# Patient Record
Sex: Female | Born: 1940 | Race: White | Hispanic: No | Smoking: Former smoker
Health system: Southern US, Community
[De-identification: ages and names within clinical notes are randomized; demographics above are authoritative.]

## PROBLEM LIST (undated history)

## (undated) DIAGNOSIS — F32A Depression, unspecified: Secondary | ICD-10-CM

## (undated) DIAGNOSIS — E785 Hyperlipidemia, unspecified: Secondary | ICD-10-CM

## (undated) DIAGNOSIS — M810 Age-related osteoporosis without current pathological fracture: Secondary | ICD-10-CM

## (undated) DIAGNOSIS — F329 Major depressive disorder, single episode, unspecified: Secondary | ICD-10-CM

## (undated) HISTORY — PX: ROTATOR CUFF REPAIR: SHX139

## (undated) HISTORY — PX: KNEE SURGERY: SHX244

## (undated) HISTORY — PX: BREAST SURGERY: SHX581

## (undated) HISTORY — PX: ABDOMINAL HYSTERECTOMY: SHX81

---

## 2013-08-31 ENCOUNTER — Emergency Department (HOSPITAL_COMMUNITY): Payer: Medicare Other

## 2013-08-31 ENCOUNTER — Emergency Department (HOSPITAL_COMMUNITY)
Admission: EM | Admit: 2013-08-31 | Discharge: 2013-08-31 | Disposition: A | Discharge status: Home / Self Care | Payer: Medicare Other | Attending: Emergency Medicine | Admitting: Emergency Medicine

## 2013-08-31 ENCOUNTER — Encounter (HOSPITAL_COMMUNITY): Payer: Self-pay | Admitting: Emergency Medicine

## 2013-08-31 DIAGNOSIS — W1809XA Striking against other object with subsequent fall, initial encounter: Secondary | ICD-10-CM | POA: Insufficient documentation

## 2013-08-31 DIAGNOSIS — M810 Age-related osteoporosis without current pathological fracture: Secondary | ICD-10-CM | POA: Insufficient documentation

## 2013-08-31 DIAGNOSIS — F3289 Other specified depressive episodes: Secondary | ICD-10-CM | POA: Insufficient documentation

## 2013-08-31 DIAGNOSIS — S20219A Contusion of unspecified front wall of thorax, initial encounter: Secondary | ICD-10-CM | POA: Insufficient documentation

## 2013-08-31 DIAGNOSIS — E785 Hyperlipidemia, unspecified: Secondary | ICD-10-CM | POA: Insufficient documentation

## 2013-08-31 DIAGNOSIS — F329 Major depressive disorder, single episode, unspecified: Secondary | ICD-10-CM | POA: Insufficient documentation

## 2013-08-31 DIAGNOSIS — Y9389 Activity, other specified: Secondary | ICD-10-CM | POA: Insufficient documentation

## 2013-08-31 DIAGNOSIS — Y92009 Unspecified place in unspecified non-institutional (private) residence as the place of occurrence of the external cause: Secondary | ICD-10-CM | POA: Insufficient documentation

## 2013-08-31 DIAGNOSIS — Z87891 Personal history of nicotine dependence: Secondary | ICD-10-CM | POA: Insufficient documentation

## 2013-08-31 DIAGNOSIS — S20212A Contusion of left front wall of thorax, initial encounter: Secondary | ICD-10-CM

## 2013-08-31 DIAGNOSIS — W010XXA Fall on same level from slipping, tripping and stumbling without subsequent striking against object, initial encounter: Secondary | ICD-10-CM | POA: Insufficient documentation

## 2013-08-31 HISTORY — DX: Age-related osteoporosis without current pathological fracture: M81.0

## 2013-08-31 HISTORY — DX: Depression, unspecified: F32.A

## 2013-08-31 HISTORY — DX: Hyperlipidemia, unspecified: E78.5

## 2013-08-31 HISTORY — DX: Major depressive disorder, single episode, unspecified: F32.9

## 2013-08-31 MED ORDER — HYDROCODONE-ACETAMINOPHEN 5-325 MG PO TABS: 1.0000 | ORAL_TABLET | ORAL | Status: DC | PRN

## 2013-08-31 NOTE — ED Notes (Signed)
Pt from home c/o of side/rib pain on the left and shortness of breath after falling this past Friday.  Pt also has a bruise on the right upper arm.  When pt places pressure against the area pain in somewhat relieved.  Pt in NAD, A&O.

## 2013-08-31 NOTE — ED Provider Notes (Signed)
CSN: [id number]     Arrival date & time 08/31/13  4098 History   First MD Initiated Contact with Patient 08/31/13 0746     Chief Complaint  Patient presents with  . Flank Pain   (Consider location/radiation/quality/duration/timing/severity/associated sxs/prior Treatment) HPI Comments: Patient presents to the ER with complaints of left rib pain. Patient reports that she fell on her deck 2 days ago. Patient reports that she tripped over a clothes, no head injury or loss of consciousness. Patient says that she hit her left chest wall area on a flower pot. She has had sharp pain in the area ever since. She reports that it hurts worse when she breathes or coughs. Stabilizing the area with pressure with her hands sometimes helps the pain. No neck or back pain. She is not short of breath, although pain is limiting her breathing effort.  Patient is a 72 y.o. female presenting with flank pain.  Flank Pain    Past Medical History  Diagnosis Date  . Hyperlipemia   . Osteoporosis   . Depression    Past Surgical History  Procedure Laterality Date  . Abdominal hysterectomy    . Breast surgery     History reviewed. No pertinent family history. History  Substance Use Topics  . Smoking status: Former Smoker    Quit date: 02/28/2013  . Smokeless tobacco: Never Used  . Alcohol Use: No   OB History   Grav Para Term Preterm Abortions TAB SAB Ect Mult Living                 Review of Systems  Musculoskeletal:       Rib pain    Allergies  Review of patient's allergies indicates no known allergies.  Home Medications   Current Outpatient Rx  Name  Route  Sig  Dispense  Refill  . atorvastatin (LIPITOR) 40 MG tablet               . citalopram (CELEXA) 20 MG tablet                BP 168/79  Pulse 68  Temp(Src) 98 F (36.7 C) (Oral)  Resp 24  SpO2 100% Physical Exam  Constitutional: She is oriented to person, place, and time. She appears well-developed and well-nourished. No  distress.  HENT:  Head: Normocephalic and atraumatic.  Right Ear: Hearing normal.  Left Ear: Hearing normal.  Nose: Nose normal.  Mouth/Throat: Oropharynx is clear and moist and mucous membranes are normal.  Eyes: Conjunctivae and EOM are normal. Pupils are equal, round, and reactive to light.  Neck: Normal range of motion. Neck supple.  Cardiovascular: Regular rhythm, S1 normal and S2 normal.  Exam reveals no gallop and no friction rub.   No murmur heard. Pulmonary/Chest: Effort normal and breath sounds normal. No respiratory distress. She exhibits no tenderness.    Abdominal: Soft. Normal appearance and bowel sounds are normal. There is no hepatosplenomegaly. There is no tenderness. There is no rebound, no guarding, no tenderness at McBurney's point and negative Murphy's sign. No hernia.  Musculoskeletal: Normal range of motion.       Right shoulder: She exhibits normal range of motion, no swelling and no deformity.       Right hip: Normal.       Left hip: Normal.       Cervical back: Normal.       Thoracic back: Normal.       Lumbar back: Normal.  Arms: Neurological: She is alert and oriented to person, place, and time. She has normal strength. No cranial nerve deficit or sensory deficit. Coordination normal. GCS eye subscore is 4. GCS verbal subscore is 5. GCS motor subscore is 6.  Skin: Skin is warm, dry and intact. Bruising noted. No rash noted. No cyanosis.     Psychiatric: She has a normal mood and affect. Her speech is normal and behavior is normal. Thought content normal.    ED Course  Procedures (including critical care time) Labs Review Labs Reviewed - No data to display Imaging Review Dg Ribs Unilateral W/chest Left  08/31/2013   CLINICAL DATA:  Right rib injury.  , a worsening pain.  EXAM: LEFT RIBS AND CHEST - 3+ VIEW  COMPARISON:  None.  FINDINGS: No fracture or other bone lesions are seen involving the ribs. There is no evidence of pneumothorax or pleural  effusion. Both lungs are clear. Heart size and mediastinal contours are within normal limits.  IMPRESSION: No acute osseous injury of the left ribs.   Electronically Signed   By: Elige Ko   On: 08/31/2013 08:35    EKG Interpretation   None       MDM  Diagnosis: Chest wall contusion  Patient presents to the ER for evaluation of pain in the left ribs after a fall. Patient's lungs are clear, no concern for pneumonia or pneumothorax. X-ray of the ribs and chest likewise was unremarkable. Symptoms consistent with chest wall contusion. Patient given instructions and precautions per contusion, including return for fever, productive cough or shortness of breath. Patient prescribed Vicodin for pain control.    Gilda Crease, MD 08/31/13 [phone]

## 2014-04-04 ENCOUNTER — Emergency Department (HOSPITAL_BASED_OUTPATIENT_CLINIC_OR_DEPARTMENT_OTHER): Payer: Medicare Other

## 2014-04-04 ENCOUNTER — Encounter (HOSPITAL_BASED_OUTPATIENT_CLINIC_OR_DEPARTMENT_OTHER): Payer: Self-pay | Admitting: Emergency Medicine

## 2014-04-04 ENCOUNTER — Emergency Department (HOSPITAL_BASED_OUTPATIENT_CLINIC_OR_DEPARTMENT_OTHER)
Admission: EM | Admit: 2014-04-04 | Discharge: 2014-04-04 | Disposition: A | Discharge status: Home / Self Care | Payer: Medicare Other | Attending: Emergency Medicine | Admitting: Emergency Medicine

## 2014-04-04 DIAGNOSIS — F329 Major depressive disorder, single episode, unspecified: Secondary | ICD-10-CM | POA: Insufficient documentation

## 2014-04-04 DIAGNOSIS — Z79899 Other long term (current) drug therapy: Secondary | ICD-10-CM | POA: Insufficient documentation

## 2014-04-04 DIAGNOSIS — M810 Age-related osteoporosis without current pathological fracture: Secondary | ICD-10-CM | POA: Insufficient documentation

## 2014-04-04 DIAGNOSIS — E785 Hyperlipidemia, unspecified: Secondary | ICD-10-CM | POA: Insufficient documentation

## 2014-04-04 DIAGNOSIS — F3289 Other specified depressive episodes: Secondary | ICD-10-CM | POA: Insufficient documentation

## 2014-04-04 DIAGNOSIS — Z87891 Personal history of nicotine dependence: Secondary | ICD-10-CM | POA: Insufficient documentation

## 2014-04-04 DIAGNOSIS — R42 Dizziness and giddiness: Secondary | ICD-10-CM

## 2014-04-04 LAB — CBC
HCT: 33.8 % — ABNORMAL LOW (ref 36.0–46.0)
Hemoglobin: 11.6 g/dL — ABNORMAL LOW (ref 12.0–15.0)
MCH: 29.3 pg (ref 26.0–34.0)
MCHC: 34.3 g/dL (ref 30.0–36.0)
MCV: 85.4 fL (ref 78.0–100.0)
Platelets: 288 K/uL (ref 150–400)
RBC: 3.96 MIL/uL (ref 3.87–5.11)
RDW: 13.1 % (ref 11.5–15.5)
WBC: 7.2 K/uL (ref 4.0–10.5)

## 2014-04-04 LAB — BASIC METABOLIC PANEL WITH GFR
BUN: 19 mg/dL (ref 6–23)
CO2: 25 meq/L (ref 19–32)
Calcium: 9.8 mg/dL (ref 8.4–10.5)
Chloride: 98 meq/L (ref 96–112)
Creatinine, Ser: 1 mg/dL (ref 0.50–1.10)
GFR calc Af Amer: 64 mL/min — ABNORMAL LOW
GFR calc non Af Amer: 55 mL/min — ABNORMAL LOW
Glucose, Bld: 126 mg/dL — ABNORMAL HIGH (ref 70–99)
Potassium: 4.4 meq/L (ref 3.7–5.3)
Sodium: 135 meq/L — ABNORMAL LOW (ref 137–147)

## 2014-04-04 MED ORDER — DIAZEPAM 5 MG PO TABS
5.0000 mg | ORAL_TABLET | Freq: Once | ORAL | Status: AC
  Administered 2014-04-04: 5 mg via ORAL
  Filled 2014-04-04: qty 1

## 2014-04-04 MED ORDER — DIAZEPAM 5 MG PO TABS: 5.0000 mg | ORAL_TABLET | Freq: Four times a day (QID) | ORAL | Status: DC | PRN

## 2014-04-04 MED ORDER — MECLIZINE HCL 25 MG PO TABS: 25.0000 mg | ORAL_TABLET | Freq: Three times a day (TID) | ORAL | Status: AC | PRN

## 2014-04-04 MED ORDER — MECLIZINE HCL 25 MG PO TABS
25.0000 mg | ORAL_TABLET | Freq: Once | ORAL | Status: AC
  Administered 2014-04-04: 25 mg via ORAL
  Filled 2014-04-04: qty 1

## 2014-04-04 NOTE — ED Notes (Signed)
D/c home with family- rx x 2 given for meclizine and valium

## 2014-04-04 NOTE — ED Notes (Addendum)
Patient was treated first of month for sinusitis and for the past 4 days has had increasing dizziness with nausea and room spinning. Has had vertigo in past and started meclizine with minimal relief. This am became weak, nausea, diaphoretic and extreme dizziness. Alert and oriented on arrival. Has been taking phentermine for 2 weeks due to weight gain from quitting smoking

## 2014-04-04 NOTE — Discharge Instructions (Signed)
Meclizine as needed for dizziness.  If the meclizine is not working or is ineffective, fill the prescription for Valium and try this instead.  Return to the emergency department if your symptoms substantially worsen or change.   Benign Positional Vertigo Vertigo means you feel like you or your surroundings are moving when they are not. Benign positional vertigo is the most common form of vertigo. Benign means that the cause of your condition is not serious. Benign positional vertigo is more common in older adults. CAUSES  Benign positional vertigo is the result of an upset in the labyrinth system. This is an area in the middle ear that helps control your balance. This may be caused by a viral infection, head injury, or repetitive motion. However, often no specific cause is found. SYMPTOMS  Symptoms of benign positional vertigo occur when you move your head or eyes in different directions. Some of the symptoms may include:  Loss of balance and falls.  Vomiting.  Blurred vision.  Dizziness.  Nausea.  Involuntary eye movements (nystagmus). DIAGNOSIS  Benign positional vertigo is usually diagnosed by physical exam. If the specific cause of your benign positional vertigo is unknown, your caregiver may perform imaging tests, such as magnetic resonance imaging (MRI) or computed tomography (CT). TREATMENT  Your caregiver may recommend movements or procedures to correct the benign positional vertigo. Medicines such as meclizine, benzodiazepines, and medicines for nausea may be used to treat your symptoms. In rare cases, if your symptoms are caused by certain conditions that affect the inner ear, you may need surgery. HOME CARE INSTRUCTIONS   Follow your caregiver's instructions.  Move slowly. Do not make sudden body or head movements.  Avoid driving.  Avoid operating heavy machinery.  Avoid performing any tasks that would be dangerous to you or others during a vertigo episode.  Drink  enough fluids to keep your urine clear or pale yellow. SEEK IMMEDIATE MEDICAL CARE IF:   You develop problems with walking, weakness, numbness, or using your arms, hands, or legs.  You have difficulty speaking.  You develop severe headaches.  Your nausea or vomiting continues or gets worse.  You develop visual changes.  Your family or friends notice any behavioral changes.  Your condition gets worse.  You have a fever.  You develop a stiff neck or sensitivity to light. MAKE SURE YOU:   Understand these instructions.  Will watch your condition.  Will get help right away if you are not doing well or get worse. Document Released: 07/17/2006 Document Revised: 01/01/2012 Document Reviewed: 06/29/2011 [facility] Patient Information 2014 [employer], [place].

## 2014-04-04 NOTE — ED Provider Notes (Addendum)
CSN: [id number]     Arrival date & time 04/04/14  1222 History   First MD Initiated Contact with Patient 04/04/14 1224     Chief Complaint  Patient presents with  . Dizziness      HPI Patient's been having dizziness over the past 24-48 hours.  This morning her dizziness described as the room spinning seemed to get worse.  She developed nausea and she developed one episode of vomiting.  No fevers or chills.  No weakness of her arms or legs.  No prior history of stroke.  She does have a history of hyperlipidemia.  She's used to smoke cigarettes but no longer does.  Her symptoms are mild to moderate in severity.   Past Medical History  Diagnosis Date  . Hyperlipemia   . Osteoporosis   . Depression    Past Surgical History  Procedure Laterality Date  . Abdominal hysterectomy    . Breast surgery     No family history on file. History  Substance Use Topics  . Smoking status: Former Smoker    Quit date: 02/28/2013  . Smokeless tobacco: Never Used  . Alcohol Use: No   OB History   Grav Para Term Preterm Abortions TAB SAB Ect Mult Living                 Review of Systems  All other systems reviewed and are negative.     Allergies  Review of patient's allergies indicates no known allergies.  Home Medications   Prior to Admission medications   Medication Sig Start Date End Date Taking? Authorizing Provider  phentermine 37.5 MG capsule Take 37.5 mg by mouth every morning.   Yes Historical Provider, MD  atorvastatin (LIPITOR) 40 MG tablet Take 40 mg by mouth every evening.  08/06/13   Historical Provider, MD  calcium citrate-vitamin D (CITRACAL+D) 315-200 MG-UNIT per tablet Take 3 tablets by mouth daily.    Historical Provider, MD  citalopram (CELEXA) 20 MG tablet Take 30 mg by mouth daily.  08/06/13   Historical Provider, MD  fish oil-omega-3 fatty acids 1000 MG capsule Take 2 g by mouth daily.    Historical Provider, MD  ibuprofen (ADVIL,MOTRIN) 200 MG tablet Take 200 mg  by mouth every 6 (six) hours as needed for mild pain or moderate pain.    Historical Provider, MD  Multiple Vitamin (MULTIVITAMIN WITH MINERALS) TABS tablet Take 1 tablet by mouth daily.    Historical Provider, MD   BP 109/55  Pulse 73  Temp(Src) 97.9 F (36.6 C)  Resp 18  Wt 150 lb (68.04 kg)  SpO2 100% Physical Exam  Nursing note and vitals reviewed. Constitutional: She is oriented to person, place, and time. She appears well-developed and well-nourished. No distress.  HENT:  Head: Normocephalic and atraumatic.  Eyes: EOM are normal.  Neck: Normal range of motion.  Cardiovascular: Normal rate, regular rhythm and normal heart sounds.   Pulmonary/Chest: Effort normal and breath sounds normal.  Abdominal: Soft. She exhibits no distension. There is no tenderness.  Musculoskeletal: Normal range of motion.  Neurological: She is alert and oriented to person, place, and time.  5 out of 5 strength in bilateral upper lower extremity major muscle groups.  Ataxic gait  Skin: Skin is warm and dry.  Psychiatric: She has a normal mood and affect. Judgment normal.    ED Course  Procedures (including critical care time) Labs Review Labs Reviewed  CBC  BASIC METABOLIC PANEL    Imaging Review  No results found.  ECG interpretation   Date: 04/04/2014  Rate: 64  Rhythm: normal sinus rhythm  QRS Axis: normal  Intervals: normal  ST/T Wave abnormalities: normal  Conduction Disutrbances: none  Narrative Interpretation:   Old EKG Reviewed: No significant changes noted     MDM   Final diagnoses:  None    Patient is feeling slightly better after Valium however when I went to stand the patient and she attempted to wash in significant ataxia.  Patient will undergo MR brain to evaluate for posterior stroke.  Labs ordered.  EKG will be ordered.  2:57 PM Care to oncoming physician  Lyanne CoKevin M Ximenna Fonseca, MD 04/04/14 1457  Lyanne CoKevin M Adalai Perl, MD 04/04/14 [phone]

## 2014-04-04 NOTE — ED Notes (Signed)
Patient transported to MRI 

## 2014-04-04 NOTE — ED Provider Notes (Signed)
Patient is a 73 year old female who presents with vertigo. Care signed out to me at shift change from Dr. Patria Maneampos awaiting results of blood work and an MRI. Laboratory studies are unremarkable and MRI reveals no sign of acute CVA. She is feeling better with medications given in the ER. She had a similar episode last year which the meclizine helped. She had some left over from this visit which did not seem to help today. I will give her a fresh prescription for meclizine and will also prescribe a few Valium as this seemed to work here today. She is to followup if not improving and return if her symptoms worsen or change.  Geoffery Lyonsouglas Deicy Rusk, MD 04/04/14 [phone]

## 2014-04-04 NOTE — ED Notes (Signed)
Patient evaluated for stroke swallow screen at arrival, prior to drinking fluids or taking medication. Patient able to drink without and with straw and eat crackers without change in lung sounds @ 12:35pm

## 2014-06-08 DIAGNOSIS — M1711 Unilateral primary osteoarthritis, right knee: Secondary | ICD-10-CM | POA: Insufficient documentation

## 2014-08-04 DIAGNOSIS — J42 Unspecified chronic bronchitis: Secondary | ICD-10-CM

## 2014-08-04 DIAGNOSIS — E782 Mixed hyperlipidemia: Secondary | ICD-10-CM | POA: Insufficient documentation

## 2014-08-04 DIAGNOSIS — M810 Age-related osteoporosis without current pathological fracture: Secondary | ICD-10-CM | POA: Insufficient documentation

## 2014-08-04 DIAGNOSIS — E8940 Asymptomatic postprocedural ovarian failure: Secondary | ICD-10-CM

## 2014-08-04 DIAGNOSIS — R920 Mammographic microcalcification found on diagnostic imaging of breast: Secondary | ICD-10-CM | POA: Insufficient documentation

## 2014-08-04 DIAGNOSIS — E785 Hyperlipidemia, unspecified: Secondary | ICD-10-CM | POA: Insufficient documentation

## 2014-08-04 DIAGNOSIS — H9110 Presbycusis, unspecified ear: Secondary | ICD-10-CM | POA: Insufficient documentation

## 2014-08-04 DIAGNOSIS — E7800 Pure hypercholesterolemia, unspecified: Secondary | ICD-10-CM | POA: Insufficient documentation

## 2014-08-04 DIAGNOSIS — I350 Nonrheumatic aortic (valve) stenosis: Secondary | ICD-10-CM | POA: Insufficient documentation

## 2014-08-04 HISTORY — DX: Asymptomatic postprocedural ovarian failure: E89.40

## 2014-08-04 HISTORY — DX: Unspecified chronic bronchitis: J42

## 2014-08-04 HISTORY — DX: Mammographic microcalcification found on diagnostic imaging of breast: R92.0

## 2015-12-02 DIAGNOSIS — M19072 Primary osteoarthritis, left ankle and foot: Secondary | ICD-10-CM | POA: Insufficient documentation

## 2016-01-10 DIAGNOSIS — M19079 Primary osteoarthritis, unspecified ankle and foot: Secondary | ICD-10-CM

## 2016-01-10 HISTORY — DX: Primary osteoarthritis, unspecified ankle and foot: M19.079

## 2016-04-18 DIAGNOSIS — M76819 Anterior tibial syndrome, unspecified leg: Secondary | ICD-10-CM

## 2016-04-18 HISTORY — DX: Anterior tibial syndrome, unspecified leg: M76.819

## 2017-01-20 DIAGNOSIS — S42211A Unspecified displaced fracture of surgical neck of right humerus, initial encounter for closed fracture: Secondary | ICD-10-CM | POA: Insufficient documentation

## 2017-01-20 DIAGNOSIS — W19XXXA Unspecified fall, initial encounter: Secondary | ICD-10-CM | POA: Insufficient documentation

## 2017-01-20 HISTORY — DX: Unspecified fall, initial encounter: W19.XXXA

## 2017-01-20 HISTORY — DX: Unspecified displaced fracture of surgical neck of right humerus, initial encounter for closed fracture: S42.211A

## 2017-02-13 ENCOUNTER — Encounter: Payer: Self-pay | Admitting: Physical Therapy

## 2017-02-13 ENCOUNTER — Ambulatory Visit: Payer: Medicare Other | Attending: Sports Medicine | Admitting: Physical Therapy

## 2017-02-13 DIAGNOSIS — M25611 Stiffness of right shoulder, not elsewhere classified: Secondary | ICD-10-CM

## 2017-02-13 DIAGNOSIS — R252 Cramp and spasm: Secondary | ICD-10-CM | POA: Diagnosis present

## 2017-02-13 DIAGNOSIS — M25511 Pain in right shoulder: Secondary | ICD-10-CM

## 2017-02-13 NOTE — Therapy (Signed)
[facility] [place] [address] [place], [place], [address] Phone: [phone]   Fax:  [phone]  Physical Therapy Evaluation  Patient Details  Name: Kim Grant MRN: [id number] Date of Birth: [birth year] Referring Provider: Leonel Ramsay  Encounter Date: 02/13/2017      PT End of Session - 02/13/17 1149    Visit Number 1   Date for PT Re-Evaluation 04/15/17   PT Start Time 1100   PT Stop Time 1155   PT Time Calculation (min) 55 min   Activity Tolerance Patient tolerated treatment well   Behavior During Therapy [facility] for tasks assessed/performed;Anxious      Past Medical History:  Diagnosis Date  . Depression   . Hyperlipemia   . Osteoporosis     Past Surgical History:  Procedure Laterality Date  . ABDOMINAL HYSTERECTOMY    . BREAST SURGERY      There were no vitals filed for this visit.       Subjective Assessment - 02/13/17 1100    Subjective Patient reports that she fell on 01/20/17.  She sustained a right proximal humerus fracture.  She reports tripping on a step chasing a dog.  She has been in a sling since that time, the MD reports  that there is good alignment but not much bone healing as of yet.   Limitations Lifting;House hold activities;Writing   Patient Stated Goals have better motions and less pain   Currently in Pain? Yes   Pain Score 3    Pain Location Shoulder   Pain Orientation Right   Pain Descriptors / Indicators Aching;Dull   Pain Type Acute pain   Pain Onset 1 to 4 weeks ago   Pain Frequency Constant   Aggravating Factors  movements, dependent position, trying to do hair and get dressed pain up to 6/10   Pain Relieving Factors sling, rest   Effect of Pain on Daily Activities difficulty with ADL's            [facility] PT Assessment - 02/13/17 0001      Assessment   Medical Diagnosis right proximal humerus fracture   Referring Provider Leonel Ramsay   Onset Date/Surgical Date 01/20/17   Hand  Dominance Right   Prior Therapy none     Precautions   Precautions None     Balance Screen   Has the patient fallen in the past 6 months Yes   How many times? 1   Has the patient had a decrease in activity level because of a fear of falling?  No   Is the patient reluctant to leave their home because of a fear of falling?  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, ED registration associate   Leisure no exercise     Posture/Postural Control   Posture Comments wearing sling, hold right arm up and into the body, some elevation of the shoulders     ROM / Strength   AROM / PROM / Strength AROM;PROM;Strength     PROM   Overall PROM Comments right elbow, wrist and forearm    PROM Assessment Site Shoulder;Elbow   Right/Left Shoulder Right   Right Shoulder Flexion 80 Degrees   Right Shoulder ABduction 80 Degrees  Right Shoulder Internal Rotation 45 Degrees   Right Shoulder External Rotation 20 Degrees   Right/Left Elbow Right   Right Elbow Flexion 140   Right Elbow Extension 50     Palpation   Palpation comment tight in the upper traps, rhomboids and neck area from gaurding, very tight and tender in the right biceps due to being in the sling the past 3 weeks                           PT Education - 02/13/17 1148    Education provided Yes   Education Details shoulder shrugs, scapular retraction, elbow extension, lap and table slides for flexion and ER   Person(s) Educated Patient   Methods Explanation;Demonstration;Verbal cues;Handout;Tactile cues   Comprehension Returned demonstration;Verbal cues required;Tactile cues required;Verbalized understanding          PT Short Term Goals - 02/13/17 1154      PT SHORT TERM GOAL #1   Title  independent with initial HEP   Time 2   Period Weeks   Status New           PT Long Term Goals - 02/13/17 1154      PT LONG TERM GOAL #1   Title increase AROM of right shoulder flexion to 130 degrees   Time 8   Period Weeks   Status New     PT LONG TERM GOAL #2   Title increase ER of the right shoulder to 70 degrees   Time 8   Period Weeks   Status New     PT LONG TERM GOAL #3   Title decrease pain 50%   Time 8   Period Weeks   Status New     PT LONG TERM GOAL #4   Title dress and do hair without difficulty   Time 8   Period Weeks   Status New     PT LONG TERM GOAL #5   Title lift 3# into head high cabinet   Time 8   Period Weeks   Status New               Plan - 02/13/17 1150    Clinical Impression Statement Patient had a fall on 01/20/17, she sustained a right proximal humerus fracture.  She has been in a sling since that time, the MD note reports that there is not good bone healing yet.  She is very stiff and gaurded.  HEr PROM was 80 degrees for flexion and abduction, 30 degrees IR and 20 degrees ER.  Her elbow is 50 degrees from full extension.  She would like to return to her part time job in the future.  She is right handed   Rehab Potential Good   PT Frequency 2x / week   PT Duration 8 weeks   PT Treatment/Interventions ADLs/Self Care Home Management;Electrical Stimulation;Cryotherapy;Moist Heat;Therapeutic activities;Therapeutic exercise;Patient/family education;Passive range of motion;Manual techniques   PT Next Visit Plan slowly add PROM/ AAROM, she may be seeing the MD next week   Consulted and Agree with Plan of Care Patient      Patient will benefit from skilled therapeutic intervention in order to improve the following deficits and impairments:  Decreased strength, Increased edema, Decreased range of motion, Increased muscle spasms, Impaired flexibility, Postural dysfunction, Pain  Visit Diagnosis: Acute pain of right shoulder - Plan: PT  plan of care cert/re-cert  Stiffness of right shoulder, not elsewhere classified - Plan:  PT plan of care cert/re-cert  Cramp and spasm - Plan: PT plan of care cert/re-cert      G-Codes - 2017-02-18 1158    Functional Assessment Tool Used (Outpatient Only) foto 80% limitation   Functional Limitation Carrying, moving and handling objects   Carrying, Moving and Handling Objects Current Status (Z6109) At least 80 percent but less than 100 percent impaired, limited or restricted   Carrying, Moving and Handling Objects Goal Status (U0454) At least 40 percent but less than 60 percent impaired, limited or restricted       Problem List There are no active problems to display for this patient.   Jearld Lesch., PT 02-18-2017, 12:00 PM  [facility] [place] [address] [place], [place], [address] Phone: [phone]   Fax:  [phone]  Name: Soliana Kitko MRN: [id number] Date of Birth: 1941-03-03

## 2017-02-21 ENCOUNTER — Ambulatory Visit: Payer: Medicare Other | Attending: Sports Medicine | Admitting: Physical Therapy

## 2017-02-21 ENCOUNTER — Encounter: Payer: Self-pay | Admitting: Physical Therapy

## 2017-02-21 DIAGNOSIS — R252 Cramp and spasm: Secondary | ICD-10-CM | POA: Insufficient documentation

## 2017-02-21 DIAGNOSIS — M25611 Stiffness of right shoulder, not elsewhere classified: Secondary | ICD-10-CM | POA: Diagnosis present

## 2017-02-21 DIAGNOSIS — M25511 Pain in right shoulder: Secondary | ICD-10-CM | POA: Diagnosis not present

## 2017-02-21 NOTE — Therapy (Signed)
[facility] [place] [address] [place], [place], [address] Phone: [phone]   Fax:  [phone]  Physical Therapy Treatment  Patient Details  Name: Sissy Goetzke MRN: [id number] Date of Birth: [birth year] Referring Provider: Leonel Ramsay  Encounter Date: 02/21/2017    Past Medical History:  Diagnosis Date  . Depression   . Hyperlipemia   . Osteoporosis     Past Surgical History:  Procedure Laterality Date  . ABDOMINAL HYSTERECTOMY    . BREAST SURGERY      There were no vitals filed for this visit.      Subjective Assessment - 02/21/17 1109    Subjective Pt reports that she does not sit around at home. PT reports that she has been doing her ADLs to the best of her abilities. Pt reports that she thinks she may have over done it recently.    Currently in Pain? Yes   Pain Score 4    Pain Location Shoulder   Pain Orientation Right   Pain Descriptors / Indicators Aching                         [facility] Adult PT Treatment/Exercise - 02/21/17 0001      Exercises   Exercises Shoulder     Shoulder Exercises: Supine   Flexion AROM;Both;10 reps  cane     Shoulder Exercises: Standing   Internal Rotation 15 reps;AAROM  cane, up back   Flexion AAROM;10 reps  cane   Extension 15 reps;AAROM  cane   Other Standing Exercises Ball on table flex, CW/CCW x10     Modalities   Modalities Electrical Stimulation;Moist Heat     Moist Heat Therapy   Number Minutes Moist Heat 15 Minutes   Moist Heat Location Shoulder     Electrical Stimulation   Electrical Stimulation Location R shoulder   Electrical Stimulation Action IFC   Electrical Stimulation Parameters sitting to pt tolerance   Electrical Stimulation Goals Pain     Manual Therapy   Manual Therapy Passive ROM                  PT Short Term Goals - 02/13/17 1154      PT SHORT TERM GOAL #1   Title independent with initial HEP   Time 2   Period Weeks   Status New           PT Long Term Goals - 02/13/17 1154      PT LONG TERM GOAL #1   Title increase AROM of right shoulder flexion to 130 degrees   Time 8   Period Weeks   Status New     PT LONG TERM GOAL #2   Title increase ER of the right shoulder to 70 degrees   Time 8   Period Weeks   Status New     PT LONG TERM GOAL #3   Title decrease pain 50%   Time 8   Period Weeks   Status New     PT LONG TERM GOAL #4   Title dress and do hair without difficulty   Time 8   Period Weeks   Status New     PT LONG TERM GOAL #5   Title lift 3# into head high cabinet   Time 8   Period Weeks   Status New               Plan - 02/21/17 1150  Clinical Impression Statement Pt progressed pt to some AAROM interventions with ball. Pt nervous and guarded initially but able to overcome quickly. does reports some pain at end range. Pt does gain better ROM with AAROM in supine. Pt does report some pain with MT at end range, all motions with soft end point. Pt could only tolerated 13 minutes of heat and e-Stim reports that it made her arm feel heave, but once it was removed she reported that it felt better.   Rehab Potential Good   PT Frequency 2x / week   PT Duration 8 weeks   PT Treatment/Interventions ADLs/Self Care Home Management;Electrical Stimulation;Cryotherapy;Moist Heat;Therapeutic activities;Therapeutic exercise;Patient/family education;Passive range of motion;Manual techniques   PT Next Visit Plan slowly add PROM/ AAROM,      Patient will benefit from skilled therapeutic intervention in order to improve the following deficits and impairments:  Decreased strength, Increased edema, Decreased range of motion, Increased muscle spasms, Impaired flexibility, Postural dysfunction, Pain  Visit Diagnosis: Acute pain of right shoulder  Stiffness of right shoulder, not elsewhere classified  Cramp and spasm     Problem List There are no active problems to  display for this patient.   Grayce Sessions, PTA 02/21/2017, 11:56 AM  [facility]- [address] [place] [address] [place], [place], [address] Phone: [phone]   Fax:  [phone]  Name: Khrista Braun MRN: [id number] Date of Birth: 04/29/1941

## 2017-02-28 ENCOUNTER — Ambulatory Visit: Payer: Medicare Other | Admitting: Physical Therapy

## 2017-02-28 ENCOUNTER — Encounter: Payer: Self-pay | Admitting: Physical Therapy

## 2017-02-28 DIAGNOSIS — M25511 Pain in right shoulder: Secondary | ICD-10-CM

## 2017-02-28 DIAGNOSIS — M25611 Stiffness of right shoulder, not elsewhere classified: Secondary | ICD-10-CM

## 2017-02-28 DIAGNOSIS — R252 Cramp and spasm: Secondary | ICD-10-CM

## 2017-02-28 NOTE — Therapy (Signed)
[facility] [address] [place], [place], [address] Phone: [phone]   Fax:  [phone]  Physical Therapy Treatment  Patient Details  Name: Kim Grant General MRN: [id number] Date of Birth: Sep 08, 1941 Referring Provider: Leonel RamsayFelder  Encounter Date: 02/28/2017      PT End of Session - 02/28/17 1431    Visit Number 2   Date for PT Re-Evaluation 04/15/17   PT Start Time 1345   PT Stop Time 1442   PT Time Calculation (min) 57 min   Activity Tolerance Patient tolerated treatment well   Behavior During Therapy [facility] for tasks assessed/performed;Anxious      Past Medical History:  Diagnosis Date  . Depression   . Hyperlipemia   . Osteoporosis     Past Surgical History:  Procedure Laterality Date  . ABDOMINAL HYSTERECTOMY    . BREAST SURGERY      There were no vitals filed for this visit.      Subjective Assessment - 02/28/17 1351    Subjective Pt reports that she has been ok   Currently in Pain? No/denies   Pain Score 0-No pain            [facility] PT Assessment - 02/28/17 0001      PROM   Overall PROM Comments she has a lot of crepitus Large pops when coming out of end range   Right Shoulder Flexion 120 Degrees   Right Shoulder ABduction 102 Degrees   Right Shoulder Internal Rotation 50 Degrees   Right Shoulder External Rotation 40 Degrees   Right Elbow Flexion 140   Right Elbow Extension 15                     [facility] Adult PT Treatment/Exercise - 02/28/17 0001      Exercises   Exercises Shoulder     Shoulder Exercises: Standing   Internal Rotation Both;10 reps;AAROM  x2 cane   Flexion AAROM;10 reps  x2, cane   Extension AAROM;10 reps  x2, cane   Other Standing Exercises Ball on table flex, CW/CCW x10     Shoulder Exercises: ROM/Strengthening   Rebounder L1 493frd/3rev     Modalities   Modalities Electrical Stimulation;Moist Heat     Moist Heat Therapy   Number Minutes Moist Heat 15  Minutes   Moist Heat Location Shoulder     Electrical Stimulation   Electrical Stimulation Location R shoulder   Electrical Stimulation Action IFC   Electrical Stimulation Parameters sitting to pt tolerance   Electrical Stimulation Goals Pain     Manual Therapy   Manual Therapy Passive ROM   Manual therapy comments some PROM taken to end range and held   Passive ROM R shoulder                  PT Short Term Goals - 02/13/17 1154      PT SHORT TERM GOAL #1   Title independent with initial HEP   Time 2   Period Weeks   Status New           PT Long Term Goals - 02/28/17 1434      PT LONG TERM GOAL #1   Title increase AROM of right shoulder flexion to 130 degrees   Status On-going     PT LONG TERM GOAL #2   Title increase ER of the right shoulder to 70 degrees   Status On-going     PT LONG  TERM GOAL #4   Title dress and do hair without difficulty   Status On-going     PT LONG TERM GOAL #5   Title lift 3# into head high cabinet   Status On-going               Plan - 02/28/17 1435    Clinical Impression Statement Pt given cues throughout treatment to not to be go guarded with her RUE. Pt reports a stretch with all of todays interventions. Good PROM noted with MT, but does have some pain at end range.   Rehab Potential Good   PT Frequency 2x / week   PT Duration 8 weeks   PT Treatment/Interventions ADLs/Self Care Home Management;Electrical Stimulation;Cryotherapy;Moist Heat;Therapeutic activities;Therapeutic exercise;Patient/family education;Passive range of motion;Manual techniques   PT Next Visit Plan slowly add PROM/ AAROM,      Patient will benefit from skilled therapeutic intervention in order to improve the following deficits and impairments:  Decreased strength, Increased edema, Decreased range of motion, Increased muscle spasms, Impaired flexibility, Postural dysfunction, Pain  Visit Diagnosis: Acute pain of right shoulder  Stiffness  of right shoulder, not elsewhere classified  Cramp and spasm     Problem List There are no active problems to display for this patient.   Jearld Lesch., PT 02/28/2017, 2:48 PM  [facility] [place] [address] [place], [place], [address] Phone: [phone]   Fax:  [phone]  Name: Kim Grant MRN: [id number] Date of Birth: 08/31/1941

## 2017-02-28 NOTE — Therapy (Deleted)
[facility] [address] [place], [place], [address] Phone: [phone]   Fax:  [phone]  Physical Therapy Treatment  Patient Details  Name: Kim Grant MRN: [id number] Date of Birth: 1941/07/30 Referring Provider: Leonel RamsayFelder  Encounter Date: 02/28/2017      PT End of Session - 02/28/17 1431    Visit Number 2   Date for PT Re-Evaluation 04/15/17   PT Start Time 1345   PT Stop Time 1442   PT Time Calculation (min) 57 min   Activity Tolerance Patient tolerated treatment well   Behavior During Therapy [facility] for tasks assessed/performed;Anxious      Past Medical History:  Diagnosis Date  . Depression   . Hyperlipemia   . Osteoporosis     Past Surgical History:  Procedure Laterality Date  . ABDOMINAL HYSTERECTOMY    . BREAST SURGERY      There were no vitals filed for this visit.      Subjective Assessment - 02/28/17 1351    Subjective Pt reports that she has been ok   Currently in Pain? No/denies   Pain Score 0-No pain                         [facility] Adult PT Treatment/Exercise - 02/28/17 0001      Exercises   Exercises Shoulder     Shoulder Exercises: Standing   Internal Rotation Both;10 reps;AAROM  x2 cane   Flexion AAROM;10 reps  x2, cane   Extension AAROM;10 reps  x2, cane   Other Standing Exercises Ball on table flex, CW/CCW x10     Shoulder Exercises: ROM/Strengthening   Rebounder L1 533frd/3rev     Modalities   Modalities Electrical Stimulation;Moist Heat     Moist Heat Therapy   Number Minutes Moist Heat 15 Minutes   Moist Heat Location Shoulder     Electrical Stimulation   Electrical Stimulation Location R shoulder   Electrical Stimulation Action IFC   Electrical Stimulation Parameters sitting to pt tolerance   Electrical Stimulation Goals Pain     Manual Therapy   Manual Therapy Passive ROM   Manual therapy comments some PROM taken to end range and held   Passive ROM R shoulder                  PT Short Term Goals - 02/13/17 1154      PT SHORT TERM GOAL #1   Title independent with initial HEP   Time 2   Period Weeks   Status New           PT Long Term Goals - 02/28/17 1434      PT LONG TERM GOAL #1   Title increase AROM of right shoulder flexion to 130 degrees   Status On-going     PT LONG TERM GOAL #2   Title increase ER of the right shoulder to 70 degrees   Status On-going     PT LONG TERM GOAL #4   Title dress and do hair without difficulty   Status On-going     PT LONG TERM GOAL #5   Title lift 3# into head high cabinet   Status On-going               Plan - 02/28/17 1435    Clinical Impression Statement Pt given cues throughout treatment to not to be go guarded with her RUE. Pt reports a stretch with  all of today's interventions. Good PROM noted with MT, but does have some pain at end range.   Rehab Potential Good   PT Frequency 2x / week   PT Duration 8 weeks   PT Treatment/Interventions ADLs/Self Care Home Management;Electrical Stimulation;Cryotherapy;Moist Heat;Therapeutic activities;Therapeutic exercise;Patient/family education;Passive range of motion;Manual techniques   PT Next Visit Plan slowly add PROM/ AAROM,      Patient will benefit from skilled therapeutic intervention in order to improve the following deficits and impairments:  Decreased strength, Increased edema, Decreased range of motion, Increased muscle spasms, Impaired flexibility, Postural dysfunction, Pain  Visit Diagnosis: Acute pain of right shoulder  Stiffness of right shoulder, not elsewhere classified  Cramp and spasm     Problem List There are no active problems to display for this patient.   Grayce Sessions, PTA 02/28/2017, 2:37 PM  [facility] [place] [facility] [address] [place], [place], [address] Phone: [phone]   Fax:  [phone]  Name: Kim Grant MRN: [id number] Date of Birth: 07/25/1941

## 2017-03-05 ENCOUNTER — Encounter: Payer: Self-pay | Admitting: Physical Therapy

## 2017-03-05 ENCOUNTER — Ambulatory Visit: Payer: Medicare Other | Admitting: Physical Therapy

## 2017-03-05 DIAGNOSIS — M25511 Pain in right shoulder: Secondary | ICD-10-CM

## 2017-03-05 DIAGNOSIS — M25611 Stiffness of right shoulder, not elsewhere classified: Secondary | ICD-10-CM

## 2017-03-05 DIAGNOSIS — R252 Cramp and spasm: Secondary | ICD-10-CM

## 2017-03-05 NOTE — Therapy (Signed)
[facility] [address] [place], [place], [address] Phone: [phone]   Fax:  [phone]  Physical Therapy Treatment  Patient Details  Name: Kim Grant MRN: [id number] Date of Birth: 1941-08-07 Referring Provider: Leonel RamsayFelder  Encounter Date: 03/05/2017      PT End of Session - 03/05/17 1514    Visit Number 3   Date for PT Re-Evaluation 04/15/17   PT Start Time 1433   PT Stop Time 1528   PT Time Calculation (min) 55 min   Activity Tolerance Patient tolerated treatment well   Behavior During Therapy [facility] for tasks assessed/performed;Anxious      Past Medical History:  Diagnosis Date  . Depression   . Hyperlipemia   . Osteoporosis     Past Surgical History:  Procedure Laterality Date  . ABDOMINAL HYSTERECTOMY    . BREAST SURGERY      There were no vitals filed for this visit.      Subjective Assessment - 03/05/17 1439    Subjective Pt reports going to MD and x-rays didn't show much healing. Pt reports that the MD said she know it is healing because her arm and shoulder are moving together   Currently in Pain? Yes   Pain Score 5    Pain Location Shoulder   Pain Orientation Right                         [facility] Adult PT Treatment/Exercise - 03/05/17 0001      Exercises   Exercises Shoulder     Shoulder Exercises: Standing   Internal Rotation Both;AAROM;15 reps  up back with cane   Flexion AAROM;15 reps   Theraband Level (Shoulder Flexion) --  x2 with cane    Extension AAROM;15 reps  cane   Other Standing Exercises Ball on table flex, CW/CCW x15     Shoulder Exercises: ROM/Strengthening   Rebounder L1 413frd/3rev     Modalities   Modalities Electrical Stimulation;Moist Heat     Moist Heat Therapy   Number Minutes Moist Heat 15 Minutes   Moist Heat Location Shoulder     Electrical Stimulation   Electrical Stimulation Location R shoulder   Electrical Stimulation Action IFC    Electrical Stimulation Parameters sitting to pt tolerance   Electrical Stimulation Goals Pain     Manual Therapy   Manual Therapy Passive ROM   Manual therapy comments some PROM taken to end range and held   Passive ROM R shoulder                  PT Short Term Goals - 02/13/17 1154      PT SHORT TERM GOAL #1   Title independent with initial HEP   Time 2   Period Weeks   Status New           PT Long Term Goals - 03/05/17 1515      PT LONG TERM GOAL #1   Title increase AROM of right shoulder flexion to 130 degrees   Status On-going     PT LONG TERM GOAL #2   Title increase ER of the right shoulder to 70 degrees   Status On-going     PT LONG TERM GOAL #3   Title decrease pain 50%   Status On-going     PT LONG TERM GOAL #4   Title dress and do hair without difficulty   Status On-going  able  to brush teeth               Plan - 03/05/17 1516    Clinical Impression Statement Pt~ 5 minutes late for today's treatment. Pt give good effort with all AAROM exercises. Pt able to reach full PROM with all R shoulder motions except ER. Does report pain at end range with MT.   Rehab Potential Good   PT Frequency 2x / week   PT Duration 8 weeks   PT Treatment/Interventions ADLs/Self Care Home Management;Electrical Stimulation;Cryotherapy;Moist Heat;Therapeutic activities;Therapeutic exercise;Patient/family education;Passive range of motion;Manual techniques   PT Next Visit Plan slowly add PROM/ AAROM,      Patient will benefit from skilled therapeutic intervention in order to improve the following deficits and impairments:  Decreased strength, Increased edema, Decreased range of motion, Increased muscle spasms, Impaired flexibility, Postural dysfunction, Pain  Visit Diagnosis: Acute pain of right shoulder  Stiffness of right shoulder, not elsewhere classified  Cramp and spasm     Problem List There are no active problems to display for this  patient.   Grayce Sessions, PTA 03/05/2017, 3:18 PM  [facility] [place] [facility] [address] [place], [place], [address] Phone: [phone]   Fax:  [phone]  Name: Kim Grant MRN: [id number] Date of Birth: [birth year]

## 2017-03-08 ENCOUNTER — Ambulatory Visit: Payer: Medicare Other | Admitting: Physical Therapy

## 2017-03-08 ENCOUNTER — Encounter: Payer: Self-pay | Admitting: Physical Therapy

## 2017-03-08 DIAGNOSIS — R252 Cramp and spasm: Secondary | ICD-10-CM

## 2017-03-08 DIAGNOSIS — M25511 Pain in right shoulder: Secondary | ICD-10-CM | POA: Diagnosis not present

## 2017-03-08 DIAGNOSIS — M25611 Stiffness of right shoulder, not elsewhere classified: Secondary | ICD-10-CM

## 2017-03-08 NOTE — Therapy (Signed)
[facility] [place] [facility] [address] [place], [place], [address] Phone: [phone]   Fax:  [phone]  Physical Therapy Treatment  Patient Details  Name: Kim Grant MRN: [id number] Date of Birth: 1940-11-08 Referring Provider: Leonel Ramsay  Encounter Date: 03/08/2017      PT End of Session - 03/08/17 1512    Visit Number 4   Date for PT Re-Evaluation 04/15/17   PT Start Time 1435   PT Stop Time 1523   PT Time Calculation (min) 48 min   Activity Tolerance Patient tolerated treatment well   Behavior During Therapy [facility] for tasks assessed/performed;Anxious      Past Medical History:  Diagnosis Date  . Depression   . Hyperlipemia   . Osteoporosis     Past Surgical History:  Procedure Laterality Date  . ABDOMINAL HYSTERECTOMY    . BREAST SURGERY      There were no vitals filed for this visit.      Subjective Assessment - 03/08/17 1443    Subjective Pateint reports frustration with her arm, has pain in the right upper arm and the right upper trap.  she is very tight in the upper trap and the rhomboid   Currently in Pain? Yes   Pain Score 5    Pain Location Shoulder   Pain Orientation Right            [facility] PT Assessment - 03/08/17 0001      Palpation   Palpation comment still significant spasms and knots in the right upper trap and the rhomoids                     [facility] Adult PT Treatment/Exercise - 03/08/17 0001      Shoulder Exercises: Standing   Other Standing Exercises Ball on table flex, CW/CCW x15     Shoulder Exercises: ROM/Strengthening   Rebounder L1 29frd/3rev     Moist Heat Therapy   Number Minutes Moist Heat 15 Minutes   Moist Heat Location Shoulder     Electrical Stimulation   Electrical Stimulation Location right upper trap and rhomboid area   Electrical Stimulation Action IFC   Electrical Stimulation Parameters sitting   Electrical Stimulation Goals Pain     Manual Therapy    Manual Therapy Passive ROM   Manual therapy comments some PROM taken to end range and held   Passive ROM R shoulder                  PT Short Term Goals - 02/13/17 1154      PT SHORT TERM GOAL #1   Title independent with initial HEP   Time 2   Period Weeks   Status New           PT Long Term Goals - 03/08/17 1515      PT LONG TERM GOAL #1   Title increase AROM of right shoulder flexion to 130 degrees   Status On-going     PT LONG TERM GOAL #2   Title increase ER of the right shoulder to 70 degrees   Status On-going     PT LONG TERM GOAL #3   Title decrease pain 50%   Status On-going     PT LONG TERM GOAL #4   Title dress and do hair without difficulty   Status On-going     PT LONG TERM GOAL #5   Title lift 3# into head high cabinet  Status On-going               Plan - 03/08/17 1513    Clinical Impression Statement I looked at the MD note and he feels that the recent x-rays show some callus, still not healed but is healing slowly.  She is having significant spasms in the right upper trap and the rhomboids.  She c/o significant pain with active motions and with some palpation if the knots and in the right upper arm   PT Next Visit Plan slowly add PROM/ AAROM,   Consulted and Agree with Plan of Care Patient      Patient will benefit from skilled therapeutic intervention in order to improve the following deficits and impairments:  Decreased strength, Increased edema, Decreased range of motion, Increased muscle spasms, Impaired flexibility, Postural dysfunction, Pain  Visit Diagnosis: Acute pain of right shoulder  Stiffness of right shoulder, not elsewhere classified  Cramp and spasm     Problem List There are no active problems to display for this patient.   Jearld LeschALBRIGHT,MICHAEL W., PT 03/08/2017, 3:15 PM  [facility] [address] [place], [place], [address] Phone:  [phone]   Fax:  [phone]  Name: Kim Grant MRN: [id number] Date of Birth: 1941-03-04

## 2017-03-12 ENCOUNTER — Ambulatory Visit: Payer: Medicare Other | Admitting: Physical Therapy

## 2017-03-12 ENCOUNTER — Encounter: Payer: Self-pay | Admitting: Physical Therapy

## 2017-03-12 DIAGNOSIS — M25511 Pain in right shoulder: Secondary | ICD-10-CM | POA: Diagnosis not present

## 2017-03-12 DIAGNOSIS — M25611 Stiffness of right shoulder, not elsewhere classified: Secondary | ICD-10-CM

## 2017-03-12 DIAGNOSIS — R252 Cramp and spasm: Secondary | ICD-10-CM

## 2017-03-12 NOTE — Therapy (Signed)
[facility] [address] [place], [place], [address] Phone: [phone]   Fax:  [phone]  Physical Therapy Treatment  Patient Details  Name: Rollene Fareancy Nam MRN: [id number] Date of Birth: 11/18/1940 Referring Provider: Leonel RamsayFelder  Encounter Date: 03/12/2017      PT End of Session - 03/12/17 1525    Visit Number 5   Date for PT Re-Evaluation 04/15/17   PT Start Time 1435   PT Stop Time 1535   PT Time Calculation (min) 60 min   Activity Tolerance Patient tolerated treatment well   Behavior During Therapy [facility] for tasks assessed/performed;Anxious      Past Medical History:  Diagnosis Date  . Depression   . Hyperlipemia   . Osteoporosis     Past Surgical History:  Procedure Laterality Date  . ABDOMINAL HYSTERECTOMY    . BREAST SURGERY      There were no vitals filed for this visit.      Subjective Assessment - 03/12/17 1442    Subjective Patient reports that she is still having pain in the right arm and shoulder, she reports that the heat and estim helped   Currently in Pain? Yes   Pain Score 4    Pain Location Shoulder   Pain Orientation Right   Aggravating Factors  any activity increases pain   Pain Relieving Factors rest                         [facility] Adult PT Treatment/Exercise - 03/12/17 0001      Shoulder Exercises: Supine   Protraction AAROM;Both;20 reps     Shoulder Exercises: Prone   Flexion AAROM;20 reps     Shoulder Exercises: Standing   Other Standing Exercises Ball on table flex, CW/CCW x15   Other Standing Exercises red tband scapulat stabilization 2 ways, wand exercises all directions with a little assist.  Wall slides and circles     Shoulder Exercises: ROM/Strengthening   Rebounder L1 393frd/3rev     Shoulder Exercises: Isometric Strengthening   Extension 5X10"   External Rotation 5X10"   Internal Rotation 5X10"   ADduction 5X10"     Moist Heat Therapy   Number  Minutes Moist Heat 15 Minutes   Moist Heat Location Shoulder     Electrical Stimulation   Electrical Stimulation Location right upper trap and rhomboid area   Electrical Stimulation Action IFC   Electrical Stimulation Parameters supine   Electrical Stimulation Goals Pain     Manual Therapy   Manual Therapy Passive ROM   Manual therapy comments some PROM taken to end range and held   Passive ROM R shoulder                  PT Short Term Goals - 03/12/17 1527      PT SHORT TERM GOAL #1   Title independent with initial HEP   Status Achieved           PT Long Term Goals - 03/08/17 1515      PT LONG TERM GOAL #1   Title increase AROM of right shoulder flexion to 130 degrees   Status On-going     PT LONG TERM GOAL #2   Title increase ER of the right shoulder to 70 degrees   Status On-going     PT LONG TERM GOAL #3   Title decrease pain 50%   Status On-going     PT  LONG TERM GOAL #4   Title dress and do hair without difficulty   Status On-going     PT LONG TERM GOAL #5   Title lift 3# into head high cabinet   Status On-going               Plan - 03/12/17 1525    Clinical Impression Statement Patient with great improvements in ROM over the weekend.  She allowed more ROM but had less pain.   PT Next Visit Plan slowly add PROM/ AAROM,   Consulted and Agree with Plan of Care Patient      Patient will benefit from skilled therapeutic intervention in order to improve the following deficits and impairments:  Decreased strength, Increased edema, Decreased range of motion, Increased muscle spasms, Impaired flexibility, Postural dysfunction, Pain  Visit Diagnosis: Acute pain of right shoulder  Stiffness of right shoulder, not elsewhere classified  Cramp and spasm     Problem List There are no active problems to display for this patient.   Jearld Lesch., PT 03/12/2017, 3:28 PM  [facility] [place]  [facility] [address] [place], [place], [address] Phone: [phone]   Fax:  [phone]  Name: Terrilyn Tyner MRN: [id number] Date of Birth: [birth year]

## 2017-03-13 ENCOUNTER — Ambulatory Visit: Payer: Medicare Other | Admitting: Physical Therapy

## 2017-03-15 ENCOUNTER — Ambulatory Visit: Payer: Medicare Other | Admitting: Physical Therapy

## 2017-03-15 ENCOUNTER — Encounter: Payer: Self-pay | Admitting: Physical Therapy

## 2017-03-15 DIAGNOSIS — M25511 Pain in right shoulder: Secondary | ICD-10-CM

## 2017-03-15 DIAGNOSIS — M25611 Stiffness of right shoulder, not elsewhere classified: Secondary | ICD-10-CM

## 2017-03-15 DIAGNOSIS — R252 Cramp and spasm: Secondary | ICD-10-CM

## 2017-03-15 NOTE — Therapy (Signed)
[facility] [place] [address] [place], [place], [address] Phone: [phone]   Fax:  [phone]  Physical Therapy Treatment  Patient Details  Name: Kim Grant MRN: [id number] Date of Birth: April 17, 1941 Referring Provider: Leonel Ramsay  Encounter Date: 03/15/2017      PT End of Session - 03/15/17 0923    Visit Number 6   Date for PT Re-Evaluation 04/15/17   PT Start Time 0840   PT Stop Time 0937   PT Time Calculation (min) 57 min   Activity Tolerance Patient tolerated treatment well   Behavior During Therapy [facility] for tasks assessed/performed;Anxious      Past Medical History:  Diagnosis Date  . Depression   . Hyperlipemia   . Osteoporosis     Past Surgical History:  Procedure Laterality Date  . ABDOMINAL HYSTERECTOMY    . BREAST SURGERY      There were no vitals filed for this visit.      Subjective Assessment - 03/15/17 0845    Subjective Patient reports that she has increased right lateral arm pain.  Reports that she is unsure if she over did it on Monday or the day after.   Currently in Pain? Yes   Pain Score 5    Pain Location Shoulder   Pain Orientation Right   Pain Descriptors / Indicators Aching;Sore;Tightness            [facility] PT Assessment - 03/15/17 0001      PROM   Right Shoulder Flexion 130 Degrees   Right Shoulder ABduction 110 Degrees   Right Shoulder External Rotation 50 Degrees   Right Elbow Extension 10                     [facility] Adult PT Treatment/Exercise - 03/15/17 0001      Shoulder Exercises: Supine   Protraction AAROM;Both;20 reps     Shoulder Exercises: Standing   Internal Rotation Both;AAROM;15 reps   Theraband Level (Shoulder Internal Rotation) Level 2 (Red)   Extension AAROM;15 reps   Theraband Level (Shoulder Extension) Level 2 (Red)   Row 20 reps;Right   Theraband Level (Shoulder Row) Level 2 (Red)   Other Standing Exercises Ball on table flex, CW/CCW  x15   Other Standing Exercises red tband scapulat stabilization 2 ways, wand exercises all directions with a little assist.  Wall slides and circles     Shoulder Exercises: ROM/Strengthening   Rebounder L2 10frd/3rev   Wall Wash 10 flexion, 10 CW/CCW     Shoulder Exercises: Isometric Strengthening   Extension 5X10"   External Rotation 5X10"   Internal Rotation 5X10"   ADduction 5X10"     Moist Heat Therapy   Number Minutes Moist Heat 15 Minutes   Moist Heat Location Shoulder     Electrical Stimulation   Electrical Stimulation Location right upper trap and rhomboid area   Electrical Stimulation Action IFC   Electrical Stimulation Parameters sitting   Electrical Stimulation Goals Pain     Manual Therapy   Manual therapy comments STM to the right upper trap. the teres and the rhomboid                  PT Short Term Goals - 03/12/17 1527      PT SHORT TERM GOAL #1   Title independent with initial HEP   Status Achieved           PT Long Term Goals - 03/08/17 1515  PT LONG TERM GOAL #1   Title increase AROM of right shoulder flexion to 130 degrees   Status On-going     PT LONG TERM GOAL #2   Title increase ER of the right shoulder to 70 degrees   Status On-going     PT LONG TERM GOAL #3   Title decrease pain 50%   Status On-going     PT LONG TERM GOAL #4   Title dress and do hair without difficulty   Status On-going     PT LONG TERM GOAL #5   Title lift 3# into head high cabinet   Status On-going               Plan - 03/15/17 0925    Clinical Impression Statement Patient with increased c/o soreness today, seems to be mostly mm, she does have a lot of crepitus with motions   PT Next Visit Plan Continue with the AAROM and the    Consulted and Agree with Plan of Care Patient      Patient will benefit from skilled therapeutic intervention in order to improve the following deficits and impairments:  Decreased strength, Increased edema,  Decreased range of motion, Increased muscle spasms, Impaired flexibility, Postural dysfunction, Pain  Visit Diagnosis: Acute pain of right shoulder  Stiffness of right shoulder, not elsewhere classified  Cramp and spasm     Problem List There are no active problems to display for this patient.   Jearld LeschALBRIGHT,Dashia Caldeira W., PT 03/15/2017, 9:26 AM  [facility]- [address] [address] [place], [place], [address] Phone: [phone]   Fax:  [phone]  Name: Kim Grant MRN: [id number] Date of Birth: 1941/07/15

## 2017-03-20 ENCOUNTER — Encounter: Payer: Self-pay | Admitting: Physical Therapy

## 2017-03-20 ENCOUNTER — Ambulatory Visit: Payer: Medicare Other | Admitting: Physical Therapy

## 2017-03-20 DIAGNOSIS — R252 Cramp and spasm: Secondary | ICD-10-CM

## 2017-03-20 DIAGNOSIS — M25611 Stiffness of right shoulder, not elsewhere classified: Secondary | ICD-10-CM

## 2017-03-20 DIAGNOSIS — M25511 Pain in right shoulder: Secondary | ICD-10-CM

## 2017-03-20 NOTE — Therapy (Signed)
[facility] [place] [address] [place], [place], [address] Phone: [phone]   Fax:  [phone]  Physical Therapy Treatment  Patient Details  Name: Kim Grant MRN: [id number] Date of Birth: Feb 10, 1941 Referring Provider: Leonel RamsayFelder  Encounter Date: 03/20/2017      PT End of Session - 03/20/17 1144    Visit Number 7   Date for PT Re-Evaluation 04/15/17   PT Start Time 1055   PT Stop Time 1154   PT Time Calculation (min) 59 min   Activity Tolerance Patient tolerated treatment well   Behavior During Therapy [facility] for tasks assessed/performed;Anxious      Past Medical History:  Diagnosis Date  . Depression   . Hyperlipemia   . Osteoporosis     Past Surgical History:  Procedure Laterality Date  . ABDOMINAL HYSTERECTOMY    . BREAST SURGERY      There were no vitals filed for this visit.      Subjective Assessment - 03/20/17 1058    Subjective Patient reports that she was very sore on Sunday after doing a lot of cooking.  Reports that she rested all day yesterday.   Currently in Pain? Yes   Pain Score 2    Pain Location Shoulder   Pain Orientation Right   Aggravating Factors  movements and activity            [facility] PT Assessment - 03/20/17 0001      ROM / Strength   AROM / PROM / Strength AROM     AROM   AROM Assessment Site Shoulder   Right/Left Shoulder Right   Right Shoulder Flexion 70 Degrees   Right Shoulder ABduction 60 Degrees   Right Shoulder Internal Rotation 35 Degrees   Right Shoulder External Rotation 40 Degrees                     [facility] Adult PT Treatment/Exercise - 03/20/17 0001      Shoulder Exercises: Prone   Flexion AAROM;20 reps     Shoulder Exercises: Sidelying   External Rotation Right;20 reps;Weights   External Rotation Weight (lbs) 1   ABduction AAROM;Right;20 reps     Shoulder Exercises: Standing   External Rotation Strengthening;Right;20 reps;Theraband   Theraband Level (Shoulder External Rotation) Level 1 (Yellow)   Internal Rotation Both;AAROM;15 reps     Shoulder Exercises: ROM/Strengthening   Rebounder L2 863frd/3rev   Wall Wash 10 flexion, 10 CW/CCW   Wall Pushups 20 reps   Other ROM/Strengthening Exercises 5# row, 10# lat pull, 3# biceps, 5# tricep all 2x10     Shoulder Exercises: Isometric Strengthening   Extension 5X10"   External Rotation 5X10"     Moist Heat Therapy   Number Minutes Moist Heat 15 Minutes   Moist Heat Location Shoulder     Electrical Stimulation   Electrical Stimulation Location right upper trap and rhomboid area   Electrical Stimulation Action IFC   Electrical Stimulation Parameters sitting   Electrical Stimulation Goals Pain                  PT Short Term Goals - 03/12/17 1527      PT SHORT TERM GOAL #1   Title independent with initial HEP   Status Achieved           PT Long Term Goals - 03/20/17 1146      PT LONG TERM GOAL #1   Title increase AROM of right  shoulder flexion to 130 degrees   Status On-going     PT LONG TERM GOAL #2   Title increase ER of the right shoulder to 70 degrees   Status On-going     PT LONG TERM GOAL #3   Title decrease pain 50%   Status On-going     PT LONG TERM GOAL #4   Title dress and do hair without difficulty   Status On-going               Plan - 03/20/17 1145    Clinical Impression Statement Patient was measured for AROM today, she is very limited in standing, but does well with supine and sidelying if elbow is bent at 90 degrees to decrease the leverarm.   Clinical Presentation Evolving   Clinical Decision Making Moderate   PT Next Visit Plan will slowly start AROM and PRE's   Consulted and Agree with Plan of Care Patient      Patient will benefit from skilled therapeutic intervention in order to improve the following deficits and impairments:  Decreased strength, Increased edema, Decreased range of motion, Increased muscle  spasms, Impaired flexibility, Postural dysfunction, Pain  Visit Diagnosis: Acute pain of right shoulder  Stiffness of right shoulder, not elsewhere classified  Cramp and spasm     Problem List There are no active problems to display for this patient.   Jearld Lesch., PT 03/20/2017, 11:47 AM  [facility]- [address] [place] [address] [place], [place], [address] Phone: [phone]   Fax:  [phone]  Name: Kim Grant MRN: [id number] Date of Birth: 06-22-1941

## 2017-03-22 ENCOUNTER — Encounter: Payer: Self-pay | Admitting: Physical Therapy

## 2017-03-22 ENCOUNTER — Ambulatory Visit: Payer: Medicare Other | Admitting: Physical Therapy

## 2017-03-22 DIAGNOSIS — M25511 Pain in right shoulder: Secondary | ICD-10-CM | POA: Diagnosis not present

## 2017-03-22 DIAGNOSIS — R252 Cramp and spasm: Secondary | ICD-10-CM

## 2017-03-22 DIAGNOSIS — M25611 Stiffness of right shoulder, not elsewhere classified: Secondary | ICD-10-CM

## 2017-03-22 NOTE — Therapy (Signed)
[facility] [place] [address] [place], [place], [address] Phone: [phone]   Fax:  [phone]  Physical Therapy Treatment  Patient Details  Name: Kim Grant MRN: [id number] Date of Birth: 13-Dec-1940 Referring Provider: Leonel RamsayFelder  Encounter Date: 03/22/2017      PT End of Session - 03/22/17 1354    Visit Number 8   Date for PT Re-Evaluation 04/15/17   PT Start Time 1314   PT Stop Time 1414   PT Time Calculation (min) 60 min   Activity Tolerance Patient tolerated treatment well   Behavior During Therapy [facility] for tasks assessed/performed;Anxious      Past Medical History:  Diagnosis Date  . Depression   . Hyperlipemia   . Osteoporosis     Past Surgical History:  Procedure Laterality Date  . ABDOMINAL HYSTERECTOMY    . BREAST SURGERY      There were no vitals filed for this visit.      Subjective Assessment - 03/22/17 1315    Subjective Patient reports that she did some scraping and sweeping of her driveway yesterday and reports last night really was sore in the right upper arm   Currently in Pain? Yes   Pain Score 3    Pain Location Shoulder   Pain Orientation Right;Upper   Pain Descriptors / Indicators Aching;Sore                         [facility] Adult PT Treatment/Exercise - 03/22/17 0001      Shoulder Exercises: Standing   External Rotation Strengthening;Right;20 reps;Theraband   Theraband Level (Shoulder External Rotation) Level 1 (Yellow)   Internal Rotation Right;20 reps;Theraband   Theraband Level (Shoulder Internal Rotation) Level 1 (Yellow)   Extension AAROM;20 reps   Theraband Level (Shoulder Extension) Level 2 (Red)   Row 20 reps;Right   Theraband Level (Shoulder Row) Level 2 (Red)     Shoulder Exercises: ROM/Strengthening   Rebounder L4 723frd/3rev   Wall Wash 10 flexion, 10 CW/CCW, AAROM/AROM   Wall Pushups 20 reps   Other ROM/Strengthening Exercises 5# row, 10# lat pull,  3# biceps, 5# tricep all 2x10   Other ROM/Strengthening Exercises wand exercises all motions, cabinet reaching, due to pain and weakness had to do some AAROM and eccentrics, 3# biceps     Shoulder Exercises: Isometric Strengthening   Extension 5X10"   External Rotation 5X10"   Internal Rotation 5X10"   ADduction 5X10"     Moist Heat Therapy   Number Minutes Moist Heat 15 Minutes   Moist Heat Location Shoulder     Electrical Stimulation   Electrical Stimulation Location right upper trap and rhomboid area   Electrical Stimulation Action IFC   Electrical Stimulation Parameters sitting   Electrical Stimulation Goals Pain                  PT Short Term Goals - 03/12/17 1527      PT SHORT TERM GOAL #1   Title independent with initial HEP   Status Achieved           PT Long Term Goals - 03/20/17 1146      PT LONG TERM GOAL #1   Title increase AROM of right shoulder flexion to 130 degrees   Status On-going     PT LONG TERM GOAL #2   Title increase ER of the right shoulder to 70 degrees   Status On-going  PT LONG TERM GOAL #3   Title decrease pain 50%   Status On-going     PT LONG TERM GOAL #4   Title dress and do hair without difficulty   Status On-going               Plan - 03/22/17 1354    Clinical Impression Statement She appears to be getting stronger, still very weak and a lot of popping if she does not retract the shoulder.   PT Next Visit Plan will slowly start AROM and PRE's   Consulted and Agree with Plan of Care Patient      Patient will benefit from skilled therapeutic intervention in order to improve the following deficits and impairments:  Decreased strength, Increased edema, Decreased range of motion, Increased muscle spasms, Impaired flexibility, Postural dysfunction, Pain  Visit Diagnosis: Acute pain of right shoulder  Stiffness of right shoulder, not elsewhere classified  Cramp and spasm     Problem List There are no  active problems to display for this patient.   Jearld Lesch., PT 03/22/2017, 1:55 PM  [facility] [place] [address] [place], [place], [address] Phone: [phone]   Fax:  [phone]  Name: Kim Grant MRN: [id number] Date of Birth: 08-Jun-1941

## 2017-03-26 ENCOUNTER — Encounter: Payer: Self-pay | Admitting: Physical Therapy

## 2017-03-26 ENCOUNTER — Ambulatory Visit: Payer: Medicare Other | Attending: Sports Medicine | Admitting: Physical Therapy

## 2017-03-26 DIAGNOSIS — M25611 Stiffness of right shoulder, not elsewhere classified: Secondary | ICD-10-CM | POA: Insufficient documentation

## 2017-03-26 DIAGNOSIS — R2231 Localized swelling, mass and lump, right upper limb: Secondary | ICD-10-CM | POA: Diagnosis present

## 2017-03-26 DIAGNOSIS — M25511 Pain in right shoulder: Secondary | ICD-10-CM | POA: Insufficient documentation

## 2017-03-26 DIAGNOSIS — R252 Cramp and spasm: Secondary | ICD-10-CM

## 2017-03-26 NOTE — Therapy (Signed)
[facility] [place] [address] [place], [place], [address] Phone: [phone]   Fax:  [phone]  Physical Therapy Treatment  Patient Details  Name: Reesha Debes MRN: [id number] Date of Birth: Oct 26, 1940 Referring Provider: Leonel Ramsay  Encounter Date: 03/26/2017      PT End of Session - 03/26/17 1146    Visit Number 9   Date for PT Re-Evaluation 04/15/17   PT Start Time 1019   PT Stop Time 1109   PT Time Calculation (min) 50 min   Activity Tolerance Patient tolerated treatment well   Behavior During Therapy [facility] for tasks assessed/performed;Anxious      Past Medical History:  Diagnosis Date  . Depression   . Hyperlipemia   . Osteoporosis     Past Surgical History:  Procedure Laterality Date  . ABDOMINAL HYSTERECTOMY    . BREAST SURGERY      There were no vitals filed for this visit.      Subjective Assessment - 03/26/17 1131    Subjective Patient reports increased pain and soreness in the right upper arm and the upper trap   Currently in Pain? Yes   Pain Score 6    Pain Location Shoulder   Pain Orientation Right   Aggravating Factors  doing a lot more at home                         [facility] Adult PT Treatment/Exercise - 03/26/17 0001      Shoulder Exercises: Standing   Other Standing Exercises red tband scapulat stabilization 2 ways, wand exercises all directions with a little assist.  Wall slides and circles     Shoulder Exercises: ROM/Strengthening   Rebounder L4 79frd/3rev   Wall Pushups 20 reps   Other ROM/Strengthening Exercises wand exercises all motions, cabinet reaching, due to pain and weakness had to do some AAROM and eccentrics, 3# biceps     Shoulder Exercises: Isometric Strengthening   Flexion 5X10"   Extension 5X10"   External Rotation 5X10"   Internal Rotation 5X10"   ABduction 5X10"   ADduction 5X10"     Moist Heat Therapy   Number Minutes Moist Heat 15 Minutes   Moist  Heat Location Shoulder     Electrical Stimulation   Electrical Stimulation Location right upper arm   Electrical Stimulation Action IFC   Electrical Stimulation Parameters sitting   Electrical Stimulation Goals Pain     Manual Therapy   Manual Therapy Passive ROM   Manual therapy comments STM to the right upper trap. the teres and the rhomboid and into the deltoid   Passive ROM R shoulder                  PT Short Term Goals - 03/12/17 1527      PT SHORT TERM GOAL #1   Title independent with initial HEP   Status Achieved           PT Long Term Goals - 03/20/17 1146      PT LONG TERM GOAL #1   Title increase AROM of right shoulder flexion to 130 degrees   Status On-going     PT LONG TERM GOAL #2   Title increase ER of the right shoulder to 70 degrees   Status On-going     PT LONG TERM GOAL #3   Title decrease pain 50%   Status On-going     PT LONG TERM GOAL #  4   Title dress and do hair without difficulty   Status On-going               Plan - 03/26/17 1146    Clinical Impression Statement Having some increased soreness after doing more stuff at home, she needs a lot of cues to relax as she tends to elevate the shoulder and bring it forward.   PT Next Visit Plan continue to assess and alter as we can, she returns to the MD in about 3 weeks   Consulted and Agree with Plan of Care Patient      Patient will benefit from skilled therapeutic intervention in order to improve the following deficits and impairments:  Decreased strength, Increased edema, Decreased range of motion, Increased muscle spasms, Impaired flexibility, Postural dysfunction, Pain  Visit Diagnosis: Acute pain of right shoulder  Stiffness of right shoulder, not elsewhere classified  Cramp and spasm     Problem List There are no active problems to display for this patient.   Jearld LeschALBRIGHT,Aviyana Sonntag W., PT 03/26/2017, 11:48 AM  [facility]-  [address] [address] [place], [place], [address] Phone: [phone]   Fax:  [phone]  Name: Rollene Fareancy Toppin MRN: [id number] Date of Birth: 01-27-1941

## 2017-03-27 ENCOUNTER — Ambulatory Visit: Payer: Medicare Other | Admitting: Physical Therapy

## 2017-03-30 ENCOUNTER — Encounter: Payer: Self-pay | Admitting: Physical Therapy

## 2017-03-30 ENCOUNTER — Ambulatory Visit: Payer: Medicare Other | Admitting: Physical Therapy

## 2017-03-30 DIAGNOSIS — M25511 Pain in right shoulder: Secondary | ICD-10-CM

## 2017-03-30 DIAGNOSIS — M25611 Stiffness of right shoulder, not elsewhere classified: Secondary | ICD-10-CM

## 2017-03-30 DIAGNOSIS — R2231 Localized swelling, mass and lump, right upper limb: Secondary | ICD-10-CM

## 2017-03-30 DIAGNOSIS — R252 Cramp and spasm: Secondary | ICD-10-CM

## 2017-03-30 NOTE — Therapy (Signed)
[place], [place], [address] Phone: [phone]   Fax:  [phone]  Physical Therapy Treatment  Patient Details  Name: Kim Grant MRN: [id number] Date of Birth: 1941/02/26 Referring Provider: Davina Poke  Encounter Date: 03/30/2017      PT End of Session - 03/30/17 1202    Visit Number 10   Date for PT Re-Evaluation 04/15/17   PT Start Time 1019   PT Stop Time 1120   PT Time Calculation (min) 61 min   Activity Tolerance Patient tolerated treatment well   Behavior During Therapy [facility] for tasks assessed/performed;Anxious      Past Medical History:  Diagnosis Date  . Depression   . Hyperlipemia   . Osteoporosis     Past Surgical History:  Procedure Laterality Date  . ABDOMINAL HYSTERECTOMY    . BREAST SURGERY      There were no vitals filed for this visit.      Subjective Assessment - 03/30/17 1018    Subjective Patient reports frustration as she feels that she cannot reach up and out on her own.  She can do it with assist from the other arm   Currently in Pain? Yes   Pain Score 4    Pain Location Shoulder   Pain Orientation Right                         [facility] Adult PT Treatment/Exercise - 03/30/17 0001      Shoulder Exercises: Standing   External Rotation Strengthening;Right;20 reps;Theraband   Theraband Level (Shoulder External Rotation) Level 1 (Yellow)   Internal Rotation Right;20 reps;Theraband   Theraband Level (Shoulder Internal Rotation) Level 1 (Yellow)   Extension AAROM;20 reps   Theraband Level (Shoulder Extension) Level 2 (Red)   Row 20 reps;Right   Theraband Level (Shoulder Row) Level 2 (Red)   Other Standing Exercises walking with 1# in the right hand cues to relax and have natural arm swing     Shoulder Exercises: ROM/Strengthening   Rebounder L4 16fd/3rev   Wall Wash 10 flexion, 10 CW/CCW, AAROM/AROM   Other ROM/Strengthening Exercises 5# row,  10# lat pull, 3# biceps, 5# tricep all 2x10   Other ROM/Strengthening Exercises modified table slides and circles to get some motions, standing shoulder motions with mirror to show her the compensation that she does.     Shoulder Exercises: Isometric Strengthening   Flexion 5X10"   Extension 5X10"   External Rotation 5X10"   Internal Rotation 5X10"   ABduction 5X10"   ADduction 5X10"     Modalities   Modalities Vasopneumatic     Electrical Stimulation   Electrical Stimulation Location right upper arm   Electrical Stimulation Action IFC   Electrical Stimulation Parameters sitting   Electrical Stimulation Goals Pain     Vasopneumatic   Number Minutes Vasopneumatic  15 minutes   Vasopnuematic Location  Shoulder   Vasopneumatic Pressure Low   Vasopneumatic Temperature  33                  PT Short Term Goals - 03/12/17 1527      PT SHORT TERM GOAL #1   Title independent with initial HEP   Status Achieved           PT Long Term Goals - 03/30/17 1204      PT LONG TERM GOAL #1   Title increase AROM of right shoulder flexion to  130 degrees   Status On-going     PT LONG TERM GOAL #2   Title increase ER of the right shoulder to 70 degrees   Status On-going     PT LONG TERM GOAL #3   Title decrease pain 50%   Status Partially Met     PT LONG TERM GOAL #4   Title dress and do hair without difficulty   Status Partially Met     PT LONG TERM GOAL #5   Title lift 3# into head high cabinet   Status On-going               Plan - 04-02-17 1203    Clinical Impression Statement Patient is frustrated with her inability to reach out, she continues to come in holding her arm in a sling position, needs cues to relax.  The first few times she tries to reach out she really cannot do it, with cues and some assist she can do it and then actually has good motion but the first time is very difficult and could be because she gaurds so much   PT Next Visit Plan  continue to assess and alter as we can, she returns to the MD in about 3 weeks   Consulted and Agree with Plan of Care Patient      Patient will benefit from skilled therapeutic intervention in order to improve the following deficits and impairments:     Visit Diagnosis: Acute pain of right shoulder  Stiffness of right shoulder, not elsewhere classified  Cramp and spasm  Localized swelling, mass and lump, right upper limb       G-Codes - 02-Apr-2017 1205    Functional Assessment Tool Used (Outpatient Only) foto 62% limitation   Functional Limitation Carrying, moving and handling objects   Carrying, Moving and Handling Objects Current Status (Z6109) At least 60 percent but less than 80 percent impaired, limited or restricted   Carrying, Moving and Handling Objects Goal Status (U0454) At least 40 percent but less than 60 percent impaired, limited or restricted      Problem List There are no active problems to display for this patient.   Sumner Boast., PT April 02, 2017, 12:06 PM  [place] [address] [place], [place], [address] Phone: [phone]   Fax:  [phone]  Name: Kim Grant MRN: [id number] Date of Birth: 1941/06/22

## 2017-04-03 ENCOUNTER — Encounter: Payer: Self-pay | Admitting: Physical Therapy

## 2017-04-03 ENCOUNTER — Ambulatory Visit: Payer: Medicare Other | Admitting: Physical Therapy

## 2017-04-03 DIAGNOSIS — M25511 Pain in right shoulder: Secondary | ICD-10-CM | POA: Diagnosis not present

## 2017-04-03 DIAGNOSIS — R252 Cramp and spasm: Secondary | ICD-10-CM

## 2017-04-03 DIAGNOSIS — M25611 Stiffness of right shoulder, not elsewhere classified: Secondary | ICD-10-CM

## 2017-04-03 NOTE — Therapy (Signed)
[place] [address] [place], [place], [address] Phone: [phone]   Fax:  [phone]  Physical Therapy Treatment  Patient Details  Name: Kim Grant MRN: [id number] Date of Birth: 07-Apr-1941 Referring Provider: Davina Poke  Encounter Date: 04/03/2017      PT End of Session - 04/03/17 1518    Visit Number 11   Date for PT Re-Evaluation 04/15/17   PT Start Time 1400   PT Stop Time 1450   PT Time Calculation (min) 50 min   Activity Tolerance Patient tolerated treatment well   Behavior During Therapy [facility] for tasks assessed/performed;Anxious      Past Medical History:  Diagnosis Date  . Depression   . Hyperlipemia   . Osteoporosis     Past Surgical History:  Procedure Laterality Date  . ABDOMINAL HYSTERECTOMY    . BREAST SURGERY      There were no vitals filed for this visit.      Subjective Assessment - 04/03/17 1404    Subjective Patient continues to have frustration with pain and stiffness.  She reports that she is doing her hair easier.  She has some increased soreness today and tightness in the rhomboids and upper traps   Currently in Pain? Yes   Pain Score 4    Pain Location Shoulder   Pain Orientation Right   Pain Descriptors / Indicators Sore;Spasm;Tightness;Aching   Aggravating Factors  activity                         [facility] Adult PT Treatment/Exercise - 04/03/17 0001      Shoulder Exercises: Standing   External Rotation Strengthening;Right;20 reps;Theraband   Theraband Level (Shoulder External Rotation) Level 1 (Yellow)   Internal Rotation Right;20 reps;Theraband   Theraband Level (Shoulder Internal Rotation) Level 1 (Yellow)   Extension AAROM;20 reps   Theraband Level (Shoulder Extension) Level 2 (Red)   Other Standing Exercises cabinet reaching, some eccentrics     Shoulder Exercises: ROM/Strengthening   Rebounder 4 minutes Level 4 all forward   Wall Wash 10 flexion,  10 CW/CCW, AAROM/AROM   Wall Pushups 20 reps   Other ROM/Strengthening Exercises modified table slides and circles to get some motions, standing shoulder motions with mirror to show her the compensation that she does.     Moist Heat Therapy   Number Minutes Moist Heat 15 Minutes   Moist Heat Location Shoulder     Electrical Stimulation   Electrical Stimulation Location right upper arm   Electrical Stimulation Action IFC   Electrical Stimulation Parameters sitting   Electrical Stimulation Goals Pain     Manual Therapy   Manual Therapy Passive ROM   Manual therapy comments STM to the right upper trap. the teres and the rhomboid and into the deltoid   Passive ROM R shoulder                  PT Short Term Goals - 03/12/17 1527      PT SHORT TERM GOAL #1   Title independent with initial HEP   Status Achieved           PT Long Term Goals - 03/30/17 1204      PT LONG TERM GOAL #1   Title increase AROM of right shoulder flexion to 130 degrees   Status On-going     PT LONG TERM GOAL #2   Title increase ER of the right shoulder to  70 degrees   Status On-going     PT LONG TERM GOAL #3   Title decrease pain 50%   Status Partially Met     PT LONG TERM GOAL #4   Title dress and do hair without difficulty   Status Partially Met     PT LONG TERM GOAL #5   Title lift 3# into head high cabinet   Status On-going               Plan - 04/03/17 1519    Clinical Impression Statement Patient is doing better with her ability to reach out but still very difficult the first few times.  She is very weak above 80 degrees flexion/abduction.  She has a lot of spasms, demonstrating some less gaurding with the "sling" position   PT Next Visit Plan she thinks she sees MD next week, we will work on [employer] and Agree with Plan of Care Patient      Patient will benefit from skilled therapeutic intervention in order to improve the following deficits and  impairments:  Decreased strength, Increased edema, Decreased range of motion, Increased muscle spasms, Impaired flexibility, Postural dysfunction, Pain  Visit Diagnosis: Acute pain of right shoulder  Stiffness of right shoulder, not elsewhere classified  Cramp and spasm     Problem List There are no active problems to display for this patient.   Sumner Boast., PT 04/03/2017, 3:21 PM  [place], [place], [address] Phone: [phone]   Fax:  [phone]  Name: Kim Grant MRN: [id number] Date of Birth: Sep 06, 1941

## 2017-04-05 ENCOUNTER — Ambulatory Visit: Payer: Medicare Other | Admitting: Physical Therapy

## 2017-04-10 ENCOUNTER — Encounter: Payer: Self-pay | Admitting: Physical Therapy

## 2017-04-10 ENCOUNTER — Ambulatory Visit: Payer: Medicare Other | Admitting: Physical Therapy

## 2017-04-10 DIAGNOSIS — M25511 Pain in right shoulder: Secondary | ICD-10-CM

## 2017-04-10 DIAGNOSIS — R252 Cramp and spasm: Secondary | ICD-10-CM

## 2017-04-10 DIAGNOSIS — M25611 Stiffness of right shoulder, not elsewhere classified: Secondary | ICD-10-CM

## 2017-04-10 DIAGNOSIS — R2231 Localized swelling, mass and lump, right upper limb: Secondary | ICD-10-CM

## 2017-04-10 NOTE — Therapy (Signed)
[place], [place], [address] Phone: [phone]   Fax:  [phone]  Physical Therapy Treatment  Patient Details  Name: Kim Grant MRN: [id number] Date of Birth: Oct 08, 1941 Referring Provider: Davina Poke  Encounter Date: 04/10/2017      PT End of Session - 04/10/17 1003    Visit Number 12   Date for PT Re-Evaluation 05/15/17   PT Start Time 0925   PT Stop Time 1025   PT Time Calculation (min) 60 min   Activity Tolerance Patient tolerated treatment well   Behavior During Therapy [facility] for tasks assessed/performed;Anxious      Past Medical History:  Diagnosis Date  . Depression   . Hyperlipemia   . Osteoporosis     Past Surgical History:  Procedure Laterality Date  . ABDOMINAL HYSTERECTOMY    . BREAST SURGERY      There were no vitals filed for this visit.      Subjective Assessment - 04/10/17 0929    Subjective Patient reports that over the weekend that she had much less pain and soreness, she has been frustrated witht he pain and the popping over the past few weeks.   Currently in Pain? Yes   Pain Score 2    Pain Location Shoulder   Pain Orientation Right;Anterior   Pain Descriptors / Indicators Sore   Aggravating Factors  activity   Pain Relieving Factors rest            [facility] PT Assessment - 04/10/17 0001      AROM   AROM Assessment Site Shoulder   Right/Left Shoulder Right   Right Shoulder Flexion 108 Degrees   Right Shoulder ABduction 92 Degrees   Right Shoulder Internal Rotation 50 Degrees   Right Shoulder External Rotation 60 Degrees     PROM   Right Shoulder Flexion 150 Degrees   Right Shoulder ABduction 120 Degrees   Right Shoulder Internal Rotation 60 Degrees   Right Shoulder External Rotation 80 Degrees   Right Elbow Extension 5                     [facility] Adult PT Treatment/Exercise - 04/10/17 0001      Shoulder Exercises: Standing   External Rotation Strengthening;Right;20 reps;Theraband   Theraband Level (Shoulder External Rotation) Level 1 (Yellow)   Internal Rotation Right;20 reps;Theraband   Theraband Level (Shoulder Internal Rotation) Level 1 (Yellow)   Extension Strengthening;20 reps   Theraband Level (Shoulder Extension) Level 2 (Red)   Row 20 reps;Right   Theraband Level (Shoulder Row) Level 2 (Red)   Other Standing Exercises walking with 1# in the right hand cues to relax and have natural arm swing   Other Standing Exercises cabinet reaching, some eccentrics     Shoulder Exercises: ROM/Strengthening   Rebounder 4 minutes Level 4 all forward   Wall Wash 10 flexion, 10 CW/CCW, AAROM/AROM   Wall Pushups 20 reps   Other ROM/Strengthening Exercises 5# row, 10# lat pull, 3# biceps, 5# tricep all 2x10   Other ROM/Strengthening Exercises modified table slides and circles to get some motions, standing shoulder motions with mirror to show her the compensation that she does.     Shoulder Exercises: Isometric Strengthening   Flexion 5X10"   Extension 5X10"   External Rotation 5X10"   Internal Rotation 5X10"   ABduction 5X10"   ADduction 5X10"     Moist Heat Therapy   Number Minutes Moist Heat  15 Minutes   Moist Heat Location Shoulder     Electrical Stimulation   Electrical Stimulation Location right upper arm   Electrical Stimulation Action IFC   Electrical Stimulation Parameters sitting   Electrical Stimulation Goals Pain                  PT Short Term Goals - 03/12/17 1527      PT SHORT TERM GOAL #1   Title independent with initial HEP   Status Achieved           PT Long Term Goals - 04/10/17 1005      PT LONG TERM GOAL #1   Title increase AROM of right shoulder flexion to 130 degrees   Status Partially Met     PT LONG TERM GOAL #2   Title increase ER of the right shoulder to 70 degrees   Status Partially Met     PT LONG TERM GOAL #3   Title decrease pain 50%   Status  Partially Met     PT LONG TERM GOAL #4   Title dress and do hair without difficulty   Status Partially Met     PT LONG TERM GOAL #5   Title lift 3# into head high cabinet   Status Partially Met               Plan - 04/10/17 1004    Clinical Impression Statement Patient has made great gains in ROM and function, she is still very limited in her functional strength in flexion and abduction, she had a lot of popping and pain over the past month but we have worked on stability and correct motions, today there was very minimal popping   PT Frequency 2x / week   PT Duration 4 weeks   PT Treatment/Interventions ADLs/Self Care Home Management;Electrical Stimulation;Cryotherapy;Moist Heat;Therapeutic activities;Therapeutic exercise;Patient/family education;Passive range of motion;Manual techniques   PT Next Visit Plan please advise if you want [place] to focus on anything different   Consulted and Agree with Plan of Care Patient      Patient will benefit from skilled therapeutic intervention in order to improve the following deficits and impairments:  Decreased strength, Increased edema, Decreased range of motion, Increased muscle spasms, Impaired flexibility, Postural dysfunction, Pain  Visit Diagnosis: Acute pain of right shoulder - Plan: PT plan of care cert/re-cert  Stiffness of right shoulder, not elsewhere classified - Plan: PT plan of care cert/re-cert  Cramp and spasm - Plan: PT plan of care cert/re-cert  Localized swelling, mass and lump, right upper limb - Plan: PT plan of care cert/re-cert     Problem List There are no active problems to display for this patient.   Sumner Boast., PT 04/10/2017, 10:07 AM  [place], [place], [address] Phone: [phone]   Fax:  [phone]  Name: Resa Rinks MRN: [id number] Date of Birth: Dec 02, 1940

## 2017-04-17 ENCOUNTER — Ambulatory Visit: Payer: Medicare Other | Admitting: Physical Therapy

## 2017-04-17 ENCOUNTER — Encounter: Payer: Self-pay | Admitting: Physical Therapy

## 2017-04-17 DIAGNOSIS — M25611 Stiffness of right shoulder, not elsewhere classified: Secondary | ICD-10-CM

## 2017-04-17 DIAGNOSIS — R252 Cramp and spasm: Secondary | ICD-10-CM

## 2017-04-17 DIAGNOSIS — M25511 Pain in right shoulder: Secondary | ICD-10-CM | POA: Diagnosis not present

## 2017-04-17 NOTE — Therapy (Signed)
[place], [place], [address] Phone: [phone]   Fax:  [phone]  Physical Therapy Treatment  Patient Details  Name: Kim Grant MRN: [id number] Date of Birth: 1940-11-04 Referring Provider: Davina Poke  Encounter Date: 04/17/2017      PT End of Session - 04/17/17 1154    Visit Number 13   Date for PT Re-Evaluation 05/15/17   PT Start Time 1057   PT Stop Time 1205   PT Time Calculation (min) 68 min   Activity Tolerance Patient tolerated treatment well   Behavior During Therapy [facility] for tasks assessed/performed      Past Medical History:  Diagnosis Date  . Depression   . Hyperlipemia   . Osteoporosis     Past Surgical History:  Procedure Laterality Date  . ABDOMINAL HYSTERECTOMY    . BREAST SURGERY      There were no vitals filed for this visit.      Subjective Assessment - 04/17/17 1058    Subjective Patient saw the MD, reports very pleased with her progress.  She is to try to return to work this weekend.  Reports that she washed her hair this AM and is having some pain in the deltoid and the underarm area.   Currently in Pain? Yes   Pain Score 2    Pain Location Shoulder   Pain Orientation Right   Aggravating Factors  washing hair                         [facility] Adult PT Treatment/Exercise - 04/17/17 0001      Shoulder Exercises: ROM/Strengthening   Rebounder 6 minutes Level 4 all forward   Wall Wash 10 flexion, 10 CW/CCW, AAROM/AROM, scaption caused some popping, overhead ab/adduction   Other ROM/Strengthening Exercises 20# triceps 5# biceps, 15# row   Other ROM/Strengthening Exercises wand exercises all motions, cabinet reaching with 1 # flexion and some abduction     Shoulder Exercises: Stretch   Corner Stretch 3 reps;10 seconds   Cross Chest Stretch 4 reps;10 seconds     Moist Heat Therapy   Number Minutes Moist Heat 15 Minutes   Moist Heat Location  Shoulder     Electrical Stimulation   Electrical Stimulation Location right upper arm   Electrical Stimulation Action IFC   Electrical Stimulation Parameters sitting   Electrical Stimulation Goals Pain     Manual Therapy   Manual Therapy Passive ROM   Passive ROM R shoulder all motions of the [place] joint to end range woith some modification of angle to strech the pecs, the elbow and wrist, also blocked the scapula                  PT Short Term Goals - 03/12/17 1527      PT SHORT TERM GOAL #1   Title independent with initial HEP   Status Achieved           PT Long Term Goals - 04/17/17 1158      PT LONG TERM GOAL #1   Title increase AROM of right shoulder flexion to 130 degrees   Status Partially Met     PT LONG TERM GOAL #2   Title increase ER of the right shoulder to 70 degrees   Status Partially Met               Plan - 04/17/17 1155    Clinical  Impression Statement Patient saw the MD, he was pleased with her function and the healing of the bone, she is going to return to work this week.  She does not see the MD for another 3 months.  She has tightness and some pain with ER, and horizontal adduction, pain is mostly over the biceps origin.   PT Next Visit Plan Will see patient 1x/week over the next week or two, see how she responds to returning to work   [employer] and Agree with Plan of Care Patient      Patient will benefit from skilled therapeutic intervention in order to improve the following deficits and impairments:  Decreased strength, Increased edema, Decreased range of motion, Increased muscle spasms, Impaired flexibility, Postural dysfunction, Pain  Visit Diagnosis: Acute pain of right shoulder  Stiffness of right shoulder, not elsewhere classified  Cramp and spasm     Problem List There are no active problems to display for this patient.   Sumner Boast., PT 04/17/2017, 11:59 AM  [place], [place], [address] Phone: [phone]   Fax:  [phone]  Name: Kim Grant MRN: [id number] Date of Birth: 22-Apr-1941

## 2017-04-19 ENCOUNTER — Ambulatory Visit: Payer: Medicare Other | Admitting: Physical Therapy

## 2017-04-26 ENCOUNTER — Ambulatory Visit: Payer: Medicare Other | Attending: Sports Medicine | Admitting: Physical Therapy

## 2017-04-26 ENCOUNTER — Encounter: Payer: Self-pay | Admitting: Physical Therapy

## 2017-04-26 DIAGNOSIS — M25611 Stiffness of right shoulder, not elsewhere classified: Secondary | ICD-10-CM | POA: Diagnosis present

## 2017-04-26 DIAGNOSIS — M25511 Pain in right shoulder: Secondary | ICD-10-CM | POA: Diagnosis not present

## 2017-04-26 DIAGNOSIS — R252 Cramp and spasm: Secondary | ICD-10-CM | POA: Diagnosis present

## 2017-04-26 NOTE — Therapy (Signed)
[place], [place], [address] Phone: [phone]   Fax:  [phone]  Physical Therapy Treatment  Patient Details  Name: Kim Grant MRN: [id number] Date of Birth: June 15, 1941 Referring Provider: Davina Poke  Encounter Date: 04/26/2017      PT End of Session - 04/26/17 1641    Visit Number 14   Date for PT Re-Evaluation 05/15/17   PT Start Time 1520   PT Stop Time 1615   PT Time Calculation (min) 55 min   Activity Tolerance Patient tolerated treatment well   Behavior During Therapy [facility] for tasks assessed/performed      Past Medical History:  Diagnosis Date  . Depression   . Hyperlipemia   . Osteoporosis     Past Surgical History:  Procedure Laterality Date  . ABDOMINAL HYSTERECTOMY    . BREAST SURGERY      There were no vitals filed for this visit.      Subjective Assessment - 04/26/17 1522    Subjective Patient returned to work last week, started out at 4 hours the first day and then the next two days she worked 8 hours a day.  She reports that she has been really sore and tired.  She reports right upper arm pain and sorenes is a 4/10.   Currently in Pain? Yes   Pain Score 4    Pain Location Shoulder   Pain Orientation Right   Pain Descriptors / Indicators Sore   Pain Type Acute pain   Aggravating Factors  I feel weak and sore with ADL's   Pain Relieving Factors rest                         [facility] Adult PT Treatment/Exercise - 04/26/17 0001      Shoulder Exercises: Standing   Other Standing Exercises cabinet reaching, some eccentrics     Shoulder Exercises: ROM/Strengthening   Rebounder 6 minutes Level 4 all forward   Wall Wash 10 flexion, 10 CW/CCW, AAROM/AROM, scaption caused some popping, overhead ab/adduction, then worked on Copywriter, advertising and coming off the wall   Wall Pushups 20 reps   Other ROM/Strengthening Exercises wand exercises all motions,  cabinet reaching with 1 # flexion and some abduction     Shoulder Exercises: IT sales professional 3 reps;10 seconds   Cross Chest Stretch 4 reps;10 seconds     Moist Heat Therapy   Number Minutes Moist Heat 15 Minutes   Moist Heat Location Shoulder     Electrical Stimulation   Electrical Stimulation Location right upper arm   Electrical Stimulation Action IFC   Electrical Stimulation Parameters sitting   Electrical Stimulation Goals Pain     Manual Therapy   Manual Therapy Passive ROM   Manual therapy comments STM to the right upper trap. the teres and the rhomboid and into the deltoid   Passive ROM R shoulder all motions of the [place] joint to end range woith some modification of angle to strech the pecs, the elbow and wrist, also blocked the scapula                PT Education - 04/26/17 1641    Education provided Yes   Education Details gave HEP to do posterior shoulder stretch, doorway stretch, star gazer stretch and eccentric flexion on the wall          PT Short Term Goals - 03/12/17 1527  PT SHORT TERM GOAL #1   Title independent with initial HEP   Status Achieved           PT Long Term Goals - 04/26/17 1648      PT LONG TERM GOAL #1   Title increase AROM of right shoulder flexion to 130 degrees   Status Partially Met     PT LONG TERM GOAL #3   Title decrease pain 50%   Status Partially Met     PT LONG TERM GOAL #4   Title dress and do hair without difficulty   Status Partially Met     PT LONG TERM GOAL #5   Title lift 3# into head high cabinet   Status Partially Met               Plan - 04/26/17 1643    Clinical Impression Statement Patient had some increased soreness in the right shoulder with her returning to work, she has tenderness and spasms in the upper trap and in the teres area.  She reports that she has not done her stretches.     PT Next Visit Plan She is going to try the new exercises, she does not return to work  until after next week   Consulted and Agree with Plan of Care Patient      Patient will benefit from skilled therapeutic intervention in order to improve the following deficits and impairments:  Decreased strength, Increased edema, Decreased range of motion, Increased muscle spasms, Impaired flexibility, Postural dysfunction, Pain  Visit Diagnosis: Acute pain of right shoulder  Stiffness of right shoulder, not elsewhere classified  Cramp and spasm     Problem List There are no active problems to display for this patient.   Sumner Boast., PT 04/26/2017, 4:49 PM  [place], [place], [address] Phone: [phone]   Fax:  [phone]  Name: Dimples Probus MRN: [id number] Date of Birth: June 11, 1941

## 2017-05-03 ENCOUNTER — Ambulatory Visit: Payer: Medicare Other | Admitting: Physical Therapy

## 2017-05-08 ENCOUNTER — Ambulatory Visit: Payer: Medicare Other | Admitting: Physical Therapy

## 2017-05-11 ENCOUNTER — Ambulatory Visit: Payer: Medicare Other | Admitting: Physical Therapy

## 2017-05-11 ENCOUNTER — Encounter: Payer: Self-pay | Admitting: Physical Therapy

## 2017-05-11 DIAGNOSIS — R252 Cramp and spasm: Secondary | ICD-10-CM

## 2017-05-11 DIAGNOSIS — M25511 Pain in right shoulder: Secondary | ICD-10-CM | POA: Diagnosis not present

## 2017-05-11 DIAGNOSIS — M25611 Stiffness of right shoulder, not elsewhere classified: Secondary | ICD-10-CM

## 2017-05-11 NOTE — Therapy (Signed)
[place], [place], [address] Phone: [phone]   Fax:  [phone]  Physical Therapy Treatment  Patient Details  Name: Kim Grant MRN: [id number] Date of Birth: [birth year] Referring Provider: Davina Poke  Encounter Date: 05/11/2017      PT End of Session - 05/11/17 0837    Visit Number 15   Date for PT Re-Evaluation 05/15/17   PT Start Time 0758   PT Stop Time 0847   PT Time Calculation (min) 49 min   Activity Tolerance Patient tolerated treatment well   Behavior During Therapy [facility] for tasks assessed/performed      Past Medical History:  Diagnosis Date  . Depression   . Hyperlipemia   . Osteoporosis     Past Surgical History:  Procedure Laterality Date  . ABDOMINAL HYSTERECTOMY    . BREAST SURGERY      There were no vitals filed for this visit.      Subjective Assessment - 05/11/17 0801    Subjective Reports that work is going better.  Reports some spasms in the right upper back.  Reports that she is not able to reach up as high as she would like   Currently in Pain? Yes   Pain Score 4    Pain Location Shoulder   Pain Orientation Right;Posterior   Pain Descriptors / Indicators Tightness;Sore            [facility] PT Assessment - 05/11/17 0001      AROM   Right Shoulder Flexion 108 Degrees   Right Shoulder ABduction 102 Degrees   Right Shoulder Internal Rotation 55 Degrees   Right Shoulder External Rotation 70 Degrees                     [facility] Adult PT Treatment/Exercise - 05/11/17 0001      Shoulder Exercises: Standing   Other Standing Exercises overhead 1# carry 100' x 2   Other Standing Exercises cabinet reaching, some eccentrics     Shoulder Exercises: ROM/Strengthening   Rebounder 6 minutes Level 5 all forward   Wall Wash 10 flexion, 10 CW/CCW, AAROM/AROM, scaption caused some popping, overhead ab/adduction, then worked on Copywriter, advertising and coming  off the wall     Shoulder Exercises: IT sales professional 3 reps;10 seconds   Cross Chest Stretch 4 reps;10 seconds     Moist Heat Therapy   Number Minutes Moist Heat 15 Minutes   Moist Heat Location Shoulder     Electrical Stimulation   Electrical Stimulation Location right upper arm   Electrical Stimulation Action IFC   Electrical Stimulation Parameters sitting   Electrical Stimulation Goals Pain     Manual Therapy   Manual Therapy Passive ROM   Manual therapy comments STM to the right upper trap. the teres and the rhomboid and into the deltoid   Passive ROM R shoulder all motions of the [place] joint to end range woith some modification of angle to strech the pecs, the elbow and wrist, also blocked the scapula                  PT Short Term Goals - 03/12/17 1527      PT SHORT TERM GOAL #1   Title independent with initial HEP   Status Achieved           PT Long Term Goals - 05/11/17 0839      PT LONG TERM GOAL #  1   Title increase AROM of right shoulder flexion to 130 degrees   Status Partially Met     PT LONG TERM GOAL #2   Title increase ER of the right shoulder to 70 degrees   Status Partially Met     PT LONG TERM GOAL #3   Title decrease pain 50%   Status Partially Met     PT LONG TERM GOAL #4   Title dress and do hair without difficulty   Status Achieved     PT LONG TERM GOAL #5   Title lift 3# into head high cabinet   Status Partially Met               Plan - 05/11/17 0837    Clinical Impression Statement Doing well over all, had increased AROM for all except flexion stayed about where it was.  She has spasms in the upper traps the rhomboids and the teres.  She overall is doing well with her function but still limited with flexion and functional strength out front and over sholder height   PT Next Visit Plan we will start to see 1x/week or everyother week as she is concerned about wanting to continue to improve   Consulted and Agree  with Plan of Care Patient      Patient will benefit from skilled therapeutic intervention in order to improve the following deficits and impairments:  Decreased strength, Increased edema, Decreased range of motion, Increased muscle spasms, Impaired flexibility, Postural dysfunction, Pain  Visit Diagnosis: Acute pain of right shoulder  Stiffness of right shoulder, not elsewhere classified  Cramp and spasm     Problem List There are no active problems to display for this patient.   Sumner Boast., PT 05/11/2017, 8:40 AM  [place], [place], [address] Phone: [phone]   Fax:  [phone]  Name: Kim Grant MRN: [id number] Date of Birth: [birth year]

## 2017-05-25 DIAGNOSIS — F339 Major depressive disorder, recurrent, unspecified: Secondary | ICD-10-CM | POA: Insufficient documentation

## 2017-05-29 ENCOUNTER — Ambulatory Visit: Payer: Medicare Other | Admitting: Physical Therapy

## 2017-05-30 ENCOUNTER — Encounter: Payer: Self-pay | Admitting: Physical Therapy

## 2017-05-30 ENCOUNTER — Ambulatory Visit: Payer: Medicare Other | Attending: Sports Medicine | Admitting: Physical Therapy

## 2017-05-30 DIAGNOSIS — R252 Cramp and spasm: Secondary | ICD-10-CM | POA: Insufficient documentation

## 2017-05-30 DIAGNOSIS — M25611 Stiffness of right shoulder, not elsewhere classified: Secondary | ICD-10-CM | POA: Insufficient documentation

## 2017-05-30 DIAGNOSIS — M25511 Pain in right shoulder: Secondary | ICD-10-CM

## 2017-05-30 NOTE — Therapy (Signed)
[place], [place], [address] Phone: [phone]   Fax:  [phone]  Physical Therapy Treatment  Patient Details  Name: Kim Grant MRN: [id number] Date of Birth: Sep 15, 1941 Referring Provider: Davina Poke  Encounter Date: 05/30/2017      PT End of Session - 05/30/17 1419    Visit Number 16   Date for PT Re-Evaluation 06/30/17   PT Start Time 1013   PT Stop Time 1102   PT Time Calculation (min) 49 min   Activity Tolerance Patient tolerated treatment well   Behavior During Therapy [facility] for tasks assessed/performed      Past Medical History:  Diagnosis Date  . Depression   . Hyperlipemia   . Osteoporosis     Past Surgical History:  Procedure Laterality Date  . ABDOMINAL HYSTERECTOMY    . BREAST SURGERY      There were no vitals filed for this visit.      Subjective Assessment - 05/30/17 1013    Subjective Patient reports a fall last Thursday, she landed on her left side and reports pain in the left ribs and underarm area, she reports that the right shoulder is doing well, just not as good a range of motions as she would like.  She reports the MD wanted her to work on strength more   Currently in Pain? Yes   Pain Score 1    Pain Location Shoulder   Pain Orientation Right;Posterior   Aggravating Factors  activity   Pain Relieving Factors rest            [facility] PT Assessment - 05/30/17 0001      AROM   Right Shoulder Flexion (P)  118 Degrees                     [facility] Adult PT Treatment/Exercise - 05/30/17 0001      Shoulder Exercises: ROM/Strengthening   Rebounder 6 minutes Level 5 all forward   Other ROM/Strengthening Exercises 20# triceps 5# biceps, 15# row, 20# lat pulls, 20# leg press, leg extension 10#, leg curls 25#   Other ROM/Strengthening Exercises Resisted gait, tandem walking                PT Education - 05/30/17 1416    Education provided  Yes   Education Details printed out a flow sheet and wrote down all of the exercises we did today and asked her to try to go to gym and fine the machines   Person(s) Educated Patient   Methods Explanation;Demonstration;Handout   Comprehension Verbalized understanding;Verbal cues required;Need further instruction          PT Short Term Goals - 03/12/17 1527      PT SHORT TERM GOAL #1   Title independent with initial HEP   Status Achieved           PT Long Term Goals - 05/30/17 1422      PT LONG TERM GOAL #1   Title increase AROM of right shoulder flexion to 130 degrees   Status Partially Met     PT LONG TERM GOAL #2   Title increase ER of the right shoulder to 70 degrees   Status Partially Met     PT LONG TERM GOAL #3   Title decrease pain 50%   Status Achieved     PT LONG TERM GOAL #4   Title dress and do hair without difficulty  Status Achieved               Plan - 05/30/17 1420    Clinical Impression Statement Patient reported a fall over the weekend and hit her left arm, she is concerned about her balance and general health, reporting about two weeks ago being unable to get up from sitting in a low chair.  We worked on an overall fitness routine for her and included some balance.  She did well but really needs further instruction and help, her shoulder ROM is improving and the MD reported we could become more agressive with our strengthening   PT Next Visit Plan she is to try the gym exercises, may hold 2-3 weeks   Consulted and Agree with Plan of Care Patient      Patient will benefit from skilled therapeutic intervention in order to improve the following deficits and impairments:  Decreased strength, Increased edema, Decreased range of motion, Increased muscle spasms, Impaired flexibility, Postural dysfunction, Pain  Visit Diagnosis: Acute pain of right shoulder - Plan: PT plan of care cert/re-cert  Stiffness of right shoulder, not elsewhere  classified - Plan: PT plan of care cert/re-cert     Problem List There are no active problems to display for this patient.   Sumner Boast., PT 05/30/2017, 2:27 PM  [place], [place], [address] Phone: [phone]   Fax:  [phone]  Name: Kim Grant MRN: [id number] Date of Birth: 17-Sep-1941

## 2017-06-18 ENCOUNTER — Ambulatory Visit: Payer: Medicare Other | Admitting: Physical Therapy

## 2017-06-21 ENCOUNTER — Ambulatory Visit: Payer: Medicare Other | Admitting: Physical Therapy

## 2017-06-21 ENCOUNTER — Encounter: Payer: Self-pay | Admitting: Physical Therapy

## 2017-06-21 DIAGNOSIS — M25611 Stiffness of right shoulder, not elsewhere classified: Secondary | ICD-10-CM

## 2017-06-21 DIAGNOSIS — R252 Cramp and spasm: Secondary | ICD-10-CM

## 2017-06-21 DIAGNOSIS — M25511 Pain in right shoulder: Secondary | ICD-10-CM | POA: Diagnosis not present

## 2017-06-21 NOTE — Therapy (Signed)
[place], [place], [address] Phone: [phone]   Fax:  [phone]  Physical Therapy Treatment  Patient Details  Name: Kim Grant MRN: [id number] Date of Birth: 06-06-1941 Referring Provider: Davina Poke  Encounter Date: 06/21/2017      PT End of Session - 06/21/17 0911    Visit Number 17   Date for PT Re-Evaluation 06/30/17   PT Start Time 0839   PT Stop Time 0940   PT Time Calculation (min) 61 min   Activity Tolerance Patient tolerated treatment well   Behavior During Therapy [facility] for tasks assessed/performed      Past Medical History:  Diagnosis Date  . Depression   . Hyperlipemia   . Osteoporosis     Past Surgical History:  Procedure Laterality Date  . ABDOMINAL HYSTERECTOMY    . BREAST SURGERY      There were no vitals filed for this visit.      Subjective Assessment - 06/21/17 0840    Subjective Patient reports that she is having some increased right shoulder with activity and reaching across, she appears to have some decreaesd ROM   Currently in Pain? Yes   Pain Score 5    Pain Location Shoulder   Pain Orientation Right   Pain Descriptors / Indicators Sore   Aggravating Factors  reaching   Pain Relieving Factors rest            [facility] PT Assessment - 06/21/17 0001      AROM   Right Shoulder Flexion 100 Degrees   Right Shoulder ABduction 81 Degrees   Right Shoulder Internal Rotation 45 Degrees   Right Shoulder External Rotation 55 Degrees                     [facility] Adult PT Treatment/Exercise - 06/21/17 0001      Modalities   Modalities Iontophoresis     Moist Heat Therapy   Number Minutes Moist Heat 15 Minutes   Moist Heat Location Shoulder     Electrical Stimulation   Electrical Stimulation Location right upper arm   Electrical Stimulation Action IFC   Electrical Stimulation Parameters sitting   Electrical Stimulation Goals Pain     Iontophoresis   Type of Iontophoresis Dexamethasone   Location right shoulder anterolateral   Dose 64m   Time 4hour patch     Manual Therapy   Manual Therapy Passive ROM   Manual therapy comments STM to the right upper trap. the teres and the rhomboid and into the deltoid   Passive ROM R shoulder all motions of the [place] to end range woith some modification of angle to strech the pecs, the elbow and wrist, also blocked the scapula                  PT Short Term Goals - 03/12/17 1527      PT SHORT TERM GOAL #1   Title independent with initial HEP   Status Achieved           PT Long Term Goals - 06/21/17 0913      PT LONG TERM GOAL #1   Title increase AROM of right shoulder flexion to 130 degrees   Status Partially Met     PT LONG TERM GOAL #2   Title increase ER of the right shoulder to 70 degrees   Status Partially Met  Plan - 06/21/17 0912    Clinical Impression Statement Patient has lost ROM over the past 3 weeks, she is stiff and and very sore, it seems like she has either irritated it or she has not been doing her stretches at home.  After STM and PROM her ROM was back to 120 degrees flexion actively   PT Next Visit Plan I really encouraged her to do her exercises and stretches as she has regressed without PT   Consulted and Agree with Plan of Care Patient      Patient will benefit from skilled therapeutic intervention in order to improve the following deficits and impairments:  Decreased strength, Increased edema, Decreased range of motion, Increased muscle spasms, Impaired flexibility, Postural dysfunction, Pain  Visit Diagnosis: Acute pain of right shoulder  Stiffness of right shoulder, not elsewhere classified  Cramp and spasm     Problem List There are no active problems to display for this patient.   Sumner Boast., PT 06/21/2017, 9:14 AM  [place], [place], [address] Phone: [phone]   Fax:  [phone]  Name: Kim Grant MRN: [id number] Date of Birth: [birth year]

## 2017-07-05 ENCOUNTER — Encounter: Payer: Self-pay | Admitting: Physical Therapy

## 2017-07-05 ENCOUNTER — Ambulatory Visit: Payer: Medicare Other | Attending: Sports Medicine | Admitting: Physical Therapy

## 2017-07-05 DIAGNOSIS — R252 Cramp and spasm: Secondary | ICD-10-CM | POA: Diagnosis present

## 2017-07-05 DIAGNOSIS — M25511 Pain in right shoulder: Secondary | ICD-10-CM

## 2017-07-05 DIAGNOSIS — M25611 Stiffness of right shoulder, not elsewhere classified: Secondary | ICD-10-CM | POA: Insufficient documentation

## 2017-07-05 NOTE — Therapy (Signed)
[place], [place], [address] Phone: [phone]   Fax:  [phone]  Physical Therapy Treatment  Patient Details  Name: Kim Grant MRN: [id number] Date of Birth: 12/23/1940 Referring Provider: Davina Poke  Encounter Date: 07/05/2017      PT End of Session - 07/05/17 1654    Visit Number 18   Date for PT Re-Evaluation 07/30/17   PT Start Time 1601   PT Stop Time 1648   PT Time Calculation (min) 47 min   Activity Tolerance Patient tolerated treatment well   Behavior During Therapy [facility] for tasks assessed/performed      Past Medical History:  Diagnosis Date  . Depression   . Hyperlipemia   . Osteoporosis     Past Surgical History:  Procedure Laterality Date  . ABDOMINAL HYSTERECTOMY    . BREAST SURGERY      There were no vitals filed for this visit.      Subjective Assessment - 07/05/17 1642    Subjective Patient reports that she has overdone it recently, "Pushed too hard on stretches", c/o increased right posterior shoulder pain   Currently in Pain? Yes   Pain Score 6    Pain Location Shoulder   Pain Orientation Right;Posterior   Pain Descriptors / Indicators Sore                         [facility] Adult PT Treatment/Exercise - 07/05/17 0001      Modalities   Modalities Ultrasound     Moist Heat Therapy   Number Minutes Moist Heat 15 Minutes   Moist Heat Location Shoulder     Electrical Stimulation   Electrical Stimulation Location right upper arm   Electrical Stimulation Action IFC   Electrical Stimulation Parameters sitting   Electrical Stimulation Goals Pain     Ultrasound   Ultrasound Location right posterior shoulder   Ultrasound Parameters 100% 1.5w/cm2   Ultrasound Goals Pain     Manual Therapy   Manual Therapy Soft tissue mobilization;Passive ROM   Manual therapy comments STM to the posterior shoulder complex and the deltoid and biceps   Passive  ROM R shoulder all motions of the [place] joint to end range woith some modification of angle to strech the pecs, the elbow and wrist, also blocked the scapula                  PT Short Term Goals - 03/12/17 1527      PT SHORT TERM GOAL #1   Title independent with initial HEP   Status Achieved           PT Long Term Goals - 07/05/17 1656      PT LONG TERM GOAL #1   Title increase AROM of right shoulder flexion to 130 degrees   Status Partially Met     PT LONG TERM GOAL #2   Title increase ER of the right shoulder to 70 degrees   Status Partially Met     PT LONG TERM GOAL #3   Title decrease pain 50%   Status Partially Met               Plan - 07/05/17 1654    Clinical Impression Statement Patient very tender in the posterior shoulder area, teres, infraspinatus, she was also tender in the deltoid and biceps area.  She feels like she has overdone it with stretches.   PT  Next Visit Plan asked her to not push the stretches but assure that she does not lose her ROM   Consulted and Agree with Plan of Care Patient      Patient will benefit from skilled therapeutic intervention in order to improve the following deficits and impairments:  Decreased strength, Increased edema, Decreased range of motion, Increased muscle spasms, Impaired flexibility, Postural dysfunction, Pain  Visit Diagnosis: Acute pain of right shoulder  Stiffness of right shoulder, not elsewhere classified  Cramp and spasm     Problem List There are no active problems to display for this patient.   Sumner Boast., PT 07/05/2017, 4:57 PM  [place], [place], [address] Phone: [phone]   Fax:  [phone]  Name: Kim Grant MRN: [id number] Date of Birth: 1941-07-13

## 2018-02-04 DIAGNOSIS — R5383 Other fatigue: Secondary | ICD-10-CM | POA: Insufficient documentation

## 2018-02-04 HISTORY — DX: Other fatigue: R53.83

## 2018-03-27 ENCOUNTER — Ambulatory Visit: Payer: Medicare Other | Admitting: Primary Care

## 2018-06-09 ENCOUNTER — Emergency Department (HOSPITAL_BASED_OUTPATIENT_CLINIC_OR_DEPARTMENT_OTHER)
Admission: EM | Admit: 2018-06-09 | Discharge: 2018-06-09 | Disposition: A | Discharge status: Home / Self Care | Payer: Medicare Other | Attending: Emergency Medicine | Admitting: Emergency Medicine

## 2018-06-09 ENCOUNTER — Other Ambulatory Visit: Payer: Self-pay

## 2018-06-09 ENCOUNTER — Encounter (HOSPITAL_BASED_OUTPATIENT_CLINIC_OR_DEPARTMENT_OTHER): Payer: Self-pay | Admitting: Emergency Medicine

## 2018-06-09 DIAGNOSIS — R0981 Nasal congestion: Secondary | ICD-10-CM | POA: Diagnosis not present

## 2018-06-09 DIAGNOSIS — R42 Dizziness and giddiness: Secondary | ICD-10-CM | POA: Diagnosis present

## 2018-06-09 DIAGNOSIS — Z79899 Other long term (current) drug therapy: Secondary | ICD-10-CM | POA: Diagnosis not present

## 2018-06-09 DIAGNOSIS — H81399 Other peripheral vertigo, unspecified ear: Secondary | ICD-10-CM | POA: Insufficient documentation

## 2018-06-09 DIAGNOSIS — Z87891 Personal history of nicotine dependence: Secondary | ICD-10-CM | POA: Diagnosis not present

## 2018-06-09 LAB — CBC WITH DIFFERENTIAL/PLATELET
BASOS PCT: 1 %
Basophils Absolute: 0 10*3/uL (ref 0.0–0.1)
EOS ABS: 0.3 10*3/uL (ref 0.0–0.7)
Eosinophils Relative: 5 %
HEMATOCRIT: 34.5 % — AB (ref 36.0–46.0)
HEMOGLOBIN: 11.5 g/dL — AB (ref 12.0–15.0)
Lymphocytes Relative: 16 %
Lymphs Abs: 1.1 10*3/uL (ref 0.7–4.0)
MCH: 28.6 pg (ref 26.0–34.0)
MCHC: 33.3 g/dL (ref 30.0–36.0)
MCV: 85.8 fL (ref 78.0–100.0)
MONOS PCT: 7 %
Monocytes Absolute: 0.5 10*3/uL (ref 0.1–1.0)
NEUTROS ABS: 4.6 10*3/uL (ref 1.7–7.7)
NEUTROS PCT: 71 %
Platelets: 291 10*3/uL (ref 150–400)
RBC: 4.02 MIL/uL (ref 3.87–5.11)
RDW: 13.9 % (ref 11.5–15.5)
WBC: 6.5 10*3/uL (ref 4.0–10.5)

## 2018-06-09 LAB — COMPREHENSIVE METABOLIC PANEL
ALBUMIN: 4.1 g/dL (ref 3.5–5.0)
ALT: 10 U/L (ref 0–44)
AST: 26 U/L (ref 15–41)
Anion gap: 8 (ref 5–15)
BUN: 29 mg/dL — AB (ref 8–23)
CALCIUM: 10.2 mg/dL (ref 8.9–10.3)
CO2: 28 mmol/L (ref 22–32)
CREATININE: 1.03 mg/dL — AB (ref 0.44–1.00)
Chloride: 100 mmol/L (ref 98–111)
GFR calc Af Amer: 60 mL/min — ABNORMAL LOW (ref >60)
GFR calc non Af Amer: 51 mL/min — ABNORMAL LOW (ref >60)
GLUCOSE: 87 mg/dL (ref 70–99)
Potassium: 4.1 mmol/L (ref 3.5–5.1)
SODIUM: 136 mmol/L (ref 135–145)
TOTAL PROTEIN: 7.1 g/dL (ref 6.5–8.1)

## 2018-06-09 LAB — URINALYSIS, ROUTINE W REFLEX MICROSCOPIC
BILIRUBIN URINE: NEGATIVE
Glucose, UA: NEGATIVE mg/dL
HGB URINE DIPSTICK: NEGATIVE
Ketones, ur: NEGATIVE mg/dL
Leukocytes, UA: NEGATIVE
NITRITE: NEGATIVE
Protein, ur: NEGATIVE mg/dL
Specific Gravity, Urine: <1.005 — ABNORMAL LOW (ref 1.005–1.030)
pH: 8 (ref 5.0–8.0)

## 2018-06-09 MED ORDER — DIAZEPAM 2 MG PO TABS: 2.0000 mg | ORAL_TABLET | Freq: Four times a day (QID) | ORAL | 0 refills | Status: DC | PRN

## 2018-06-09 MED ORDER — GI COCKTAIL ~~LOC~~
30.0000 mL | Freq: Once | ORAL | Status: AC
  Administered 2018-06-09: 30 mL via ORAL
  Filled 2018-06-09: qty 30

## 2018-06-09 MED ORDER — DIAZEPAM 2 MG PO TABS
2.0000 mg | ORAL_TABLET | Freq: Once | ORAL | Status: AC
  Administered 2018-06-09: 2 mg via ORAL
  Filled 2018-06-09: qty 1

## 2018-06-09 NOTE — ED Provider Notes (Signed)
[facility] EMERGENCY DEPARTMENT Provider Note   CSN: [id number] Arrival date & time: 06/09/18  1107     History   Chief Complaint Chief Complaint  Patient presents with  . Dizziness    HPI Kim Grant is a 77 y.o. female.  HPI Patient has history of recurrent vertigo.  Was seen in the emergency department several years ago for the same.  Had MRI at that time which was normal.  States that her dizziness started 2 days ago.  Describes it as feeling "pushed around" associated with nausea.  States she been taking meclizine at home with some improvement of her symptoms.  Has had congestion-like symptoms for the past couple weeks.  Has some intermittent "roaring" to the left ear.  Denies any fever or chills.  No visual changes.  No focal weakness or numbness. Past Medical History:  Diagnosis Date  . Depression   . Hyperlipemia   . Osteoporosis     There are no active problems to display for this patient.   Past Surgical History:  Procedure Laterality Date  . ABDOMINAL HYSTERECTOMY    . BREAST SURGERY       OB History   None      Home Medications    Prior to Admission medications   Medication Sig Start Date End Date Taking? Authorizing Provider  atorvastatin (LIPITOR) 40 MG tablet Take 40 mg by mouth every evening.  08/06/13   [provider]  calcium citrate-vitamin D (CITRACAL+D) 315-200 MG-UNIT per tablet Take 3 tablets by mouth daily.    [provider]  citalopram (CELEXA) 20 MG tablet Take 30 mg by mouth daily.  08/06/13   [provider]  diazepam (VALIUM) 2 MG tablet Take 1 tablet (2 mg total) by mouth every 6 (six) hours as needed for anxiety (dizziness). 06/09/18   Loren Racer, MD  fish oil-omega-3 fatty acids 1000 MG capsule Take 2 g by mouth daily.    [provider]  ibuprofen (ADVIL,MOTRIN) 200 MG tablet Take 200 mg by mouth every 6 (six) hours as needed for mild pain or moderate pain.    [provider]  meclizine (ANTIVERT) 25 MG tablet Take 1 tablet (25 mg total) by mouth 3 (three) times daily as needed for dizziness. 04/04/14   Geoffery Lyons, MD  Multiple Vitamin (MULTIVITAMIN WITH MINERALS) TABS tablet Take 1 tablet by mouth daily.    [provider]  phentermine 37.5 MG capsule Take 37.5 mg by mouth every morning.    [provider]    Family History No family history on file.  Social History Social History   Tobacco Use  . Smoking status: Former Smoker    Last attempt to quit: 02/28/2013    Years since quitting: 5.2  . Smokeless tobacco: Never Used  Substance Use Topics  . Alcohol use: No  . Drug use: No     Allergies   Patient has no known allergies.   Review of Systems Review of Systems  Constitutional: Negative for chills and fever.  HENT: Positive for congestion and tinnitus. Negative for ear pain, sinus pressure, sinus pain, sore throat and trouble swallowing.   Eyes: Negative for visual disturbance.  Respiratory: Negative for cough and shortness of breath.   Cardiovascular: Negative for chest pain.  Gastrointestinal: Positive for nausea. Negative for abdominal pain, constipation, diarrhea and vomiting.  Genitourinary: Negative for dysuria, flank pain and hematuria.  Musculoskeletal: Positive for gait problem. Negative for back pain, myalgias, neck  pain and neck stiffness.  Skin: Negative for rash and wound.  Neurological: Positive for dizziness. Negative for light-headedness, numbness and headaches.  All other systems reviewed and are negative.    Physical Exam Updated Vital Signs BP 138/83 (BP Location: Right Arm)   Pulse 75   Temp 98.2 F (36.8 C) (Oral)   Resp 16   Ht 5\' 5"  (1.651 m)   Wt 72.6 kg   SpO2 99%   BMI 26.63 kg/m   Physical Exam  Constitutional: She is oriented to person, place, and time. She appears well-developed and well-nourished. No distress.  HENT:  Head: Normocephalic and atraumatic.    Mouth/Throat: Oropharynx is clear and moist.  Fluid level behind the left TM.  Eyes: Pupils are equal, round, and reactive to light. EOM are normal.  Fatigable horizontal nystagmus.  Neck: Normal range of motion. Neck supple. No JVD present.  Cardiovascular: Normal rate and regular rhythm. Exam reveals no gallop and no friction rub.  Murmur heard. Pulmonary/Chest: Effort normal and breath sounds normal. No stridor. No respiratory distress. She has no wheezes. She has no rales. She exhibits no tenderness.  Abdominal: Soft. Bowel sounds are normal. There is no tenderness. There is no rebound and no guarding.  Musculoskeletal: Normal range of motion. She exhibits no edema or tenderness.  No lower extremity swelling, asymmetry or tenderness.  Distal pulses are 2+.  Lymphadenopathy:    She has no cervical adenopathy.  Neurological: She is alert and oriented to person, place, and time.  Patient is alert and oriented x3 with clear, goal oriented speech. Patient has 5/5 motor in all extremities. Sensation is intact to light touch. Bilateral finger-to-nose is normal with no signs of dysmetria.   Skin: Skin is warm and dry. No rash noted. She is not diaphoretic. No erythema.  Psychiatric: She has a normal mood and affect. Her behavior is normal.  Nursing note and vitals reviewed.    ED Treatments / Results  Labs (all labs ordered are listed, but only abnormal results are displayed) Labs Reviewed  URINALYSIS, ROUTINE W REFLEX MICROSCOPIC - Abnormal; Notable for the following components:      Result Value   Specific Gravity, Urine <1.005 (*)    All other components within normal limits  CBC WITH DIFFERENTIAL/PLATELET - Abnormal; Notable for the following components:   Hemoglobin 11.5 (*)    HCT 34.5 (*)    All other components within normal limits  COMPREHENSIVE METABOLIC PANEL - Abnormal; Notable for the following components:   BUN 29 (*)    Creatinine, Ser 1.03 (*)    Alkaline Phosphatase  <5 (*)    Total Bilirubin <0.1 (*)    GFR calc non Af Amer 51 (*)    GFR calc Af Amer 60 (*)    All other components within normal limits  CBC WITH DIFFERENTIAL/PLATELET    EKG EKG Interpretation  Date/Time:  Sunday June 09 2018 11:15:53 EDT Ventricular Rate:  70 PR Interval:    QRS Duration: 95 QT Interval:  396 QTC Calculation: 428 R Axis:   -28 Text Interpretation:  Sinus rhythm Borderline left axis deviation Confirmed by Loren RacerYelverton, Breezy Hertenstein ([id number]) on 06/09/2018 12:03:02 PM   Radiology No results found.  Procedures Procedures (including critical care time)  Medications Ordered in ED Medications  diazepam (VALIUM) tablet 2 mg (2 mg Oral Given 06/09/18 1210)  gi cocktail (Maalox,Lidocaine,Donnatal) (30 mLs Oral Given 06/09/18 1210)     Initial Impression / Assessment and Plan / ED Course  I have reviewed the triage vital signs and the nursing notes.  Pertinent labs & imaging results that were available during my care of the patient were reviewed by me and considered in my medical decision making (see chart for details).     She states she is feeling much better after medication.  Is able to ambulate.  Normal neurologic exam.  Has history of episodic vertigo.  Had MRI on her last emergency department visit.  No evidence of stroke.  Low suspicion for that here.  Do not believe that emergent imaging is necessary at this point.  Advised follow-up with ear, nose and throat specialist.  Return precautions given.  Final Clinical Impressions(s) / ED Diagnoses   Final diagnoses:  Peripheral vertigo, unspecified laterality    ED Discharge Orders         Ordered    diazepam (VALIUM) 2 MG tablet  Every 6 hours PRN     06/09/18 1355           Loren RacerYelverton, Kahli Mayon, MD 06/09/18 1357

## 2018-06-09 NOTE — ED Notes (Signed)
ED Provider at bedside. Dr. Yelverton 

## 2018-06-09 NOTE — ED Triage Notes (Signed)
Dizziness since Friday with nausea. Denies chest pain, SOB

## 2018-06-09 NOTE — ED Notes (Addendum)
Pt d/c home with son to drive. Rx x 1 given for valium

## 2018-07-26 DIAGNOSIS — M75102 Unspecified rotator cuff tear or rupture of left shoulder, not specified as traumatic: Secondary | ICD-10-CM | POA: Insufficient documentation

## 2018-07-26 HISTORY — DX: Unspecified rotator cuff tear or rupture of left shoulder, not specified as traumatic: M75.102

## 2018-08-19 ENCOUNTER — Encounter: Payer: Self-pay | Admitting: Physical Therapy

## 2018-08-19 ENCOUNTER — Ambulatory Visit: Payer: Medicare Other | Attending: Sports Medicine | Admitting: Physical Therapy

## 2018-08-19 DIAGNOSIS — M25612 Stiffness of left shoulder, not elsewhere classified: Secondary | ICD-10-CM | POA: Diagnosis present

## 2018-08-19 DIAGNOSIS — R252 Cramp and spasm: Secondary | ICD-10-CM | POA: Insufficient documentation

## 2018-08-19 DIAGNOSIS — M25512 Pain in left shoulder: Secondary | ICD-10-CM | POA: Diagnosis not present

## 2018-08-19 DIAGNOSIS — R600 Localized edema: Secondary | ICD-10-CM | POA: Diagnosis present

## 2018-08-19 NOTE — Therapy (Signed)
[facility] [place] [facility] [address] [place], [place], [address] Phone: [phone]   Fax:  [phone]  Physical Therapy Evaluation  Patient Details  Name: Kim Grant MRN: [id number] Date of Birth: 08-01-1941 Referring Provider (PT): Leonel Ramsay   Encounter Date: 08/19/2018  PT End of Session - 08/19/18 1451    Visit Number  1    Date for PT Re-Evaluation  10/19/18    PT Start Time  1315    PT Stop Time  1410    PT Time Calculation (min)  55 min    Activity Tolerance  No increased pain;Treatment limited secondary to medical complications (Comment)    Behavior During Therapy  [facility] for tasks assessed/performed       Past Medical History:  Diagnosis Date  . Depression   . Hyperlipemia   . Osteoporosis     Past Surgical History:  Procedure Laterality Date  . ABDOMINAL HYSTERECTOMY    . BREAST SURGERY      There were no vitals filed for this visit.   Subjective Assessment - 08/19/18 1323    Subjective  Patient underwent a left RC repair on 08/14/18.  She reports that she had 3 tendons were torn.  She has a protocol for [facility] repair as well as biceps protection, reports that she hurt the arm lying down about 4 months ago    Limitations  Lifting;House hold activities    Patient Stated Goals  have better motions with less pain, do ADL's    Currently in Pain?  Yes    Pain Score  2     Pain Location  Shoulder    Pain Orientation  Left    Pain Descriptors / Indicators  Aching;Sore    Pain Type  Acute pain;Surgical pain    Pain Onset  In the past 7 days    Pain Frequency  Constant    Aggravating Factors   any motions, when pain meds wear off, pain an 8/10    Pain Relieving Factors  rest, pain meds pain can be 2/10    Effect of Pain on Daily Activities  limits everything         [facility] PT Assessment - 08/19/18 0001      Assessment   Medical Diagnosis  s/p left RC repair and biceps repair    Referring Provider (PT)  Leonel Ramsay    Onset Date/Surgical Date  08/14/18    Hand Dominance  Right    Prior Therapy  for right shoulder fracture last year      Precautions   Precautions  Shoulder    Type of Shoulder Precautions  follow RC repair and biceps repair protocol      Balance Screen   Has the patient fallen in the past 6 months  No    Has the patient had a decrease in activity level because of a fear of falling?   No    Is the patient reluctant to leave their home because of a fear of falling?   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    Leisure  chair yoga      Posture/Postural Control   Posture Comments  very gaurded  ROM / Strength   AROM / PROM / Strength  PROM      PROM   PROM Assessment Site  Shoulder    Right/Left Shoulder  Left    Left Shoulder Flexion  55 Degrees    Left Shoulder ABduction  50 Degrees    Left Shoulder Internal Rotation  30 Degrees   arm at side   Left Shoulder External Rotation  0 Degrees   arm at side     Palpation   Palpation comment  a lot of ecchymosis left biceps, and the chest, she is very tender in the left rhomboid and the upper trap due to guarding                Objective measurements completed on examination: See above findings.      [facility] Adult PT Treatment/Exercise - 08/19/18 0001      Modalities   Modalities  Vasopneumatic      Vasopneumatic   Number Minutes Vasopneumatic   15 minutes    Vasopnuematic Location   Shoulder    Vasopneumatic Pressure  Medium    Vasopneumatic Temperature   36             PT Education - 08/19/18 1450    Education Details  went over at length with patient and the use of the sling the protocol and HEP, she had brought in many different types of slings and pieces that she had mixed up    Person(s) Educated   Patient    Methods  Explanation;Demonstration;Handout;Verbal cues;Tactile cues    Comprehension  Returned demonstration;Verbalized understanding;Verbal cues required;Tactile cues required       PT Short Term Goals - 08/19/18 1453      PT SHORT TERM GOAL #1   Title  independent with initial HEP    Time  2    Period  Weeks    Status  New        PT Long Term Goals - 08/19/18 1453      PT LONG TERM GOAL #1   Title  increase AROM of left shoulder flexion to 130 degrees    Time  12    Period  Weeks    Status  New      PT LONG TERM GOAL #2   Title  increase ER of the leftshoulder to 70 degrees    Time  12    Period  Weeks    Status  New      PT LONG TERM GOAL #3   Title  decrease pain 50%    Time  12    Period  Weeks    Status  New      PT LONG TERM GOAL #4   Title  dress and do hair without difficulty    Time  12    Period  Weeks    Status  New      PT LONG TERM GOAL #5   Title  lift 3# into head high cabinet    Time  16    Period  Weeks    Status  New             Plan - 08/19/18 1451    Clinical Impression Statement  Patient with a left RC repair with biceps repair on 08/14/18.  She has significant bruising, gaurding and pain, she needs a lot of help with the sling, she also needed a lot of instruction in what she can and cannot do.  Her ROM is very limited but she did allow PROM without a lot of resistance    Clinical Presentation  Evolving    Clinical Decision Making  Low    Rehab Potential  Good    PT Frequency  3x / week    PT Duration  8 weeks    PT Treatment/Interventions  ADLs/Self Care Home Management;Cryotherapy;Electrical Stimulation;Moist Heat;Therapeutic exercise;Therapeutic activities;Patient/family education;Manual techniques;Dry needling    PT Next Visit Plan  follow protocol, PROM only for the first 6 weeks    Consulted and Agree with Plan of Care  Patient       Patient will benefit from skilled therapeutic intervention in order to  improve the following deficits and impairments:  Decreased range of motion, Impaired UE functional use, Increased muscle spasms, Decreased activity tolerance, Pain, Improper body mechanics, Decreased strength, Increased edema, Postural dysfunction  Visit Diagnosis: Acute pain of left shoulder - Plan: PT plan of care cert/re-cert  Stiffness of left shoulder, not elsewhere classified - Plan: PT plan of care cert/re-cert  Cramp and spasm - Plan: PT plan of care cert/re-cert  Localized edema - Plan: PT plan of care cert/re-cert     Problem List There are no active problems to display for this patient.   Jearld Lesch., PT 08/19/2018, 2:55 PM  [facility] [place] [facility] [address] [place], [place], [address] Phone: [phone]   Fax:  [phone]  Name: Kim Grant MRN: [id number] Date of Birth: 08-17-1941

## 2018-09-04 ENCOUNTER — Ambulatory Visit: Payer: Medicare Other | Admitting: Physical Therapy

## 2018-09-09 ENCOUNTER — Encounter: Payer: Medicare Other | Admitting: Physical Therapy

## 2018-09-16 ENCOUNTER — Encounter: Payer: Medicare Other | Admitting: Physical Therapy

## 2018-10-01 ENCOUNTER — Encounter: Payer: Self-pay | Admitting: Physical Therapy

## 2018-10-01 ENCOUNTER — Ambulatory Visit: Payer: Medicare Other | Attending: Sports Medicine | Admitting: Physical Therapy

## 2018-10-01 DIAGNOSIS — R252 Cramp and spasm: Secondary | ICD-10-CM

## 2018-10-01 DIAGNOSIS — M25612 Stiffness of left shoulder, not elsewhere classified: Secondary | ICD-10-CM | POA: Diagnosis present

## 2018-10-01 DIAGNOSIS — R600 Localized edema: Secondary | ICD-10-CM

## 2018-10-01 DIAGNOSIS — M25512 Pain in left shoulder: Secondary | ICD-10-CM

## 2018-10-01 NOTE — Therapy (Signed)
[facility] [place] [facility] [address] [place], [place], [address] Phone: [phone]   Fax:  [phone]  Physical Therapy Treatment  Patient Details  Name: Serinity Ware MRN: [id number] Date of Birth: [birth year] Referring Provider (PT): Leonel Ramsay   Encounter Date: 10/01/2018  PT End of Session - 10/01/18 1147    Visit Number  2    Date for PT Re-Evaluation  10/19/18    PT Start Time  1040    PT Stop Time  1130    PT Time Calculation (min)  50 min    Activity Tolerance  No increased pain;Treatment limited secondary to medical complications (Comment)    Behavior During Therapy  Anxious       Past Medical History:  Diagnosis Date  . Depression   . Hyperlipemia   . Osteoporosis     Past Surgical History:  Procedure Laterality Date  . ABDOMINAL HYSTERECTOMY    . BREAST SURGERY      There were no vitals filed for this visit.  Subjective Assessment - 10/01/18 1054    Subjective  Patient underwent a left RC repair on 08/14/18.  She reports that she had 3 tendons were torn.  She has a protocol for [facility] repair as well as biceps protection, reports that she hurt the arm lying down about 4 months ago.  We saw her for the evaluation, the MD decided to have her not have PT for the first 6 weeks due to the amount of tendon repair that she had done.  She reports that she has been very busy cooking and cleaning.  She reports that she does have pain when she does "too much".    Currently in Pain?  Yes    Pain Score  1     Pain Location  Shoulder    Pain Orientation  Left    Pain Descriptors / Indicators  Sore    Aggravating Factors   reports a lot of difficulty dressing and doing hair    Pain Relieving Factors  rest and pain meds pain can be 0/10         [facility] PT Assessment - 10/01/18 0001      ROM / Strength   AROM / PROM / Strength  AROM;PROM      AROM   Overall AROM Comments  all motions are slow and painful    AROM Assessment  Site  Shoulder    Right/Left Shoulder  Left    Left Shoulder Flexion  50 Degrees    Left Shoulder ABduction  40 Degrees    Left Shoulder Internal Rotation  0 Degrees    Left Shoulder External Rotation  10 Degrees      PROM   Overall PROM Comments  all motions very painful    Left Shoulder Flexion  80 Degrees    Left Shoulder ABduction  65 Degrees    Left Shoulder Internal Rotation  0 Degrees    Left Shoulder External Rotation  20 Degrees                   [facility] Adult PT Treatment/Exercise - 10/01/18 0001      Exercises   Exercises  Shoulder      Shoulder Exercises: Supine   Flexion  AAROM;20 reps    Flexion Limitations  PT with cues to limit how far down she comes as this was causing some biceps pain      Shoulder Exercises: Seated  Other Seated Exercises  AAROM/PROM for flexion and ER      Shoulder Exercises: Standing   Other Standing Exercises  ball rolling      Shoulder Exercises: ROM/Strengthening   UBE (Upper Arm Bike)  level 1 x 4 minutes    Other ROM/Strengthening Exercises  shoulder shrugs, scapular retraction             PT Education - 10/01/18 1146    Education Details  AAROM with hand slides on table fot flexion and then her bending forward for ER of the shoulder    Person(s) Educated  Patient    Methods  Explanation;Demonstration;Handout    Comprehension  Verbalized understanding       PT Short Term Goals - 10/01/18 1149      PT SHORT TERM GOAL #1   Title  independent with initial HEP    Status  On-going        PT Long Term Goals - 08/19/18 1453      PT LONG TERM GOAL #1   Title  increase AROM of left shoulder flexion to 130 degrees    Time  12    Period  Weeks    Status  New      PT LONG TERM GOAL #2   Title  increase ER of the leftshoulder to 70 degrees    Time  12    Period  Weeks    Status  New      PT LONG TERM GOAL #3   Title  decrease pain 50%    Time  12    Period  Weeks    Status  New      PT LONG TERM  GOAL #4   Title  dress and do hair without difficulty    Time  12    Period  Weeks    Status  New      PT LONG TERM GOAL #5   Title  lift 3# into head high cabinet    Time  16    Period  Weeks    Status  New            Plan - 10/01/18 1148    Clinical Impression Statement  Patient had left RC repair 7 months ago, she stopped PT after the evaluation due to the MD really wanting to go slow with this shoulder as it was 3 tears.  She comes in very anxious and sore, and stiff.  Worried aobut movements and activity.  Needs a lot of reassurance that we are 7 weeks out and will go slow.  She had pain with the first motions but once she got moving she really did not c/o pain    PT Next Visit Plan  7 weeks in on protocol    Consulted and Agree with Plan of Care  Patient       Patient will benefit from skilled therapeutic intervention in order to improve the following deficits and impairments:  Decreased range of motion, Impaired UE functional use, Increased muscle spasms, Decreased activity tolerance, Pain, Improper body mechanics, Decreased strength, Increased edema, Postural dysfunction  Visit Diagnosis: Acute pain of left shoulder  Stiffness of left shoulder, not elsewhere classified  Cramp and spasm  Localized edema     Problem List There are no active problems to display for this patient.   Jearld LeschALBRIGHT,Ethelmae Ringel W., PT 10/01/2018, 11:50 AM  [facility] [place] [facility] [address] [place],  [place], [address] Phone: [phone]   Fax:  [phone]  Name: Jayah Balthazar MRN: [id number] Date of Birth: 05-Feb-1941

## 2018-10-04 ENCOUNTER — Ambulatory Visit: Payer: Medicare Other | Admitting: Physical Therapy

## 2018-10-04 ENCOUNTER — Encounter: Payer: Self-pay | Admitting: Physical Therapy

## 2018-10-04 DIAGNOSIS — R252 Cramp and spasm: Secondary | ICD-10-CM

## 2018-10-04 DIAGNOSIS — M25512 Pain in left shoulder: Secondary | ICD-10-CM | POA: Diagnosis not present

## 2018-10-04 DIAGNOSIS — M25612 Stiffness of left shoulder, not elsewhere classified: Secondary | ICD-10-CM

## 2018-10-04 DIAGNOSIS — R600 Localized edema: Secondary | ICD-10-CM

## 2018-10-04 NOTE — Therapy (Signed)
[facility] [place] [facility] [address] [place], [place], [address] Phone: [phone]   Fax:  [phone]  Physical Therapy Treatment  Patient Details  Name: Anora Schwenke MRN: [id number] Date of Birth: 07-14-1941 Referring Provider (PT): Leonel Ramsay   Encounter Date: 10/04/2018  PT End of Session - 10/04/18 1009    Visit Number  3    Date for PT Re-Evaluation  10/19/18    PT Start Time  0929    PT Stop Time  1026    PT Time Calculation (min)  57 min    Activity Tolerance  Patient tolerated treatment well    Behavior During Therapy  [facility] for tasks assessed/performed;Anxious       Past Medical History:  Diagnosis Date  . Depression   . Hyperlipemia   . Osteoporosis     Past Surgical History:  Procedure Laterality Date  . ABDOMINAL HYSTERECTOMY    . BREAST SURGERY      There were no vitals filed for this visit.  Subjective Assessment - 10/04/18 0926    Subjective  Patient reports that she hit her left shoulder on the point of the car door yesterday.  She reports that she has had some increased pain    Currently in Pain?  Yes    Pain Score  3     Pain Location  Shoulder    Pain Orientation  Left    Pain Descriptors / Indicators  Aching    Aggravating Factors   hitting arm on door         [facility] PT Assessment - 10/04/18 0001      PROM   Left Shoulder Flexion  110 Degrees                   [facility] Adult PT Treatment/Exercise - 10/04/18 0001      Shoulder Exercises: Seated   Other Seated Exercises  AAROM/PROM for flexion and ER    Other Seated Exercises  money exercises      Shoulder Exercises: Standing   Extension  20 reps;Theraband    Theraband Level (Shoulder Extension)  Level 1 (Yellow)    Retraction  20 reps;Theraband    Theraband Level (Shoulder Retraction)  Level 2 (Red)    Other Standing Exercises  ball rolling    Other Standing Exercises  wand exercises for extension and IR, then assist with  flexion      Shoulder Exercises: ROM/Strengthening   UBE (Upper Arm Bike)  level 1 x 4 minutes    Other ROM/Strengthening Exercises  shoulder shrugs, scapular retraction      Modalities   Modalities  Vasopneumatic      Vasopneumatic   Number Minutes Vasopneumatic   15 minutes    Vasopnuematic Location   Shoulder    Vasopneumatic Pressure  Medium    Vasopneumatic Temperature   34      Manual Therapy   Manual Therapy  Passive ROM    Passive ROM  PROM of the left shoulder all GH jpoint motions with end range               PT Short Term Goals - 10/01/18 1149      PT SHORT TERM GOAL #1   Title  independent with initial HEP    Status  On-going        PT Long Term Goals - 08/19/18 1453      PT LONG TERM GOAL #1   Title  increase AROM of left shoulder flexion to 130 degrees    Time  12    Period  Weeks    Status  New      PT LONG TERM GOAL #2   Title  increase ER of the leftshoulder to 70 degrees    Time  12    Period  Weeks    Status  New      PT LONG TERM GOAL #3   Title  decrease pain 50%    Time  12    Period  Weeks    Status  New      PT LONG TERM GOAL #4   Title  dress and do hair without difficulty    Time  12    Period  Weeks    Status  New      PT LONG TERM GOAL #5   Title  lift 3# into head high cabinet    Time  16    Period  Weeks    Status  New            Plan - 10/04/18 1010    Clinical Impression Statement  Patient made great gains in PROM over the past few days, I feel that she was just anxious and gaurded.  She did hit her shoulder on the car door and reports some increased shoulder pain and upper trap pain.    PT Next Visit Plan  7 weeks in on protocol    Consulted and Agree with Plan of Care  Patient       Patient will benefit from skilled therapeutic intervention in order to improve the following deficits and impairments:  Decreased range of motion, Impaired UE functional use, Increased muscle spasms, Decreased activity  tolerance, Pain, Improper body mechanics, Decreased strength, Increased edema, Postural dysfunction  Visit Diagnosis: Acute pain of left shoulder  Stiffness of left shoulder, not elsewhere classified  Cramp and spasm  Localized edema     Problem List There are no active problems to display for this patient.   Jearld LeschALBRIGHT,Josef Tourigny W., PT 10/04/2018, 10:11 AM  [facility] [place] [facility] [address] [place], [place], [address] Phone: [phone]   Fax:  [phone]  Name: Rollene Fareancy Ebeling MRN: [id number] Date of Birth: Aug 16, 1941

## 2018-10-07 ENCOUNTER — Encounter: Payer: Self-pay | Admitting: Physical Therapy

## 2018-10-07 ENCOUNTER — Ambulatory Visit: Payer: Medicare Other | Admitting: Physical Therapy

## 2018-10-07 DIAGNOSIS — M25512 Pain in left shoulder: Secondary | ICD-10-CM | POA: Diagnosis not present

## 2018-10-07 DIAGNOSIS — R252 Cramp and spasm: Secondary | ICD-10-CM

## 2018-10-07 DIAGNOSIS — R600 Localized edema: Secondary | ICD-10-CM

## 2018-10-07 DIAGNOSIS — M25612 Stiffness of left shoulder, not elsewhere classified: Secondary | ICD-10-CM

## 2018-10-07 NOTE — Therapy (Signed)
[facility] [place] [facility] [address] [place], [place], [address] Phone: [phone]   Fax:  [phone]  Physical Therapy Treatment  Patient Details  Name: Kim Grant MRN: [id number] Date of Birth: 1940/11/22 Referring Provider (PT): Leonel Ramsay   Encounter Date: 10/07/2018  PT End of Session - 10/07/18 1344    Visit Number  4    Date for PT Re-Evaluation  10/19/18    PT Start Time  1303    PT Stop Time  1352    PT Time Calculation (min)  49 min    Activity Tolerance  Patient tolerated treatment well    Behavior During Therapy  [facility] for tasks assessed/performed;Anxious       Past Medical History:  Diagnosis Date  . Depression   . Hyperlipemia   . Osteoporosis     Past Surgical History:  Procedure Laterality Date  . ABDOMINAL HYSTERECTOMY    . BREAST SURGERY      There were no vitals filed for this visit.  Subjective Assessment - 10/07/18 1308    Subjective  Patient reports that she is having left upper arm pain today, unsure why    Currently in Pain?  Yes    Pain Score  4     Pain Location  Shoulder    Pain Orientation  Left    Pain Descriptors / Indicators  Aching;Sore                       [facility] Adult PT Treatment/Exercise - 10/07/18 0001      Shoulder Exercises: Supine   External Rotation  AAROM;20 reps    Internal Rotation  AAROM;20 reps    Flexion  AAROM;20 reps    ABduction  AAROM;Left;20 reps    Other Supine Exercises  isometric circles      Shoulder Exercises: Seated   Other Seated Exercises  money exercises      Shoulder Exercises: Standing   Other Standing Exercises  ball rolling, yellow tband tricep extension    Other Standing Exercises  wand exercises for extension and IR, then assist with flexion      Shoulder Exercises: ROM/Strengthening   UBE (Upper Arm Bike)  level 1 x 4 minutes    Other ROM/Strengthening Exercises  shoulder shrugs, scapular retraction      Modalities    Modalities  Vasopneumatic      Vasopneumatic   Number Minutes Vasopneumatic   15 minutes    Vasopnuematic Location   Shoulder    Vasopneumatic Pressure  Medium    Vasopneumatic Temperature   34      Manual Therapy   Manual Therapy  Passive ROM    Passive ROM  PROM of the left shoulder all GH jpoint motions with end range               PT Short Term Goals - 10/01/18 1149      PT SHORT TERM GOAL #1   Title  independent with initial HEP    Status  On-going        PT Long Term Goals - 08/19/18 1453      PT LONG TERM GOAL #1   Title  increase AROM of left shoulder flexion to 130 degrees    Time  12    Period  Weeks    Status  New      PT LONG TERM GOAL #2   Title  increase ER of the  leftshoulder to 70 degrees    Time  12    Period  Weeks    Status  New      PT LONG TERM GOAL #3   Title  decrease pain 50%    Time  12    Period  Weeks    Status  New      PT LONG TERM GOAL #4   Title  dress and do hair without difficulty    Time  12    Period  Weeks    Status  New      PT LONG TERM GOAL #5   Title  lift 3# into head high cabinet    Time  16    Period  Weeks    Status  New            Plan - 10/07/18 1344    Clinical Impression Statement  Patient continues to be gaurded and cautious with motions and PROM.  She is improving with her ability to tolerate the motions but remains hesitant and guards.  She has some increaesd left upper arm pain today, she reports that she does not know why    PT Next Visit Plan  will start week 8 next visit    Consulted and Agree with Plan of Care  Patient       Patient will benefit from skilled therapeutic intervention in order to improve the following deficits and impairments:  Decreased range of motion, Impaired UE functional use, Increased muscle spasms, Decreased activity tolerance, Pain, Improper body mechanics, Decreased strength, Increased edema, Postural dysfunction  Visit Diagnosis: Acute pain of left  shoulder  Stiffness of left shoulder, not elsewhere classified  Cramp and spasm  Localized edema     Problem List There are no active problems to display for this patient.   Jearld LeschALBRIGHT,Jeryn Cerney W., PT 10/07/2018, 1:50 PM  [facility] [place] [facility] [address] [place], [place], [address] Phone: [phone]   Fax:  [phone]  Name: Kim Grant MRN: [id number] Date of Birth: July 30, 1941

## 2018-10-11 ENCOUNTER — Encounter: Payer: Self-pay | Admitting: Physical Therapy

## 2018-10-11 ENCOUNTER — Ambulatory Visit: Payer: Medicare Other | Admitting: Physical Therapy

## 2018-10-11 DIAGNOSIS — R600 Localized edema: Secondary | ICD-10-CM

## 2018-10-11 DIAGNOSIS — M25612 Stiffness of left shoulder, not elsewhere classified: Secondary | ICD-10-CM

## 2018-10-11 DIAGNOSIS — M25512 Pain in left shoulder: Secondary | ICD-10-CM | POA: Diagnosis not present

## 2018-10-11 DIAGNOSIS — R252 Cramp and spasm: Secondary | ICD-10-CM

## 2018-10-11 NOTE — Therapy (Signed)
[facility] [address] [place], [place], [address] Phone: [phone]   Fax:  [phone]  Physical Therapy Treatment  Patient Details  Name: Kim Grant MRN: [id number] Date of Birth: 16-Feb-1941 Referring Provider (PT): Leonel RamsayFelder   Encounter Date: 10/11/2018  PT End of Session - 10/11/18 1136    Visit Number  5    Date for PT Re-Evaluation  10/19/18    PT Start Time  1054    PT Stop Time  1144    PT Time Calculation (min)  50 min    Activity Tolerance  Patient tolerated treatment well    Behavior During Therapy  [facility] for tasks assessed/performed;Anxious       Past Medical History:  Diagnosis Date  . Depression   . Hyperlipemia   . Osteoporosis     Past Surgical History:  Procedure Laterality Date  . ABDOMINAL HYSTERECTOMY    . BREAST SURGERY      There were no vitals filed for this visit.  Subjective Assessment - 10/11/18 1055    Subjective  Patient reports continued left upper arm "ache" and "soreness".  She reports that she has been very active.    Currently in Pain?  Yes    Pain Score  4     Pain Location  Shoulder    Pain Orientation  Left;Lateral    Pain Descriptors / Indicators  Aching    Aggravating Factors   just being active         [facility] - 10/11/18 0001      PROM   Left Shoulder Flexion  118 Degrees    Left Shoulder ABduction  100 Degrees    Left Shoulder Internal Rotation  50 Degrees    Left Shoulder External Rotation  27 Degrees                   [facility] Adult PT Treatment/Exercise - 10/11/18 0001      Shoulder Exercises: Standing   External Rotation  Left;20 reps;Theraband    Theraband Level (Shoulder External Rotation)  Level 1 (Yellow)    Internal Rotation  Left;20 reps;Theraband    Theraband Level (Shoulder Internal Rotation)  Level 1 (Yellow)    Extension  20 reps;Theraband    Theraband Level (Shoulder Extension)  Level 1 (Yellow)    Row  Both;20  reps;Theraband    Theraband Level (Shoulder Row)  Level 1 (Yellow)    Other Standing Exercises  ball rolling, yellow tband tricep extension    Other Standing Exercises  wand for flexion, gentle extension      Shoulder Exercises: ROM/Strengthening   UBE (Upper Arm Bike)  level 1 x 5 minutes all forward due to pain going backwards    Other ROM/Strengthening Exercises  shoulder shrugs, scapular retraction      Modalities   Modalities  Electrical Stimulation;Vasopneumatic      Electrical Stimulation   Electrical Stimulation Location  left lateral shoulder    Electrical Stimulation Action  IFC    Electrical Stimulation Parameters  sitting    Electrical Stimulation Goals  Pain      Vasopneumatic   Number Minutes Vasopneumatic   15 minutes    Vasopnuematic Location   Shoulder    Vasopneumatic Pressure  Medium    Vasopneumatic Temperature   34      Manual Therapy   Manual Therapy  Passive ROM    Passive ROM  PROM of the left shoulder  all GH jpoint motions with end range               PT Short Term Goals - 10/11/18 1138      PT SHORT TERM GOAL #1   Title  independent with initial HEP    Status  Achieved        PT Long Term Goals - 08/19/18 1453      PT LONG TERM GOAL #1   Title  increase AROM of left shoulder flexion to 130 degrees    Time  12    Period  Weeks    Status  New      PT LONG TERM GOAL #2   Title  increase ER of the leftshoulder to 70 degrees    Time  12    Period  Weeks    Status  New      PT LONG TERM GOAL #3   Title  decrease pain 50%    Time  12    Period  Weeks    Status  New      PT LONG TERM GOAL #4   Title  dress and do hair without difficulty    Time  12    Period  Weeks    Status  New      PT LONG TERM GOAL #5   Title  lift 3# into head high cabinet    Time  16    Period  Weeks    Status  New            Plan - 10/11/18 1137    Clinical Impression Statement  Patient with improving ROM, she does c/o increased soreness  with the activity and the PROM, pain and tenderness are in the lateral shoulder deltoid and bicep.  Started estim today to see if we could decrease the pain and soreness.  She is starting to allow better PROM    PT Next Visit Plan  will start week 8 next visit    Consulted and Agree with Plan of Care  Patient       Patient will benefit from skilled therapeutic intervention in order to improve the following deficits and impairments:  Decreased range of motion, Impaired UE functional use, Increased muscle spasms, Decreased activity tolerance, Pain, Improper body mechanics, Decreased strength, Increased edema, Postural dysfunction  Visit Diagnosis: Acute pain of left shoulder  Stiffness of left shoulder, not elsewhere classified  Cramp and spasm  Localized edema     Problem List There are no active problems to display for this patient.   Jearld LeschALBRIGHT,Brevin Mcfadden W., PT 10/11/2018, 11:38 AM  [facility] [address] [place], [place], [address] Phone: [phone]   Fax:  [phone]  Name: Kim Grant MRN: [id number] Date of Birth: May 17, 1941

## 2018-10-14 ENCOUNTER — Ambulatory Visit: Payer: Medicare Other | Admitting: Physical Therapy

## 2018-10-14 ENCOUNTER — Encounter: Payer: Self-pay | Admitting: Physical Therapy

## 2018-10-14 DIAGNOSIS — M25512 Pain in left shoulder: Secondary | ICD-10-CM | POA: Diagnosis not present

## 2018-10-14 DIAGNOSIS — M25612 Stiffness of left shoulder, not elsewhere classified: Secondary | ICD-10-CM

## 2018-10-14 DIAGNOSIS — R600 Localized edema: Secondary | ICD-10-CM

## 2018-10-14 DIAGNOSIS — R252 Cramp and spasm: Secondary | ICD-10-CM

## 2018-10-14 NOTE — Therapy (Signed)
[facility] [address] [place], [place], [address] Phone: [phone]   Fax:  [phone]  Physical Therapy Treatment  Patient Details  Name: Kim Grant MRN: [id number] Date of Birth: 1941/04/16 Referring Provider (PT): Leonel RamsayFelder   Encounter Date: 10/14/2018  PT End of Session - 10/14/18 1133    Visit Number  6    Date for PT Re-Evaluation  10/19/18    PT Start Time  1058    PT Stop Time  1154    PT Time Calculation (min)  56 min    Activity Tolerance  Patient tolerated treatment well    Behavior During Therapy  [facility] for tasks assessed/performed;Anxious       Past Medical History:  Diagnosis Date  . Depression   . Hyperlipemia   . Osteoporosis     Past Surgical History:  Procedure Laterality Date  . ABDOMINAL HYSTERECTOMY    . BREAST SURGERY      There were no vitals filed for this visit.  Subjective Assessment - 10/14/18 1058    Subjective  Patient reports a "rough" weekend, reports tightness and tension in the upper traps and neck    Currently in Pain?  Yes    Pain Score  6     Pain Location  Neck    Pain Descriptors / Indicators  Aching;Sore;Tightness                       [facility] Adult PT Treatment/Exercise - 10/14/18 0001      Shoulder Exercises: Standing   External Rotation  Left;20 reps;Theraband    Theraband Level (Shoulder External Rotation)  Level 1 (Yellow)    Internal Rotation  Left;20 reps;Theraband    Theraband Level (Shoulder Internal Rotation)  Level 1 (Yellow)    Extension  20 reps;Theraband    Theraband Level (Shoulder Extension)  Level 1 (Yellow)    Row  Both;20 reps;Theraband    Theraband Level (Shoulder Row)  Level 1 (Yellow)    Other Standing Exercises  ball rolling, yellow tband tricep extension    Other Standing Exercises  wand for flexion, gentle extension      Shoulder Exercises: ROM/Strengthening   UBE (Upper Arm Bike)  Level 1 x 6 minuts Fwd and bkwd    Other ROM/Strengthening Exercises  shoulder shrugs, scapular retraction      Electrical Stimulation   Electrical Stimulation Location  biltateral cervical and upper trap    Electrical Stimulation Action  IFC    Electrical Stimulation Parameters  sitting    Electrical Stimulation Goals  Pain      Manual Therapy   Manual Therapy  Passive ROM;Soft tissue mobilization    Soft tissue mobilization  to the cervical spine and the upper trap to the rhomboids    Passive ROM  PROM of the left shoulder all GH jpoint motions with end range               PT Short Term Goals - 10/11/18 1138      PT SHORT TERM GOAL #1   Title  independent with initial HEP    Status  Achieved        PT Long Term Goals - 08/19/18 1453      PT LONG TERM GOAL #1   Title  increase AROM of left shoulder flexion to 130 degrees    Time  12    Period  Weeks    Status  New      PT LONG TERM GOAL #2   Title  increase ER of the leftshoulder to 70 degrees    Time  12    Period  Weeks    Status  New      PT LONG TERM GOAL #3   Title  decrease pain 50%    Time  12    Period  Weeks    Status  New      PT LONG TERM GOAL #4   Title  dress and do hair without difficulty    Time  12    Period  Weeks    Status  New      PT LONG TERM GOAL #5   Title  lift 3# into head high cabinet    Time  16    Period  Weeks    Status  New            Plan - 10/14/18 1134    Clinical Impression Statement  Patient reports that she has been doing a lot of cooking and cleaning, reports that she is very sore and tired.  She has increased palpable spasms and knots in the neck and the upper traps, she is tender here and into the rhomboids.  PROM was very tight at first but seemed to wase up and allow more ROM with less pain    PT Next Visit Plan  week 8    Consulted and Agree with Plan of Care  Patient       Patient will benefit from skilled therapeutic intervention in order to improve the following deficits and  impairments:  Decreased range of motion, Impaired UE functional use, Increased muscle spasms, Decreased activity tolerance, Pain, Improper body mechanics, Decreased strength, Increased edema, Postural dysfunction  Visit Diagnosis: Acute pain of left shoulder  Stiffness of left shoulder, not elsewhere classified  Cramp and spasm  Localized edema     Problem List There are no active problems to display for this patient.   Jearld LeschALBRIGHT,Oriyah Lamphear W., PT 10/14/2018, 11:38 AM  [facility] [address] [place], [place], [address] Phone: [phone]   Fax:  [phone]  Name: Kim Grant MRN: [id number] Date of Birth: [birth year]

## 2018-10-17 ENCOUNTER — Ambulatory Visit: Payer: Medicare Other | Admitting: Physical Therapy

## 2018-10-22 ENCOUNTER — Ambulatory Visit: Payer: Medicare Other | Admitting: Physical Therapy

## 2018-10-22 ENCOUNTER — Encounter: Payer: Self-pay | Admitting: Physical Therapy

## 2018-10-22 DIAGNOSIS — R252 Cramp and spasm: Secondary | ICD-10-CM

## 2018-10-22 DIAGNOSIS — R600 Localized edema: Secondary | ICD-10-CM

## 2018-10-22 DIAGNOSIS — M25612 Stiffness of left shoulder, not elsewhere classified: Secondary | ICD-10-CM

## 2018-10-22 DIAGNOSIS — M25512 Pain in left shoulder: Secondary | ICD-10-CM | POA: Diagnosis not present

## 2018-10-22 NOTE — Therapy (Signed)
[place] [address] [place], [place], [address] Phone: [phone]   Fax:  [phone]  Physical Therapy Treatment  Patient Details  Name: Kim Grant MRN: [id number] Date of Birth: 10-19-1941 Referring Provider (PT): Davina Poke   Encounter Date: 10/22/2018  PT End of Session - 10/22/18 1515    Visit Number  7    Date for PT Re-Evaluation  11/22/18    PT Start Time  0175    PT Stop Time  1530    PT Time Calculation (min)  57 min    Activity Tolerance  Patient tolerated treatment well    Behavior During Therapy  [facility] for tasks assessed/performed;Anxious       Past Medical History:  Diagnosis Date  . Depression   . Hyperlipemia   . Osteoporosis     Past Surgical History:  Procedure Laterality Date  . ABDOMINAL HYSTERECTOMY    . BREAST SURGERY      There were no vitals filed for this visit.  Subjective Assessment - 10/22/18 1443    Subjective  Patient reports that she was not able to come in last week and was very active, she reports increase pain in the left shoulder, she also feels that she may have slept on it wrong    Currently in Pain?  Yes    Pain Score  6     Pain Location  Shoulder    Pain Orientation  Left    Aggravating Factors   slept on it wrong    Pain Relieving Factors  the last treatment helped         [facility] PT Assessment - 10/22/18 0001      Assessment   Medical Diagnosis  s/p left RC repair and biceps repair    Referring Provider (PT)  Felder      PROM   Left Shoulder Flexion  120 Degrees    Left Shoulder ABduction  105 Degrees    Left Shoulder Internal Rotation  50 Degrees    Left Shoulder External Rotation  35 Degrees                   [facility] Adult PT Treatment/Exercise - 10/22/18 0001      Shoulder Exercises: Standing   External Rotation  Left;20 reps;Theraband    Theraband Level (Shoulder External Rotation)  Level 1 (Yellow)    Internal Rotation  Left;20  reps;Theraband    Theraband Level (Shoulder Internal Rotation)  Level 2 (Red)    Extension  20 reps;Theraband    Theraband Level (Shoulder Extension)  Level 2 (Red)    Row  Both;20 reps;Theraband    Theraband Level (Shoulder Row)  Level 2 (Red)    Other Standing Exercises  ball rolling, red tband tricep extension    Other Standing Exercises  wand for flexion, gentle extension      Shoulder Exercises: ROM/Strengthening   UBE (Upper Arm Bike)  Level 2 x 6 minuts Fwd and bkwd      Electrical Stimulation   Electrical Stimulation Location  left cervical to the left shoulder/upper arm    Electrical Stimulation Action  IFC    Electrical Stimulation Parameters  sitting pain    Electrical Stimulation Goals  Pain      Manual Therapy   Manual Therapy  Passive ROM;Soft tissue mobilization    Soft tissue mobilization  to the cervical spine and the upper trap to the rhomboids and into the left upper  arm    Passive ROM  PROM of the left shoulder all Foley jpoint motions with end range               PT Short Term Goals - 10/11/18 1138      PT SHORT TERM GOAL #1   Title  independent with initial HEP    Status  Achieved        PT Long Term Goals - 10/22/18 1517      PT LONG TERM GOAL #1   Title  increase AROM of left shoulder flexion to 130 degrees    Status  Partially Met      PT LONG TERM GOAL #2   Title  increase ER of the leftshoulder to 70 degrees    Status  Partially Met      PT LONG TERM GOAL #3   Title  decrease pain 50%    Status  Partially Met      PT LONG TERM GOAL #4   Title  dress and do hair without difficulty    Status  On-going      PT LONG TERM GOAL #5   Title  lift 3# into head high cabinet    Status  On-going            Plan - 10/22/18 1515    Clinical Impression Statement  Patient has continue wtih increased left shoulder and neck pain, she feels it is from her doing more work and sleeping wrong.  Her PROM is still very good, she is having some  c/o stiffness with the reaching activities    PT Next Visit Plan  wwek 10 of the protocol    Consulted and Agree with Plan of Care  Patient       Patient will benefit from skilled therapeutic intervention in order to improve the following deficits and impairments:  Decreased range of motion, Impaired UE functional use, Increased muscle spasms, Decreased activity tolerance, Pain, Improper body mechanics, Decreased strength, Increased edema, Postural dysfunction  Visit Diagnosis: Acute pain of left shoulder - Plan: PT plan of care cert/re-cert  Stiffness of left shoulder, not elsewhere classified - Plan: PT plan of care cert/re-cert  Cramp and spasm - Plan: PT plan of care cert/re-cert  Localized edema - Plan: PT plan of care cert/re-cert     Problem List There are no active problems to display for this patient.   Sumner Boast., PT 10/22/2018, 3:19 PM  [place], [place], [address] Phone: [phone]   Fax:  [phone]  Name: Kim Grant MRN: [id number] Date of Birth: 14-Nov-1940

## 2018-10-25 ENCOUNTER — Ambulatory Visit: Payer: Medicare Other | Attending: Sports Medicine | Admitting: Physical Therapy

## 2018-10-25 ENCOUNTER — Encounter: Payer: Self-pay | Admitting: Physical Therapy

## 2018-10-25 DIAGNOSIS — R600 Localized edema: Secondary | ICD-10-CM | POA: Diagnosis present

## 2018-10-25 DIAGNOSIS — M25612 Stiffness of left shoulder, not elsewhere classified: Secondary | ICD-10-CM

## 2018-10-25 DIAGNOSIS — R252 Cramp and spasm: Secondary | ICD-10-CM

## 2018-10-25 DIAGNOSIS — M25512 Pain in left shoulder: Secondary | ICD-10-CM | POA: Diagnosis not present

## 2018-10-25 NOTE — Therapy (Signed)
[place] [address] [place], [place], [address] Phone: [phone]   Fax:  [phone]  Physical Therapy Treatment  Patient Details  Name: Kim Grant MRN: [id number] Date of Birth: [birth year] Referring Provider (PT): Davina Poke   Encounter Date: 10/25/2018  PT End of Session - 10/25/18 1051    Visit Number  8    Date for PT Re-Evaluation  11/22/18    PT Start Time  1010    PT Stop Time  1105    PT Time Calculation (min)  55 min    Activity Tolerance  Patient tolerated treatment well    Behavior During Therapy  [facility] for tasks assessed/performed;Anxious       Past Medical History:  Diagnosis Date  . Depression   . Hyperlipemia   . Osteoporosis     Past Surgical History:  Procedure Laterality Date  . ABDOMINAL HYSTERECTOMY    . BREAST SURGERY      There were no vitals filed for this visit.  Subjective Assessment - 10/25/18 1018    Subjective  I really felt good after the last treatment    Currently in Pain?  Yes    Pain Score  4     Pain Location  Shoulder    Pain Orientation  Left                       [facility] Adult PT Treatment/Exercise - 10/25/18 0001      Shoulder Exercises: Standing   External Rotation  Left;20 reps;Theraband    Theraband Level (Shoulder External Rotation)  Level 1 (Yellow)    Internal Rotation  Left;20 reps;Theraband    Theraband Level (Shoulder Internal Rotation)  Level 2 (Red)    Extension  20 reps;Theraband    Theraband Level (Shoulder Extension)  Level 2 (Red)    Row  Both;20 reps;Theraband    Theraband Level (Shoulder Row)  Level 2 (Red)    Other Standing Exercises  ball rolling, red tband tricep extension      Shoulder Exercises: ROM/Strengthening   UBE (Upper Arm Bike)  Level 2 x 6 minuts Fwd and bkwd    Wall Wash  flexion, circles and scaption    "W" Arms  20      Shoulder Exercises: Stretch   Corner Stretch  3 reps;10 seconds    Star Gazer Stretch   2 reps;20 seconds      Acupuncturist Location  left lateral shoulder    Electrical Stimulation Action  IFX    Electrical Stimulation Parameters  sitting    Electrical Stimulation Goals  Pain      Manual Therapy   Manual Therapy  Passive ROM;Soft tissue mobilization    Soft tissue mobilization  to the cervical spine and the upper trap to the rhomboids and into the left upper arm    Passive ROM  PROM of the left shoulder all GH jpoint motions with end range               PT Short Term Goals - 10/11/18 1138      PT SHORT TERM GOAL #1   Title  independent with initial HEP    Status  Achieved        PT Long Term Goals - 10/22/18 1517      PT LONG TERM GOAL #1   Title  increase AROM of left shoulder flexion to 130 degrees  Status  Partially Met      PT LONG TERM GOAL #2   Title  increase ER of the leftshoulder to 70 degrees    Status  Partially Met      PT LONG TERM GOAL #3   Title  decrease pain 50%    Status  Partially Met      PT LONG TERM GOAL #4   Title  dress and do hair without difficulty    Status  On-going      PT LONG TERM GOAL #5   Title  lift 3# into head high cabinet    Status  On-going            Plan - 10/25/18 1051    Clinical Impression Statement  Patient reports feeling better from last time, reports that she thought it really helped.  Her ROM is getting better passively, actively she still has compensatory patterns.  Some knots and tenderness in the lateral shoulder    PT Next Visit Plan  next week will be week 11    Consulted and Agree with Plan of Care  Patient       Patient will benefit from skilled therapeutic intervention in order to improve the following deficits and impairments:  Decreased range of motion, Impaired UE functional use, Increased muscle spasms, Decreased activity tolerance, Pain, Improper body mechanics, Decreased strength, Increased edema, Postural dysfunction  Visit  Diagnosis: Acute pain of left shoulder  Stiffness of left shoulder, not elsewhere classified  Cramp and spasm  Localized edema     Problem List There are no active problems to display for this patient.   ALBRIGHT,MICHAEL W., PT 10/25/2018, 10:53 AM  [facility] [address] [place], [place], [address] Phone: [phone]   Fax:  [phone]  Name: Kim Grant MRN: [id number] Date of Birth: 10/23/1940   

## 2018-10-29 ENCOUNTER — Ambulatory Visit: Payer: Medicare Other | Admitting: Physical Therapy

## 2018-10-29 ENCOUNTER — Encounter: Payer: Self-pay | Admitting: Physical Therapy

## 2018-10-29 DIAGNOSIS — M25512 Pain in left shoulder: Secondary | ICD-10-CM

## 2018-10-29 DIAGNOSIS — M25612 Stiffness of left shoulder, not elsewhere classified: Secondary | ICD-10-CM

## 2018-10-29 DIAGNOSIS — R600 Localized edema: Secondary | ICD-10-CM

## 2018-10-29 DIAGNOSIS — R252 Cramp and spasm: Secondary | ICD-10-CM

## 2018-10-29 NOTE — Therapy (Signed)
[place] [address] [address] [place], [place], [address] Phone: [phone]   Fax:  [phone]  Physical Therapy Treatment  Patient Details  Name: Kim Grant MRN: [id number] Date of Birth: [birth year] Referring Provider (PT): Davina Poke   Encounter Date: 10/29/2018  PT End of Session - 10/29/18 1445    Visit Number  9    Date for PT Re-Evaluation  11/22/18    PT Start Time  3532    PT Stop Time  1553    PT Time Calculation (min)  120 min    Activity Tolerance  Patient tolerated treatment well    Behavior During Therapy  [facility] for tasks assessed/performed;Anxious       Past Medical History:  Diagnosis Date  . Depression   . Hyperlipemia   . Osteoporosis     Past Surgical History:  Procedure Laterality Date  . ABDOMINAL HYSTERECTOMY    . BREAST SURGERY      There were no vitals filed for this visit.  Subjective Assessment - 10/29/18 1406    Subjective  I was a little sore in the shoulders    Currently in Pain?  Yes    Pain Score  4     Pain Location  Shoulder    Pain Orientation  Left    Pain Relieving Factors  treatment helps                       [facility] Adult PT Treatment/Exercise - 10/29/18 0001      Shoulder Exercises: Supine   Other Supine Exercises  1# and 2# punches, then isometric circels      Shoulder Exercises: Standing   External Rotation  Left;20 reps;Theraband    Theraband Level (Shoulder External Rotation)  Level 1 (Yellow)    Internal Rotation  Left;20 reps;Theraband    Theraband Level (Shoulder Internal Rotation)  Level 2 (Red)    Extension  20 reps;Theraband    Theraband Level (Shoulder Extension)  Level 2 (Red)    Row  Both;20 reps;Theraband    Theraband Level (Shoulder Row)  Level 2 (Red)    Other Standing Exercises  walking holding arm over head, 1# cabinet reaching various levels    Other Standing Exercises  yellow tband biceps, red tband tricep extnesion      Shoulder  Exercises: ROM/Strengthening   UBE (Upper Arm Bike)  Level 4 x 6 minuts Fwd and bkwd    Wall Wash  flexion, circles and scaption    "W" Arms  20      Electrical Stimulation   Electrical Stimulation Location  left lateral shoulder    Electrical Stimulation Action  IFC    Electrical Stimulation Parameters  sitting    Electrical Stimulation Goals  Pain      Manual Therapy   Manual Therapy  Passive ROM;Soft tissue mobilization    Passive ROM  PROM all GH joint motions, gentle contract relax               PT Short Term Goals - 10/11/18 1138      PT SHORT TERM GOAL #1   Title  independent with initial HEP    Status  Achieved        PT Long Term Goals - 10/29/18 1448      PT LONG TERM GOAL #1   Title  increase AROM of left shoulder flexion to 130 degrees    Status  Partially Met  PT LONG TERM GOAL #2   Title  increase ER of the leftshoulder to 70 degrees    Status  Partially Met      PT LONG TERM GOAL #3   Title  decrease pain 50%    Status  Partially Met      PT LONG TERM GOAL #4   Title  dress and do hair without difficulty    Status  Partially Met            Plan - 10/29/18 1446    Clinical Impression Statement  Patient continues to have pain with reaching, it is usually the first few times and then it gets less pain and better quality motions.  She still is having some spasms and knots in the upper traps, she needs cues to do exercises correctly.  The protocol has been limiting but we are starting to add strength and function as tolerated    PT Next Visit Plan  week 11 and working slowly due to the MD wanting to start PT late due to the multi tendon tears    Consulted and Agree with Plan of Care  Patient       Patient will benefit from skilled therapeutic intervention in order to improve the following deficits and impairments:  Decreased range of motion, Impaired UE functional use, Increased muscle spasms, Decreased activity tolerance, Pain, Improper  body mechanics, Decreased strength, Increased edema, Postural dysfunction  Visit Diagnosis: Acute pain of left shoulder  Stiffness of left shoulder, not elsewhere classified  Cramp and spasm  Localized edema     Problem List There are no active problems to display for this patient.   Sumner Boast., PT 10/29/2018, 2:49 PM  [place], [place], [address] Phone: [phone]   Fax:  [phone]  Name: Freddy Spadafora MRN: [id number] Date of Birth: April 25, 1941

## 2018-11-01 ENCOUNTER — Encounter: Payer: Self-pay | Admitting: Physical Therapy

## 2018-11-01 ENCOUNTER — Ambulatory Visit: Payer: Medicare Other | Admitting: Physical Therapy

## 2018-11-01 DIAGNOSIS — R252 Cramp and spasm: Secondary | ICD-10-CM

## 2018-11-01 DIAGNOSIS — R600 Localized edema: Secondary | ICD-10-CM

## 2018-11-01 DIAGNOSIS — M25512 Pain in left shoulder: Secondary | ICD-10-CM | POA: Diagnosis not present

## 2018-11-01 DIAGNOSIS — M25612 Stiffness of left shoulder, not elsewhere classified: Secondary | ICD-10-CM

## 2018-11-01 NOTE — Therapy (Signed)
[place], [place], [address] Phone: [phone]   Fax:  [phone]  Physical Therapy Treatment  Patient Details  Name: Kim Grant MRN: [id number] Date of Birth: [birth year] Referring Provider (PT): Davina Poke   Encounter Date: 11/01/2018  PT End of Session - 11/01/18 1140    Visit Number  10    Date for PT Re-Evaluation  11/22/18    PT Start Time  6962    PT Stop Time  1155    PT Time Calculation (min)  64 min    Activity Tolerance  Patient tolerated treatment well    Behavior During Therapy  [facility] for tasks assessed/performed;Anxious       Past Medical History:  Diagnosis Date  . Depression   . Hyperlipemia   . Osteoporosis     Past Surgical History:  Procedure Laterality Date  . ABDOMINAL HYSTERECTOMY    . BREAST SURGERY      There were no vitals filed for this visit.  Subjective Assessment - 11/01/18 1101    Subjective  Patient reports that she washed her hair by herself yesterday for the first time and is sore    Currently in Pain?  Yes    Pain Score  5     Pain Location  Shoulder    Pain Orientation  Left         [facility] PT Assessment - 11/01/18 0001      AROM   Left Shoulder Flexion  120 Degrees    Left Shoulder ABduction  70 Degrees    Left Shoulder Internal Rotation  25 Degrees    Left Shoulder External Rotation  50 Degrees                   [facility] Adult PT Treatment/Exercise - 11/01/18 0001      Shoulder Exercises: Supine   Other Supine Exercises  1# and 2# punches, then isometric circels, added ER/IR today      Shoulder Exercises: Standing   External Rotation  Left;20 reps;Theraband    Theraband Level (Shoulder External Rotation)  Level 1 (Yellow)    Internal Rotation  Left;20 reps;Theraband    Theraband Level (Shoulder Internal Rotation)  Level 2 (Red)    Extension  20 reps;Theraband    Theraband Level (Shoulder Extension)  Level 2 (Red)       Shoulder Exercises: ROM/Strengthening   UBE (Upper Arm Bike)  Level 4 x 6 minuts Fwd and bkwd    Lat Pull  1.5 plate;20 reps    Cybex Row  1 plate;20 reps    Wall Wash  flexion, circles and scaption    "W" Arms  20      Electrical Stimulation   Electrical Stimulation Location  left lateral shoulder    Electrical Stimulation Action  IFC    Electrical Stimulation Parameters  sitting    Electrical Stimulation Goals  Pain      Manual Therapy   Manual Therapy  Passive ROM;Soft tissue mobilization    Passive ROM  PROM all GH joint motions, gentle contract relax               PT Short Term Goals - 10/11/18 1138      PT SHORT TERM GOAL #1   Title  independent with initial HEP    Status  Achieved        PT Long Term Goals - 11/01/18 1144  PT LONG TERM GOAL #1   Title  increase AROM of left shoulder flexion to 130 degrees    Status  Partially Met      PT LONG TERM GOAL #2   Title  increase ER of the leftshoulder to 70 degrees    Status  Partially Met      PT LONG TERM GOAL #4   Title  dress and do hair without difficulty    Status  Partially Met            Plan - 11/01/18 1142    Clinical Impression Statement  Patient has weakness in the abduction and scaption plane, she has gained a lot of AROM over the past month.  We are starting to work on strength    PT Next Visit Plan  week 12 and working slowly due to the MD wanting to start PT late due to the multi tendon tears    Consulted and Agree with Plan of Care  Patient       Patient will benefit from skilled therapeutic intervention in order to improve the following deficits and impairments:  Decreased range of motion, Impaired UE functional use, Increased muscle spasms, Decreased activity tolerance, Pain, Improper body mechanics, Decreased strength, Increased edema, Postural dysfunction  Visit Diagnosis: Acute pain of left shoulder  Stiffness of left shoulder, not elsewhere classified  Cramp and  spasm  Localized edema     Problem List There are no active problems to display for this patient.   Sumner Boast., PT 11/01/2018, 11:45 AM  [place], [place], [address] Phone: [phone]   Fax:  [phone]  Name: Kennya Schwenn MRN: [id number] Date of Birth: 1941/09/07

## 2018-11-05 ENCOUNTER — Encounter: Payer: Self-pay | Admitting: Physical Therapy

## 2018-11-05 ENCOUNTER — Ambulatory Visit: Payer: Medicare Other | Admitting: Physical Therapy

## 2018-11-05 DIAGNOSIS — M25512 Pain in left shoulder: Secondary | ICD-10-CM | POA: Diagnosis not present

## 2018-11-05 DIAGNOSIS — R252 Cramp and spasm: Secondary | ICD-10-CM

## 2018-11-05 DIAGNOSIS — M25612 Stiffness of left shoulder, not elsewhere classified: Secondary | ICD-10-CM

## 2018-11-05 NOTE — Therapy (Signed)
[place] [address] [place], [place], [address] Phone: [phone]   Fax:  [phone]  Physical Therapy Treatment  Patient Details  Name: Walda Hertzog MRN: [id number] Date of Birth: [birth year] Referring Provider (PT): Davina Poke   Encounter Date: 11/05/2018  PT End of Session - 11/05/18 1055    Visit Number  11    Date for PT Re-Evaluation  11/22/18    PT Start Time  1007    PT Stop Time  1105    PT Time Calculation (min)  58 min    Activity Tolerance  Patient tolerated treatment well;Patient limited by pain    Behavior During Therapy  [facility] for tasks assessed/performed;Anxious       Past Medical History:  Diagnosis Date  . Depression   . Hyperlipemia   . Osteoporosis     Past Surgical History:  Procedure Laterality Date  . ABDOMINAL HYSTERECTOMY    . BREAST SURGERY      There were no vitals filed for this visit.  Subjective Assessment - 11/05/18 1008    Subjective  Patient reports that she woke up Saturday very sore and reports that she has remained sore    Currently in Pain?  Yes    Pain Score  6     Pain Location  Shoulder    Pain Orientation  Left    Pain Descriptors / Indicators  Sore    Aggravating Factors   may be we overdid it on Friday                       [facility] Adult PT Treatment/Exercise - 11/05/18 0001      Shoulder Exercises: Supine   Other Supine Exercises  1# and 2# punches, then isometric circels, added ER/IR today      Shoulder Exercises: Standing   External Rotation  Left;20 reps;Theraband    Theraband Level (Shoulder External Rotation)  Level 1 (Yellow)    Internal Rotation  Left;20 reps;Theraband    Theraband Level (Shoulder Internal Rotation)  Level 2 (Red)    Extension  20 reps;Theraband    Theraband Level (Shoulder Extension)  Level 2 (Red)    Row  Both;20 reps;Theraband    Theraband Level (Shoulder Row)  Level 2 (Red)      Shoulder Exercises:  ROM/Strengthening   UBE (Upper Arm Bike)  Level 2 x 6 minuts Fwd and bkwd    Wall Wash  flexion, circles and scaption    "W" Arms  20 with PT overpressure      Electrical Stimulation   Electrical Stimulation Location  left lateral shoulder    Electrical Stimulation Action  IFC    Electrical Stimulation Parameters  sitting    Electrical Stimulation Goals  Pain      Manual Therapy   Manual Therapy  Passive ROM;Soft tissue mobilization    Soft tissue mobilization  to the left upper arm and into the shoulder    Passive ROM  PROM all GH joint motions               PT Short Term Goals - 10/11/18 1138      PT SHORT TERM GOAL #1   Title  independent with initial HEP    Status  Achieved        PT Long Term Goals - 11/01/18 1144      PT LONG TERM GOAL #1   Title  increase AROM of  left shoulder flexion to 130 degrees    Status  Partially Met      PT LONG TERM GOAL #2   Title  increase ER of the leftshoulder to 70 degrees    Status  Partially Met      PT LONG TERM GOAL #4   Title  dress and do hair without difficulty    Status  Partially Met            Plan - 11/05/18 1056    Clinical Impression Statement  Patient with increased c/o pain today in the left upper arm anterior and latera and the posterior shoulder, she thinks it is from [place] stretching last Friday.  She still had good ROM but it is stiff and the first few attempts are difficult.    PT Next Visit Plan  tried to see if we can get her pain down to go again at her functional reach and strength    Consulted and Agree with Plan of Care  Patient       Patient will benefit from skilled therapeutic intervention in order to improve the following deficits and impairments:  Decreased range of motion, Impaired UE functional use, Increased muscle spasms, Decreased activity tolerance, Pain, Improper body mechanics, Decreased strength, Increased edema, Postural dysfunction  Visit Diagnosis: Acute pain of left  shoulder  Stiffness of left shoulder, not elsewhere classified  Cramp and spasm     Problem List There are no active problems to display for this patient.   Sumner Boast., PT 11/05/2018, 10:58 AM  [place], [place], [address] Phone: [phone]   Fax:  [phone]  Name: Tinna Kolker MRN: [id number] Date of Birth: [birth year]

## 2018-11-08 ENCOUNTER — Encounter: Payer: Self-pay | Admitting: Physical Therapy

## 2018-11-08 ENCOUNTER — Ambulatory Visit: Payer: Medicare Other | Admitting: Physical Therapy

## 2018-11-08 DIAGNOSIS — R600 Localized edema: Secondary | ICD-10-CM

## 2018-11-08 DIAGNOSIS — M25512 Pain in left shoulder: Secondary | ICD-10-CM | POA: Diagnosis not present

## 2018-11-08 DIAGNOSIS — M25612 Stiffness of left shoulder, not elsewhere classified: Secondary | ICD-10-CM

## 2018-11-08 DIAGNOSIS — R252 Cramp and spasm: Secondary | ICD-10-CM

## 2018-11-08 NOTE — Therapy (Signed)
[place] [address] [place], [place], [address] Phone: [phone]   Fax:  [phone]  Physical Therapy Treatment  Patient Details  Name: Kim Grant MRN: [id number] Date of Birth: 1941/04/11 Referring Provider (PT): Davina Poke   Encounter Date: 11/08/2018  PT End of Session - 11/08/18 1138    Visit Number  12    Date for PT Re-Evaluation  11/22/18    PT Start Time  1048    PT Stop Time  1149    PT Time Calculation (min)  61 min    Activity Tolerance  Patient tolerated treatment well    Behavior During Therapy  [facility] for tasks assessed/performed;Anxious       Past Medical History:  Diagnosis Date  . Depression   . Hyperlipemia   . Osteoporosis     Past Surgical History:  Procedure Laterality Date  . ABDOMINAL HYSTERECTOMY    . BREAST SURGERY      There were no vitals filed for this visit.  Subjective Assessment - 11/08/18 1133    Subjective  Patient reports that she felt much better after the treatment.  She is still sore in the upper trap and in the rhomboid    Currently in Pain?  Yes    Pain Score  4     Pain Location  Shoulder    Pain Orientation  Left                       [facility] Adult PT Treatment/Exercise - 11/08/18 0001      Shoulder Exercises: Standing   External Rotation  Left;20 reps;Theraband    Theraband Level (Shoulder External Rotation)  Level 2 (Red)    Internal Rotation  Left;20 reps;Theraband    Theraband Level (Shoulder Internal Rotation)  Level 2 (Red)    Other Standing Exercises  worked on eccentrics  for flexion to help get her ROM  in the end range    Other Standing Exercises  1 and 2# cabinet reaching different shelves, needs cues and assist to keep elbow in and decrease compensation      Shoulder Exercises: ROM/Strengthening   UBE (Upper Arm Bike)  Level 4 x 6 minuts Fwd and bkwd    Lat Pull  1.5 plate;20 reps    Cybex Press  1 plate;20 reps    Cybex Press  Limitations  needing assist    Cybex Row  1.5 plate;20 reps    Cybex Row Limitations  cues to decrease shoulder elevation and get retraction    Wall Wash  flexion, circles and scaption and overhead side to side, used 1#    "W" Arms  20 with PT overpressure      Electrical Stimulation   Electrical Stimulation Location  left lateral shoulder    Electrical Stimulation Action  IFC    Electrical Stimulation Parameters  sitting    Electrical Stimulation Goals  Pain      Manual Therapy   Manual Therapy  Passive ROM;Soft tissue mobilization    Soft tissue mobilization  to the left upper arm and into the shoulder included upper trap and the rhomboid    Passive ROM  PROM all GH joint motions               PT Short Term Goals - 10/11/18 1138      PT SHORT TERM GOAL #1   Title  independent with initial HEP    Status  Achieved        PT Long Term Goals - 11/08/18 1140      PT LONG TERM GOAL #1   Title  increase AROM of left shoulder flexion to 130 degrees    Status  Partially Met      PT LONG TERM GOAL #2   Title  increase ER of the leftshoulder to 70 degrees    Status  Partially Met      PT LONG TERM GOAL #3   Title  decrease pain 50%    Status  Partially Met      PT LONG TERM GOAL #4   Title  dress and do hair without difficulty    Status  Partially Met      PT LONG TERM GOAL #5   Title  lift 3# into head high cabinet    Status  Partially Met            Plan - 11/08/18 1138    Clinical Impression Statement  Patient reports less pain today she has knots and tenderness in the left upper trap, the rhomboid and the upper posterior arm.  With cabinet reaching she really starts to compensate with the shoulder elevation and takes the arm out to the side, needing a lot of cues and some assist, started working on eccnetric in flexion to help with the functional reach    PT Next Visit Plan  work on [employer] and Agree with Plan of Care  Patient        Patient will benefit from skilled therapeutic intervention in order to improve the following deficits and impairments:  Decreased range of motion, Impaired UE functional use, Increased muscle spasms, Decreased activity tolerance, Pain, Improper body mechanics, Decreased strength, Increased edema, Postural dysfunction  Visit Diagnosis: Acute pain of left shoulder  Stiffness of left shoulder, not elsewhere classified  Cramp and spasm  Localized edema     Problem List There are no active problems to display for this patient.   Sumner Boast., PT 11/08/2018, 11:41 AM  [place], [place], [address] Phone: [phone]   Fax:  [phone]  Name: Kim Grant MRN: [id number] Date of Birth: [birth year]

## 2018-11-12 ENCOUNTER — Encounter: Payer: Self-pay | Admitting: Physical Therapy

## 2018-11-12 ENCOUNTER — Ambulatory Visit: Payer: Medicare Other | Admitting: Physical Therapy

## 2018-11-12 DIAGNOSIS — M25612 Stiffness of left shoulder, not elsewhere classified: Secondary | ICD-10-CM

## 2018-11-12 DIAGNOSIS — M25512 Pain in left shoulder: Secondary | ICD-10-CM | POA: Diagnosis not present

## 2018-11-12 DIAGNOSIS — R252 Cramp and spasm: Secondary | ICD-10-CM

## 2018-11-12 NOTE — Therapy (Signed)
[place], [place], [address] Phone: [phone]   Fax:  [phone]  Physical Therapy Treatment  Patient Details  Name: Kim Grant MRN: [id number] Date of Birth: [birth year] Referring Provider (PT): Davina Poke   Encounter Date: 11/12/2018  PT End of Session - 11/12/18 0927    Visit Number  13    Date for PT Re-Evaluation  11/22/18    PT Start Time  0842    PT Stop Time  0941    PT Time Calculation (min)  59 min    Activity Tolerance  Patient tolerated treatment well    Behavior During Therapy  [facility] for tasks assessed/performed;Anxious       Past Medical History:  Diagnosis Date  . Depression   . Hyperlipemia   . Osteoporosis     Past Surgical History:  Procedure Laterality Date  . ABDOMINAL HYSTERECTOMY    . BREAST SURGERY      There were no vitals filed for this visit.  Subjective Assessment - 11/12/18 0839    Subjective  Patient continues to have reports of weakness with functional reach.  She reports that she is doing more functional activities    Currently in Pain?  Yes    Pain Score  3     Pain Location  Shoulder    Pain Orientation  Left    Aggravating Factors   activity, reaching                       [facility] Adult PT Treatment/Exercise - 11/12/18 0001      Shoulder Exercises: Supine   Other Supine Exercises  1# and 2# punches, then isometric circels, ER/IR  and serratus punches      Shoulder Exercises: Sidelying   External Rotation  Left;20 reps;Weights    External Rotation Weight (lbs)  1#    External Rotation Limitations  patient needs cues to decrease the compensation    ABduction  20 reps;Left    ABduction Weight (lbs)  1#    ABduction Limitations  PT decreasing the ROM      Shoulder Exercises: Standing   External Rotation  Left;20 reps;Theraband    Theraband Level (Shoulder External Rotation)  Level 2 (Red)    External Rotation Limitations  some  cues for scapular retraction    Internal Rotation  Left;20 reps;Theraband    Theraband Level (Shoulder Internal Rotation)  Level 2 (Red)      Shoulder Exercises: ROM/Strengthening   UBE (Upper Arm Bike)  Level 4 x 6 minuts Fwd and bkwd    Lat Pull  1.5 plate;20 reps    Cybex Press  1 plate;20 reps    Cybex Press Limitations  needs assist to initiate    Cybex Row  1.5 plate;20 reps    Cybex Row Limitations  cues to decrease shoulder elevation and get retraction    Wall Wash  flexion, circles and scaption and overhead side to side, used 1#    Other ROM/Strengthening Exercises  working on eccentrics flexion      Acupuncturist Location  left lateral shoulder    Electrical Stimulation Action  IFC    Electrical Stimulation Parameters  sitting    Electrical Stimulation Goals  Pain      Manual Therapy   Manual Therapy  Passive ROM;Soft tissue mobilization    Passive ROM  all GH joint motions, to end range, some  gentle contract relax               PT Short Term Goals - 10/11/18 1138      PT SHORT TERM GOAL #1   Title  independent with initial HEP    Status  Achieved        PT Long Term Goals - 11/08/18 1140      PT LONG TERM GOAL #1   Title  increase AROM of left shoulder flexion to 130 degrees    Status  Partially Met      PT LONG TERM GOAL #2   Title  increase ER of the leftshoulder to 70 degrees    Status  Partially Met      PT LONG TERM GOAL #3   Title  decrease pain 50%    Status  Partially Met      PT LONG TERM GOAL #4   Title  dress and do hair without difficulty    Status  Partially Met      PT LONG TERM GOAL #5   Title  lift 3# into head high cabinet    Status  Partially Met            Plan - 11/12/18 0927    Clinical Impression Statement  Patient tends to make the motions much more difficult than they should be, she compensates with many mms and really causes the motions to be poor and difficult, she needs a  lot of cues for better motions, to set the scapula and relax as she tends to fight herself    PT Next Visit Plan  work on [employer] and Agree with Plan of Care  Patient       Patient will benefit from skilled therapeutic intervention in order to improve the following deficits and impairments:  Decreased range of motion, Impaired UE functional use, Increased muscle spasms, Decreased activity tolerance, Pain, Improper body mechanics, Decreased strength, Increased edema, Postural dysfunction  Visit Diagnosis: Acute pain of left shoulder  Stiffness of left shoulder, not elsewhere classified  Cramp and spasm     Problem List There are no active problems to display for this patient.   Sumner Boast., PT 11/12/2018, 9:29 AM  [place], [place], [address] Phone: [phone]   Fax:  [phone]  Name: Kim Grant MRN: [id number] Date of Birth: 1941-06-10

## 2018-11-14 ENCOUNTER — Ambulatory Visit: Payer: Medicare Other | Admitting: Physical Therapy

## 2018-11-14 ENCOUNTER — Encounter: Payer: Self-pay | Admitting: Physical Therapy

## 2018-11-14 DIAGNOSIS — M25512 Pain in left shoulder: Secondary | ICD-10-CM

## 2018-11-14 DIAGNOSIS — R252 Cramp and spasm: Secondary | ICD-10-CM

## 2018-11-14 DIAGNOSIS — M25612 Stiffness of left shoulder, not elsewhere classified: Secondary | ICD-10-CM

## 2018-11-14 NOTE — Therapy (Signed)
[place], [place], [address] Phone: [phone]   Fax:  [phone]  Physical Therapy Treatment  Patient Details  Name: Kim Grant MRN: [id number] Date of Birth: 1941-02-24 Referring Provider (PT): Davina Poke   Encounter Date: 11/14/2018  PT End of Session - 11/14/18 1529    Visit Number  14    Date for PT Re-Evaluation  11/22/18    PT Start Time  1430    PT Stop Time  1534    PT Time Calculation (min)  64 min    Activity Tolerance  Patient tolerated treatment well    Behavior During Therapy  [facility] for tasks assessed/performed;Anxious       Past Medical History:  Diagnosis Date  . Depression   . Hyperlipemia   . Osteoporosis     Past Surgical History:  Procedure Laterality Date  . ABDOMINAL HYSTERECTOMY    . BREAST SURGERY      There were no vitals filed for this visit.  Subjective Assessment - 11/14/18 1504    Subjective  I felt like I was moving better, I washed my hair to day without pain    Currently in Pain?  Yes    Pain Score  3     Pain Location  Shoulder    Pain Orientation  Left         [facility] PT Assessment - 11/14/18 0001      AROM   Left Shoulder Flexion  123 Degrees    Left Shoulder ABduction  80 Degrees    Left Shoulder Internal Rotation  40 Degrees    Left Shoulder External Rotation  65 Degrees                   [facility] Adult PT Treatment/Exercise - 11/14/18 0001      Shoulder Exercises: Standing   External Rotation  Left;20 reps;Theraband    Theraband Level (Shoulder External Rotation)  Level 2 (Red)    Internal Rotation  Left;20 reps;Theraband    Theraband Level (Shoulder Internal Rotation)  Level 2 (Red)    Other Standing Exercises  worked on eccentrics  for flexion to help get her ROM  in the end range, some rhythmic stabilizaiton with weighted ball facing wall and away from the ball    Other Standing Exercises  1 and 2# cabinet reaching  different shelves, needs cues and assist to keep elbow in and decrease compensation      Shoulder Exercises: ROM/Strengthening   UBE (Upper Arm Bike)  Level 4 x 6 minuts Fwd and bkwd    Lat Pull  2 plate;20 reps    Cybex Press  1 plate;20 reps    Cybex Row  2 plate;20 reps    Wall Wash  flexion, circles and scaption and overhead side to side, used 1#    "W" Arms  20 with PT overpressure      Electrical Stimulation   Electrical Stimulation Location  left lateral shoulder    Electrical Stimulation Action  IFC    Electrical Stimulation Parameters  sitting    Electrical Stimulation Goals  Pain      Manual Therapy   Manual Therapy  Passive ROM;Soft tissue mobilization    Passive ROM  all GH joint motions, to end range, some gentle contract relax, joint mobs all mobs of GH               PT Short Term Goals -  10/11/18 1138      PT SHORT TERM GOAL #1   Title  independent with initial HEP    Status  Achieved        PT Long Term Goals - 11/14/18 1531      PT LONG TERM GOAL #1   Title  increase AROM of left shoulder flexion to 130 degrees    Status  Partially Met      PT LONG TERM GOAL #2   Title  increase ER of the leftshoulder to 70 degrees    Status  Partially Met            Plan - 11/14/18 1529    Clinical Impression Statement  Patient continue sto resist some needing cues to relax and allow motions, she gained ROM on the ER/IR about 10 degrees each, mild increase in Flexion.  Cues are needed to remember HEP even with pictures and handout    PT Next Visit Plan  continue to work on function and the ROM    Consulted and Agree with Plan of Care  Patient       Patient will benefit from skilled therapeutic intervention in order to improve the following deficits and impairments:  Decreased range of motion, Impaired UE functional use, Increased muscle spasms, Decreased activity tolerance, Pain, Improper body mechanics, Decreased strength, Increased edema, Postural  dysfunction  Visit Diagnosis: Acute pain of left shoulder  Stiffness of left shoulder, not elsewhere classified  Cramp and spasm     Problem List There are no active problems to display for this patient.   Sumner Boast., PT 11/14/2018, 3:32 PM  [place], [place], [address] Phone: [phone]   Fax:  [phone]  Name: Kim Grant MRN: [id number] Date of Birth: 1941-09-09

## 2018-11-18 ENCOUNTER — Encounter: Payer: Medicare Other | Admitting: Physical Therapy

## 2018-11-22 ENCOUNTER — Ambulatory Visit: Payer: Medicare Other | Admitting: Physical Therapy

## 2018-11-22 ENCOUNTER — Encounter: Payer: Self-pay | Admitting: Physical Therapy

## 2018-11-22 DIAGNOSIS — M25612 Stiffness of left shoulder, not elsewhere classified: Secondary | ICD-10-CM

## 2018-11-22 DIAGNOSIS — R252 Cramp and spasm: Secondary | ICD-10-CM

## 2018-11-22 DIAGNOSIS — M25512 Pain in left shoulder: Secondary | ICD-10-CM | POA: Diagnosis not present

## 2018-11-22 NOTE — Therapy (Signed)
[place] [address] [place], [place], [address] Phone: [phone]   Fax:  [phone]  Physical Therapy Treatment  Patient Details  Name: Kim Grant MRN: [id number] Date of Birth: 1940-12-04 Referring Provider (PT): Davina Poke   Encounter Date: 11/22/2018  PT End of Session - 11/22/18 1152    Visit Number  15    Date for PT Re-Evaluation  12/22/18    PT Start Time  1054    PT Stop Time  1157    PT Time Calculation (min)  63 min    Activity Tolerance  Patient tolerated treatment well    Behavior During Therapy  [facility] for tasks assessed/performed;Anxious       Past Medical History:  Diagnosis Date  . Depression   . Hyperlipemia   . Osteoporosis     Past Surgical History:  Procedure Laterality Date  . ABDOMINAL HYSTERECTOMY    . BREAST SURGERY      There were no vitals filed for this visit.  Subjective Assessment - 11/22/18 1113    Subjective  Patient reports some increased pain, reports that she did not get to come in earlier this week due to a gas leak    Currently in Pain?  Yes    Pain Score  5     Pain Location  Shoulder    Pain Orientation  Left;Posterior    Pain Descriptors / Indicators  Dull;Aching    Aggravating Factors   unsure of why incresed pain today         [facility] PT Assessment - 11/22/18 0001      Assessment   Medical Diagnosis  s/p left RC repair and biceps repair    Referring Provider (PT)  Davina Poke    Onset Date/Surgical Date  08/14/18                   [facility] Adult PT Treatment/Exercise - 11/22/18 0001      Shoulder Exercises: Supine   Other Supine Exercises  2# and 3# punches with isometric circles      Shoulder Exercises: Seated   External Rotation  Left;20 reps;Weights    External Rotation Weight (lbs)  2-3#    External Rotation Limitations  with the elbow up on physioball      Shoulder Exercises: Standing   External Rotation  Left;20 reps;Theraband    Theraband Level (Shoulder External Rotation)  Level 1 (Yellow);Level 2 (Red)    Internal Rotation  Left;20 reps;Theraband    Theraband Level (Shoulder Internal Rotation)  Level 3 (Green)    Other Standing Exercises  worked on eccentrics  for flexion to help get her ROM  in the end range, some rhythmic stabilizaiton with weighted ball facing wall and away from the ball with 1# slide up wall and step away lowering arm with elbow in    Other Standing Exercises  1, 2 and 3# cabinet reaching different shelves, needs cues and assist to keep elbow in and decrease compensation      Shoulder Exercises: ROM/Strengthening   UBE (Upper Arm Bike)  Level 4 x 6 minuts Fwd and bkwd    Lat Pull  2 plate;20 reps    Cybex Row  2 plate;20 reps    "W" Arms  20 with PT overpressure      Electrical Stimulation   Electrical Stimulation Location  left lateral shoulder    Electrical Stimulation Action  IFC    Electrical Stimulation Parameters  sitting    Electrical Stimulation Goals  Pain      Manual Therapy   Manual Therapy  Passive ROM;Soft tissue mobilization    Passive ROM  all GH joint motions, to end range, some gentle contract relax, joint mobs all mobs of GH               PT Short Term Goals - 10/11/18 1138      PT SHORT TERM GOAL #1   Title  independent with initial HEP    Status  Achieved        PT Long Term Goals - 11/22/18 1157      PT LONG TERM GOAL #1   Title  increase AROM of left shoulder flexion to 130 degrees    Status  Partially Met      PT LONG TERM GOAL #2   Title  increase ER of the leftshoulder to 70 degrees    Status  Partially Met      PT LONG TERM GOAL #4   Title  dress and do hair without difficulty    Status  Partially Met            Plan - 11/22/18 1153    Clinical Impression Statement  Patient really continues to need cues to relax.  She teneds to really resist pain and once she starts gets fatigued she will also really have difficulty and start  compensting needing a lot of cues to decrease this.  Overall the ROM is improving but she does get tight and it is slow progress    PT Next Visit Plan  continue to work on function and the ROM    Consulted and Agree with Plan of Care  Patient       Patient will benefit from skilled therapeutic intervention in order to improve the following deficits and impairments:  Decreased range of motion, Impaired UE functional use, Increased muscle spasms, Decreased activity tolerance, Pain, Improper body mechanics, Decreased strength, Increased edema, Postural dysfunction  Visit Diagnosis: Acute pain of left shoulder - Plan: PT plan of care cert/re-cert  Stiffness of left shoulder, not elsewhere classified - Plan: PT plan of care cert/re-cert  Cramp and spasm - Plan: PT plan of care cert/re-cert     Problem List There are no active problems to display for this patient.   Sumner Boast., PT 11/22/2018, 11:59 AM  [place], [place], [address] Phone: [phone]   Fax:  [phone]  Name: Kim Grant MRN: [id number] Date of Birth: Mar 02, 1941

## 2018-11-25 DIAGNOSIS — K2270 Barrett's esophagus without dysplasia: Secondary | ICD-10-CM | POA: Insufficient documentation

## 2018-11-26 ENCOUNTER — Ambulatory Visit: Payer: Medicare Other | Attending: Sports Medicine | Admitting: Physical Therapy

## 2018-11-26 ENCOUNTER — Encounter: Payer: Self-pay | Admitting: Physical Therapy

## 2018-11-26 DIAGNOSIS — M25612 Stiffness of left shoulder, not elsewhere classified: Secondary | ICD-10-CM

## 2018-11-26 DIAGNOSIS — R252 Cramp and spasm: Secondary | ICD-10-CM | POA: Insufficient documentation

## 2018-11-26 DIAGNOSIS — M25512 Pain in left shoulder: Secondary | ICD-10-CM | POA: Insufficient documentation

## 2018-11-26 NOTE — Therapy (Signed)
[place] [address] [place], [place], [address] Phone: [phone]   Fax:  [phone]  Physical Therapy Treatment  Patient Details  Name: Kim Grant MRN: [id number] Date of Birth: 1941-09-04 Referring Provider (PT): Davina Poke   Encounter Date: 11/26/2018  PT End of Session - 11/26/18 1138    Visit Number  16    Date for PT Re-Evaluation  12/22/18    PT Start Time  1050    PT Stop Time  1148    PT Time Calculation (min)  58 min    Activity Tolerance  Patient tolerated treatment well    Behavior During Therapy  [facility] for tasks assessed/performed;Anxious       Past Medical History:  Diagnosis Date  . Depression   . Hyperlipemia   . Osteoporosis     Past Surgical History:  Procedure Laterality Date  . ABDOMINAL HYSTERECTOMY    . BREAST SURGERY      There were no vitals filed for this visit.  Subjective Assessment - 11/26/18 1058    Subjective  Patient reports easy to turn the faucet today, less pain but still weak and fatigues with ADL's and home chores    Currently in Pain?  Yes    Pain Score  3     Pain Location  Shoulder    Pain Orientation  Left    Aggravating Factors   REports overhead use really fatigues and causes pain                       [facility] Adult PT Treatment/Exercise - 11/26/18 0001      Shoulder Exercises: Sidelying   External Rotation  Left;20 reps;Weights    External Rotation Weight (lbs)  2#    External Rotation Limitations  patient needs cues to decrease the compensation    ABduction  20 reps;Left    ABduction Weight (lbs)  1#      Shoulder Exercises: Standing   Other Standing Exercises  ball throwing underhand, dart, overhead small weighted and no weight ball, wall circles and flexion with 2#, then worked on eccentrics    Other Standing Exercises  2# cabinet reaching to head high cabinet sets of 5 this decreased the fatigue, then overhead cabinet with no weight and  1# , did some eccentrics from the top shelf with 1#      Shoulder Exercises: ROM/Strengthening   UBE (Upper Arm Bike)  constant work 20 watts x 6 minutes, needed help to keep going and get back on top of the motion to keep the watts    Lat Pull  2 plate;20 reps    Cybex Row  2 plate;20 reps    Other ROM/Strengthening Exercises  5# straight arm pulls, left only 5# chest press, 1# reaching to belt line at mid back with some PT assist, ball vs wall single arm circles      Electrical Stimulation   Electrical Stimulation Location  left lateral shoulder    Electrical Stimulation Action  IFC    Electrical Stimulation Parameters  sitting    Electrical Stimulation Goals  Pain               PT Short Term Goals - 10/11/18 1138      PT SHORT TERM GOAL #1   Title  independent with initial HEP    Status  Achieved        PT Long Term Goals - 11/22/18  [place] #1   Title  increase AROM of left shoulder flexion to 130 degrees    Status  Partially Met      PT LONG TERM GOAL #2   Title  increase ER of the leftshoulder to 70 degrees    Status  Partially Met      PT LONG TERM GOAL #4   Title  dress and do hair without difficulty    Status  Partially Met            Plan - 11/26/18 1138    Clinical Impression Statement  Patient doing better, able to reach head high shelf with 2# sets of 5 before fatigue, then able to reach an overhead shelf with no weight and with 1#.  She did struggle with the first few reps of reaching almost as she was unsure of how to do, then once she figured it out she did well.    PT Next Visit Plan  work on IR andfunctional strength    Consulted and Agree with Plan of Care  Patient       Patient will benefit from skilled therapeutic intervention in order to improve the following deficits and impairments:  Decreased range of motion, Impaired UE functional use, Increased muscle spasms, Decreased activity tolerance, Pain, Improper body  mechanics, Decreased strength, Increased edema, Postural dysfunction  Visit Diagnosis: Acute pain of left shoulder  Stiffness of left shoulder, not elsewhere classified  Cramp and spasm     Problem List There are no active problems to display for this patient.   Kim Grant., PT 11/26/2018, 11:44 AM  [address] [place], [place], [address] Phone: [phone]   Fax:  [phone]  Name: Kim Grant MRN: [id number] Date of Birth: Sep 23, 1941

## 2018-11-29 ENCOUNTER — Ambulatory Visit: Payer: Medicare Other | Admitting: Physical Therapy

## 2018-11-29 ENCOUNTER — Encounter: Payer: Self-pay | Admitting: Physical Therapy

## 2018-11-29 DIAGNOSIS — M25512 Pain in left shoulder: Secondary | ICD-10-CM | POA: Diagnosis not present

## 2018-11-29 DIAGNOSIS — R252 Cramp and spasm: Secondary | ICD-10-CM

## 2018-11-29 DIAGNOSIS — M25612 Stiffness of left shoulder, not elsewhere classified: Secondary | ICD-10-CM

## 2018-11-29 NOTE — Therapy (Signed)
[place] [address] [place], [place], [address] Phone: [phone]   Fax:  [phone]  Physical Therapy Treatment  Patient Details  Name: Kim Grant MRN: [id number] Date of Birth: April 29, 1941 Referring Provider (PT): Davina Poke   Encounter Date: 11/29/2018  PT End of Session - 11/29/18 1142    Visit Number  17    Date for PT Re-Evaluation  12/22/18    PT Start Time  9163    PT Stop Time  1135    PT Time Calculation (min)  50 min    Activity Tolerance  Patient tolerated treatment well    Behavior During Therapy  [facility] for tasks assessed/performed;Anxious       Past Medical History:  Diagnosis Date  . Depression   . Hyperlipemia   . Osteoporosis     Past Surgical History:  Procedure Laterality Date  . ABDOMINAL HYSTERECTOMY    . BREAST SURGERY      There were no vitals filed for this visit.  Subjective Assessment - 11/29/18 1056    Subjective  Patient reports very tigred and sore, she reports that she got a new fridge yesterday and she had to take everything out of the old one store it and then put in the new one    Currently in Pain?  Yes    Pain Score  4     Pain Location  Shoulder    Pain Orientation  Left    Aggravating Factors   cleaning out fridge set me back some         [facility] PT Assessment - 11/29/18 0001      AROM   Left Shoulder Flexion  125 Degrees    Left Shoulder ABduction  100 Degrees    Left Shoulder Internal Rotation  45 Degrees    Left Shoulder External Rotation  66 Degrees                   [facility] Adult PT Treatment/Exercise - 11/29/18 0001      Shoulder Exercises: Supine   Other Supine Exercises  2# and 3# punches with isometric circles    Other Supine Exercises  2# ER/IR with arm at 90 degrees abduction      Shoulder Exercises: Sidelying   ABduction  20 reps;Left    ABduction Weight (lbs)  1#    ABduction Limitations  PT decreasing the ROM to decrease pain and  popping      Shoulder Exercises: Standing   Other Standing Exercises  ball throwing underhand, dart, overhead small weighted and no weight ball, wall circles and flexion with 2#, then worked on Nurse, mental health    Other Standing Exercises  2# cabinet reaching to head high cabinet sets of 5 this decreased the fatigue, then overhead cabinet with no weight and 1# , did some eccentrics from the top shelf with 1#      Shoulder Exercises: ROM/Strengthening   UBE (Upper Arm Bike)  constant work 20 watts x 6 minutes, needed help to keep going and get back on top of the motion to keep the watts    Lat Pull  2 plate;20 reps    Cybex Press Limitations  no weight 2 x 10    Cybex Row  2 plate;20 reps      Electrical Stimulation   Electrical Stimulation Location  left lateral shoulder    Electrical Stimulation Action  IFC    Electrical Stimulation Parameters  sitting  Electrical Stimulation Goals  Pain      Manual Therapy   Manual Therapy  Passive ROM;Soft tissue mobilization    Passive ROM  focus on ER/IR               PT Short Term Goals - 10/11/18 1138      PT SHORT TERM GOAL #1   Title  independent with initial HEP    Status  Achieved        PT Long Term Goals - 11/29/18 1147      PT LONG TERM GOAL #1   Title  increase AROM of left shoulder flexion to 130 degrees    Status  Partially Met      PT LONG TERM GOAL #2   Title  increase ER of the leftshoulder to 70 degrees    Status  Partially Met      PT LONG TERM GOAL #4   Title  dress and do hair without difficulty    Status  Partially Met            Plan - 11/29/18 1144    Clinical Impression Statement  Patient able to reach waistband at middle of back.  She did better with reaching the overhead cabinet shelf.  She is tight in ER/IR but is improving her biggest limitation in strength now is abduction but we will slowly go with this due to the surgical protocol    PT Next Visit Plan  work on IR andfunctional strength     Consulted and Agree with Plan of Care  Patient       Patient will benefit from skilled therapeutic intervention in order to improve the following deficits and impairments:  Decreased range of motion, Impaired UE functional use, Increased muscle spasms, Decreased activity tolerance, Pain, Improper body mechanics, Decreased strength, Increased edema, Postural dysfunction  Visit Diagnosis: Acute pain of left shoulder  Stiffness of left shoulder, not elsewhere classified  Cramp and spasm     Problem List There are no active problems to display for this patient.   Sumner Boast., PT 11/29/2018, 11:48 AM  [place], [place], [address] Phone: [phone]   Fax:  [phone]  Name: Kim Grant MRN: [id number] Date of Birth: July 03, 1941

## 2018-12-03 ENCOUNTER — Encounter: Payer: Self-pay | Admitting: Physical Therapy

## 2018-12-03 ENCOUNTER — Ambulatory Visit: Payer: Medicare Other | Admitting: Physical Therapy

## 2018-12-03 DIAGNOSIS — M25512 Pain in left shoulder: Secondary | ICD-10-CM | POA: Diagnosis not present

## 2018-12-03 DIAGNOSIS — R252 Cramp and spasm: Secondary | ICD-10-CM

## 2018-12-03 DIAGNOSIS — M25612 Stiffness of left shoulder, not elsewhere classified: Secondary | ICD-10-CM

## 2018-12-03 NOTE — Therapy (Signed)
[place] [address] [place], [place], [address] Phone: [phone]   Fax:  [phone]  Physical Therapy Treatment  Patient Details  Name: Kim Grant MRN: [id number] Date of Birth: [birth year] Referring Provider (PT): Davina Poke   Encounter Date: 12/03/2018  PT End of Session - 12/03/18 1144    Visit Number  18    Date for PT Re-Evaluation  12/22/18    PT Start Time  1058    PT Stop Time  1154    PT Time Calculation (min)  56 min    Activity Tolerance  Patient tolerated treatment well    Behavior During Therapy  [facility] for tasks assessed/performed;Anxious       Past Medical History:  Diagnosis Date  . Depression   . Hyperlipemia   . Osteoporosis     Past Surgical History:  Procedure Laterality Date  . ABDOMINAL HYSTERECTOMY    . BREAST SURGERY      There were no vitals filed for this visit.  Subjective Assessment - 12/03/18 1109    Subjective  Patient reports that she went back to work and was very fatigued, some soreness    Currently in Pain?  Yes    Pain Score  4     Pain Location  Shoulder    Pain Orientation  Left    Aggravating Factors   returning to work                       [facility] Adult PT Treatment/Exercise - 12/03/18 0001      Shoulder Exercises: Supine   Other Supine Exercises  2# ER/IR with arm at 90 degrees abduction      Shoulder Exercises: Standing   Horizontal ABduction  Both;20 reps;Theraband    Theraband Level (Shoulder Horizontal ABduction)  Level 2 (Red)    Horizontal ABduction Limitations  cues to keep elbows elevated    External Rotation  Left;20 reps;Theraband    Theraband Level (Shoulder External Rotation)  Level 2 (Red)    Internal Rotation  Left;20 reps;Theraband    Theraband Level (Shoulder Internal Rotation)  Level 3 (Green)    Diagonals  Left;20 reps;Theraband    Theraband Level (Shoulder Diagonals)  Level 1 (Yellow)    Other Standing Exercises  2#  cabinet reaching, 3# behind back reaching      Shoulder Exercises: Stretch   Other Shoulder Stretches  sleeper stretch      Electrical Stimulation   Electrical Stimulation Location  left lateral shoulder    Electrical Stimulation Action  IFC    Electrical Stimulation Parameters  sitting    Electrical Stimulation Goals  Pain      Manual Therapy   Manual Therapy  Passive ROM;Soft tissue mobilization    Soft tissue mobilization  left shoulder deltoid and RC tendon area    Passive ROM  focus on ER/IR               PT Short Term Goals - 10/11/18 1138      PT SHORT TERM GOAL #1   Title  independent with initial HEP    Status  Achieved        PT Long Term Goals - 11/29/18 1147      PT LONG TERM GOAL #1   Title  increase AROM of left shoulder flexion to 130 degrees    Status  Partially Met      PT LONG TERM GOAL #2  Title  increase ER of the leftshoulder to 70 degrees    Status  Partially Met      PT LONG TERM GOAL #4   Title  dress and do hair without difficulty    Status  Partially Met            Plan - 12/03/18 1144    Clinical Impression Statement  Patient when cued to relax she can reach above the waistband in the middle of her back, without the cues she really fights herself and tenses up and can barely get to the waistband, she allowed passive ER to end range today    PT Next Visit Plan  work on IR andfunctional strength    Consulted and Agree with Plan of Care  Patient       Patient will benefit from skilled therapeutic intervention in order to improve the following deficits and impairments:  Decreased range of motion, Impaired UE functional use, Increased muscle spasms, Decreased activity tolerance, Pain, Improper body mechanics, Decreased strength, Increased edema, Postural dysfunction  Visit Diagnosis: Acute pain of left shoulder  Stiffness of left shoulder, not elsewhere classified  Cramp and spasm     Problem List There are no active  problems to display for this patient.   Sumner Boast., PT 12/03/2018, 11:46 AM  [place], [place], [address] Phone: [phone]   Fax:  [phone]  Name: Kim Grant MRN: [id number] Date of Birth: [birth year]

## 2018-12-06 ENCOUNTER — Ambulatory Visit: Payer: Medicare Other | Admitting: Physical Therapy

## 2018-12-06 ENCOUNTER — Encounter: Payer: Self-pay | Admitting: Physical Therapy

## 2018-12-06 DIAGNOSIS — M25512 Pain in left shoulder: Secondary | ICD-10-CM | POA: Diagnosis not present

## 2018-12-06 DIAGNOSIS — R252 Cramp and spasm: Secondary | ICD-10-CM

## 2018-12-06 DIAGNOSIS — M25612 Stiffness of left shoulder, not elsewhere classified: Secondary | ICD-10-CM

## 2018-12-06 NOTE — Therapy (Signed)
[place] [address] [place], [place], [address] Phone: [phone]   Fax:  [phone]  Physical Therapy Treatment  Patient Details  Name: Kim Grant MRN: [id number] Date of Birth: July 13, 1941 Referring Provider (PT): Davina Poke   Encounter Date: 12/06/2018  PT End of Session - 12/06/18 1158    Visit Number  19    Date for PT Re-Evaluation  12/22/18    PT Start Time  1055    PT Stop Time  1152    PT Time Calculation (min)  57 min    Activity Tolerance  Patient tolerated treatment well    Behavior During Therapy  [facility] for tasks assessed/performed;Anxious       Past Medical History:  Diagnosis Date  . Depression   . Hyperlipemia   . Osteoporosis     Past Surgical History:  Procedure Laterality Date  . ABDOMINAL HYSTERECTOMY    . BREAST SURGERY      There were no vitals filed for this visit.  Subjective Assessment - 12/06/18 1102    Subjective  Patient reports feeling better this week, less pain, less difficulty with hair    Currently in Pain?  Yes    Pain Score  3     Pain Location  Shoulder    Pain Orientation  Left         [facility] PT Assessment - 12/06/18 0001      AROM   Left Shoulder Flexion  130 Degrees    Left Shoulder ABduction  95 Degrees    Left Shoulder Internal Rotation  50 Degrees    Left Shoulder External Rotation  75 Degrees                   [facility] Adult PT Treatment/Exercise - 12/06/18 0001      Shoulder Exercises: Seated   Other Seated Exercises  bent over row 5#, bent over extension 3#, 1# bent over reverese flies      Shoulder Exercises: Standing   Horizontal ABduction  Both;20 reps;Theraband    External Rotation  Left;20 reps;Theraband    Theraband Level (Shoulder External Rotation)  Level 2 (Red)    Internal Rotation  Left;20 reps;Theraband    Theraband Level (Shoulder Internal Rotation)  Level 3 (Green)    ABduction  20 reps;Left    ABduction Limitations  no  weight and then worked on 1# to 60 degrees without compensation used visual cues with mirror    Diagonals  Both    Diagonals Weight (lbs)  1#    Other Standing Exercises  ball throwing over the pull up bar    Other Standing Exercises  2# over head, 3# head high and 4# to shoulder height      Shoulder Exercises: ROM/Strengthening   UBE (Upper Arm Bike)  constant work 20 watts x 6 minutes, needed help to keep going and get back on top of the motion to keep the watts    Lat Pull  2.5 plate;20 reps    Cybex Row  2.5 plate;20 reps    "W" Arms  20 with PT overpressure    Other ROM/Strengthening Exercises  weighted ball pass around waist    Other ROM/Strengthening Exercises  25# triceps, 5# biceps      Shoulder Exercises: Stretch   Corner Stretch  3 reps;10 seconds    Star Gazer Stretch  2 reps;20 seconds    Other Shoulder Stretches  sleeper stretch  Manual Therapy   Manual Therapy  Passive ROM;Soft tissue mobilization    Passive ROM  all GH joint motions to end range, some contract relax               PT Short Term Goals - 10/11/18 1138      PT SHORT TERM GOAL #1   Title  independent with initial HEP    Status  Achieved        PT Long Term Goals - 12/06/18 1200      PT LONG TERM GOAL #1   Title  increase AROM of left shoulder flexion to 130 degrees    Status  Partially Met      PT LONG TERM GOAL #2   Title  increase ER of the leftshoulder to 70 degrees    Status  Partially Met      PT LONG TERM GOAL #3   Title  decrease pain 50%    Status  Partially Met      PT LONG TERM GOAL #4   Title  dress and do hair without difficulty    Status  Achieved            Plan - 12/06/18 1158    Clinical Impression Statement  Patient definitely needs cues verbal, tactile and visual to activate the correct mms and do the motions, otherwise she tends to fight herself.  Her ROM is slowly improving and the ability to reach into the cabinet is much better.    PT Next Visit  Plan  work on IR andfunctional strength, try to limit compensation    Consulted and Agree with Plan of Care  Patient       Patient will benefit from skilled therapeutic intervention in order to improve the following deficits and impairments:  Decreased range of motion, Impaired UE functional use, Increased muscle spasms, Decreased activity tolerance, Pain, Improper body mechanics, Decreased strength, Increased edema, Postural dysfunction  Visit Diagnosis: Acute pain of left shoulder  Stiffness of left shoulder, not elsewhere classified  Cramp and spasm     Problem List There are no active problems to display for this patient.   Sumner Boast., PT 12/06/2018, 12:02 PM  [address] [address] [place], [place], [address] Phone: [phone]   Fax:  [phone]  Name: Karan Inclan MRN: [id number] Date of Birth: 08-15-1941

## 2018-12-10 ENCOUNTER — Encounter: Payer: Self-pay | Admitting: Physical Therapy

## 2018-12-10 ENCOUNTER — Ambulatory Visit: Payer: Medicare Other | Admitting: Physical Therapy

## 2018-12-10 DIAGNOSIS — R252 Cramp and spasm: Secondary | ICD-10-CM

## 2018-12-10 DIAGNOSIS — M25512 Pain in left shoulder: Secondary | ICD-10-CM | POA: Diagnosis not present

## 2018-12-10 DIAGNOSIS — M25612 Stiffness of left shoulder, not elsewhere classified: Secondary | ICD-10-CM

## 2018-12-10 NOTE — Therapy (Signed)
[place], [place], [address] Phone: [phone]   Fax:  [phone] Progress Note Reporting Period 11/05/2018 to 12/10/2018 for visits 11-20  See note below for Objective Data and Assessment of Progress/Goals.      Physical Therapy Treatment  Patient Details  Name: Kim Grant MRN: [id number] Date of Birth: 04-13-1941 Referring Provider (PT): Davina Poke   Encounter Date: 12/10/2018  PT End of Session - 12/10/18 3419    Visit Number  20    Date for PT Re-Evaluation  12/22/18    PT Start Time  0926    PT Stop Time  1022    PT Time Calculation (min)  56 min    Activity Tolerance  Patient tolerated treatment well    Behavior During Therapy  [facility] for tasks assessed/performed;Anxious       Past Medical History:  Diagnosis Date  . Depression   . Hyperlipemia   . Osteoporosis     Past Surgical History:  Procedure Laterality Date  . ABDOMINAL HYSTERECTOMY    . BREAST SURGERY      There were no vitals filed for this visit.  Subjective Assessment - 12/10/18 0928    Subjective  REports that she had some pain int he bicep area this moring while cooking, "stirring and mixing"    Currently in Pain?  Yes    Pain Score  3     Pain Location  Shoulder    Pain Orientation  Left;Anterior    Aggravating Factors   stirring and mixing while cooking                       [facility] Adult PT Treatment/Exercise - 12/10/18 0001      Shoulder Exercises: Seated   Other Seated Exercises  bent over row 5#, bent over extension 3#, 1# bent over reverese flies      Shoulder Exercises: Standing   Horizontal ABduction  Both;20 reps;Theraband    Theraband Level (Shoulder Horizontal ABduction)  Level 2 (Red)    External Rotation  Left;20 reps;Theraband    Theraband Level (Shoulder External Rotation)  Level 2 (Red)    External Rotation Limitations  some cues for scapular retraction    Internal Rotation   Left;20 reps;Theraband    Theraband Level (Shoulder Internal Rotation)  Level 3 (Green)    Other Standing Exercises  ball throwing over the pull up bar    Other Standing Exercises  use of wand all AAROM some use of wall to get higher flexion, needs cues to extend elbow.  Tried some one arm with stick in doorway for flexion, she did well with this as long as she has the little bit of wall support      Shoulder Exercises: ROM/Strengthening   UBE (Upper Arm Bike)  constant work 25 watts x 6 minutes, needed help to keep going and get back on top of the motion to keep the watts    Lat Pull  2.5 plate;20 reps    Cybex Press  1 plate;20 reps    Cybex Press Limitations  had to go to no weight with a lot of cues to get elbow straight and go full ROM    Cybex Row  2.5 plate;20 reps    Other ROM/Strengthening Exercises  weighted ball rhythmic stabilization facing wall and facing away from the wall, around the waist pass    Other ROM/Strengthening Exercises  25# triceps, 5#  biceps      Shoulder Exercises: Stretch   Other Shoulder Stretches  sleeper stretch      Acupuncturist Location  left lateral shoulder    Electrical Stimulation Action  IFC    Electrical Stimulation Parameters  sitting    Electrical Stimulation Goals  Pain      Manual Therapy   Manual Therapy  Passive ROM    Passive ROM  I focus               PT Short Term Goals - 10/11/18 1138      PT SHORT TERM GOAL #1   Title  independent with initial HEP    Status  Achieved        PT Long Term Goals - 12/06/18 1200      PT LONG TERM GOAL #1   Title  increase AROM of left shoulder flexion to 130 degrees    Status  Partially Met      PT LONG TERM GOAL #2   Title  increase ER of the leftshoulder to 70 degrees    Status  Partially Met      PT LONG TERM GOAL #3   Title  decrease pain 50%    Status  Partially Met      PT LONG TERM GOAL #4   Title  dress and do hair without difficulty     Status  Achieved            Plan - 12/10/18 1131    Clinical Impression Statement  Patient is really struggling to get overhead motions, she does great with a little wall support or with cane, but without she really needs a lot of cues to get the correct mms and tends to fight herself.  The functional motions seem to help.    PT Next Visit Plan  work on IR andfunctional strength, try to limit compensation    Consulted and Agree with Plan of Care  Patient       Patient will benefit from skilled therapeutic intervention in order to improve the following deficits and impairments:  Decreased range of motion, Impaired UE functional use, Increased muscle spasms, Decreased activity tolerance, Pain, Improper body mechanics, Decreased strength, Increased edema, Postural dysfunction  Visit Diagnosis: Acute pain of left shoulder  Stiffness of left shoulder, not elsewhere classified  Cramp and spasm     Problem List There are no active problems to display for this patient.   Sumner Boast., PT 12/10/2018, 11:37 AM  [place] [address] [place], [place], [address] Phone: [phone]   Fax:  [phone]  Name: Kim Grant MRN: [id number] Date of Birth: April 09, 1941

## 2018-12-13 ENCOUNTER — Ambulatory Visit: Payer: Medicare Other | Admitting: Physical Therapy

## 2018-12-17 ENCOUNTER — Ambulatory Visit: Payer: Medicare Other | Admitting: Physical Therapy

## 2018-12-20 ENCOUNTER — Encounter: Payer: Self-pay | Admitting: Physical Therapy

## 2018-12-20 ENCOUNTER — Ambulatory Visit: Payer: Medicare Other | Admitting: Physical Therapy

## 2018-12-20 DIAGNOSIS — M25612 Stiffness of left shoulder, not elsewhere classified: Secondary | ICD-10-CM

## 2018-12-20 DIAGNOSIS — R252 Cramp and spasm: Secondary | ICD-10-CM

## 2018-12-20 DIAGNOSIS — M25512 Pain in left shoulder: Secondary | ICD-10-CM

## 2018-12-20 NOTE — Therapy (Signed)
[place] [address] [place], [place], [address] Phone: [phone]   Fax:  [phone]  Physical Therapy Treatment  Patient Details  Name: Kim Grant MRN: [id number] Date of Birth: [birth year] Referring Provider (PT): Davina Poke   Encounter Date: 12/20/2018  PT End of Session - 12/20/18 0958    Visit Number  21    Date for PT Re-Evaluation  12/22/18    PT Start Time  0926    PT Stop Time  1015    PT Time Calculation (min)  49 min    Activity Tolerance  Patient tolerated treatment well    Behavior During Therapy  [facility] for tasks assessed/performed;Anxious       Past Medical History:  Diagnosis Date  . Depression   . Hyperlipemia   . Osteoporosis     Past Surgical History:  Procedure Laterality Date  . ABDOMINAL HYSTERECTOMY    . BREAST SURGERY      There were no vitals filed for this visit.  Subjective Assessment - 12/20/18 0931    Subjective  Reports that she is feeling better, demonstrating good ROM, still difficulty with ER/IR    Currently in Pain?  Yes    Pain Score  2     Pain Location  Shoulder    Pain Orientation  Left;Anterior    Aggravating Factors   reaching behind back and up to do hair will increase the pain         [facility] PT Assessment - 12/20/18 0001      AROM   Left Shoulder Flexion  135 Degrees    Left Shoulder ABduction  106 Degrees    Left Shoulder Internal Rotation  56 Degrees    Left Shoulder External Rotation  79 Degrees                   [facility] Adult PT Treatment/Exercise - 12/20/18 0001      Shoulder Exercises: Standing   External Rotation  Left;20 reps;Theraband    Theraband Level (Shoulder External Rotation)  Level 2 (Red)    External Rotation Limitations  some cues for scapular retraction    Internal Rotation  Left;20 reps;Theraband    Theraband Level (Shoulder Internal Rotation)  Level 3 (Green)    Other Standing Exercises  3#, 2# and 1# cabinet reaching  to head high shelf      Shoulder Exercises: ROM/Strengthening   UBE (Upper Arm Bike)  constant work 25 watts x 6 minutes, needed help to keep going and get back on top of the motion to keep the watts    Lat Pull  2.5 plate;20 reps    Cybex Press  1 plate;20 reps    Cybex Row  2.5 plate;20 reps    "W" Arms  20 with PT overpressure    Other ROM/Strengthening Exercises  weighted ball rhythmic stabilization facing wall and facing away from the wall, around the waist pass      Shoulder Exercises: Stretch   Corner Stretch  3 reps;10 seconds    Star Gazer Stretch  3 reps;20 seconds    Other Shoulder Stretches  sleeper Mudlogger Location  left lateral shoulder    Electrical Stimulation Action  IFC    Electrical Stimulation Parameters  sitting    Electrical Stimulation Goals  Pain      Manual Therapy   Manual Therapy  Passive ROM  Passive ROM  GH shoulder motions with some contract relax to gain ROM               PT Short Term Goals - 10/11/18 1138      PT SHORT TERM GOAL #1   Title  independent with initial HEP    Status  Achieved        PT Long Term Goals - 12/20/18 1001      PT LONG TERM GOAL #1   Title  increase AROM of left shoulder flexion to 130 degrees    Status  Achieved      PT LONG TERM GOAL #2   Title  increase ER of the leftshoulder to 70 degrees    Status  Achieved      PT LONG TERM GOAL #3   Title  decrease pain 50%    Status  Partially Met      PT LONG TERM GOAL #4   Title  dress and do hair without difficulty      PT LONG TERM GOAL #5   Title  lift 3# into head high cabinet    Status  Partially Met            Plan - 12/20/18 0958    Clinical Impression Statement  Patient overall is doing well, gaining AROM slowly, she has struggled with carryover from visit to visit with her strength, she tends to compensate a lot and at times it seems that her mms forget how to work and tends to  compensate even more, with cues she can do all correctly, she does fatigue quickly but over the past week she seems to have a little less fatigue    PT Next Visit Plan  work on IR andfunctional strength, try to limit compensation    Consulted and Agree with Plan of Care  Patient       Patient will benefit from skilled therapeutic intervention in order to improve the following deficits and impairments:  Decreased range of motion, Impaired UE functional use, Increased muscle spasms, Decreased activity tolerance, Pain, Improper body mechanics, Decreased strength, Increased edema, Postural dysfunction  Visit Diagnosis: Acute pain of left shoulder  Stiffness of left shoulder, not elsewhere classified  Cramp and spasm     Problem List There are no active problems to display for this patient.   Sumner Boast., PT 12/20/2018, 10:02 AM  [place] [address] [place], [place], [address] Phone: [phone]   Fax:  [phone]  Name: Kim Grant MRN: [id number] Date of Birth: Mar 12, 1941

## 2018-12-24 ENCOUNTER — Encounter: Payer: Self-pay | Admitting: Physical Therapy

## 2018-12-24 ENCOUNTER — Ambulatory Visit: Payer: Medicare Other | Attending: Sports Medicine | Admitting: Physical Therapy

## 2018-12-24 DIAGNOSIS — R252 Cramp and spasm: Secondary | ICD-10-CM

## 2018-12-24 DIAGNOSIS — M25512 Pain in left shoulder: Secondary | ICD-10-CM

## 2018-12-24 DIAGNOSIS — M25612 Stiffness of left shoulder, not elsewhere classified: Secondary | ICD-10-CM

## 2018-12-24 NOTE — Therapy (Signed)
[place] [address] [place], [place], [address] Phone: [phone]   Fax:  [phone]  Physical Therapy Treatment  Patient Details  Name: Kim Grant MRN: [id number] Date of Birth: [birth year] Referring Provider (PT): Davina Poke   Encounter Date: 12/24/2018  PT End of Session - 12/24/18 1142    Visit Number  22    Date for PT Re-Evaluation  01/24/19    PT Start Time  1607    PT Stop Time  1153    PT Time Calculation (min)  61 min    Activity Tolerance  Patient tolerated treatment well    Behavior During Therapy  [facility] for tasks assessed/performed;Anxious       Past Medical History:  Diagnosis Date  . Depression   . Hyperlipemia   . Osteoporosis     Past Surgical History:  Procedure Laterality Date  . ABDOMINAL HYSTERECTOMY    . BREAST SURGERY      There were no vitals filed for this visit.  Subjective Assessment - 12/24/18 1101    Subjective  Reports that she is still having difficulty behind her back    Currently in Pain?  Yes    Pain Score  2     Pain Location  Shoulder    Pain Orientation  Left;Anterior    Aggravating Factors   reaching behind her back         [facility] PT Assessment - 12/24/18 0001      Assessment   Medical Diagnosis  s/p left RC repair and biceps repair    Referring Provider (PT)  M[facility] Adult PT Treatment/Exercise - 12/24/18 0001      Shoulder Exercises: Seated   External Rotation  Left;20 reps;Weights    External Rotation Weight (lbs)  2-3#    External Rotation Limitations  with the elbow up on physioball    Other Seated Exercises  bent over row 5#, bent over extension 3#, 1# bent over reverese flies      Shoulder Exercises: Standing   Horizontal ABduction  Both;20 reps;Theraband    Theraband Level (Shoulder Horizontal ABduction)  Level 2 (Red)    External Rotation  Left;20 reps;Theraband    Theraband Level (Shoulder External Rotation)   Level 2 (Red)    Other Standing Exercises  ball throwing over the pull up bar      Shoulder Exercises: ROM/Strengthening   UBE (Upper Arm Bike)  6 minutes constant work 25watts    Lat Pull  2.5 plate;20 reps    Cybex Press  1 plate;20 reps    Cybex Row  2.5 plate;20 reps    "W" Arms  20 with PT overpressure    Other ROM/Strengthening Exercises  weighted ball rhythmic stabilization facing wall and facing away from the wall, around the waist pass    Other ROM/Strengthening Exercises  25# triceps, 5# biceps, 2# overhead carry, 3# head high cabinet reaching.      Acupuncturist Location  left lateral shoulder    Electrical Stimulation Action  IFC    Electrical Stimulation Parameters  sitting    Electrical Stimulation Goals  Pain               PT Short Term Goals - 10/11/18 1138      PT SHORT TERM GOAL #1   Title  independent with  initial HEP    Status  Achieved        PT Long Term Goals - 12/24/18 1145      PT LONG TERM GOAL #1   Title  increase AROM of left shoulder flexion to 130 degrees    Status  Achieved      PT LONG TERM GOAL #2   Title  increase ER of the leftshoulder to 70 degrees    Status  Achieved      PT LONG TERM GOAL #3   Title  decrease pain 50%    Status  Partially Met      PT LONG TERM GOAL #4   Title  dress and do hair without difficulty    Status  Achieved      PT LONG TERM GOAL #5   Title  lift 5# into head high cabinet    Status  Partially Met            Plan - 12/24/18 1143    Clinical Impression Statement  Patient is improving greatly, she is now able to reach a head high cabinet with 3#, has been able to do this with 5# if close to the body, she does struggle at times when she is fatigued or at times when she comes in and it is her first attempt that day, almost like her mms forgot what weh are asking and has a lot of compensation.  She is reporting less overall pain and easier ADL's    PT Next  Visit Plan  will work on maximizing her function    Consulted and Agree with Plan of Care  Patient       Patient will benefit from skilled therapeutic intervention in order to improve the following deficits and impairments:  Decreased range of motion, Impaired UE functional use, Increased muscle spasms, Decreased activity tolerance, Pain, Improper body mechanics, Decreased strength, Increased edema, Postural dysfunction  Visit Diagnosis: Acute pain of left shoulder - Plan: PT plan of care cert/re-cert  Stiffness of left shoulder, not elsewhere classified - Plan: PT plan of care cert/re-cert  Cramp and spasm - Plan: PT plan of care cert/re-cert     Problem List There are no active problems to display for this patient.   Sumner Boast., PT 12/24/2018, 11:46 AM  [place], [place], [address] Phone: [phone]   Fax:  [phone]  Name: Kim Grant MRN: [id number] Date of Birth: [birth year]

## 2018-12-26 ENCOUNTER — Encounter: Payer: Medicare Other | Admitting: Physical Therapy

## 2018-12-31 ENCOUNTER — Encounter: Payer: Self-pay | Admitting: Physical Therapy

## 2018-12-31 ENCOUNTER — Ambulatory Visit: Payer: Medicare Other | Admitting: Physical Therapy

## 2018-12-31 DIAGNOSIS — M25612 Stiffness of left shoulder, not elsewhere classified: Secondary | ICD-10-CM

## 2018-12-31 DIAGNOSIS — M25512 Pain in left shoulder: Secondary | ICD-10-CM

## 2018-12-31 DIAGNOSIS — R252 Cramp and spasm: Secondary | ICD-10-CM

## 2018-12-31 NOTE — Therapy (Signed)
[facility] [place] [address] [place], [place], [address] Phone: [phone]   Fax:  [phone]  Physical Therapy Treatment  Patient Details  Name: Niambi Hyett MRN: [id number] Date of Birth: Nov 19, 1940 Referring Provider (PT): Leonel Ramsay   Encounter Date: 12/31/2018  PT End of Session - 12/31/18 1136    Visit Number  23    Date for PT Re-Evaluation  01/24/19    PT Start Time  1055    PT Stop Time  1140    PT Time Calculation (min)  45 min    Activity Tolerance  Patient tolerated treatment well    Behavior During Therapy  [facility] for tasks assessed/performed;Anxious       Past Medical History:  Diagnosis Date  . Depression   . Hyperlipemia   . Osteoporosis     Past Surgical History:  Procedure Laterality Date  . ABDOMINAL HYSTERECTOMY    . BREAST SURGERY      There were no vitals filed for this visit.  Subjective Assessment - 12/31/18 1100    Subjective  Patient reports that she worked over the weekend and did very well, less pain and less difficulty.  C/O tightness in the anterior lateral shoulder with reaching over shoulder height    Currently in Pain?  Yes    Pain Score  1     Pain Location  Shoulder    Pain Orientation  Left;Anterior    Pain Descriptors / Indicators  Tightness    Aggravating Factors   reaching                       [facility] Adult PT Treatment/Exercise - 12/31/18 0001      Shoulder Exercises: Seated   Other Seated Exercises  bent over row 5#, bent over extension 3#, 1# bent over reverese flies      Shoulder Exercises: Standing   Other Standing Exercises  4#, 3# and 2# cabinet reaching to shoulder height and to head high, reps of 5      Shoulder Exercises: ROM/Strengthening   UBE (Upper Arm Bike)  6 minutes constant work 25watts    Lat Pull  3 plate;20 reps    Cybex Row  2.5 plate;20 reps    Wall Wash  flexion, circles and scaption and overhead side to side, used 2#    Other  ROM/Strengthening Exercises  25# triceps, 10# biceps, 2# overhead carry      Manual Therapy   Manual Therapy  Passive ROM    Passive ROM  all GH joint motions taking to her end range and holding               PT Short Term Goals - 10/11/18 1138      PT SHORT TERM GOAL #1   Title  independent with initial HEP    Status  Achieved        PT Long Term Goals - 12/31/18 1140      Additional Long Term Goals   Additional Long Term Goals  Yes      PT LONG TERM GOAL #6   Title  independent with advanced HEP/gym    Time  4    Period  Weeks            Plan - 12/31/18 1138    Clinical Impression Statement  Started really educating patine on what to do when we discharge her, she has a lot of questions and  reports that she is unsure if she can do on her own, I reinforced that she is doing very well and that if she can do a few things she will continue to progress, her strength is the issue after 5 reps she really starts to fatigue.  I also have reservations about her ROM and doing on her own, but really feel that she can do it    PT Next Visit Plan  over the next 2-3 weeks really work on her independence iwth gyma nd HEP    Consulted and Agree with Plan of Care  Patient       Patient will benefit from skilled therapeutic intervention in order to improve the following deficits and impairments:  Decreased range of motion, Impaired UE functional use, Increased muscle spasms, Decreased activity tolerance, Pain, Improper body mechanics, Decreased strength, Increased edema, Postural dysfunction  Visit Diagnosis: Acute pain of left shoulder  Stiffness of left shoulder, not elsewhere classified  Cramp and spasm     Problem List There are no active problems to display for this patient.   Jearld Lesch., PT 12/31/2018, 11:41 AM  [facility] [address] [place], [place], [address] Phone: [phone]   Fax:   [phone]  Name: Terilynn Ruvalcaba MRN: [id number] Date of Birth: 1940/10/30

## 2019-01-03 ENCOUNTER — Ambulatory Visit: Payer: Medicare Other | Admitting: Physical Therapy

## 2019-01-07 ENCOUNTER — Ambulatory Visit: Payer: Medicare Other | Admitting: Physical Therapy

## 2019-01-07 ENCOUNTER — Other Ambulatory Visit: Payer: Self-pay

## 2019-01-07 ENCOUNTER — Encounter: Payer: Self-pay | Admitting: Physical Therapy

## 2019-01-07 DIAGNOSIS — M25612 Stiffness of left shoulder, not elsewhere classified: Secondary | ICD-10-CM

## 2019-01-07 DIAGNOSIS — M25512 Pain in left shoulder: Secondary | ICD-10-CM | POA: Diagnosis not present

## 2019-01-07 DIAGNOSIS — R252 Cramp and spasm: Secondary | ICD-10-CM

## 2019-01-07 NOTE — Therapy (Signed)
[place] [address] [place], [place], [address] Phone: [phone]   Fax:  [phone]  Physical Therapy Treatment  Patient Details  Name: Kim Grant MRN: [id number] Date of Birth: November 23, 1940 Referring Provider (PT): Davina Poke   Encounter Date: 01/07/2019  PT End of Session - 01/07/19 1151    Visit Number  24    Date for PT Re-Evaluation  01/24/19    PT Start Time  1050    PT Stop Time  1133    PT Time Calculation (min)  43 min    Activity Tolerance  Patient tolerated treatment well    Behavior During Therapy  [facility] for tasks assessed/performed;Anxious       Past Medical History:  Diagnosis Date  . Depression   . Hyperlipemia   . Osteoporosis     Past Surgical History:  Procedure Laterality Date  . ABDOMINAL HYSTERECTOMY    . BREAST SURGERY      There were no vitals filed for this visit.  Subjective Assessment - 01/07/19 1103    Subjective  Patient is doing well has reported easier to dress and do hair, she reports that she is sore    Currently in Pain?  Yes    Pain Score  2     Pain Location  Shoulder    Pain Orientation  Left;Anterior    Pain Descriptors / Indicators  Sore    Aggravating Factors   lifting         [facility] PT Assessment - 01/07/19 0001      AROM   Left Shoulder Flexion  141 Degrees    Left Shoulder ABduction  126 Degrees    Left Shoulder Internal Rotation  58 Degrees    Left Shoulder External Rotation  80 Degrees                   [facility] Adult PT Treatment/Exercise - 01/07/19 0001      Self-Care   Self-Care  Other Self-Care Comments    Other Self-Care Comments   really started working with patient on discharge and what to do at the gym, we went over the equipment, what is is called and the weights, she is new to any gym setting and really needs a lot of cues and practice.  She may need assistance to do this, but we are starting a flow sheet for her with the weight,  the name of the exercise and the reps, she has a lot of questions about this and needs guidance.      Shoulder Exercises: Standing   Other Standing Exercises  practiced getting up from floor as she reports that this is her goal               PT Short Term Goals - 10/11/18 1138      PT SHORT TERM GOAL #1   Title  independent with initial HEP    Status  Achieved        PT Long Term Goals - 01/07/19 1152      PT LONG TERM GOAL #3   Title  decrease pain 50%    Status  Partially Met      PT LONG TERM GOAL #6   Title  independent with advanced HEP/gym    Status  On-going            Plan - 01/07/19 1151    Clinical Impression Statement  Patient with very good gains  in ROM, we are starting the education about what she is going to do after PT D/C, she really needs a lot of cues as sto know the machines and what they are called and will definitely need help with setting up the machines    PT Next Visit Plan  look to hold or D/C if she can understand the machines and be safe    Consulted and Agree with Plan of Care  Patient       Patient will benefit from skilled therapeutic intervention in order to improve the following deficits and impairments:  Decreased range of motion, Impaired UE functional use, Increased muscle spasms, Decreased activity tolerance, Pain, Improper body mechanics, Decreased strength, Increased edema, Postural dysfunction  Visit Diagnosis: Acute pain of left shoulder  Stiffness of left shoulder, not elsewhere classified  Cramp and spasm     Problem List There are no active problems to display for this patient.   Sumner Boast., PT 01/07/2019, 11:53 AM  [place] [address] [place], [place], [address] Phone: [phone]   Fax:  [phone]  Name: Kim Grant MRN: [id number] Date of Birth: 1941-02-11

## 2019-01-10 ENCOUNTER — Ambulatory Visit: Payer: Medicare Other | Admitting: Physical Therapy

## 2019-01-14 ENCOUNTER — Ambulatory Visit: Payer: Medicare Other | Admitting: Physical Therapy

## 2019-01-17 ENCOUNTER — Ambulatory Visit: Payer: Medicare Other | Admitting: Physical Therapy

## 2019-01-21 ENCOUNTER — Encounter: Payer: Medicare Other | Admitting: Physical Therapy

## 2019-03-20 ENCOUNTER — Encounter: Payer: Self-pay | Admitting: Family Medicine

## 2019-03-20 ENCOUNTER — Ambulatory Visit (INDEPENDENT_AMBULATORY_CARE_PROVIDER_SITE_OTHER): Payer: Medicare Other | Admitting: Family Medicine

## 2019-03-20 VITALS — BP 126/78 | HR 65 | Temp 98.1°F | Ht 65.0 in | Wt 169.0 lb

## 2019-03-20 DIAGNOSIS — M810 Age-related osteoporosis without current pathological fracture: Secondary | ICD-10-CM

## 2019-03-20 DIAGNOSIS — Z7689 Persons encountering health services in other specified circumstances: Secondary | ICD-10-CM

## 2019-03-20 DIAGNOSIS — Z0000 Encounter for general adult medical examination without abnormal findings: Secondary | ICD-10-CM

## 2019-03-20 DIAGNOSIS — E7800 Pure hypercholesterolemia, unspecified: Secondary | ICD-10-CM

## 2019-03-20 DIAGNOSIS — Z23 Encounter for immunization: Secondary | ICD-10-CM | POA: Diagnosis not present

## 2019-03-20 DIAGNOSIS — F330 Major depressive disorder, recurrent, mild: Secondary | ICD-10-CM

## 2019-03-20 DIAGNOSIS — E782 Mixed hyperlipidemia: Secondary | ICD-10-CM

## 2019-03-20 DIAGNOSIS — K2270 Barrett's esophagus without dysplasia: Secondary | ICD-10-CM

## 2019-03-20 LAB — BASIC METABOLIC PANEL
BUN: 20 mg/dL (ref 6–23)
CO2: 28 mEq/L (ref 19–32)
Calcium: 10.2 mg/dL (ref 8.4–10.5)
Chloride: 102 mEq/L (ref 96–112)
Creatinine, Ser: 0.98 mg/dL (ref 0.40–1.20)
GFR: 55.02 mL/min — ABNORMAL LOW (ref >60.00)
Glucose, Bld: 99 mg/dL (ref 70–99)
Potassium: 5.3 mEq/L — ABNORMAL HIGH (ref 3.5–5.1)
Sodium: 139 mEq/L (ref 135–145)

## 2019-03-20 LAB — VITAMIN D 25 HYDROXY (VIT D DEFICIENCY, FRACTURES): VITD: 31.73 ng/mL (ref 30.00–100.00)

## 2019-03-20 LAB — ALT: ALT: 34 U/L (ref 0–35)

## 2019-03-20 LAB — LIPID PANEL
Cholesterol: 269 mg/dL — ABNORMAL HIGH (ref 0–200)
HDL: 72.5 mg/dL (ref >39.00)
LDL Cholesterol: 161 mg/dL — ABNORMAL HIGH (ref 0–99)
NonHDL: 196.97
Total CHOL/HDL Ratio: 4
Triglycerides: 181 mg/dL — ABNORMAL HIGH (ref 0.0–149.0)
VLDL: 36.2 mg/dL (ref 0.0–40.0)

## 2019-03-20 LAB — CBC
HCT: 34.7 % — ABNORMAL LOW (ref 36.0–46.0)
Hemoglobin: 11.8 g/dL — ABNORMAL LOW (ref 12.0–15.0)
MCHC: 34 g/dL (ref 30.0–36.0)
MCV: 83.1 fl (ref 78.0–100.0)
Platelets: 293 10*3/uL (ref 150.0–400.0)
RBC: 4.18 Mil/uL (ref 3.87–5.11)
RDW: 14 % (ref 11.5–15.5)
WBC: 5.9 10*3/uL (ref 4.0–10.5)

## 2019-03-20 LAB — AST: AST: 34 U/L (ref 0–37)

## 2019-03-20 MED ORDER — CITALOPRAM HYDROBROMIDE 40 MG PO TABS: 40.0000 mg | ORAL_TABLET | Freq: Every day | ORAL | 3 refills | Status: DC

## 2019-03-20 MED ORDER — PRAVASTATIN SODIUM 20 MG PO TABS: 20.0000 mg | ORAL_TABLET | Freq: Every day | ORAL | 3 refills | Status: DC

## 2019-03-20 NOTE — Progress Notes (Signed)
Kim Grant is a 78 y.o. female  Chief Complaint  Patient presents with  . Establish Care    Est care/ CPE/ fasting / not sure last DEXA takes prolia injections /    HPI: Kim Grant is a 78 y.o. female here to establish care with our office and for her annual CPE. She is fasting for labs. Her previous PCP was Dr. Tarri Fuller with [facility]. 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 She is widowed, has 2 grown sons. 2 grand daughters, 1 great granddaughter who is 1yo.  Pt has a PMH significant for osteoporosis for which she takes calcium and Vit D supplement and q74mo prolia injections (pt has appt 03/2019), hyperlidipemia for which she takes pravastatin 20mg  daily, and depression for which she takes celexa 20mg  daily. She was on 40mg  in the past and would like to go back to 40mg . She also has a h/o GERD and Barrett's esophagitis - last EGD 10/2017 and scheduled for repeat EGD tomorrow. She takes famotidine 20mg  BID. She states a few years ago she saw cardio Oceanographer in [place]) and had echo for murmur - she believes dx was aortic stenosis.   Last Dexa: 01/2018 - osteoporosis with T-score = -3.1 Last colonoscopy: 10/2017 - no further colonoscopy needed/recommended for cancer screening d/t age  Med refills needed today? Pravastatin, celexa  Specialists: GI (GERD, Barrett's), ortho (Lt rotator cuff tear - s/p surgical repair in 07/2018), pulmonary (chronic bronchitis) - PFTs in 05/2018 - normal spirometry with mild reduction in diffusing capacity   Past Medical History:  Diagnosis Date  . Depression   . Hyperlipemia   . Osteoporosis     Past Surgical History:  Procedure Laterality Date  . ABDOMINAL HYSTERECTOMY    . BREAST SURGERY    . ROTATOR CUFF REPAIR      Social History   Socioeconomic History  . Marital status: Widowed    Spouse name: Not on file  . Number of children: Not on file  . Years of education: Not on file  . Highest education  level: Not on file  Occupational History  . Not on file  Social Needs  . Financial resource strain: Not on file  . Food insecurity:    Worry: Not on file    Inability: Not on file  . Transportation needs:    Medical: Not on file    Non-medical: Not on file  Tobacco Use  . Smoking status: Former Smoker    Last attempt to quit: 02/28/2013    Years since quitting: 6.0  . Smokeless tobacco: Never Used  Substance and Sexual Activity  . Alcohol use: No  . Drug use: No  . Sexual activity: Never  Lifestyle  . Physical activity:    Days per week: Not on file    Minutes per session: Not on file  . Stress: Not on file  Relationships  . Social connections:    Talks on phone: Not on file    Gets together: Not on file    Attends religious service: Not on file    Active member of club or organization: Not on file    Attends meetings of clubs or organizations: Not on file    Relationship status: Not on file  . Intimate partner violence:    Fear of current or ex partner: Not on file    Emotionally abused: Not on file    Physically abused: Not on file  Forced sexual activity: Not on file  Other Topics Concern  . 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 She is widowed, has 2 grown sons. 2 grand daughters, 1 great granddaughter who is 1yo.     Family History  Problem Relation Age of Onset  . Cancer Mother        ovarian   . Heart disease Father      Immunization History  Administered Date(s) Administered  . Influenza, High Dose Seasonal PF 07/10/2016, 07/05/2018  . Influenza-Unspecified 08/27/2007, 09/09/2010, 07/23/2015, 07/10/2016  . Pneumococcal Conjugate-13 08/28/2017  . Pneumococcal Polysaccharide-23 09/19/2010  . Td 02/09/2007  . Tdap 03/20/2019    Outpatient Encounter Medications as of 03/20/2019  Medication Sig Note  . calcium citrate-vitamin D (CITRACAL+D) 315-200 MG-UNIT per tablet Take 3 tablets by mouth  daily.   . citalopram (CELEXA) 40 MG tablet Take 1 tablet (40 mg total) by mouth daily.   . famotidine (PEPCID) 20 MG tablet Take by mouth.   Marland Kitchen ibuprofen (ADVIL,MOTRIN) 200 MG tablet Take 200 mg by mouth every 6 (six) hours as needed for mild pain or moderate pain.   . meclizine (ANTIVERT) 25 MG tablet Take 1 tablet (25 mg total) by mouth 3 (three) times daily as needed for dizziness.   . Multiple Vitamin (MULTIVITAMIN WITH MINERALS) TABS tablet Take 1 tablet by mouth daily.   . pravastatin (PRAVACHOL) 20 MG tablet Take 1 tablet (20 mg total) by mouth daily.   Marland Kitchen triamcinolone cream (KENALOG) 0.1 % Apply topically.   . [DISCONTINUED] citalopram (CELEXA) 20 MG tablet Take 30 mg by mouth daily.  08/31/2013: Received from: External Pharmacy  . [DISCONTINUED] pravastatin (PRAVACHOL) 20 MG tablet    . [DISCONTINUED] atorvastatin (LIPITOR) 40 MG tablet Take 40 mg by mouth every evening.  08/31/2013: Received from: External Pharmacy  . [DISCONTINUED] diazepam (VALIUM) 2 MG tablet Take 1 tablet (2 mg total) by mouth every 6 (six) hours as needed for anxiety (dizziness).   . [DISCONTINUED] fish oil-omega-3 fatty acids 1000 MG capsule Take 2 g by mouth daily.   . [DISCONTINUED] phentermine 37.5 MG capsule Take 37.5 mg by mouth every morning.    No facility-administered encounter medications on file as of 03/20/2019.      ROS: Gen: no fever, chills  Skin: no rash, itching ENT: no ear pain, ear drainage, nasal congestion, rhinorrhea, sinus pressure, sore throat Eyes: no blurry vision, double vision Resp: no cough, wheeze,SOB Breast: no breast tenderness, no nipple discharge, no breast masses CV: no CP, palpitations, LE edema,  GI: + occasional heartburn, no n/v/d/c, abd pain GU: no dysuria, urgency, frequency, hematuria; no vaginal itching, odor, discharge MSK: no joint pain, myalgias, back pain Neuro: no dizziness, headache, weakness, vertigo Psych: + depression, some anxiety, no insomnia   No  Known Allergies  BP 126/78   Pulse 65   Temp 98.1 F (36.7 C) (Oral)   Ht  (1.651 m)   Wt 169 lb (76.7 kg)   SpO2 96%   BMI 28.12 kg/m   Physical Exam  Constitutional: She is oriented to person, place, and time. She appears well-developed and well-nourished.  HENT:  Head: Normocephalic and atraumatic.  Right Ear: Tympanic membrane and ear canal normal.  Left Ear: Tympanic membrane and ear canal normal.  Nose: Nose normal.  Mouth/Throat: Oropharynx is clear and moist and mucous membranes are normal.  Neck: Neck supple. No JVD present.  No thyromegaly present.  Cardiovascular: Normal rate, regular rhythm and intact distal pulses.  Murmur heard. Pulmonary/Chest: Effort normal and breath sounds normal. No respiratory distress.  Abdominal: Soft. She exhibits no distension and no mass. There is no abdominal tenderness.  Musculoskeletal: Normal range of motion.        General: No edema.  Lymphadenopathy:    She has no cervical adenopathy.  Neurological: She is alert and oriented to person, place, and time.  Skin: Skin is warm and dry.  Psychiatric: She has a normal mood and affect. Her behavior is normal.     A/P:  1. Encounter to establish care with new doctor  2. Annual physical exam - cont with overall healthy diet, exercise/activity as tolerated - UTD on dental and vision exams - immunizations UTD - ALT - AST - Basic metabolic panel - Lipid panel - VITAMIN D 25 Hydroxy (Vit-D Deficiency, Fractures) - CBC  3. Need for Tdap vaccination - Tdap vaccine greater than or equal to 7yo IM  4. Pure hypercholesterolemia - Lipid panel Refill: - pravastatin (PRAVACHOL) 20 MG tablet; Take 1 tablet (20 mg total) by mouth daily.  Dispense: 90 tablet; Refill: 3  5. Mild episode of recurrent major depressive disorder (HCC) Increase from 20mg : - citalopram (CELEXA) 40 MG tablet; Take 1 tablet (40 mg total) by mouth daily.  Dispense: 90 tablet; Refill: 3  7. Barrett's  esophagus without dysplasia - follows with GI, EGD scheduled for tomorrow - cont pepcid 20mg  BID  8. Age-related osteoporosis without current pathological fracture - last Dexa 01/2018 - cont prolia injections - next one in 03/2019

## 2019-03-21 ENCOUNTER — Other Ambulatory Visit: Payer: Self-pay | Admitting: Family Medicine

## 2019-03-21 DIAGNOSIS — E782 Mixed hyperlipidemia: Secondary | ICD-10-CM

## 2019-03-21 MED ORDER — PRAVASTATIN SODIUM 40 MG PO TABS: 40.0000 mg | ORAL_TABLET | Freq: Every day | ORAL | 3 refills | Status: DC

## 2019-03-24 ENCOUNTER — Telehealth: Payer: Self-pay

## 2019-03-24 NOTE — Telephone Encounter (Signed)
Pt requesting a call back when Dr Barron Alvine receives the results of her echocardiogram (07/2015)

## 2019-03-24 NOTE — Telephone Encounter (Signed)
Will call pt once these records are received.

## 2019-04-07 NOTE — Telephone Encounter (Signed)
Left VM for pt to return call, need to inform pt I have not received any echo results and was going to get more information so that I could request the results.

## 2019-04-09 ENCOUNTER — Encounter: Payer: Medicare Other | Admitting: Family Medicine

## 2019-04-09 NOTE — Telephone Encounter (Signed)
Spoke with pt regarding some insurance issues/ Given to tanya will return pt call once we get to bottom of.

## 2019-04-28 DIAGNOSIS — E7800 Pure hypercholesterolemia, unspecified: Secondary | ICD-10-CM | POA: Insufficient documentation

## 2019-11-29 ENCOUNTER — Ambulatory Visit: Payer: Medicare Other

## 2019-12-01 ENCOUNTER — Ambulatory Visit: Payer: Medicare Other | Attending: Internal Medicine

## 2019-12-01 DIAGNOSIS — Z23 Encounter for immunization: Secondary | ICD-10-CM | POA: Insufficient documentation

## 2019-12-01 NOTE — Progress Notes (Signed)
Patient came back 2 hrs post vaccine complaining of SOB. Assessed vitals: BP 129/71 P 65 O2 100% R 20. If symptoms continue, patient was informed to get in touch with PCP.

## 2019-12-01 NOTE — Progress Notes (Signed)
   Covid-19 Vaccination Clinic  Name:  Delani Kohli    MRN: [id number] DOB: 1941-05-23  12/01/2019  Ms. Wilmott was observed post Covid-19 immunization for 15 minutes without incidence. She was provided with Vaccine Information Sheet and instruction to access the V-Safe system.   Ms. Perdew was instructed to call 911 with any severe reactions post vaccine: Marland Kitchen Difficulty breathing  . Swelling of your face and throat  . A fast heartbeat  . A bad rash all over your body  . Dizziness and weakness    Immunizations Administered    Name Date Dose VIS Date Route   Pfizer COVID-19 Vaccine 12/01/2019  8:47 AM 0.3 mL 10/03/2019 Intramuscular   Manufacturer: [employer], [employer]   Lot: [id number]   NDC: [address]-3

## 2019-12-18 ENCOUNTER — Other Ambulatory Visit: Payer: Self-pay | Admitting: Family Medicine

## 2019-12-18 DIAGNOSIS — F330 Major depressive disorder, recurrent, mild: Secondary | ICD-10-CM

## 2019-12-18 DIAGNOSIS — E782 Mixed hyperlipidemia: Secondary | ICD-10-CM

## 2019-12-18 NOTE — Telephone Encounter (Signed)
Last OV 03/20/19 Last fill 03/20/19  #90/3 Pt need a follow up visit?

## 2019-12-18 NOTE — Telephone Encounter (Signed)
Ok thank you for the update. I have never had a patient with a similar issue, at least to my knowledge. DO and MD are both licensed physicians and equal in scope of practice. I have seen this with NP and Pas. Hope she can get it sorted out

## 2019-12-18 NOTE — Telephone Encounter (Signed)
I spoke with pt to schedule a follow up in June.  Pt informed me that her insurance will not pay for her visits due to Dr. Salena Saner title of DO.  Pt will look into way would that make a difference because she would like to continue care with Dr. Salena Saner.  Pt will call office back to let us know the outcome.   Rx sent in today, per Dr.C.

## 2019-12-18 NOTE — Telephone Encounter (Signed)
90 day refill is fine and then appt in 03/2020. Thank you

## 2019-12-23 ENCOUNTER — Ambulatory Visit: Payer: Medicare Other | Attending: Internal Medicine

## 2019-12-23 DIAGNOSIS — Z23 Encounter for immunization: Secondary | ICD-10-CM | POA: Insufficient documentation

## 2019-12-23 NOTE — Progress Notes (Signed)
   Covid-19 Vaccination Clinic  Name:  Kim Grant    MRN: [id number] DOB: 23-Apr-1941  12/23/2019  Kim Grant was observed post Covid-19 immunization for 15 minutes without incident. She was provided with Vaccine Information Sheet and instruction to access the V-Safe system.   Kim Grant was instructed to call 911 with any severe reactions post vaccine: Marland Kitchen Difficulty breathing  . Swelling of face and throat  . A fast heartbeat  . A bad rash all over body  . Dizziness and weakness   Immunizations Administered    Name Date Dose VIS Date Route   Pfizer COVID-19 Vaccine 12/23/2019  9:48 AM 0.3 mL 10/03/2019 Intramuscular   Manufacturer: [employer], [employer]   Lot: [id number]   NDC: [address]-9

## 2019-12-24 ENCOUNTER — Ambulatory Visit: Payer: Medicare Other

## 2020-01-05 ENCOUNTER — Telehealth: Payer: Self-pay | Admitting: Family Medicine

## 2020-01-05 NOTE — Telephone Encounter (Signed)
My name is Verlee Rossetti, a Care Guide at your office. I was in the process of scheduling Mrs. Kim Grant her Kim Grant with [facility], and she stated that her insurance [employer] Medicare see Dr Luana Shu as a specialist (DO) not family practice.  She stated she received a bill for this visit.  She would like to continue seeing Dr. Barron Alvine as her provider, but she do not know what to do.  She would like for Dr. Barron Alvine to call her [phone]), so this can be sorted out with her insurance and what she can do to keep you as her provider.   Just reaching out to you as patient asked me too.   Thanks,  Olegario Messier

## 2020-01-06 NOTE — Telephone Encounter (Signed)
Sent message to [place].

## 2020-01-27 NOTE — Telephone Encounter (Signed)
Pt called back and said she did speak with her insurance and say that they still have her listed as a specialist, She said she wants to speak to [facility] again, Please advise. [phone]

## 2020-01-28 NOTE — Telephone Encounter (Signed)
Returned call to pt and sent staff msg

## 2020-04-29 NOTE — Telephone Encounter (Signed)
error 

## 2020-05-11 ENCOUNTER — Telehealth: Payer: Self-pay | Admitting: Family Medicine

## 2020-05-11 NOTE — Telephone Encounter (Signed)
Patient is calling and wanted to see if she needed to schedule a Medicare Annual Wellness with the Health Coach or a Wellness check with Dr. Salena Saner. please advise. CB is [phone]

## 2020-05-12 NOTE — Telephone Encounter (Signed)
Left message for pt to call back  °

## 2020-05-13 DIAGNOSIS — H6123 Impacted cerumen, bilateral: Secondary | ICD-10-CM

## 2020-05-13 HISTORY — DX: Impacted cerumen, bilateral: H61.23

## 2020-05-20 NOTE — Telephone Encounter (Signed)
I called patient back about her medicare wellness exam & she states what she was wanting to know is can she switch to another doctor in the office to be her PCP because [employer] keeps telling her that Dr. 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  Patient can be reached at number in previous note.  MS LPN

## 2020-05-24 NOTE — Telephone Encounter (Addendum)
I have called pt to explain that insurance would not give me information last week. Pt provided me a different phone # [phone], Option 1 for Medicare, ask for Alexis. I called and spoke with Caryn Bee. He was able to message Jon Gills and she is going to call me back in regard to the request for information on Dr. Luana Shu.

## 2020-05-25 NOTE — Telephone Encounter (Signed)
Late entry from 8/2 4:00pm Alexxes from [facility] called back. She notes that Dr. Barron Alvine is in their system as a whole as being a specialist in [place]. She will all back tomorrow to obtain further information.

## 2020-05-25 NOTE — Telephone Encounter (Signed)
Alexxes with [facility] Medicare called and the phone # she was provided for [employer] Team is not working. She is trying to get updated info and will call back again.

## 2020-05-26 LAB — HM MAMMOGRAPHY

## 2020-05-27 NOTE — Telephone Encounter (Signed)
Called [facility] Network Mgmt Team at [phone] and waiting for call back to update info for Dr. Barron Alvine

## 2020-08-05 NOTE — Telephone Encounter (Signed)
Called [facility] Network Mgmt Team Phone # [phone] Spoke with Felipa Eth 9:29am 08/05/2020 Call Ref # [id number]  Felipa Eth was able to determine Dr. Luana Shu is listed as Family Medicine and PCP status however under the detailed information housed in their system she was setup as a specialist. Felipa Eth has sent an email to the Enrollment Team to have this corrected. Estimated time to correct is 30 days.  Call patient and left her a message to advise of above.

## 2020-09-03 NOTE — Telephone Encounter (Signed)
Called Kim Grant at [facility] Network Mgmt Team Ph # [phone]  Kim Grant noted that ticket was submitted 10/14 but not sent to Enrollment Team until 11/2. They request 30 days to complete the ticket. As of now the status is not corrected. Notified pt of this and advised I will check again around 09/23/20.

## 2020-09-10 ENCOUNTER — Other Ambulatory Visit: Payer: Medicare Other

## 2020-09-30 DIAGNOSIS — M1711 Unilateral primary osteoarthritis, right knee: Secondary | ICD-10-CM | POA: Diagnosis not present

## 2020-10-19 DIAGNOSIS — M1710 Unilateral primary osteoarthritis, unspecified knee: Secondary | ICD-10-CM

## 2020-10-19 HISTORY — DX: Unilateral primary osteoarthritis, unspecified knee: M17.10

## 2020-10-26 NOTE — Telephone Encounter (Signed)
I have notified pt that I received an email 10/25/2020 from [facility] Provider updates noting that their file has been updated and Dr. Luana Shu should now reflect as a PCP and no longer be tied to a specialty. Pt is scheduled for appt 11/18/20 for surgical consult/release. She will call [employer] to update PCP.

## 2020-10-28 ENCOUNTER — Emergency Department (HOSPITAL_BASED_OUTPATIENT_CLINIC_OR_DEPARTMENT_OTHER)
Admission: EM | Admit: 2020-10-28 | Discharge: 2020-10-28 | Disposition: A | Discharge status: Home / Self Care | Payer: Medicare Other | Attending: Emergency Medicine | Admitting: Emergency Medicine

## 2020-10-28 ENCOUNTER — Emergency Department (HOSPITAL_BASED_OUTPATIENT_CLINIC_OR_DEPARTMENT_OTHER): Payer: Medicare Other

## 2020-10-28 ENCOUNTER — Encounter (HOSPITAL_BASED_OUTPATIENT_CLINIC_OR_DEPARTMENT_OTHER): Payer: Self-pay | Admitting: *Deleted

## 2020-10-28 ENCOUNTER — Other Ambulatory Visit: Payer: Self-pay

## 2020-10-28 DIAGNOSIS — R059 Cough, unspecified: Secondary | ICD-10-CM | POA: Diagnosis not present

## 2020-10-28 DIAGNOSIS — R519 Headache, unspecified: Secondary | ICD-10-CM | POA: Diagnosis not present

## 2020-10-28 DIAGNOSIS — B349 Viral infection, unspecified: Secondary | ICD-10-CM

## 2020-10-28 DIAGNOSIS — Z20822 Contact with and (suspected) exposure to covid-19: Secondary | ICD-10-CM | POA: Diagnosis not present

## 2020-10-28 DIAGNOSIS — Z87891 Personal history of nicotine dependence: Secondary | ICD-10-CM | POA: Insufficient documentation

## 2020-10-28 LAB — RESP PANEL BY RT-PCR (RSV, FLU A&B, COVID)  RVPGX2
Influenza A by PCR: NEGATIVE
Influenza B by PCR: NEGATIVE
Resp Syncytial Virus by PCR: NEGATIVE
SARS Coronavirus 2 by RT PCR: NEGATIVE

## 2020-10-28 LAB — GROUP A STREP BY PCR: Group A Strep by PCR: NOT DETECTED

## 2020-10-28 MED ORDER — AZITHROMYCIN 250 MG PO TABS: 250.0000 mg | ORAL_TABLET | Freq: Every day | ORAL | 0 refills | Status: DC

## 2020-10-28 NOTE — ED Provider Notes (Signed)
[facility] EMERGENCY DEPARTMENT Provider Note   CSN: [id number] Arrival date & time: 10/28/20  1611     History Chief Complaint  Patient presents with  . URI    Kim Grant is a 80 y.o. female.  HPI   Patient with significant medical history of aortic stenosis presents to the emergency department with chief complaint of URI-like symptoms. Patient states symptoms started on Monday, endorses a sore throat that turned into laryngitis, a slight productive cough and felt slightly fatigued. Patient states her throat is starting to feel better but still continues to feel scratchy, she does endorse some nasal congestion and thinks she may has a postnasal drip. She denies fevers, chills, headaches, chest pain, shortness of breath, abdominal pain, nausea, vomiting, diarrhea, urinary symptom is tolerating p.o. with difficulty. Patient endorses that she has vaccine against Covid and influenza, denies recent sick contacts, is not immunocompromise. She denies any alleviating factors at this time.  Past Medical History:  Diagnosis Date  . Depression   . Hyperlipemia   . Osteoporosis     Patient Active Problem List   Diagnosis Date Noted  . Barrett's esophagus without dysplasia 11/25/2018  . 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HCC) 05/25/2017  . Age-related osteoporosis without current pathological fracture 08/04/2014  . Chronic bronchitis (HCC) 08/04/2014  . Pure hypercholesterolemia 08/04/2014  . Aortic stenosis 08/04/2014    Past Surgical History:  Procedure Laterality Date  . ABDOMINAL HYSTERECTOMY    . BREAST SURGERY    . ROTATOR CUFF REPAIR       OB History   No obstetric history on file.     Family History  Problem Relation Age of Onset  . Cancer Mother        ovarian   . Heart disease Father     Social History   Tobacco Use  . Smoking status: Former Smoker    Quit date: 02/28/2013    Years since quitting: 7.6  . Smokeless tobacco: Never Used   Substance Use Topics  . Alcohol use: No  . Drug use: No    Home Medications Prior to Admission medications   Medication Sig Start Date End Date Taking? Authorizing Provider  calcium citrate-vitamin D (CITRACAL+D) 315-200 MG-UNIT per tablet Take 3 tablets by mouth daily.   Yes [provider]  citalopram (CELEXA) 40 MG tablet TAKE 1 TABLET BY MOUTH EVERY DAY 12/18/19  Yes Cirigliano, Mary K, DO  famotidine (PEPCID) 20 MG tablet Take by mouth. 01/22/17 10/28/20 Yes [provider]  ibuprofen (ADVIL,MOTRIN) 200 MG tablet Take 200 mg by mouth every 6 (six) hours as needed for mild pain or moderate pain.   Yes [provider]  meclizine (ANTIVERT) 25 MG tablet Take 1 tablet (25 mg total) by mouth 3 (three) times daily as needed for dizziness. 04/04/14  Yes Delo, [name] Lam, MD  Multiple Vitamin (MULTIVITAMIN WITH MINERALS) TABS tablet Take 1 tablet by mouth daily.   Yes [provider]  pravastatin (PRAVACHOL) 40 MG tablet TAKE 1 TABLET BY MOUTH DAILY 12/18/19  Yes Cirigliano, Mary K, DO  azithromycin (ZITHROMAX) 250 MG tablet Take 1 tablet (250 mg total) by mouth daily. Take first 2 tablets together, then 1 every day until finished. 10/28/20   Carroll Sage, PA-C  triamcinolone cream (KENALOG) 0.1 % Apply topically. 12/28/16   [provider]    Allergies    Patient has no known allergies.  Review of Systems   Review of Systems  Constitutional:  Negative for chills and fever.  HENT: Positive for congestion, postnasal drip and sore throat.   Respiratory: Positive for cough. Negative for shortness of breath.   Cardiovascular: Negative for chest pain.  Gastrointestinal: Negative for abdominal pain, diarrhea, nausea and vomiting.  Genitourinary: Negative for enuresis.  Musculoskeletal: Negative for back pain.  Skin: Negative for rash.  Neurological: Negative for headaches.  Hematological: Does not bruise/bleed easily.    Physical Exam Updated Vital  Signs BP 138/69 (BP Location: Right Arm)   Pulse 71   Temp 98 F (36.7 C) (Oral)   Resp 20   Ht 5\' 5"  (1.651 m)   Wt 75.8 kg   SpO2 97%   BMI 27.79 kg/m   Physical Exam Vitals and nursing note reviewed.  Constitutional:      General: She is not in acute distress.    Appearance: She is not ill-appearing.  HENT:     Head: Normocephalic and atraumatic.     Right Ear: Tympanic membrane, ear canal and external ear normal.     Left Ear: Tympanic membrane, ear canal and external ear normal.     Nose: Congestion present.     Comments: Patient has nasal congestion present, erythematous turbinates bilaterally    Mouth/Throat:     Mouth: Mucous membranes are moist.     Pharynx: Oropharynx is clear. No oropharyngeal exudate or posterior oropharyngeal erythema.  Eyes:     Conjunctiva/sclera: Conjunctivae normal.  Cardiovascular:     Rate and Rhythm: Normal rate and regular rhythm.     Pulses: Normal pulses.     Heart sounds: Murmur heard.  No friction rub. No gallop.      Comments: Systolic murmur present Pulmonary:     Effort: No respiratory distress.     Breath sounds: No wheezing, rhonchi or rales.  Abdominal:     Palpations: Abdomen is soft.     Tenderness: There is no abdominal tenderness.  Musculoskeletal:     Right lower leg: No edema.     Left lower leg: No edema.  Skin:    General: Skin is warm and dry.  Neurological:     Mental Status: She is alert.  Psychiatric:        Mood and Affect: Mood normal.     ED Results / Procedures / Treatments   Labs (all labs ordered are listed, but only abnormal results are displayed) Labs Reviewed  GROUP A STREP BY PCR  RESP PANEL BY RT-PCR (RSV, FLU A&B, COVID)  [id number]    EKG None  Radiology DG Chest Port 1 View  Result Date: 10/28/2020 CLINICAL DATA:  Nasal congestion, cough, headache for 3 days EXAM: PORTABLE CHEST 1 VIEW COMPARISON:  01/03/2020 FINDINGS: Single frontal view of the chest demonstrates an unremarkable  cardiac silhouette. No airspace disease, effusion, or pneumothorax. No acute bony abnormalities. Chronic posttraumatic changes of the right shoulder are stable. IMPRESSION: 1. No acute intrathoracic process. Electronically Signed   By: 01/05/2020 M.D.   On: 10/28/2020 19:06    Procedures Procedures (including critical care time)  Medications Ordered in ED Medications - No data to display  ED Course  I have reviewed the triage vital signs and the nursing notes.  Pertinent labs & imaging results that were available during my care of the patient were reviewed by me and considered in my medical decision making (see chart for details).    MDM Rules/Calculators/A&P  Patient presents with URI-like symptoms. She is alert, does not appear acute distress, vital signs reassuring. Will obtain chest x-ray, respiratory panel, strep test.  Chest x-ray does not reveal any acute findings.  Respiratory panel pending at this time.  Strep test is negative  Low suspicion for systemic infection as patient is nontoxic-appearing, vital signs reassuring, no obvious source infection noted on exam.  Low suspicion for pneumonia as lung sounds are clear bilaterally, x-ray did not reveal any acute findings.  I have low suspicion for PE as patient denies pleuritic chest pain, shortness of breath, vital signs are reassuring.  low suspicion for strep throat as oropharynx was visualized, no erythema or exudates noted, strep test is negative.  Low suspicion patient would need  hospitalized due to viral infection or Covid as vital signs reassuring, patient is not in respiratory distress.  I suspect patient is having from a viral URI will recommend over-the-counter pain medications and follow-up with PCP in 1 week's time if Covid negative and symptoms persist.  If Covid positive will recommend she follows up with "post Covid care" for further evaluation.  Vital signs have remained stable, no  indication for hospital admission.  Patient discussed with attending and they agreed with assessment and plan.  Patient given at home care as well strict return precautions.  Patient verbalized that they understood agreed to said plan.      Final Clinical Impression(s) / ED Diagnoses Final diagnoses:  Viral syndrome    Rx / DC Orders ED Discharge Orders         Ordered    azithromycin (ZITHROMAX) 250 MG tablet  Daily,   Status:  Discontinued        10/28/20 2002    azithromycin (ZITHROMAX) 250 MG tablet  Daily        10/28/20 2007           Aron Baba 10/28/20 2010    Charlesetta Shanks, MD 10/29/20 2158

## 2020-10-28 NOTE — Discharge Instructions (Addendum)
You have been seen here for URI like symptoms.  I recommend taking Tylenol for fever control and ibuprofen for pain control please follow dosing on the back of bottle.  I recommend staying hydrated and if you do not an appetite, I recommend soups as this will provide you with fluids and calories.  Your Covid test is pending I recommend self quarantine until you get your results back on MyChart.    If you are Covid positive you must self quarantine for 5 days starting on symptom onset, if at the end of those 5 days you are feeling better you may return back to school/work, if you continue to have symptoms you must self quarantine for additional 5 days.  I would like you to contact "post Covid care" as they will provide you with information how to manage your Covid symptoms if you are Covid positive.     if you are your flu and covid are negative I prescribed you a Z-Pak please take as prescribed and follow-up with your primary care provider  Come back to the emergency department if you develop chest pain, shortness of breath, severe abdominal pain, uncontrolled nausea, vomiting, diarrhea.

## 2020-10-28 NOTE — ED Provider Notes (Signed)
Medical screening examination/treatment/procedure(s) were conducted as a shared visit with non-physician practitioner(s) and myself.  I personally evaluated the patient during the encounter.       Arby Barrette, MD 10/28/20 2001

## 2020-10-28 NOTE — ED Triage Notes (Signed)
Nasal congestion and headache x 3 days. No Covid exposure.

## 2020-11-18 ENCOUNTER — Ambulatory Visit (INDEPENDENT_AMBULATORY_CARE_PROVIDER_SITE_OTHER): Payer: Medicare Other | Admitting: Family Medicine

## 2020-11-18 ENCOUNTER — Other Ambulatory Visit: Payer: Self-pay

## 2020-11-18 ENCOUNTER — Encounter: Payer: Self-pay | Admitting: Family Medicine

## 2020-11-18 VITALS — BP 124/76 | HR 83 | Temp 97.2°F | Ht 65.0 in | Wt 166.8 lb

## 2020-11-18 DIAGNOSIS — E782 Mixed hyperlipidemia: Secondary | ICD-10-CM | POA: Diagnosis not present

## 2020-11-18 DIAGNOSIS — M1711 Unilateral primary osteoarthritis, right knee: Secondary | ICD-10-CM

## 2020-11-18 DIAGNOSIS — I350 Nonrheumatic aortic (valve) stenosis: Secondary | ICD-10-CM

## 2020-11-18 DIAGNOSIS — Z01818 Encounter for other preprocedural examination: Secondary | ICD-10-CM

## 2020-11-18 DIAGNOSIS — F330 Major depressive disorder, recurrent, mild: Secondary | ICD-10-CM

## 2020-11-18 MED ORDER — PRAVASTATIN SODIUM 40 MG PO TABS: 40.0000 mg | ORAL_TABLET | Freq: Every day | ORAL | 3 refills | Status: DC

## 2020-11-18 MED ORDER — CITALOPRAM HYDROBROMIDE 40 MG PO TABS: 40.0000 mg | ORAL_TABLET | Freq: Every day | ORAL | 3 refills | Status: DC

## 2020-11-18 NOTE — Addendum Note (Signed)
Addended by: Varney Biles on: 11/18/2020 03:26 PM   Modules accepted: Orders

## 2020-11-18 NOTE — Progress Notes (Signed)
Kim Grant is a 80 y.o. female  Chief Complaint  Patient presents with  . Pre-op Exam    Surgery clearance for RT knee on 12/13/20.    HPI: Kim Grant is a 80 y.o. female seen today for preoperative evaluation and examination.  Planned Procedure: Rt TKA  Surgeon: Dr. Delena Serve   Date of Surgery: 12/13/2020  Recent illness/hospitalization: viral bronchitis dx on 10/29/19. She feels much better and symptoms resolved.  Previous issue with anesthesia: no  Personal/Family history of clotting disorder: no   Last Dexa: 04/2020  Last echo: 06/2020 - pt follows with cardio at [facility] - last seen by Dorris Carnes, NP in 05/2020 SUMMARY The left ventricular size is normal. Mild left ventricular hypertrophy Left ventricular systolic function is normal. LV ejection fraction = 55-60%. Left ventricular filling pattern is prolonged relaxation. The left ventricular wall motion is normal. The right ventricle is normal size. The right ventricular systolic function is normal. The left atrium is mildly dilated. There is mild aortic stenosis. There is no aortic regurgitation. There is mild mitral annular calcification. No pulmonary hypertension. The IVC is normal in size with an inspiratory collapse of greater then 50%, suggesting normal right atrial pressure. There is no pericardial effusion. There is no comparison study available.   Past Medical History:  Diagnosis Date  . Depression   . Hyperlipemia   . Osteoporosis     Past Surgical History:  Procedure Laterality Date  . ABDOMINAL HYSTERECTOMY    . BREAST SURGERY    . ROTATOR CUFF REPAIR      Social History   Socioeconomic History  . Marital status: Widowed    Spouse name: Not on file  . Number of children: Not on file  . Years of education: Not on file  . Highest education level: Not on file  Occupational History  . Not on file  Tobacco Use  . Smoking status: Former Smoker    Quit date: 02/28/2013    Years since  quitting: 7.7  . Smokeless tobacco: Never Used  Substance and Sexual Activity  . Alcohol use: No  . Drug use: No  . Sexual activity: Never  Other Topics Concern  . 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 She is widowed, has 2 grown sons. 2 grand daughters, 1 great granddaughter who is 1yo.    Social Determinants of Health   Financial Resource Strain: Not on file  Food Insecurity: Not on file  Transportation Needs: Not on file  Physical Activity: Not on file  Stress: Not on file  Social Connections: Not on file  Intimate Partner Violence: Not on file    Family History  Problem Relation Age of Onset  . Cancer Mother        ovarian   . Heart disease Father      Immunization History  Administered Date(s) Administered  . Influenza, High Dose Seasonal PF 07/10/2016, 07/05/2018  . Influenza-Unspecified 08/27/2007, 09/09/2010, 07/23/2015, 07/10/2016  . PFIZER(Purple Top)SARS-COV-2 Vaccination 12/01/2019, 12/23/2019  . Pneumococcal Conjugate-13 08/28/2017  . Pneumococcal Polysaccharide-23 09/19/2010  . Td 02/09/2007  . Tdap 03/20/2019    Outpatient Encounter Medications as of 11/18/2020  Medication Sig  . calcium citrate-vitamin D (CITRACAL+D) 315-200 MG-UNIT per tablet Take 3 tablets by mouth daily.  . citalopram (CELEXA) 40 MG tablet TAKE 1 TABLET BY MOUTH EVERY DAY  . famotidine (PEPCID) 20 MG tablet Take by mouth.  Marland Kitchen  ibuprofen (ADVIL,MOTRIN) 200 MG tablet Take 200 mg by mouth every 6 (six) hours as needed for mild pain or moderate pain.  . meclizine (ANTIVERT) 25 MG tablet Take 1 tablet (25 mg total) by mouth 3 (three) times daily as needed for dizziness.  . Multiple Vitamin (MULTIVITAMIN WITH MINERALS) TABS tablet Take 1 tablet by mouth daily.  . pravastatin (PRAVACHOL) 40 MG tablet TAKE 1 TABLET BY MOUTH DAILY  . azithromycin (ZITHROMAX) 250 MG tablet Take 1 tablet (250 mg total) by mouth daily. Take first 2  tablets together, then 1 every day until finished. (Patient not taking: Reported on 11/18/2020)  . triamcinolone cream (KENALOG) 0.1 % Apply topically. (Patient not taking: Reported on 11/18/2020)   No facility-administered encounter medications on file as of 11/18/2020.     ROS:  Gen: no fever, chills ENT: no ear pain, ear drainage, nasal congestion, rhinorrhea, sinus pressure, sore throat Eyes: no blurry vision, double vision Resp: no cough, wheeze,SOB CV: no CP, palpitations, LE edema, PND GI: no heartburn, n/v/d/c, abd pain GU: no dysuria, urgency, frequency, hematuria  MSK: as per HPI - Rt knee pain Neuro: no dizziness, headache, weakness Psych: no depression, anxiety, insomnia   No Known Allergies  BP 124/76   Pulse 83   Temp (!) 97.2 F (36.2 C) (Temporal)   Ht 5\' 5"  (1.651 m)   Wt 166 lb 12.8 oz (75.7 kg)   SpO2 96%   BMI 27.76 kg/m    Wt Readings from Last 3 Encounters:  11/18/20 166 lb 12.8 oz (75.7 kg)  10/28/20 167 lb (75.8 kg)  03/20/19 169 lb (76.7 kg)   Temp Readings from Last 3 Encounters:  11/18/20 (!) 97.2 F (36.2 C) (Temporal)  10/28/20 98 F (36.7 C)  03/20/19 98.1 F (36.7 C) (Oral)   BP Readings from Last 3 Encounters:  11/18/20 124/76  10/28/20 138/69  03/20/19 126/78   Pulse Readings from Last 3 Encounters:  11/18/20 83  10/28/20 70  03/20/19 65    Physical Exam HENT:     Mouth/Throat:     Mouth: Oropharynx is clear and moist. Mucous membranes are moist.     Pharynx: Oropharynx is clear. No oropharyngeal exudate.  Eyes:     General:        Right eye: No discharge.        Left eye: No discharge.     Conjunctiva/sclera: Conjunctivae normal.  Neck:     Thyroid: No thyromegaly.     Vascular: No carotid bruit or JVD.  Cardiovascular:     Rate and Rhythm: Normal rate and regular rhythm.     Pulses: Normal pulses.  Pulmonary:     Effort: Pulmonary effort is normal.     Breath sounds: Normal breath sounds. No wheezing or  rhonchi.  Abdominal:     General: Bowel sounds are normal. There is no distension.     Palpations: Abdomen is soft.     Tenderness: There is no abdominal tenderness.  Musculoskeletal:        General: No edema.     Cervical back: Neck supple.     Right lower leg: No edema.     Left lower leg: No edema.  Lymphadenopathy:     Cervical: No cervical adenopathy.  Skin:    General: Skin is warm and dry.     Findings: No erythema or rash.  Neurological:     Mental Status: She is alert and oriented to person, place, and time.  Gait: Gait is intact.     Deep Tendon Reflexes: Reflexes normal.  Psychiatric:        Mood and Affect: Mood and affect normal.        Behavior: Behavior normal.        Cognition and Memory: Memory normal.      Lab Results  Component Value Date   CHOL 269 (H) 03/20/2019   HDL 72.50 03/20/2019   LDLCALC 161 (H) 03/20/2019   TRIG 181.0 (H) 03/20/2019   CHOLHDL 4 03/20/2019   Lab Results  Component Value Date   WBC 5.9 03/20/2019   HGB 11.8 (L) 03/20/2019   HCT 34.7 (L) 03/20/2019   MCV 83.1 03/20/2019   PLT 293.0 03/20/2019    Basic metabolic panel Specimen:  Blood  Ref Range & Units 6 mo ago Comments  Sodium 135 - 146 MMOL/L 136    Potassium 3.5 - 5.3 MMOL/L 5.0    Chloride 98 - 110 MMOL/L 101    CO2 23 - 30 MMOL/L 27    BUN 8 - 24 MG/DL 24    Glucose 70 - 99 MG/DL 79  Patients taking eltrombopag at doses >/= 100 mg daily may show falsely elevated values of 10% or greater.  Creatinine 0.50 - 1.50 MG/DL 4.09    Calcium 8.5 - 81.1 MG/DL 9.0    Anion Gap 4 - 14 MMOL/L 7    Est. GFR Non-African American >=60 ML/MIN/1.73 M*2  51Low  GFR estimated by CKD-EPI equations, reportable up to 90 ML/MIN/1.73 M*2  Resulting Agency  [facility]   Specimen Collected: 04/27/20 2:47 PM Last Resulted: 04/27/20 4:27 PM  Received From: [facility]  Result Received: 08/05/20 9:19 AM  View Encounter    25(OH) Vitamin D Total Specimen:  Blood  Ref Range & Units 5 mo ago Comments  Total Vitamin D 25-Hydroxy >=30 ng/mL 39  Total Vitamin D includes D2 and D3. D3 can originate from both endogenous and supplemental sources, whereas D2 is not endogenously produced.  Therapy is based on Total 25(OH) Vitamin D with concentrations <20 ng/mL being indicative of Vitamin D deficiency. Optimal concentrations should be >30 ng/mL.  Resulting Agency  [facility]   Specimen Collected: 05/26/20 2:04 PM Last Resulted: 05/26/20 7:40 PM  Received From: [facility]  Result Received: 08/05/20 9:19 AM  View Encounter     A/P:  1. Nonrheumatic aortic valve stenosis - last saw cardio in 05/2020 - echo in 06/2020 - mild AS, normal EF  2. Mixed hyperlipidemia  - on pravachol 40mg  daily, continue this - Lipid panel  3. Preop examination 4. Primary osteoarthritis of right knee - pt is acceptable risk for planned surgical procedure - form completed and will fax to [phone] Dr. [phone] office - CBC - BMP done 42mo ago and WNL  5. Mild episode of recurrent major depressive disorder (HCC) - stable, well-controlled Refill: - citalopram (CELEXA) 40 MG tablet; Take 1 tablet (40 mg total) by mouth daily.  Dispense: 90 tablet; Refill: 3    This visit occurred during the SARS-CoV-2 public health emergency.  Safety protocols were in place, including screening questions prior to the visit, additional usage of staff PPE, and extensive cleaning of exam room while observing appropriate contact time as indicated for disinfecting solutions.

## 2020-11-29 DIAGNOSIS — M1711 Unilateral primary osteoarthritis, right knee: Secondary | ICD-10-CM | POA: Diagnosis not present

## 2020-11-29 DIAGNOSIS — M1710 Unilateral primary osteoarthritis, unspecified knee: Secondary | ICD-10-CM | POA: Diagnosis not present

## 2020-11-29 DIAGNOSIS — Z01818 Encounter for other preprocedural examination: Secondary | ICD-10-CM | POA: Diagnosis not present

## 2020-12-01 ENCOUNTER — Telehealth: Payer: Self-pay | Admitting: Family Medicine

## 2020-12-01 DIAGNOSIS — D649 Anemia, unspecified: Secondary | ICD-10-CM

## 2020-12-01 NOTE — Telephone Encounter (Signed)
Caller Name: Kim Grant Call back phone #: [phone]  Reason for Call: Pt called stating Dr Leonel Ramsay, surgeon, advised that her hemoglobin is off. Labs were done 2/7 at [facility] and in care everywhere. Pt said she was advised to call PCP and ask for advice.

## 2020-12-01 NOTE — Telephone Encounter (Signed)
Labs from 11/29/20 reviewed. Pts Hgb = 10.6 and Hct 32.0, MCV 82.7. Pts baseline Hgb 11.5-11.8 (most recently 11.8 in 02/2019) and Hct 33.8-34.7. Pt should have iron panel done and she can come here for lab appt for that. Does not need to be fasting. Is she having any rectal bleeding, blood w/ BM or very dark, tarry stools?

## 2020-12-01 NOTE — Telephone Encounter (Signed)
Please advise on message below. Thanks. Dm/cma  c

## 2020-12-02 ENCOUNTER — Other Ambulatory Visit: Payer: Self-pay

## 2020-12-02 ENCOUNTER — Other Ambulatory Visit: Payer: Medicare Other

## 2020-12-02 DIAGNOSIS — D649 Anemia, unspecified: Secondary | ICD-10-CM | POA: Diagnosis not present

## 2020-12-02 NOTE — Telephone Encounter (Signed)
Patient notified VIA phone and scheduled for lab today. Dm/cma

## 2020-12-02 NOTE — Telephone Encounter (Signed)
No rectal bleeding or any issues.  Dm/cma

## 2020-12-03 ENCOUNTER — Telehealth: Payer: Self-pay | Admitting: *Deleted

## 2020-12-03 ENCOUNTER — Other Ambulatory Visit: Payer: Self-pay | Admitting: Family Medicine

## 2020-12-03 ENCOUNTER — Other Ambulatory Visit: Payer: Self-pay | Admitting: Family

## 2020-12-03 DIAGNOSIS — D649 Anemia, unspecified: Secondary | ICD-10-CM

## 2020-12-03 LAB — IRON,TIBC AND FERRITIN PANEL
%SAT: 21 % (calc) (ref 16–45)
Ferritin: 43 ng/mL (ref 16–288)
Iron: 75 ug/dL (ref 45–160)
TIBC: 353 mcg/dL (calc) (ref 250–450)

## 2020-12-03 NOTE — Telephone Encounter (Signed)
Called patient - per referra l2/11/22  - gave her upcoming appts.

## 2020-12-06 ENCOUNTER — Other Ambulatory Visit: Payer: Self-pay

## 2020-12-06 ENCOUNTER — Inpatient Hospital Stay: Payer: Medicare Other | Admitting: Family

## 2020-12-06 ENCOUNTER — Inpatient Hospital Stay: Payer: Medicare Other | Attending: Hematology & Oncology

## 2020-12-06 ENCOUNTER — Encounter: Payer: Self-pay | Admitting: Family

## 2020-12-06 VITALS — BP 153/78 | HR 69 | Temp 98.0°F | Resp 17 | Ht 65.0 in | Wt 168.0 lb

## 2020-12-06 DIAGNOSIS — Z8041 Family history of malignant neoplasm of ovary: Secondary | ICD-10-CM | POA: Diagnosis not present

## 2020-12-06 DIAGNOSIS — F32A Depression, unspecified: Secondary | ICD-10-CM | POA: Diagnosis not present

## 2020-12-06 DIAGNOSIS — E785 Hyperlipidemia, unspecified: Secondary | ICD-10-CM | POA: Diagnosis not present

## 2020-12-06 DIAGNOSIS — Z8249 Family history of ischemic heart disease and other diseases of the circulatory system: Secondary | ICD-10-CM | POA: Insufficient documentation

## 2020-12-06 DIAGNOSIS — Z79899 Other long term (current) drug therapy: Secondary | ICD-10-CM | POA: Diagnosis not present

## 2020-12-06 DIAGNOSIS — D631 Anemia in chronic kidney disease: Secondary | ICD-10-CM

## 2020-12-06 DIAGNOSIS — M810 Age-related osteoporosis without current pathological fracture: Secondary | ICD-10-CM | POA: Diagnosis not present

## 2020-12-06 DIAGNOSIS — D509 Iron deficiency anemia, unspecified: Secondary | ICD-10-CM | POA: Diagnosis not present

## 2020-12-06 DIAGNOSIS — D649 Anemia, unspecified: Secondary | ICD-10-CM | POA: Diagnosis not present

## 2020-12-06 DIAGNOSIS — Z90710 Acquired absence of both cervix and uterus: Secondary | ICD-10-CM | POA: Diagnosis not present

## 2020-12-06 DIAGNOSIS — Z87891 Personal history of nicotine dependence: Secondary | ICD-10-CM | POA: Insufficient documentation

## 2020-12-06 LAB — CMP (CANCER CENTER ONLY)
ALT: 17 U/L (ref 0–44)
AST: 23 U/L (ref 15–41)
Albumin: 4.4 g/dL (ref 3.5–5.0)
Alkaline Phosphatase: 84 U/L (ref 38–126)
Anion gap: 8 (ref 5–15)
BUN: 26 mg/dL — ABNORMAL HIGH (ref 8–23)
CO2: 29 mmol/L (ref 22–32)
Calcium: 10.4 mg/dL — ABNORMAL HIGH (ref 8.9–10.3)
Chloride: 99 mmol/L (ref 98–111)
Creatinine: 1.09 mg/dL — ABNORMAL HIGH (ref 0.44–1.00)
GFR, Estimated: 52 mL/min — ABNORMAL LOW (ref >60)
Glucose, Bld: 91 mg/dL (ref 70–99)
Potassium: 4.4 mmol/L (ref 3.5–5.1)
Sodium: 136 mmol/L (ref 135–145)
Total Bilirubin: 0.3 mg/dL (ref 0.3–1.2)
Total Protein: 6.7 g/dL (ref 6.5–8.1)

## 2020-12-06 LAB — CBC WITH DIFFERENTIAL (CANCER CENTER ONLY)
Abs Immature Granulocytes: 0.06 10*3/uL (ref 0.00–0.07)
Basophils Absolute: 0.1 10*3/uL (ref 0.0–0.1)
Basophils Relative: 1 %
Eosinophils Absolute: 0.3 10*3/uL (ref 0.0–0.5)
Eosinophils Relative: 5 %
HCT: 32.4 % — ABNORMAL LOW (ref 36.0–46.0)
Hemoglobin: 10.3 g/dL — ABNORMAL LOW (ref 12.0–15.0)
Immature Granulocytes: 1 %
Lymphocytes Relative: 25 %
Lymphs Abs: 1.5 10*3/uL (ref 0.7–4.0)
MCH: 27.5 pg (ref 26.0–34.0)
MCHC: 31.8 g/dL (ref 30.0–36.0)
MCV: 86.4 fL (ref 80.0–100.0)
Monocytes Absolute: 0.5 10*3/uL (ref 0.1–1.0)
Monocytes Relative: 8 %
Neutro Abs: 3.7 10*3/uL (ref 1.7–7.7)
Neutrophils Relative %: 60 %
Platelet Count: 283 10*3/uL (ref 150–400)
RBC: 3.75 MIL/uL — ABNORMAL LOW (ref 3.87–5.11)
RDW: 13.5 % (ref 11.5–15.5)
WBC Count: 6.1 10*3/uL (ref 4.0–10.5)
nRBC: 0 % (ref 0.0–0.2)

## 2020-12-06 LAB — VITAMIN B12: Vitamin B-12: 751 pg/mL (ref 180–914)

## 2020-12-06 LAB — RETICULOCYTES
Immature Retic Fract: 3 % (ref 2.3–15.9)
RBC.: 3.69 MIL/uL — ABNORMAL LOW (ref 3.87–5.11)
Retic Count, Absolute: 33.9 10*3/uL (ref 19.0–186.0)
Retic Ct Pct: 0.9 % (ref 0.4–3.1)

## 2020-12-06 LAB — FOLATE: Folate: 40.3 ng/mL (ref >5.9)

## 2020-12-06 LAB — SAVE SMEAR(SSMR), FOR PROVIDER SLIDE REVIEW

## 2020-12-06 LAB — LACTATE DEHYDROGENASE: LDH: 163 U/L (ref 98–192)

## 2020-12-06 NOTE — Progress Notes (Signed)
Hematology/Oncology Consultation   Name: Kim Grant      MRN: [id number]    Location: Room/bed info not found  Date: 12/06/2020 Time:2:40 PM   REFERRING PHYSICIAN: Luana Shu, DO  REASON FOR CONSULT: Anemia unspecified   DIAGNOSIS: Anemia unspecified, lab work up pending   HISTORY OF PRESENT ILLNESS: Kim Grant is a very pleasant 80 yo caucasian female with mild anemia. Looking back at lab work over this last 6 years or so she has had mild anemia (11.5-11.8). Her most recent lab work revealed Hgb of 10.4. She was scheduled to have a  right knee replacement this month but her surgeon has put this on hold due to her anemia.  Iron studies last week showed a saturation of 21% and ferritin 43. Hgb today is 10.3, MCV 86. She has not noted any blood loss. No petechiae.  She notes that she bruises easily.  She has GERD with history of Barrett's esophagus and takes Pepcid daily.  She states that her most recent EGD and colonoscopy were 2-3 years ago. She states that is when she was diagnosed with GERD/Barrett's esophagus and also had some benign polyps removed.  She states that she had a total hysterectomy due to her mother being diagnosed with ovarian cancer at 79 yo.   No personal history of cancer.  She has 2 sons and no history of miscarriage.  She fell walking her sisters dog in 2019 and had to have rotator cuff repair surgery.  She states that she tripped [place] the gas pump 3 weeks ago but thankfully was not injured.  No syncope. No fever, chills, n/v, cough, rash, SOB, chest pain, palpitations, abdominal pain or changes in bowel or bladder habits.  She has occasional episodes of dizziness secondary to vertigo and takes Meclizine when needed.  She has occasional puffiness in the left ankle.  No numbness or tingling in her extremities.  No history of diabetes or thyroid disease.  She has maintained a good appetite and is staying well hydrated. Her weight is stable at 168 lbs.  She quit smoking  5 years ago. She had been smoking < 1 ppd at that time.  No recreational drug use. She has a rare alcoholic beverage socially.  She has maintained a good appetite and is staying well hydrated.  She is quite active and enjoys chair yoga, aerobics and memory/balance classes art the [employer].   ROS: All other 10 point review of systems is negative.   PAST MEDICAL HISTORY:   Past Medical History:  Diagnosis Date  . Depression   . Hyperlipemia   . Osteoporosis     ALLERGIES: No Known Allergies    MEDICATIONS:  Current Outpatient Medications on File Prior to Visit  Medication Sig Dispense Refill  . calcium citrate-vitamin D (CITRACAL+D) 315-200 MG-UNIT per tablet Take 3 tablets by mouth daily.    . citalopram (CELEXA) 40 MG tablet Take 1 tablet (40 mg total) by mouth daily. 90 tablet 3  . ibuprofen (ADVIL,MOTRIN) 200 MG tablet Take 200 mg by mouth every 6 (six) hours as needed for mild pain or moderate pain.    . meclizine (ANTIVERT) 25 MG tablet Take 1 tablet (25 mg total) by mouth 3 (three) times daily as needed for dizziness. 15 tablet 0  . Multiple Vitamin (MULTIVITAMIN WITH MINERALS) TABS tablet Take 1 tablet by mouth daily.    . pravastatin (PRAVACHOL) 40 MG tablet Take 1 tablet (40 mg total) by mouth daily. 90 tablet 3  .  famotidine (PEPCID) 20 MG tablet Take by mouth.     No current facility-administered medications on file prior to visit.     PAST SURGICAL HISTORY Past Surgical History:  Procedure Laterality Date  . ABDOMINAL HYSTERECTOMY    . BREAST SURGERY    . ROTATOR CUFF REPAIR      FAMILY HISTORY: Family History  Problem Relation Age of Onset  . Cancer Mother        ovarian   . Heart disease Father     SOCIAL HISTORY:  reports that she quit smoking about 7 years ago. She has never used smokeless tobacco. She reports that she does not drink alcohol and does not use drugs.  PERFORMANCE STATUS: The patient's performance status is 1 - Symptomatic but completely  ambulatory  PHYSICAL EXAM: Most Recent Vital Signs: Blood pressure (!) 153/78, pulse 69, temperature 98 F (36.7 C), temperature source Oral, resp. rate 17, height 5\' 5"  (1.651 m), weight 168 lb (76.2 kg), SpO2 100 %. BP (!) 153/78 (BP Location: Left Arm, Patient Position: Sitting)   Pulse 69   Temp 98 F (36.7 C) (Oral)   Resp 17   Ht 5\' 5"  (1.651 m)   Wt 168 lb (76.2 kg)   SpO2 100%   BMI 27.96 kg/m   General Appearance:    Alert, cooperative, no distress, appears stated age  Head:    Normocephalic, without obvious abnormality, atraumatic  Eyes:    PERRL, conjunctiva/corneas clear, EOM's intact, fundi    benign, both eyes        Throat:   Lips, mucosa, and tongue normal; teeth and gums normal  Neck:   Supple, symmetrical, trachea midline, no adenopathy;    thyroid:  no enlargement/tenderness/nodules; no carotid   bruit or JVD  Back:     Symmetric, no curvature, ROM normal, no CVA tenderness  Lungs:     Clear to auscultation bilaterally, respirations unlabored  Chest Wall:    No tenderness or deformity   Heart:    Regular rate and rhythm, S1 and S2 normal, no murmur, rub   or gallop     Abdomen:     Soft, non-tender, bowel sounds active all four quadrants,    no masses, no organomegaly        Extremities:   Extremities normal, atraumatic, no cyanosis or edema  Pulses:   2+ and symmetric all extremities  Skin:   Skin color, texture, turgor normal, no rashes or lesions  Lymph nodes:   Cervical, supraclavicular, and axillary nodes normal  Neurologic:   CNII-XII intact, normal strength, sensation and reflexes    throughout    LABORATORY DATA:  Results for orders placed or performed in visit on 12/06/20 (from the past 48 hour(s))  CBC with Differential (Cancer Center Only)     Status: Abnormal   Collection Time: 12/06/20  2:06 PM  Result Value Ref Range   WBC Count 6.1 4.0 - 10.5 K/uL   RBC 3.75 (L) 3.87 - 5.11 MIL/uL   Hemoglobin 10.3 (L) 12.0 - 15.0 g/dL   HCT 12/08/20  (L) 12/08/20 - 46.0 %   MCV 86.4 80.0 - 100.0 fL   MCH 27.5 26.0 - 34.0 pg   MCHC 31.8 30.0 - 36.0 g/dL   RDW 63.8 17.7 - 11.6 %   Platelet Count 283 150 - 400 K/uL   nRBC 0.0 0.0 - 0.2 %   Neutrophils Relative % 60 %   Neutro Abs 3.7 1.7 - 7.7 K/uL  Lymphocytes Relative 25 %   Lymphs Abs 1.5 0.7 - 4.0 K/uL   Monocytes Relative 8 %   Monocytes Absolute 0.5 0.1 - 1.0 K/uL   Eosinophils Relative 5 %   Eosinophils Absolute 0.3 0.0 - 0.5 K/uL   Basophils Relative 1 %   Basophils Absolute 0.1 0.0 - 0.1 K/uL   Immature Granulocytes 1 %   Abs Immature Granulocytes 0.06 0.00 - 0.07 K/uL    Comment: Performed at [facility] at [facility], [address], [place], [place] [address]  Save Smear [facility])     Status: None   Collection Time: 12/06/20  2:06 PM  Result Value Ref Range   Smear Review SMEAR STAINED AND AVAILABLE FOR REVIEW     Comment: Performed at [facility] Lab at [facility], [address] [place], [place] [address]  Reticulocytes     Status: Abnormal   Collection Time: 12/06/20  2:07 PM  Result Value Ref Range   Retic Ct Pct 0.9 0.4 - 3.1 %   RBC. 3.69 (L) 3.87 - 5.11 MIL/uL   Retic Count, Absolute 33.9 19.0 - 186.0 K/uL   Immature Retic Fract 3.0 2.3 - 15.9 %    Comment: Performed at [facility] at [facility], [address], [place], [place] [address]      RADIOGRAPHY: No results found.     PATHOLOGY: None  ASSESSMENT/PLAN: Ms. Lyter is a very pleasant 80 yo caucasian female with anemia. Iron studies and B12/folate drawn today are stable.  Dr. Myna Hidalgo was able to review her blood smear. No abnormality or evidence of malignancy noted.  Erythropoietin level is pending. She may benefit from an ESA.  We will plan to see her again in 1 month.   All questions were answered and she is in agreement with the plan. She can contact our office with any questions or concerns. We can certainly see her  sooner if needed.   She was discussed with Dr. Myna Hidalgo and he is in agreement with the aforementioned.   [name], NP       Addendum: Erythropoietin level is low. We will proceed with ESA, Retacrit per her insurance.

## 2020-12-07 ENCOUNTER — Telehealth: Payer: Self-pay | Admitting: *Deleted

## 2020-12-07 LAB — ERYTHROPOIETIN: Erythropoietin: 8.2 m[IU]/mL (ref 2.6–18.5)

## 2020-12-07 LAB — IRON AND TIBC
Iron: 79 ug/dL (ref 41–142)
Saturation Ratios: 21 % (ref 21–57)
TIBC: 370 ug/dL (ref 236–444)
UIBC: 291 ug/dL (ref 120–384)

## 2020-12-07 LAB — FERRITIN: Ferritin: 46 ng/mL (ref 11–307)

## 2020-12-07 NOTE — Telephone Encounter (Signed)
No 12/06/20 los 

## 2020-12-08 DIAGNOSIS — Z20822 Contact with and (suspected) exposure to covid-19: Secondary | ICD-10-CM | POA: Diagnosis not present

## 2020-12-08 DIAGNOSIS — D631 Anemia in chronic kidney disease: Secondary | ICD-10-CM | POA: Insufficient documentation

## 2020-12-10 ENCOUNTER — Other Ambulatory Visit: Payer: Self-pay

## 2020-12-10 ENCOUNTER — Inpatient Hospital Stay: Payer: Medicare Other

## 2020-12-10 VITALS — BP 127/92 | HR 70 | Resp 20

## 2020-12-10 DIAGNOSIS — Z8041 Family history of malignant neoplasm of ovary: Secondary | ICD-10-CM | POA: Diagnosis not present

## 2020-12-10 DIAGNOSIS — M810 Age-related osteoporosis without current pathological fracture: Secondary | ICD-10-CM | POA: Diagnosis not present

## 2020-12-10 DIAGNOSIS — Z8249 Family history of ischemic heart disease and other diseases of the circulatory system: Secondary | ICD-10-CM | POA: Diagnosis not present

## 2020-12-10 DIAGNOSIS — D631 Anemia in chronic kidney disease: Secondary | ICD-10-CM

## 2020-12-10 DIAGNOSIS — F32A Depression, unspecified: Secondary | ICD-10-CM | POA: Diagnosis not present

## 2020-12-10 DIAGNOSIS — D649 Anemia, unspecified: Secondary | ICD-10-CM | POA: Diagnosis not present

## 2020-12-10 DIAGNOSIS — Z87891 Personal history of nicotine dependence: Secondary | ICD-10-CM | POA: Diagnosis not present

## 2020-12-10 DIAGNOSIS — Z90710 Acquired absence of both cervix and uterus: Secondary | ICD-10-CM | POA: Diagnosis not present

## 2020-12-10 DIAGNOSIS — E785 Hyperlipidemia, unspecified: Secondary | ICD-10-CM | POA: Diagnosis not present

## 2020-12-10 DIAGNOSIS — Z79899 Other long term (current) drug therapy: Secondary | ICD-10-CM | POA: Diagnosis not present

## 2020-12-10 MED ORDER — EPOETIN ALFA-EPBX 40000 UNIT/ML IJ SOLN
40000.0000 [IU] | Freq: Once | INTRAMUSCULAR | Status: AC
  Administered 2020-12-10: 40000 [IU] via SUBCUTANEOUS

## 2020-12-10 MED ORDER — EPOETIN ALFA-EPBX 40000 UNIT/ML IJ SOLN
INTRAMUSCULAR | Status: AC
  Filled 2020-12-10: qty 1

## 2020-12-31 ENCOUNTER — Ambulatory Visit: Payer: Medicare Other | Admitting: Hematology & Oncology

## 2020-12-31 ENCOUNTER — Other Ambulatory Visit: Payer: Medicare Other

## 2021-01-04 ENCOUNTER — Ambulatory Visit: Payer: Medicare Other

## 2021-01-04 ENCOUNTER — Other Ambulatory Visit: Payer: Medicare Other

## 2021-01-04 ENCOUNTER — Ambulatory Visit: Payer: Medicare Other | Admitting: Family

## 2021-01-05 ENCOUNTER — Other Ambulatory Visit: Payer: Self-pay

## 2021-01-05 ENCOUNTER — Inpatient Hospital Stay: Payer: Medicare Other | Attending: Hematology & Oncology

## 2021-01-05 ENCOUNTER — Inpatient Hospital Stay: Payer: Medicare Other | Admitting: Family

## 2021-01-05 ENCOUNTER — Encounter: Payer: Self-pay | Admitting: Family

## 2021-01-05 ENCOUNTER — Inpatient Hospital Stay: Payer: Medicare Other

## 2021-01-05 VITALS — BP 154/59 | HR 62 | Temp 98.6°F | Resp 18 | Ht 65.0 in | Wt 172.0 lb

## 2021-01-05 DIAGNOSIS — D649 Anemia, unspecified: Secondary | ICD-10-CM

## 2021-01-05 DIAGNOSIS — D631 Anemia in chronic kidney disease: Secondary | ICD-10-CM

## 2021-01-05 DIAGNOSIS — N189 Chronic kidney disease, unspecified: Secondary | ICD-10-CM | POA: Insufficient documentation

## 2021-01-05 DIAGNOSIS — Z79899 Other long term (current) drug therapy: Secondary | ICD-10-CM | POA: Insufficient documentation

## 2021-01-05 LAB — CBC WITH DIFFERENTIAL (CANCER CENTER ONLY)
Abs Immature Granulocytes: 0.01 10*3/uL (ref 0.00–0.07)
Basophils Absolute: 0.1 10*3/uL (ref 0.0–0.1)
Basophils Relative: 1 %
Eosinophils Absolute: 0.3 10*3/uL (ref 0.0–0.5)
Eosinophils Relative: 5 %
HCT: 33.2 % — ABNORMAL LOW (ref 36.0–46.0)
Hemoglobin: 10.6 g/dL — ABNORMAL LOW (ref 12.0–15.0)
Immature Granulocytes: 0 %
Lymphocytes Relative: 31 %
Lymphs Abs: 1.7 10*3/uL (ref 0.7–4.0)
MCH: 27 pg (ref 26.0–34.0)
MCHC: 31.9 g/dL (ref 30.0–36.0)
MCV: 84.7 fL (ref 80.0–100.0)
Monocytes Absolute: 0.5 10*3/uL (ref 0.1–1.0)
Monocytes Relative: 8 %
Neutro Abs: 3.1 10*3/uL (ref 1.7–7.7)
Neutrophils Relative %: 55 %
Platelet Count: 256 10*3/uL (ref 150–400)
RBC: 3.92 MIL/uL (ref 3.87–5.11)
RDW: 13.4 % (ref 11.5–15.5)
WBC Count: 5.6 10*3/uL (ref 4.0–10.5)
nRBC: 0 % (ref 0.0–0.2)

## 2021-01-05 LAB — CMP (CANCER CENTER ONLY)
ALT: 21 U/L (ref 0–44)
AST: 27 U/L (ref 15–41)
Albumin: 4.4 g/dL (ref 3.5–5.0)
Alkaline Phosphatase: 58 U/L (ref 38–126)
Anion gap: 4 — ABNORMAL LOW (ref 5–15)
BUN: 22 mg/dL (ref 8–23)
CO2: 32 mmol/L (ref 22–32)
Calcium: 10.4 mg/dL — ABNORMAL HIGH (ref 8.9–10.3)
Chloride: 101 mmol/L (ref 98–111)
Creatinine: 1.07 mg/dL — ABNORMAL HIGH (ref 0.44–1.00)
GFR, Estimated: 53 mL/min — ABNORMAL LOW (ref >60)
Glucose, Bld: 82 mg/dL (ref 70–99)
Potassium: 4.1 mmol/L (ref 3.5–5.1)
Sodium: 137 mmol/L (ref 135–145)
Total Bilirubin: 0.3 mg/dL (ref 0.3–1.2)
Total Protein: 6.4 g/dL — ABNORMAL LOW (ref 6.5–8.1)

## 2021-01-05 MED ORDER — EPOETIN ALFA-EPBX 40000 UNIT/ML IJ SOLN
INTRAMUSCULAR | Status: AC
  Filled 2021-01-05: qty 1

## 2021-01-05 MED ORDER — EPOETIN ALFA-EPBX 40000 UNIT/ML IJ SOLN
40000.0000 [IU] | Freq: Once | INTRAMUSCULAR | Status: AC
  Administered 2021-01-05: 40000 [IU] via SUBCUTANEOUS

## 2021-01-05 NOTE — Progress Notes (Signed)
Hematology and Oncology Follow Up Visit  Kim Grant [id number] [birth year] 80 y.o. 01/05/2021   Principle Diagnosis:  Erythropoietin deficiency anemia   Current Therapy:   Retacrit 40,000 units SQ for Hgb < 11   Interim History:  Kim Grant is here today for follow-up. She is feeling much better and has no complaints at this time.  She is hoping to be able to schedule her right knee replacement soon. She has been taking Mobic which helps with her pains.  Hgb today is 10.6, MCV 84. Iron studies are pending.  No blood loss noted. No bruising or petechiae.  No fever, chills, n/v, cough, rash, dizziness, SOB, chest pain, palpitations, abdominal pain or changes in bowel or bladder habits.  No numbness or tingling in her extremities.  No falls or syncope to report.  She has maintained a good appetite and is staying well hydrated. Her weight is stable at 172 lbs.   ECOG Performance Status: 1 - Symptomatic but completely ambulatory  Medications:  Allergies as of 01/05/2021   No Known Allergies     Medication List       Accurate as of January 05, 2021  3:10 PM. If you have any questions, ask your nurse or doctor.        calcium citrate-vitamin D 315-200 MG-UNIT tablet Commonly known as: CITRACAL+D Take 3 tablets by mouth daily.   citalopram 40 MG tablet Commonly known as: CELEXA Take 1 tablet (40 mg total) by mouth daily.   famotidine 20 MG tablet Commonly known as: PEPCID Take by mouth.   ibuprofen 200 MG tablet Commonly known as: ADVIL Take 200 mg by mouth every 6 (six) hours as needed for mild pain or moderate pain.   meclizine 25 MG tablet Commonly known as: ANTIVERT Take 1 tablet (25 mg total) by mouth 3 (three) times daily as needed for dizziness.   meloxicam 15 MG tablet Commonly known as: MOBIC Take by mouth.   multivitamin with minerals Tabs tablet Take 1 tablet by mouth daily.   pravastatin 40 MG tablet Commonly known as: PRAVACHOL Take 1 tablet (40 mg  total) by mouth daily.       Allergies: No Known Allergies  Past Medical History, Surgical history, Social history, and Family History were reviewed and updated.  Review of Systems: All other 10 point review of systems is negative.   Physical Exam:  height is 5\' 5"  (1.651 m) and weight is 172 lb (78 kg). Her oral temperature is 98.6 F (37 C). Her blood pressure is 154/59 (abnormal) and her pulse is 62. Her respiration is 18 and oxygen saturation is 98%.   Wt Readings from Last 3 Encounters:  01/05/21 172 lb (78 kg)  12/06/20 168 lb (76.2 kg)  11/18/20 166 lb 12.8 oz (75.7 kg)    Ocular: Sclerae unicteric, pupils equal, round and reactive to light Ear-nose-throat: Oropharynx clear, dentition fair Lymphatic: No cervical or supraclavicular adenopathy Lungs no rales or rhonchi, good excursion bilaterally Heart regular rate and rhythm, no murmur appreciated Abd soft, nontender, positive bowel sounds MSK no focal spinal tenderness, no joint edema Neuro: non-focal, well-oriented, appropriate affect Breasts: Deferred   Lab Results  Component Value Date   WBC 5.6 01/05/2021   HGB 10.6 (L) 01/05/2021   HCT 33.2 (L) 01/05/2021   MCV 84.7 01/05/2021   PLT 256 01/05/2021   Lab Results  Component Value Date   FERRITIN 46 12/06/2020   IRON 79 12/06/2020   TIBC 370 12/06/2020  UIBC 291 12/06/2020   IRONPCTSAT 21 12/06/2020   Lab Results  Component Value Date   RETICCTPCT 0.9 12/06/2020   RBC 3.92 01/05/2021   No results found for: KPAFRELGTCHN, LAMBDASER, KAPLAMBRATIO No results found for: IGGSERUM, IGA, IGMSERUM No results found for: Marda Stalker, SPEI   Chemistry      Component Value Date/Time   NA 137 01/05/2021 1419   K 4.1 01/05/2021 1419   CL 101 01/05/2021 1419   CO2 32 01/05/2021 1419   BUN 22 01/05/2021 1419   CREATININE 1.07 (H) 01/05/2021 1419      Component Value Date/Time   CALCIUM 10.4 (H)  01/05/2021 1419   ALKPHOS 58 01/05/2021 1419   AST 27 01/05/2021 1419   ALT 21 01/05/2021 1419   BILITOT 0.3 01/05/2021 1419       Impression and Plan: Kim Grant is a very pleasant 80 yo caucasian female with erythropoietin deficiency anemia.  ESA given for Hgb 10.6.  Lab and injection monthly with follow-up in 2 months.  She can contact our office with any questions or concerns.   [name], NP 3/16/20223:10 PM

## 2021-02-04 ENCOUNTER — Inpatient Hospital Stay: Payer: Medicare Other | Attending: Hematology & Oncology

## 2021-02-04 ENCOUNTER — Other Ambulatory Visit: Payer: Self-pay

## 2021-02-04 ENCOUNTER — Inpatient Hospital Stay: Payer: Medicare Other

## 2021-02-04 DIAGNOSIS — N189 Chronic kidney disease, unspecified: Secondary | ICD-10-CM | POA: Insufficient documentation

## 2021-02-04 DIAGNOSIS — D631 Anemia in chronic kidney disease: Secondary | ICD-10-CM | POA: Diagnosis not present

## 2021-02-04 LAB — CBC WITH DIFFERENTIAL (CANCER CENTER ONLY)
Abs Immature Granulocytes: 0.01 10*3/uL (ref 0.00–0.07)
Basophils Absolute: 0.1 10*3/uL (ref 0.0–0.1)
Basophils Relative: 1 %
Eosinophils Absolute: 0.3 10*3/uL (ref 0.0–0.5)
Eosinophils Relative: 4 %
HCT: 35.2 % — ABNORMAL LOW (ref 36.0–46.0)
Hemoglobin: 11.3 g/dL — ABNORMAL LOW (ref 12.0–15.0)
Immature Granulocytes: 0 %
Lymphocytes Relative: 29 %
Lymphs Abs: 2.2 10*3/uL (ref 0.7–4.0)
MCH: 26.1 pg (ref 26.0–34.0)
MCHC: 32.1 g/dL (ref 30.0–36.0)
MCV: 81.3 fL (ref 80.0–100.0)
Monocytes Absolute: 0.6 10*3/uL (ref 0.1–1.0)
Monocytes Relative: 8 %
Neutro Abs: 4.4 10*3/uL (ref 1.7–7.7)
Neutrophils Relative %: 58 %
Platelet Count: 304 10*3/uL (ref 150–400)
RBC: 4.33 MIL/uL (ref 3.87–5.11)
RDW: 13.9 % (ref 11.5–15.5)
WBC Count: 7.5 10*3/uL (ref 4.0–10.5)
nRBC: 0 % (ref 0.0–0.2)

## 2021-02-04 LAB — CMP (CANCER CENTER ONLY)
ALT: 16 U/L (ref 0–44)
AST: 22 U/L (ref 15–41)
Albumin: 4.3 g/dL (ref 3.5–5.0)
Alkaline Phosphatase: 49 U/L (ref 38–126)
Anion gap: 6 (ref 5–15)
BUN: 24 mg/dL — ABNORMAL HIGH (ref 8–23)
CO2: 30 mmol/L (ref 22–32)
Calcium: 10.4 mg/dL — ABNORMAL HIGH (ref 8.9–10.3)
Chloride: 98 mmol/L (ref 98–111)
Creatinine: 1.01 mg/dL — ABNORMAL HIGH (ref 0.44–1.00)
GFR, Estimated: 57 mL/min — ABNORMAL LOW (ref >60)
Glucose, Bld: 89 mg/dL (ref 70–99)
Potassium: 4.3 mmol/L (ref 3.5–5.1)
Sodium: 134 mmol/L — ABNORMAL LOW (ref 135–145)
Total Bilirubin: 0.3 mg/dL (ref 0.3–1.2)
Total Protein: 6.6 g/dL (ref 6.5–8.1)

## 2021-02-04 NOTE — Progress Notes (Signed)
Pt. Does not need Retacrit today per lab results.

## 2021-02-23 DIAGNOSIS — H6123 Impacted cerumen, bilateral: Secondary | ICD-10-CM | POA: Diagnosis not present

## 2021-03-07 ENCOUNTER — Inpatient Hospital Stay: Payer: Medicare Other

## 2021-03-07 ENCOUNTER — Inpatient Hospital Stay: Payer: Medicare Other | Attending: Hematology & Oncology

## 2021-03-07 ENCOUNTER — Inpatient Hospital Stay: Payer: Medicare Other | Admitting: Family

## 2021-03-22 ENCOUNTER — Telehealth: Payer: Self-pay | Admitting: *Deleted

## 2021-03-22 NOTE — Telephone Encounter (Signed)
Called patient to reschedule missed appointment - patient stated that she was told that she did not have to come back unless she had an issue with her hemoglobin.

## 2021-04-04 DIAGNOSIS — Z01818 Encounter for other preprocedural examination: Secondary | ICD-10-CM | POA: Diagnosis not present

## 2021-04-04 DIAGNOSIS — M1711 Unilateral primary osteoarthritis, right knee: Secondary | ICD-10-CM | POA: Diagnosis not present

## 2021-04-04 DIAGNOSIS — R9431 Abnormal electrocardiogram [ECG] [EKG]: Secondary | ICD-10-CM | POA: Diagnosis not present

## 2021-04-04 DIAGNOSIS — Z01810 Encounter for preprocedural cardiovascular examination: Secondary | ICD-10-CM | POA: Diagnosis not present

## 2021-04-04 DIAGNOSIS — Z01812 Encounter for preprocedural laboratory examination: Secondary | ICD-10-CM | POA: Diagnosis not present

## 2021-04-11 DIAGNOSIS — Z96651 Presence of right artificial knee joint: Secondary | ICD-10-CM

## 2021-04-11 DIAGNOSIS — M1711 Unilateral primary osteoarthritis, right knee: Secondary | ICD-10-CM | POA: Diagnosis not present

## 2021-04-11 DIAGNOSIS — Z20822 Contact with and (suspected) exposure to covid-19: Secondary | ICD-10-CM | POA: Diagnosis not present

## 2021-04-11 HISTORY — DX: Presence of right artificial knee joint: Z96.651

## 2021-04-12 DIAGNOSIS — M1711 Unilateral primary osteoarthritis, right knee: Secondary | ICD-10-CM | POA: Diagnosis not present

## 2021-04-12 DIAGNOSIS — Z20822 Contact with and (suspected) exposure to covid-19: Secondary | ICD-10-CM | POA: Diagnosis not present

## 2021-04-13 DIAGNOSIS — Z20822 Contact with and (suspected) exposure to covid-19: Secondary | ICD-10-CM | POA: Diagnosis not present

## 2021-04-13 DIAGNOSIS — M1711 Unilateral primary osteoarthritis, right knee: Secondary | ICD-10-CM | POA: Diagnosis not present

## 2021-04-13 DIAGNOSIS — Z96651 Presence of right artificial knee joint: Secondary | ICD-10-CM | POA: Diagnosis not present

## 2021-04-14 DIAGNOSIS — D649 Anemia, unspecified: Secondary | ICD-10-CM | POA: Diagnosis not present

## 2021-04-14 DIAGNOSIS — K219 Gastro-esophageal reflux disease without esophagitis: Secondary | ICD-10-CM | POA: Diagnosis not present

## 2021-04-14 DIAGNOSIS — Z471 Aftercare following joint replacement surgery: Secondary | ICD-10-CM | POA: Diagnosis not present

## 2021-04-14 DIAGNOSIS — K2270 Barrett's esophagus without dysplasia: Secondary | ICD-10-CM | POA: Diagnosis not present

## 2021-04-14 DIAGNOSIS — E7800 Pure hypercholesterolemia, unspecified: Secondary | ICD-10-CM | POA: Diagnosis not present

## 2021-04-14 DIAGNOSIS — F32A Depression, unspecified: Secondary | ICD-10-CM | POA: Diagnosis not present

## 2021-04-14 DIAGNOSIS — I350 Nonrheumatic aortic (valve) stenosis: Secondary | ICD-10-CM | POA: Diagnosis not present

## 2021-04-14 DIAGNOSIS — Z9181 History of falling: Secondary | ICD-10-CM | POA: Diagnosis not present

## 2021-04-14 DIAGNOSIS — Z79891 Long term (current) use of opiate analgesic: Secondary | ICD-10-CM | POA: Diagnosis not present

## 2021-04-14 DIAGNOSIS — M810 Age-related osteoporosis without current pathological fracture: Secondary | ICD-10-CM | POA: Diagnosis not present

## 2021-04-14 DIAGNOSIS — Z7982 Long term (current) use of aspirin: Secondary | ICD-10-CM | POA: Diagnosis not present

## 2021-04-14 DIAGNOSIS — Z96651 Presence of right artificial knee joint: Secondary | ICD-10-CM | POA: Diagnosis not present

## 2021-04-14 DIAGNOSIS — Z79899 Other long term (current) drug therapy: Secondary | ICD-10-CM | POA: Diagnosis not present

## 2021-04-14 DIAGNOSIS — Z90710 Acquired absence of both cervix and uterus: Secondary | ICD-10-CM | POA: Diagnosis not present

## 2021-04-28 ENCOUNTER — Other Ambulatory Visit: Payer: Self-pay

## 2021-04-28 ENCOUNTER — Encounter: Payer: Self-pay | Admitting: Physical Therapy

## 2021-04-28 ENCOUNTER — Ambulatory Visit: Payer: Medicare Other | Attending: Sports Medicine | Admitting: Physical Therapy

## 2021-04-28 DIAGNOSIS — R262 Difficulty in walking, not elsewhere classified: Secondary | ICD-10-CM | POA: Insufficient documentation

## 2021-04-28 DIAGNOSIS — M25661 Stiffness of right knee, not elsewhere classified: Secondary | ICD-10-CM | POA: Insufficient documentation

## 2021-04-28 DIAGNOSIS — R600 Localized edema: Secondary | ICD-10-CM

## 2021-04-28 DIAGNOSIS — M25561 Pain in right knee: Secondary | ICD-10-CM | POA: Diagnosis not present

## 2021-04-28 NOTE — Therapy (Signed)
[facility] [address] [place], [place], [address] Phone: [phone]   Fax:  [phone]  Physical Therapy Evaluation  Patient Details  Name: Kim Grant MRN: [id number] Date of Birth: 17-Oct-1941 Referring Provider (PT): Leonel Ramsay   Encounter Date: 04/28/2021   PT End of Session - 04/28/21 1603     Visit Number 1    Date for PT Re-Evaluation 06/29/21    PT Start Time 1522    PT Stop Time 1610    PT Time Calculation (min) 48 min    Activity Tolerance Patient tolerated treatment well    Behavior During Therapy [facility] for tasks assessed/performed;Anxious             Past Medical History:  Diagnosis Date   Depression    Hyperlipemia    Osteoporosis     Past Surgical History:  Procedure Laterality Date   ABDOMINAL HYSTERECTOMY     BREAST SURGERY     ROTATOR CUFF REPAIR      There were no vitals filed for this visit.    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  She had home PT and reports that she feels like she is doing well, but is frustrated, "I want to be better yesterday"    Limitations Lifting;House hold activities;Walking;Standing    Patient Stated Goals walk better, have less pain better motions    Currently in Pain? Yes    Pain Score 2     Pain Location Knee    Pain Orientation Right    Pain Descriptors / Indicators Sore    Pain Type Acute pain;Surgical pain    Pain Onset 1 to 4 weeks ago    Pain Frequency Constant    Aggravating Factors  sitting, bending and walking pain up to 7/10    Pain Relieving Factors ice, rest, pain can be a 2/10    Effect of Pain on Daily Activities limits everything                [facility] PT Assessment - 04/28/21 0001       Assessment   Medical Diagnosis s/p right TKA    Referring Provider (PT) Leonel Ramsay    Onset Date/Surgical Date 04/11/21      Precautions   Precautions None      Balance Screen   Has the  patient fallen in the past 6 months No    Has the patient had a decrease in activity level because of a fear of falling?  No    Is the patient reluctant to leave their home because of a fear of falling?  No      Home Environment   Additional Comments one step into the home, does housework, yardwork and gardening      Prior Function   Level of Independence Independent    Vocation Retired    Leisure gym classes      Observation/Other Assessments-Edema    Edema Circumferential      Circumferential Edema   Circumferential - Right 47 cm mid patella    Circumferential - Left  42 cm      AROM   AROM Assessment Site Knee    Right/Left Knee Right    Right Knee Extension 10    Right Knee Flexion 88      PROM   PROM Assessment Site Knee    Right/Left Shoulder Right    Right/Left Knee Right  Right Knee Extension 0    Right Knee Flexion 94      Palpation   Palpation comment tight distal scar, sore and tender here as well      Ambulation/Gait   Gait Comments with SPC, good stps, supervision      Standardized Balance Assessment   Standardized Balance Assessment Timed Up and Go Test      Timed Up and Go Test   Normal TUG (seconds) 17                        Objective measurements completed on examination: See above findings.       [facility] Adult PT Treatment/Exercise - 04/28/21 0001       Exercises   Exercises Knee/Hip      Knee/Hip Exercises: Aerobic   Nustep level 4 x 6 minutes                      PT Short Term Goals - 04/28/21 1612       PT SHORT TERM GOAL #1   Title independent with initial HEP    Time 2    Period Weeks    Status New               PT Long Term Goals - 04/28/21 1612       PT LONG TERM GOAL #1   Title increase AROM of right knee to 0-120 degrees flexion    Time 12    Period Weeks    Status New      PT LONG TERM GOAL #2   Title walk without device without deviation    Time 12    Period Weeks     Status New      PT LONG TERM GOAL #3   Title decrease pain 50%    Time 12    Period Weeks    Status New      PT LONG TERM GOAL #4   Title go up and down stairs step over step    Time 12    Period Weeks    Status New      PT LONG TERM GOAL #5   Title independent with RICE    Time 12    Period Weeks    Status New                    Plan - 04/28/21 1604     Clinical Impression Statement Patient had a right TKA on 04/11/21, she had home PT and is doing well.  She has steristrips in place, has tenderness and swelling, sore distally.  Walking with a SPC, some limitation in ROM she is wanting to be better yesterday    Stability/Clinical Decision Making Evolving/Moderate complexity    Clinical Decision Making Low    Rehab Potential Good    PT Frequency 2x / week    PT Duration 8 weeks    PT Treatment/Interventions ADLs/Self Care Home Management;Cryotherapy;Gait training;Balance training;Therapeutic exercise;Therapeutic activities;Functional mobility training;Stair training;Patient/family education;Manual techniques;Vasopneumatic Device    PT Next Visit Plan slowly start work on ROM, strength and function    Consulted and Agree with Plan of Care Patient             Patient will benefit from skilled therapeutic intervention in order to improve the following deficits and impairments:  Abnormal gait, Decreased range of motion, Difficulty walking, Decreased endurance, Pain, Impaired flexibility, Decreased scar  mobility, Decreased balance, Decreased mobility, Decreased strength, Increased edema  Visit Diagnosis: Acute pain of right knee - Plan: PT plan of care cert/re-cert  Stiffness of right knee, not elsewhere classified - Plan: PT plan of care cert/re-cert  Difficulty in walking, not elsewhere classified - Plan: PT plan of care cert/re-cert  Localized edema - Plan: PT plan of care cert/re-cert     Problem List Patient Active Problem List   Diagnosis Date Noted    Erythropoietin deficiency anemia 12/08/2020   Barrett's esophagus without dysplasia 11/25/2018   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HCC) 05/25/2017   Age-related osteoporosis without current pathological fracture 08/04/2014   Chronic bronchitis (HCC) 08/04/2014   Pure hypercholesterolemia 08/04/2014   Aortic stenosis 08/04/2014    Jearld Lesch., PT 04/28/2021, 4:19 PM  [facility] [address] [place], [place], [address] Phone: [phone]   Fax:  [phone]  Name: Kim Grant MRN: [id number] Date of Birth: 10-25-1940

## 2021-05-02 ENCOUNTER — Encounter: Payer: Self-pay | Admitting: Physical Therapy

## 2021-05-02 ENCOUNTER — Other Ambulatory Visit: Payer: Self-pay

## 2021-05-02 ENCOUNTER — Ambulatory Visit: Payer: Medicare Other | Admitting: Physical Therapy

## 2021-05-02 DIAGNOSIS — M25661 Stiffness of right knee, not elsewhere classified: Secondary | ICD-10-CM | POA: Diagnosis not present

## 2021-05-02 DIAGNOSIS — R262 Difficulty in walking, not elsewhere classified: Secondary | ICD-10-CM | POA: Diagnosis not present

## 2021-05-02 DIAGNOSIS — M25561 Pain in right knee: Secondary | ICD-10-CM | POA: Diagnosis not present

## 2021-05-02 DIAGNOSIS — R600 Localized edema: Secondary | ICD-10-CM

## 2021-05-02 NOTE — Therapy (Signed)
[facility] [address] [place], [place], [address] Phone: [phone]   Fax:  [phone]  Physical Therapy Treatment  Patient Details  Name: Kim Grant MRN: [id number] Date of Birth: 1941-08-09 Referring Provider (PT): Leonel Ramsay   Encounter Date: 05/02/2021   PT End of Session - 05/02/21 1443     Visit Number 2    Date for PT Re-Evaluation 06/29/21    PT Start Time 1355    PT Stop Time 1435    PT Time Calculation (min) 40 min    Activity Tolerance Patient tolerated treatment well    Behavior During Therapy [facility] for tasks assessed/performed;Anxious             Past Medical History:  Diagnosis Date   Depression    Hyperlipemia    Osteoporosis     Past Surgical History:  Procedure Laterality Date   ABDOMINAL HYSTERECTOMY     BREAST SURGERY     ROTATOR CUFF REPAIR      There were no vitals filed for this visit.   Subjective Assessment - 05/02/21 1411     Subjective Patient reports doing well, she saw the MD last week, very pleased.  She reports dizziness today    Currently in Pain? Yes    Pain Location Knee    Pain Orientation Right    Aggravating Factors  bending                               [facility] Adult PT Treatment/Exercise - 05/02/21 0001       Ambulation/Gait   Gait Comments no deivce, walking slow but overall very good, some light HHA at times, negotiated curbs and slopes      High Level Balance   High Level Balance Comments on airex marching and head turns      Knee/Hip Exercises: Aerobic   Recumbent Bike x 6 minutes full revs, very difficult at first    Nustep level 4 x 6 minutes      Knee/Hip Exercises: Supine   Short Arc Quad Sets Right;10 reps;3 sets    Short Arc Quad Sets Limitations 3#    Other Supine Knee/Hip Exercises feet on ball K2C, bridges      Manual Therapy   Manual Therapy Passive ROM    Passive ROM right knee                      PT  Short Term Goals - 04/28/21 1612       PT SHORT TERM GOAL #1   Title independent with initial HEP    Time 2    Period Weeks    Status New               PT Long Term Goals - 04/28/21 1612       PT LONG TERM GOAL #1   Title increase AROM of right knee to 0-120 degrees flexion    Time 12    Period Weeks    Status New      PT LONG TERM GOAL #2   Title walk without device without deviation    Time 12    Period Weeks    Status New      PT LONG TERM GOAL #3   Title decrease pain 50%    Time 12    Period Weeks    Status New  PT LONG TERM GOAL #4   Title go up and down stairs step over step    Time 12    Period Weeks    Status New      PT LONG TERM GOAL #5   Title independent with RICE    Time 12    Period Weeks    Status New                   Plan - 05/02/21 1444     Clinical Impression Statement Patient doing very well, reports a little dizzy today, I added bike, walking outside without device and started some quad activation and some proprioception    PT Next Visit Plan slowly start work on ROM, strength and function    Consulted and Agree with Plan of Care Patient             Patient will benefit from skilled therapeutic intervention in order to improve the following deficits and impairments:  Abnormal gait, Decreased range of motion, Difficulty walking, Decreased endurance, Pain, Impaired flexibility, Decreased scar mobility, Decreased balance, Decreased mobility, Decreased strength, Increased edema  Visit Diagnosis: Acute pain of right knee  Stiffness of right knee, not elsewhere classified  Difficulty in walking, not elsewhere classified  Localized edema     Problem List Patient Active Problem List   Diagnosis Date Noted   Erythropoietin deficiency anemia 12/08/2020   Barrett's esophagus without dysplasia 11/25/2018   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HCC) 05/25/2017   Age-related osteoporosis without current  pathological fracture 08/04/2014   Chronic bronchitis (HCC) 08/04/2014   Pure hypercholesterolemia 08/04/2014   Aortic stenosis 08/04/2014    Kim Lesch., PT 05/02/2021, 2:46 PM  [facility] [address] [place], [place], [address] Phone: [phone]   Fax:  [phone]  Name: Kim Grant MRN: [id number] Date of Birth: Jan 24, 1941

## 2021-05-04 ENCOUNTER — Other Ambulatory Visit: Payer: Self-pay

## 2021-05-04 ENCOUNTER — Encounter: Payer: Self-pay | Admitting: Physical Therapy

## 2021-05-04 ENCOUNTER — Ambulatory Visit: Payer: Medicare Other | Admitting: Physical Therapy

## 2021-05-04 DIAGNOSIS — R262 Difficulty in walking, not elsewhere classified: Secondary | ICD-10-CM

## 2021-05-04 DIAGNOSIS — M25661 Stiffness of right knee, not elsewhere classified: Secondary | ICD-10-CM

## 2021-05-04 DIAGNOSIS — R600 Localized edema: Secondary | ICD-10-CM | POA: Diagnosis not present

## 2021-05-04 DIAGNOSIS — M25561 Pain in right knee: Secondary | ICD-10-CM | POA: Diagnosis not present

## 2021-05-04 NOTE — Therapy (Signed)
[place] [place], [place], [address] Phone: [phone]   Fax:  [phone]  Physical Therapy Treatment  Patient Details  Name: Kim Grant MRN: [id number] Date of Birth: 1940-11-23 Referring Provider (PT): Davina Poke   Encounter Date: 05/04/2021   PT End of Session - 05/04/21 1410     Visit Number 3    Date for PT Re-Evaluation 06/29/21    PT Start Time 7106    PT Stop Time 1439    PT Time Calculation (min) 41 min    Activity Tolerance Patient tolerated treatment well    Behavior During Therapy [facility] for tasks assessed/performed             Past Medical History:  Diagnosis Date   Depression    Hyperlipemia    Osteoporosis     Past Surgical History:  Procedure Laterality Date   ABDOMINAL HYSTERECTOMY     BREAST SURGERY     ROTATOR CUFF REPAIR      There were no vitals filed for this visit.   Subjective Assessment - 05/04/21 1405     Subjective I went shopping yesterday for 2 hours, I was tired and was sore, but after sleeping felt good    Currently in Pain? No/denies                [facility] PT Assessment - 05/04/21 0001       AROM   Right Knee Flexion 108      PROM   Right Knee Flexion 114                           [facility] Adult PT Treatment/Exercise - 05/04/21 0001       Ambulation/Gait   Gait Comments no device stairs working step over step      Knee/Hip Exercises: Aerobic   Recumbent Bike x 6 minutes full revs, very difficult at first    Nustep level 5 x 6 minutes      Knee/Hip Exercises: Machines for Strengthening   Cybex Knee Flexion 20# 2x10    Cybex Leg Press 20# and no weight working on flexion      Knee/Hip Exercises: Supine   Other Supine Knee/Hip Exercises feet on ball K2C, bridges, isometric abs      Manual Therapy   Manual Therapy Passive ROM;Joint mobilization    Joint Mobilization gentle for pain and mobility    Soft tissue mobilization gentle  scar and tissue    Passive ROM right knee                      PT Short Term Goals - 05/04/21 1426       PT SHORT TERM GOAL #1   Title independent with initial HEP    Status Achieved               PT Long Term Goals - 05/04/21 1425       PT LONG TERM GOAL #1   Title increase AROM of right knee to 0-120 degrees flexion    Status Partially Met      PT LONG TERM GOAL #2   Title walk without device without deviation    Status Partially Met                   Plan - 05/04/21 1410     Clinical Impression Statement Patient continues to do very  well, walking without device, able to do stairs step over step, she had some difficulty going down 6" step step over step.  Full revolutions on the bike.  She told me that she practiceddriving last night in a safe location, 20 degree ROM improvement over the past week    PT Next Visit Plan continue to add to her function    Consulted and Agree with Plan of Care Patient             Patient will benefit from skilled therapeutic intervention in order to improve the following deficits and impairments:  Abnormal gait, Decreased range of motion, Difficulty walking, Decreased endurance, Pain, Impaired flexibility, Decreased scar mobility, Decreased balance, Decreased mobility, Decreased strength, Increased edema  Visit Diagnosis: Acute pain of right knee  Stiffness of right knee, not elsewhere classified  Difficulty in walking, not elsewhere classified  Localized edema     Problem List Patient Active Problem List   Diagnosis Date Noted   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 [place], [place], [address] Phone: [phone]   Fax:  [phone]  Name: Kim Grant MRN: [id number] Date of Birth: 12-24-1940

## 2021-05-06 ENCOUNTER — Telehealth: Payer: Self-pay | Admitting: Family Medicine

## 2021-05-06 NOTE — Telephone Encounter (Signed)
Pt called in complaining of being light headed. She just had a Total Knee Replacement on 04/11/21. She has been feeling light headed, off balance and something doesn't feel right. I transferred her over to Nurse Triage.

## 2021-05-06 NOTE — Telephone Encounter (Signed)
Pt was advised to give [place] a call back and she says she was told she needs to be seen by her primary within 24 hours. We let her know we had no available appointments anywhere. She was told to go to an urgent care and she understood.

## 2021-05-09 ENCOUNTER — Encounter: Payer: Self-pay | Admitting: Physical Therapy

## 2021-05-09 ENCOUNTER — Ambulatory Visit: Payer: Medicare Other | Admitting: Physical Therapy

## 2021-05-09 ENCOUNTER — Other Ambulatory Visit: Payer: Self-pay

## 2021-05-09 DIAGNOSIS — M25661 Stiffness of right knee, not elsewhere classified: Secondary | ICD-10-CM | POA: Diagnosis not present

## 2021-05-09 DIAGNOSIS — R600 Localized edema: Secondary | ICD-10-CM

## 2021-05-09 DIAGNOSIS — M25561 Pain in right knee: Secondary | ICD-10-CM | POA: Diagnosis not present

## 2021-05-09 DIAGNOSIS — R262 Difficulty in walking, not elsewhere classified: Secondary | ICD-10-CM

## 2021-05-09 NOTE — Therapy (Signed)
[place] [place], [place], [address] Phone: [phone]   Fax:  [phone]  Physical Therapy Treatment  Patient Details  Name: Kim Grant MRN: [id number] Date of Birth: 12/06/1940 Referring Provider (PT): Davina Poke   Encounter Date: 05/09/2021   PT End of Session - 05/09/21 1431     Visit Number 4    Date for PT Re-Evaluation 06/29/21    PT Start Time 1400    PT Stop Time 1450    PT Time Calculation (min) 50 min    Activity Tolerance Patient tolerated treatment well    Behavior During Therapy [facility] for tasks assessed/performed             Past Medical History:  Diagnosis Date   Depression    Hyperlipemia    Osteoporosis     Past Surgical History:  Procedure Laterality Date   ABDOMINAL HYSTERECTOMY     BREAST SURGERY     ROTATOR CUFF REPAIR      There were no vitals filed for this visit.   Subjective Assessment - 05/09/21 1405     Subjective Patient reports that she has had sciatics issues on the right in the past, and the past few days she has had more difficulty sleeping due to pain in the right buttock and right lateral thigh.    Currently in Pain? Yes    Pain Score 5     Pain Location Buttocks    Pain Orientation Right    Pain Descriptors / Indicators Aching    Pain Radiating Towards down the right lateral thigh    Aggravating Factors  worse at night, can't sleep pain up to 7/10                               [facility] Adult PT Treatment/Exercise - 05/09/21 0001       Ambulation/Gait   Gait Comments no device stairs working step over step      High Level Balance   High Level Balance Comments on airex marching and head turns      Knee/Hip Exercises: Stretches   Passive Hamstring Stretch Right;4 reps;20 seconds    Piriformis Stretch Right;4 reps;20 seconds      Knee/Hip Exercises: Aerobic   Recumbent Bike 6 minutes full revs    Nustep level 5 x 6 minutes       Knee/Hip Exercises: Machines for Strengthening   Cybex Knee Flexion 20# 2x10      Electrical Stimulation   Electrical Stimulation Location right buttock    Electrical Stimulation Action IFC    Electrical Stimulation Parameters supine    Electrical Stimulation Goals Pain      Manual Therapy   Manual Therapy Passive ROM;Joint mobilization    Soft tissue mobilization gentle scar and tissue    Passive ROM right knee                      PT Short Term Goals - 05/04/21 1426       PT SHORT TERM GOAL #1   Title independent with initial HEP    Status Achieved               PT Long Term Goals - 05/09/21 1434       PT LONG TERM GOAL #1   Title increase AROM of right knee to 0-120 degrees flexion    Status Partially  Met      PT LONG TERM GOAL #2   Title walk without device without deviation    Status Achieved                   Plan - 05/09/21 1432     Clinical Impression Statement Patient comes in with c/o right buttock and sciatic type pain, she reports that she has had before and had right after surgery.  She is tender in the right buttock, tight and painful piriformis stretch.  I did some specific stretches and added some IFC to help with the pain, as she is reporting difficulty sleeping due to the buttock pain.    PT Next Visit Plan see how the sciatica goes    Consulted and Agree with Plan of Care Patient             Patient will benefit from skilled therapeutic intervention in order to improve the following deficits and impairments:  Abnormal gait, Decreased range of motion, Difficulty walking, Decreased endurance, Pain, Impaired flexibility, Decreased scar mobility, Decreased balance, Decreased mobility, Decreased strength, Increased edema  Visit Diagnosis: Acute pain of right knee  Stiffness of right knee, not elsewhere classified  Difficulty in walking, not elsewhere classified  Localized edema     Problem List Patient Active  Problem List   Diagnosis Date Noted   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 [place], [place], [address] Phone: [phone]   Fax:  [phone]  Name: Kim Grant MRN: [id number] Date of Birth: August 21, 1941

## 2021-05-11 ENCOUNTER — Other Ambulatory Visit: Payer: Self-pay

## 2021-05-11 ENCOUNTER — Encounter: Payer: Self-pay | Admitting: Physical Therapy

## 2021-05-11 ENCOUNTER — Ambulatory Visit: Payer: Medicare Other | Admitting: Physical Therapy

## 2021-05-11 DIAGNOSIS — M25661 Stiffness of right knee, not elsewhere classified: Secondary | ICD-10-CM | POA: Diagnosis not present

## 2021-05-11 DIAGNOSIS — M25561 Pain in right knee: Secondary | ICD-10-CM

## 2021-05-11 DIAGNOSIS — R600 Localized edema: Secondary | ICD-10-CM

## 2021-05-11 DIAGNOSIS — R262 Difficulty in walking, not elsewhere classified: Secondary | ICD-10-CM

## 2021-05-11 NOTE — Therapy (Signed)
[place]. [place], [place], [address] Phone: [phone]   Fax:  [phone]  Physical Therapy Treatment  Patient Details  Name: Kim Grant MRN: [id number] Date of Birth: 1940-12-31 Referring Provider (PT): Davina Poke   Encounter Date: 05/11/2021   PT End of Session - 05/11/21 1416     Visit Number 5    Date for PT Re-Evaluation 06/29/21    PT Start Time 1400    PT Stop Time 1442    PT Time Calculation (min) 42 min    Activity Tolerance Patient tolerated treatment well    Behavior During Therapy [facility] for tasks assessed/performed             Past Medical History:  Diagnosis Date   Depression    Hyperlipemia    Osteoporosis     Past Surgical History:  Procedure Laterality Date   ABDOMINAL HYSTERECTOMY     BREAST SURGERY     ROTATOR CUFF REPAIR      There were no vitals filed for this visit.   Subjective Assessment - 05/11/21 1409     Subjective Patient reports that she has been cleaning and cooking, reports that she is very fatigued.    Currently in Pain? No/denies                [facility] PT Assessment - 05/11/21 0001       PROM   Right Knee Flexion 120                           [facility] Adult PT Treatment/Exercise - 05/11/21 0001       Knee/Hip Exercises: Aerobic   Recumbent Bike 6 minutes full revs    Nustep level 5 x 6 minutes      Knee/Hip Exercises: Machines for Strengthening   Cybex Knee Extension 5# 2x10    Cybex Knee Flexion 20# 2x10    Cybex Leg Press 20# and no weight working on flexion      Knee/Hip Exercises: Standing   Walking with Sports Cord 30# each direction, very difficult for her, needed Min A for LOB      Manual Therapy   Manual Therapy Passive ROM;Joint mobilization    Soft tissue mobilization gentle scar and tissue    Passive ROM right knee                      PT Short Term Goals - 05/04/21 1426       PT SHORT TERM GOAL #1    Title independent with initial HEP    Status Achieved               PT Long Term Goals - 05/11/21 1440       PT LONG TERM GOAL #1   Title increase AROM of right knee to 0-120 degrees flexion    Status Partially Met      PT LONG TERM GOAL #2   Title walk without device without deviation    Status Partially Met                   Plan - 05/11/21 1439     Clinical Impression Statement Patient has been very active and is doing well, reports fatigue and stiffness, but once she got on the bike she was doing well, she at times today had a little more shuffling and was using the equipment for  balance    PT Next Visit Plan continue to progress    Consulted and Agree with Plan of Care Patient             Patient will benefit from skilled therapeutic intervention in order to improve the following deficits and impairments:  Abnormal gait, Decreased range of motion, Difficulty walking, Decreased endurance, Pain, Impaired flexibility, Decreased scar mobility, Decreased balance, Decreased mobility, Decreased strength, Increased edema  Visit Diagnosis: Acute pain of right knee  Stiffness of right knee, not elsewhere classified  Difficulty in walking, not elsewhere classified  Localized edema     Problem List Patient Active Problem List   Diagnosis Date Noted   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 [place], [place], [address] Phone: [phone]   Fax:  [phone]  Name: Kim Grant MRN: [id number] Date of Birth: 1941/08/12

## 2021-05-12 DIAGNOSIS — J208 Acute bronchitis due to other specified organisms: Secondary | ICD-10-CM | POA: Diagnosis not present

## 2021-05-12 DIAGNOSIS — B9689 Other specified bacterial agents as the cause of diseases classified elsewhere: Secondary | ICD-10-CM | POA: Diagnosis not present

## 2021-05-13 ENCOUNTER — Ambulatory Visit: Payer: Medicare Other | Admitting: Physical Therapy

## 2021-05-16 ENCOUNTER — Other Ambulatory Visit: Payer: Self-pay

## 2021-05-16 ENCOUNTER — Ambulatory Visit: Payer: Medicare Other | Admitting: Physical Therapy

## 2021-05-16 ENCOUNTER — Encounter: Payer: Self-pay | Admitting: Physical Therapy

## 2021-05-16 DIAGNOSIS — M25661 Stiffness of right knee, not elsewhere classified: Secondary | ICD-10-CM

## 2021-05-16 DIAGNOSIS — R262 Difficulty in walking, not elsewhere classified: Secondary | ICD-10-CM | POA: Diagnosis not present

## 2021-05-16 DIAGNOSIS — M25561 Pain in right knee: Secondary | ICD-10-CM | POA: Diagnosis not present

## 2021-05-16 DIAGNOSIS — R600 Localized edema: Secondary | ICD-10-CM | POA: Diagnosis not present

## 2021-05-16 NOTE — Therapy (Signed)
[place]. [place], [place], [address] Phone: [phone]   Fax:  [phone]  Physical Therapy Treatment  Patient Details  Name: Kim Grant MRN: [id number] Date of Birth: 07-Nov-1940 Referring Provider (PT): Davina Poke   Encounter Date: 05/16/2021   PT End of Session - 05/16/21 1443     Visit Number 6    Date for PT Re-Evaluation 06/29/21    PT Start Time 1400    PT Stop Time 1443    PT Time Calculation (min) 43 min    Activity Tolerance Patient tolerated treatment well    Behavior During Therapy [facility] for tasks assessed/performed             Past Medical History:  Diagnosis Date   Depression    Hyperlipemia    Osteoporosis     Past Surgical History:  Procedure Laterality Date   ABDOMINAL HYSTERECTOMY     BREAST SURGERY     ROTATOR CUFF REPAIR      There were no vitals filed for this visit.   Subjective Assessment - 05/16/21 1405     Subjective I did not do much over the weekend.  Reports pain in the right posterior knee    Currently in Pain? Yes    Pain Score 1     Pain Location Knee    Pain Orientation Right;Posterior    Pain Descriptors / Indicators Aching;Sore                               [facility] Adult PT Treatment/Exercise - 05/16/21 0001       High Level Balance   High Level Balance Comments on airex head turns, eyes closed and ball toss, tried cone touches on airex, this was very diffiuclt      Knee/Hip Exercises: Stretches   Press photographer Both;3 reps;20 seconds      Knee/Hip Exercises: Aerobic   Recumbent Bike 6 minutes full revs    Nustep level 5 x 6 minutes      Knee/Hip Exercises: Machines for Strengthening   Cybex Knee Extension 5# 2x10    Cybex Knee Flexion 25# 2x10    Cybex Leg Press 20# and no weight working on flexion      Knee/Hip Exercises: Standing   Walking with Sports Cord 30# each direction, very difficult for her, needed Min A for LOB                       PT Short Term Goals - 05/04/21 1426       PT SHORT TERM GOAL #1   Title independent with initial HEP    Status Achieved               PT Long Term Goals - 05/16/21 1445       PT LONG TERM GOAL #1   Title increase AROM of right knee to 0-120 degrees flexion    Status Partially Met      PT LONG TERM GOAL #2   Title walk without device without deviation    Status Partially Met                   Plan - 05/16/21 1443     Clinical Impression Statement Patient had c/o of posterior knee pain, non tender, I had her try a gastroc stretch and this was very tight and seemed to reproduce  her pain.  Had her add to HEP.  High level balance stuff does bother her, she was better on the resisted gait but still needs a lot of close CGA, the cone touches on the airex were very difficult for her    PT Next Visit Plan continue to progress    Consulted and Agree with Plan of Care Patient             Patient will benefit from skilled therapeutic intervention in order to improve the following deficits and impairments:  Abnormal gait, Decreased range of motion, Difficulty walking, Decreased endurance, Pain, Impaired flexibility, Decreased scar mobility, Decreased balance, Decreased mobility, Decreased strength, Increased edema  Visit Diagnosis: Acute pain of right knee  Stiffness of right knee, not elsewhere classified  Difficulty in walking, not elsewhere classified  Localized edema     Problem List Patient Active Problem List   Diagnosis Date Noted   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 [place], [place], [address] Phone: [phone]   Fax:  [phone]  Name: Kim Grant MRN: [id number] Date of Birth: [birth year]

## 2021-05-18 ENCOUNTER — Encounter: Payer: Self-pay | Admitting: Physical Therapy

## 2021-05-18 ENCOUNTER — Other Ambulatory Visit: Payer: Self-pay

## 2021-05-18 ENCOUNTER — Ambulatory Visit: Payer: Medicare Other | Admitting: Physical Therapy

## 2021-05-18 DIAGNOSIS — M25661 Stiffness of right knee, not elsewhere classified: Secondary | ICD-10-CM | POA: Diagnosis not present

## 2021-05-18 DIAGNOSIS — R262 Difficulty in walking, not elsewhere classified: Secondary | ICD-10-CM

## 2021-05-18 DIAGNOSIS — M25561 Pain in right knee: Secondary | ICD-10-CM | POA: Diagnosis not present

## 2021-05-18 DIAGNOSIS — R600 Localized edema: Secondary | ICD-10-CM | POA: Diagnosis not present

## 2021-05-18 NOTE — Therapy (Signed)
[place]. [place], [place], [address] Phone: [phone]   Fax:  [phone]  Physical Therapy Treatment  Patient Details  Name: Kim Grant MRN: [id number] Date of Birth: 1941/06/23 Referring Provider (PT): Davina Poke   Encounter Date: 05/18/2021   PT End of Session - 05/18/21 1436     Visit Number 7    Date for PT Re-Evaluation 06/29/21    PT Start Time 1355    PT Stop Time 1173    PT Time Calculation (min) 42 min    Activity Tolerance Patient tolerated treatment well    Behavior During Therapy [facility] for tasks assessed/performed             Past Medical History:  Diagnosis Date   Depression    Hyperlipemia    Osteoporosis     Past Surgical History:  Procedure Laterality Date   ABDOMINAL HYSTERECTOMY     BREAST SURGERY     ROTATOR CUFF REPAIR      There were no vitals filed for this visit.   Subjective Assessment - 05/18/21 1403     Subjective I did not do the stretches, still some pain in the back of the knee, reports I just feel off    Currently in Pain? Yes    Pain Score 1     Pain Location Knee    Pain Orientation Right;Posterior    Pain Descriptors / Indicators Sore;Tightness    Aggravating Factors  walking                [facility] PT Assessment - 05/18/21 0001       AROM   Right Knee Extension 2    Right Knee Flexion 120                           [facility] Adult PT Treatment/Exercise - 05/18/21 0001       High Level Balance   High Level Balance Comments on airex head turns, eyes closed and ball toss, tried cone touches on airex, this was very diffiuclt      Knee/Hip Exercises: Stretches   Press photographer Both;3 reps;20 seconds      Knee/Hip Exercises: Aerobic   Recumbent Bike 6 minutes full revs    Nustep level 5 x 6 minutes      Knee/Hip Exercises: Machines for Strengthening   Cybex Knee Extension 5# 2x10    Cybex Knee Flexion 25# 2x10    Cybex Leg Press  20# and no weight working on flexion, 20# right                      PT Short Term Goals - 05/04/21 1426       PT SHORT TERM GOAL #1   Title independent with initial HEP    Status Achieved               PT Long Term Goals - 05/18/21 1438       PT LONG TERM GOAL #1   Title increase AROM of right knee to 0-120 degrees flexion    Status Partially Met      PT LONG TERM GOAL #2   Title walk without device without deviation    Status Partially Met      PT LONG TERM GOAL #3   Title decrease pain 50%    Status Achieved      PT LONG TERM GOAL #  4   Title go up and down stairs step over step    Status Partially Met      PT LONG TERM GOAL #5   Title independent with [place] - 05/18/21 1437     Clinical Impression Statement Patient overall is doing great, great ROM, walking without device, some small shuffling type steps especially with turns, she has difficulty with the dynamic standing balance    PT Next Visit Plan may write MD note next visit    Consulted and Agree with Plan of Care Patient             Patient will benefit from skilled therapeutic intervention in order to improve the following deficits and impairments:  Abnormal gait, Decreased range of motion, Difficulty walking, Decreased endurance, Pain, Impaired flexibility, Decreased scar mobility, Decreased balance, Decreased mobility, Decreased strength, Increased edema  Visit Diagnosis: Acute pain of right knee  Stiffness of right knee, not elsewhere classified  Difficulty in walking, not elsewhere classified     Problem List Patient Active Problem List   Diagnosis Date Noted   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 [place], [place], [address] Phone: [phone]   Fax:  [phone]  Name: Kim Grant MRN: [id number] Date of Birth: [birth year]

## 2021-05-19 ENCOUNTER — Other Ambulatory Visit: Payer: Self-pay | Admitting: *Deleted

## 2021-05-19 ENCOUNTER — Inpatient Hospital Stay: Payer: Medicare Other | Attending: Hematology & Oncology

## 2021-05-19 DIAGNOSIS — D631 Anemia in chronic kidney disease: Secondary | ICD-10-CM

## 2021-05-19 DIAGNOSIS — D509 Iron deficiency anemia, unspecified: Secondary | ICD-10-CM

## 2021-05-19 DIAGNOSIS — N189 Chronic kidney disease, unspecified: Secondary | ICD-10-CM | POA: Diagnosis not present

## 2021-05-19 DIAGNOSIS — D649 Anemia, unspecified: Secondary | ICD-10-CM

## 2021-05-19 LAB — CBC WITH DIFFERENTIAL (CANCER CENTER ONLY)
Abs Immature Granulocytes: 0.03 10*3/uL (ref 0.00–0.07)
Basophils Absolute: 0.1 10*3/uL (ref 0.0–0.1)
Basophils Relative: 1 %
Eosinophils Absolute: 0.2 10*3/uL (ref 0.0–0.5)
Eosinophils Relative: 3 %
HCT: 30.9 % — ABNORMAL LOW (ref 36.0–46.0)
Hemoglobin: 10.1 g/dL — ABNORMAL LOW (ref 12.0–15.0)
Immature Granulocytes: 0 %
Lymphocytes Relative: 26 %
Lymphs Abs: 1.8 10*3/uL (ref 0.7–4.0)
MCH: 27.7 pg (ref 26.0–34.0)
MCHC: 32.7 g/dL (ref 30.0–36.0)
MCV: 84.7 fL (ref 80.0–100.0)
Monocytes Absolute: 0.5 10*3/uL (ref 0.1–1.0)
Monocytes Relative: 7 %
Neutro Abs: 4.4 10*3/uL (ref 1.7–7.7)
Neutrophils Relative %: 63 %
Platelet Count: 302 10*3/uL (ref 150–400)
RBC: 3.65 MIL/uL — ABNORMAL LOW (ref 3.87–5.11)
RDW: 14.1 % (ref 11.5–15.5)
WBC Count: 7 10*3/uL (ref 4.0–10.5)
nRBC: 0 % (ref 0.0–0.2)

## 2021-05-19 LAB — CMP (CANCER CENTER ONLY)
ALT: 13 U/L (ref 0–44)
AST: 21 U/L (ref 15–41)
Albumin: 4.2 g/dL (ref 3.5–5.0)
Alkaline Phosphatase: 65 U/L (ref 38–126)
Anion gap: 8 (ref 5–15)
BUN: 27 mg/dL — ABNORMAL HIGH (ref 8–23)
CO2: 26 mmol/L (ref 22–32)
Calcium: 10.2 mg/dL (ref 8.9–10.3)
Chloride: 99 mmol/L (ref 98–111)
Creatinine: 1.21 mg/dL — ABNORMAL HIGH (ref 0.44–1.00)
GFR, Estimated: 46 mL/min — ABNORMAL LOW (ref >60)
Glucose, Bld: 83 mg/dL (ref 70–99)
Potassium: 4.7 mmol/L (ref 3.5–5.1)
Sodium: 133 mmol/L — ABNORMAL LOW (ref 135–145)
Total Bilirubin: 0.4 mg/dL (ref 0.3–1.2)
Total Protein: 7 g/dL (ref 6.5–8.1)

## 2021-05-19 LAB — RETICULOCYTES
Immature Retic Fract: 1.8 % — ABNORMAL LOW (ref 2.3–15.9)
RBC.: 3.64 MIL/uL — ABNORMAL LOW (ref 3.87–5.11)
Retic Count, Absolute: 32.8 10*3/uL (ref 19.0–186.0)
Retic Ct Pct: 0.9 % (ref 0.4–3.1)

## 2021-05-19 LAB — SAVE SMEAR(SSMR), FOR PROVIDER SLIDE REVIEW

## 2021-05-19 LAB — FOLATE: Folate: 55.4 ng/mL (ref >5.9)

## 2021-05-19 LAB — VITAMIN B12: Vitamin B-12: 613 pg/mL (ref 180–914)

## 2021-05-19 LAB — LACTATE DEHYDROGENASE: LDH: 170 U/L (ref 98–192)

## 2021-05-20 LAB — IRON AND TIBC
Iron: 81 ug/dL (ref 41–142)
Saturation Ratios: 23 % (ref 21–57)
TIBC: 351 ug/dL (ref 236–444)
UIBC: 270 ug/dL (ref 120–384)

## 2021-05-20 LAB — FERRITIN: Ferritin: 172 ng/mL (ref 11–307)

## 2021-05-20 LAB — ERYTHROPOIETIN: Erythropoietin: 6.7 m[IU]/mL (ref 2.6–18.5)

## 2021-05-23 ENCOUNTER — Telehealth: Payer: Self-pay | Admitting: *Deleted

## 2021-05-23 NOTE — Telephone Encounter (Signed)
Per staff message 05/23/21 called and gave upcoming appointment - confirmed

## 2021-05-24 ENCOUNTER — Encounter: Payer: Self-pay | Admitting: Family

## 2021-05-24 ENCOUNTER — Encounter: Payer: Self-pay | Admitting: Physical Therapy

## 2021-05-24 ENCOUNTER — Ambulatory Visit: Payer: Medicare Other | Attending: Sports Medicine | Admitting: Physical Therapy

## 2021-05-24 ENCOUNTER — Inpatient Hospital Stay: Payer: Medicare Other

## 2021-05-24 ENCOUNTER — Other Ambulatory Visit: Payer: Self-pay

## 2021-05-24 ENCOUNTER — Inpatient Hospital Stay: Payer: Medicare Other | Attending: Hematology & Oncology | Admitting: Family

## 2021-05-24 ENCOUNTER — Telehealth: Payer: Self-pay | Admitting: *Deleted

## 2021-05-24 VITALS — BP 116/61 | HR 69 | Temp 98.2°F | Resp 18 | Ht 65.0 in | Wt 175.0 lb

## 2021-05-24 VITALS — BP 116/61 | HR 69 | Temp 98.2°F | Resp 18

## 2021-05-24 DIAGNOSIS — D631 Anemia in chronic kidney disease: Secondary | ICD-10-CM

## 2021-05-24 DIAGNOSIS — M25661 Stiffness of right knee, not elsewhere classified: Secondary | ICD-10-CM

## 2021-05-24 DIAGNOSIS — R600 Localized edema: Secondary | ICD-10-CM

## 2021-05-24 DIAGNOSIS — D538 Other specified nutritional anemias: Secondary | ICD-10-CM | POA: Diagnosis not present

## 2021-05-24 DIAGNOSIS — M25561 Pain in right knee: Secondary | ICD-10-CM | POA: Insufficient documentation

## 2021-05-24 DIAGNOSIS — R262 Difficulty in walking, not elsewhere classified: Secondary | ICD-10-CM

## 2021-05-24 DIAGNOSIS — D509 Iron deficiency anemia, unspecified: Secondary | ICD-10-CM | POA: Diagnosis not present

## 2021-05-24 MED ORDER — EPOETIN ALFA-EPBX 40000 UNIT/ML IJ SOLN
INTRAMUSCULAR | Status: AC
  Filled 2021-05-24: qty 1

## 2021-05-24 MED ORDER — EPOETIN ALFA-EPBX 40000 UNIT/ML IJ SOLN
40000.0000 [IU] | Freq: Once | INTRAMUSCULAR | Status: AC
  Administered 2021-05-24: 40000 [IU] via SUBCUTANEOUS

## 2021-05-24 NOTE — Therapy (Signed)
[place]. [place], [place], [address] Phone: [phone]   Fax:  [phone]  Physical Therapy Treatment  Patient Details  Name: Kim Grant MRN: [id number] Date of Birth: August 20, 1941 Referring Provider (PT): Davina Poke   Encounter Date: 05/24/2021   PT End of Session - 05/24/21 1136     Visit Number 8    Date for PT Re-Evaluation 06/29/21    PT Start Time 1100    PT Stop Time 1145    PT Time Calculation (min) 45 min    Activity Tolerance Patient tolerated treatment well    Behavior During Therapy [facility] for tasks assessed/performed             Past Medical History:  Diagnosis Date   Depression    Hyperlipemia    Osteoporosis     Past Surgical History:  Procedure Laterality Date   ABDOMINAL HYSTERECTOMY     BREAST SURGERY     ROTATOR CUFF REPAIR      There were no vitals filed for this visit.   Subjective Assessment - 05/24/21 1111     Subjective When can I get on my knees?  I want to clean the cabinets    Currently in Pain? No/denies                [facility] PT Assessment - 05/24/21 0001       AROM   Right Knee Extension 2    Right Knee Flexion 120      PROM   Right Knee Extension 0    Right Knee Flexion 125                           [facility] Adult PT Treatment/Exercise - 05/24/21 0001       Ambulation/Gait   Gait Comments gait outside curbs, slopes, grass, very uneven terrain, she wanted to hold onto me a few times, otherwise supervision only      Knee/Hip Exercises: Machines for Strengthening   Cybex Knee Extension 5# 2x10    Cybex Knee Flexion 25# 2x10    Cybex Leg Press 20# and no weight working on flexion, 20# right                      PT Short Term Goals - 05/04/21 1426       PT SHORT TERM GOAL #1   Title independent with initial HEP    Status Achieved               PT Long Term Goals - 05/24/21 1138       PT LONG TERM GOAL #1    Title increase AROM of right knee to 0-120 degrees flexion    Status Partially Met      PT LONG TERM GOAL #2   Title walk without device without deviation    Status Achieved      PT LONG TERM GOAL #3   Title decrease pain 50%    Status Achieved      PT LONG TERM GOAL #4   Title go up and down stairs step over step    Status Achieved      PT LONG TERM GOAL #5   Title independent with [place] - 05/24/21 1137  Clinical Impression Statement Patient is doing great, ambulating without device, most surfaces she has no issues occasionally has some feeling of off balance when getting up at times.  AROM and PROM is great.  Some difficulty with proprioception on dynamic surfaces    PT Next Visit Plan Could continue to work some on proprioception    Consulted and Agree with Plan of Care Patient             Patient will benefit from skilled therapeutic intervention in order to improve the following deficits and impairments:  Abnormal gait, Decreased range of motion, Difficulty walking, Decreased endurance, Pain, Impaired flexibility, Decreased scar mobility, Decreased balance, Decreased mobility, Decreased strength, Increased edema  Visit Diagnosis: Acute pain of right knee  Stiffness of right knee, not elsewhere classified  Difficulty in walking, not elsewhere classified  Localized edema     Problem List Patient Active Problem List   Diagnosis Date Noted   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 [place], [place], [address] Phone: [phone]   Fax:  [phone]  Name: Kim Grant MRN: [id number] Date of Birth: 06/18/1941    

## 2021-05-24 NOTE — Progress Notes (Signed)
Hematology and Oncology Follow Up Visit  Kim Grant [id number] 1940/11/14 80 y.o. 05/24/2021   Principle Diagnosis:  Erythropoietin deficiency anemia    Current Therapy:        Retacrit 40,000 units SQ for Hgb < 11   Interim History:  Kim Grant is here today for follow-up and ESA injection. She had Kim Grant right total knee replacement surgery on 04/11/2021 and has recuperated nicely.  She has minimal swelling in the knee and is doing well in PT. She has been enjoying chair yoga and going to silver sneakers. She does note some fatigue with exertion and takes a break to rest when needed.  No tenderness, numbness or tingling in Kim Grant extremities.  Kim Grant right knee incision has healed nicely. Area is clean, dry and intact.  No bleeding, bruising or petechiae.  No falls or syncope to report.  No fever, chills, n/v, cough, rash, dizziness, SOB, chest pain, palpitations, abdominal pain or changes in bowel or bladder habits.  She has a good appetite and is staying well hydrated. Kim Grant weight is stable at 175 lbs.   ECOG Performance Status: 1 - Symptomatic but completely ambulatory  Medications:  Allergies as of 05/24/2021   No Known Allergies      Medication List        Accurate as of May 24, 2021  3:51 PM. If you have any questions, ask your nurse or doctor.          calcium citrate-vitamin D 315-200 MG-UNIT tablet Commonly known as: CITRACAL+D Take 3 tablets by mouth daily.   citalopram 40 MG tablet Commonly known as: CELEXA Take 1 tablet (40 mg total) by mouth daily.   famotidine 20 MG tablet Commonly known as: PEPCID Take by mouth.   ibuprofen 200 MG tablet Commonly known as: ADVIL Take 200 mg by mouth every 6 (six) hours as needed for mild pain or moderate pain.   meclizine 25 MG tablet Commonly known as: ANTIVERT Take 1 tablet (25 mg total) by mouth 3 (three) times daily as needed for dizziness.   multivitamin with minerals Tabs tablet Take 1 tablet by mouth daily.    pravastatin 40 MG tablet Commonly known as: PRAVACHOL Take 1 tablet (40 mg total) by mouth daily.        Allergies: No Known Allergies  Past Medical History, Surgical history, Social history, and Family History were reviewed and updated.  Review of Systems: All other 10 point review of systems is negative.   Physical Exam:  height is 5\' 5"  (1.651 m) and weight is 175 lb (79.4 kg). Kim Grant oral temperature is 98.2 F (36.8 C). Kim Grant blood pressure is 116/61 and Kim Grant pulse is 69. Kim Grant respiration is 18 and oxygen saturation is 98%.   Wt Readings from Last 3 Encounters:  05/24/21 175 lb (79.4 kg)  01/05/21 172 lb (78 kg)  12/06/20 168 lb (76.2 kg)    Ocular: Sclerae unicteric, pupils equal, round and reactive to light Ear-nose-throat: Oropharynx clear, dentition fair Lymphatic: No cervical or supraclavicular adenopathy Lungs no rales or rhonchi, good excursion bilaterally Heart regular rate and rhythm, no murmur appreciated Abd soft, nontender, positive bowel sounds MSK no focal spinal tenderness, no joint edema Neuro: non-focal, well-oriented, appropriate affect Breasts: Deferred   Lab Results  Component Value Date   WBC 7.0 05/19/2021   HGB 10.1 (L) 05/19/2021   HCT 30.9 (L) 05/19/2021   MCV 84.7 05/19/2021   PLT 302 05/19/2021   Lab Results  Component Value Date  FERRITIN 172 05/19/2021   IRON 81 05/19/2021   TIBC 351 05/19/2021   UIBC 270 05/19/2021   IRONPCTSAT 23 05/19/2021   Lab Results  Component Value Date   RETICCTPCT 0.9 05/19/2021   RBC 3.65 (L) 05/19/2021   RBC 3.64 (L) 05/19/2021   No results found for: KPAFRELGTCHN, LAMBDASER, KAPLAMBRATIO No results found for: IGGSERUM, IGA, IGMSERUM No results found for: Dorene Ar, A1GS, Sondra Barges, MSPIKE, SPEI   Chemistry      Component Value Date/Time   NA 133 (L) 05/19/2021 1502   K 4.7 05/19/2021 1502   CL 99 05/19/2021 1502   CO2 26 05/19/2021 1502   BUN 27 (H) 05/19/2021  1502   CREATININE 1.21 (H) 05/19/2021 1502      Component Value Date/Time   CALCIUM 10.2 05/19/2021 1502   ALKPHOS 65 05/19/2021 1502   AST 21 05/19/2021 1502   ALT 13 05/19/2021 1502   BILITOT 0.4 05/19/2021 1502       Impression and Plan: Kim Grant is a very pleasant 80 yo caucasian female with erythropoietin deficiency anemia.  ESA given, Hgb 10.1.  Lab and injection monthly with follow-up in 2 months.  She can contact our office with any questions or concerns.   [name], NP 8/2/20223:51 PM

## 2021-05-24 NOTE — Telephone Encounter (Signed)
Per 05/24/21 los - gave patient upcoming appointments - patient confirmed - print calendar

## 2021-05-24 NOTE — Patient Instructions (Signed)
Epoetin Alfa injection What is this medication? EPOETIN ALFA (e POE e tin AL fa) helps your body make more red blood cells. This medicine is used to treat anemia caused by chronic kidney disease, cancer chemotherapy, or HIV-therapy. It may also be used before surgery if you have anemia. This medicine may be used for other purposes; ask your health care provider or pharmacist if you have questions. COMMON BRAND NAME(S): Epogen, Procrit, Retacrit What should I tell my care team before I take this medication? They need to know if you have any of these conditions: cancer heart disease high blood pressure history of blood clots history of stroke low levels of folate, iron, or vitamin B12 in the blood seizures an unusual or allergic reaction to erythropoietin, albumin, benzyl alcohol, hamster proteins, other medicines, foods, dyes, or preservatives pregnant or trying to get pregnant breast-feeding How should I use this medication? This medicine is for injection into a vein or under the skin. It is usually given by a health care professional in a hospital or clinic setting. If you get this medicine at home, you will be taught how to prepare and give this medicine. Use exactly as directed. Take your medicine at regular intervals. Do not take your medicine more often than directed. It is important that you put your used needles and syringes in a special sharps container. Do not put them in a trash can. If you do not have a sharps container, call your pharmacist or healthcare provider to get one. A special MedGuide will be given to you by the pharmacist with each prescription and refill. Be sure to read this information carefully each time. Talk to your pediatrician regarding the use of this medicine in children. While this drug may be prescribed for selected conditions, precautions do apply. Overdosage: If you think you have taken too much of this medicine contact a poison control center or emergency  room at once. NOTE: This medicine is only for you. Do not share this medicine with others. What if I miss a dose? If you miss a dose, take it as soon as you can. If it is almost time for your next dose, take only that dose. Do not take double or extra doses. What may interact with this medication? Interactions have not been studied. This list may not describe all possible interactions. Give your health care provider a list of all the medicines, herbs, non-prescription drugs, or dietary supplements you use. Also tell them if you smoke, drink alcohol, or use illegal drugs. Some items may interact with your medicine. What should I watch for while using this medication? Your condition will be monitored carefully while you are receiving this medicine. You may need blood work done while you are taking this medicine. This medicine may cause a decrease in vitamin B6. You should make sure that you get enough vitamin B6 while you are taking this medicine. Discuss the foods you eat and the vitamins you take with your health care professional. What side effects may I notice from receiving this medication? Side effects that you should report to your doctor or health care professional as soon as possible: allergic reactions like skin rash, itching or hives, swelling of the face, lips, or tongue seizures signs and symptoms of a blood clot such as breathing problems; changes in vision; chest pain; severe, sudden headache; pain, swelling, warmth in the leg; trouble speaking; sudden numbness or weakness of the face, arm or leg signs and symptoms of a stroke like   changes in vision; confusion; trouble speaking or understanding; severe headaches; sudden numbness or weakness of the face, arm or leg; trouble walking; dizziness; loss of balance or coordination Side effects that usually do not require medical attention (report to your doctor or health care professional if they continue or are  bothersome): chills cough dizziness fever headaches joint pain muscle cramps muscle pain nausea, vomiting pain, redness, or irritation at site where injected This list may not describe all possible side effects. Call your doctor for medical advice about side effects. You may report side effects to FDA at 1-800-FDA-1088. Where should I keep my medication? Keep out of the reach of children. Store in a refrigerator between 2 and 8 degrees C (36 and 46 degrees F). Do not freeze or shake. Throw away any unused portion if using a single-dose vial. Multi-dose vials can be kept in the refrigerator for up to 21 days after the initial dose. Throw away unused medicine. NOTE: This sheet is a summary. It may not cover all possible information. If you have questions about this medicine, talk to your doctor, pharmacist, or health care provider.  2022 Elsevier/Gold Standard (2017-05-18 08:35:19)  

## 2021-05-26 DIAGNOSIS — M25561 Pain in right knee: Secondary | ICD-10-CM | POA: Diagnosis not present

## 2021-05-30 ENCOUNTER — Encounter: Payer: Self-pay | Admitting: Physical Therapy

## 2021-05-30 ENCOUNTER — Other Ambulatory Visit: Payer: Self-pay

## 2021-05-30 ENCOUNTER — Ambulatory Visit: Payer: Medicare Other | Admitting: Physical Therapy

## 2021-05-30 DIAGNOSIS — R600 Localized edema: Secondary | ICD-10-CM | POA: Diagnosis not present

## 2021-05-30 DIAGNOSIS — M25661 Stiffness of right knee, not elsewhere classified: Secondary | ICD-10-CM | POA: Diagnosis not present

## 2021-05-30 DIAGNOSIS — R262 Difficulty in walking, not elsewhere classified: Secondary | ICD-10-CM | POA: Diagnosis not present

## 2021-05-30 DIAGNOSIS — M25561 Pain in right knee: Secondary | ICD-10-CM

## 2021-05-30 NOTE — Therapy (Signed)
[place]. [place], [place], [address] Phone: [phone]   Fax:  [phone]  Physical Therapy Treatment  Patient Details  Name: Kim Grant MRN: [id number] Date of Birth: July 15, 1941 Referring Provider (PT): Davina Poke   Encounter Date: 05/30/2021   PT End of Session - 05/30/21 1515     Visit Number 9    Date for PT Re-Evaluation 06/29/21    PT Start Time 1440    PT Stop Time 1520    PT Time Calculation (min) 40 min    Activity Tolerance Patient tolerated treatment well    Behavior During Therapy [facility] for tasks assessed/performed             Past Medical History:  Diagnosis Date   Depression    Hyperlipemia    Osteoporosis     Past Surgical History:  Procedure Laterality Date   ABDOMINAL HYSTERECTOMY     BREAST SURGERY     ROTATOR CUFF REPAIR      There were no vitals filed for this visit.   Subjective Assessment - 05/30/21 1444     Subjective Patient reports very tired, reports a trip yesterday and being outside in the heat    Currently in Pain? Yes    Pain Score 2     Pain Location Knee    Pain Orientation Right    Pain Descriptors / Indicators Aching    Aggravating Factors  stadning, walking                               [facility] Adult PT Treatment/Exercise - 05/30/21 0001       Knee/Hip Exercises: Aerobic   Recumbent Bike 6 minutes full revs    Nustep level 5 x 6 minutes      Knee/Hip Exercises: Machines for Strengthening   Cybex Knee Extension 10# 2x10    Cybex Knee Flexion 25# 2x10    Cybex Leg Press 20# and no weight working on flexion, 20# right      Knee/Hip Exercises: Seated   Other Seated Knee/Hip Exercises did a lot of sit to stand without UE support from various heights 19", 17.5" and 16", really needs help with the 16".      Manual Therapy   Soft tissue mobilization scar and around the patella    Passive ROM right knee                       PT Short Term Goals - 05/04/21 1426       PT SHORT TERM GOAL #1   Title independent with initial HEP    Status Achieved               PT Long Term Goals - 05/24/21 1138       PT LONG TERM GOAL #1   Title increase AROM of right knee to 0-120 degrees flexion    Status Partially Met      PT LONG TERM GOAL #2   Title walk without device without deviation    Status Achieved      PT LONG TERM GOAL #3   Title decrease pain 50%    Status Achieved      PT LONG TERM GOAL #4   Title go up and down stairs step over step    Status Achieved      PT LONG TERM GOAL #5  Title independent with RICE    Status Achieved                   Plan - 05/30/21 1516     Clinical Impression Statement Patient reports fatigue and some ache, reports that she was out in the heat alot yesterday, she wants to try squatting, she showed me what she was doing at home and I told her know she had very poor form and her knees were past her toes, I changed this to using various heights and trying to sit back and then stand , she struggled with lower heights and had some difficulty when she became fatigued    PT Next Visit Plan Could continue to work some on proprioception    Consulted and Agree with Plan of Care Patient             Patient will benefit from skilled therapeutic intervention in order to improve the following deficits and impairments:  Abnormal gait, Decreased range of motion, Difficulty walking, Decreased endurance, Pain, Impaired flexibility, Decreased scar mobility, Decreased balance, Decreased mobility, Decreased strength, Increased edema  Visit Diagnosis: Acute pain of right knee  Stiffness of right knee, not elsewhere classified  Difficulty in walking, not elsewhere classified  Localized edema     Problem List Patient Active Problem List   Diagnosis Date Noted   Erythropoietin deficiency anemia 12/08/2020   Barrett's esophagus without  dysplasia 11/25/2018   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[place]) 05/25/2017   Age-related osteoporosis without current pathological fracture 08/04/2014   Chronic bronchitis ([place]) 08/04/2014   Pure hypercholesterolemia 08/04/2014   Aortic stenosis 08/04/2014    Sumner Boast., PT 05/30/2021, 3:18 PM  [place]. [place], [place], [address] Phone: [phone]   Fax:  [phone]  Name: Kim Grant MRN: [id number] Date of Birth: 1940-12-25

## 2021-06-01 ENCOUNTER — Ambulatory Visit: Payer: Medicare Other | Admitting: Physical Therapy

## 2021-06-01 ENCOUNTER — Encounter: Payer: Self-pay | Admitting: Physical Therapy

## 2021-06-01 ENCOUNTER — Other Ambulatory Visit: Payer: Self-pay

## 2021-06-01 DIAGNOSIS — R600 Localized edema: Secondary | ICD-10-CM | POA: Diagnosis not present

## 2021-06-01 DIAGNOSIS — R262 Difficulty in walking, not elsewhere classified: Secondary | ICD-10-CM

## 2021-06-01 DIAGNOSIS — M25661 Stiffness of right knee, not elsewhere classified: Secondary | ICD-10-CM

## 2021-06-01 DIAGNOSIS — M25561 Pain in right knee: Secondary | ICD-10-CM

## 2021-06-01 NOTE — Therapy (Signed)
[place]. [place], [place], [address] Phone: [phone]   Fax:  [phone] Progress Note Reporting Period 04/28/21 to 06/01/21  See note below for Objective Data and Assessment of Progress/Goals.     Physical Therapy Treatment  Patient Details  Name: Kim Grant MRN: [id number] Date of Birth: 1941-06-07 Referring Provider (PT): Davina Poke   Encounter Date: 06/01/2021   PT End of Session - 06/01/21 1519     Visit Number 10    Date for PT Re-Evaluation 06/29/21    PT Start Time 1435    PT Stop Time 1515    PT Time Calculation (min) 40 min    Activity Tolerance Patient tolerated treatment well    Behavior During Therapy [facility] for tasks assessed/performed             Past Medical History:  Diagnosis Date   Depression    Hyperlipemia    Osteoporosis     Past Surgical History:  Procedure Laterality Date   ABDOMINAL HYSTERECTOMY     BREAST SURGERY     ROTATOR CUFF REPAIR      There were no vitals filed for this visit.   Subjective Assessment - 06/01/21 1449     Subjective "My thighs are sore"  Reports squats hurt    Currently in Pain? Yes    Pain Score 4     Pain Location Leg    Pain Orientation Right;Left;Upper    Pain Descriptors / Indicators Sore    Aggravating Factors  going from sit to stand and then back up                [facility] PT Assessment - 06/01/21 0001       AROM   Right Knee Extension 2    Right Knee Flexion 122                           [facility] Adult PT Treatment/Exercise - 06/01/21 0001       Ambulation/Gait   Gait Comments gait outside curbs, slopes, grass, very uneven terrain, she wanted to hold onto me a few times, otherwise supervision only      Knee/Hip Exercises: Aerobic   Recumbent Bike 6 minutes full revs    Nustep level 5 x 6 minutes      Knee/Hip Exercises: Supine   Short Arc Quad Sets Right;10 reps;3 sets    Short Arc Quad Sets Limitations  5#    Bridges with [employer] 2 sets;10 reps    Bridges with Cl[employer] 2 sets;10 reps    Other Supine Knee/Hip Exercises feet on ball K2C, bridges, isometric abs                      PT Short Term Goals - 05/04/21 1426       PT SHORT TERM GOAL #1   Title independent with initial HEP    Status Achieved               PT Long Term Goals - 06/01/21 1522       PT LONG TERM GOAL #1   Title increase AROM of right knee to 0-120 degrees flexion    Status Partially Met      PT LONG TERM GOAL #2   Title walk without device without deviation    Status Achieved      PT LONG TERM GOAL #3  Title decrease pain 50%    Status Achieved      PT LONG TERM GOAL #4   Title go up and down stairs step over step    Status Achieved                   Plan - 06/01/21 1520     Clinical Impression Statement Patient has a lot of soreness in the thighs from the sit to stand activtiies that we did last time, She feels that she did some on her own as well and may have over done it.  She is walking iwthout device, and minimal to no deviation, the issue that she reports is off balande at times seen by her holding on to me during gait outside    PT Next Visit Plan Could continue to work some on proprioception    Consulted and Agree with Plan of Care Patient             Patient will benefit from skilled therapeutic intervention in order to improve the following deficits and impairments:  Abnormal gait, Decreased range of motion, Difficulty walking, Decreased endurance, Pain, Impaired flexibility, Decreased scar mobility, Decreased balance, Decreased mobility, Decreased strength, Increased edema  Visit Diagnosis: Acute pain of right knee  Stiffness of right knee, not elsewhere classified  Difficulty in walking, not elsewhere classified     Problem List Patient Active Problem List   Diagnosis Date Noted   Erythropoietin deficiency anemia 12/08/2020   Barrett's  esophagus without dysplasia 11/25/2018   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[place]) 05/25/2017   Age-related osteoporosis without current pathological fracture 08/04/2014   Chronic bronchitis ([place]) 08/04/2014   Pure hypercholesterolemia 08/04/2014   Aortic stenosis 08/04/2014    Sumner Boast., PT 06/01/2021, 3:23 PM  [place]. [place], [place], [address] Phone: [phone]   Fax:  [phone]  Name: Kim Grant MRN: [id number] Date of Birth: Mar 12, 1941

## 2021-06-06 ENCOUNTER — Other Ambulatory Visit: Payer: Self-pay

## 2021-06-06 ENCOUNTER — Encounter: Payer: Self-pay | Admitting: Physical Therapy

## 2021-06-06 ENCOUNTER — Ambulatory Visit: Payer: Medicare Other | Admitting: Physical Therapy

## 2021-06-06 DIAGNOSIS — R262 Difficulty in walking, not elsewhere classified: Secondary | ICD-10-CM | POA: Diagnosis not present

## 2021-06-06 DIAGNOSIS — M25561 Pain in right knee: Secondary | ICD-10-CM

## 2021-06-06 DIAGNOSIS — R600 Localized edema: Secondary | ICD-10-CM | POA: Diagnosis not present

## 2021-06-06 DIAGNOSIS — M25661 Stiffness of right knee, not elsewhere classified: Secondary | ICD-10-CM | POA: Diagnosis not present

## 2021-06-06 NOTE — Therapy (Signed)
[place]. [place], [place], [address] Phone: [phone]   Fax:  [phone]  Physical Therapy Treatment  Patient Details  Name: Kim Grant MRN: [id number] Date of Birth: [birth year] Referring Provider (PT): Davina Poke   Encounter Date: 06/06/2021   PT End of Session - 06/06/21 1444     Visit Number 11    Date for PT Re-Evaluation 06/29/21    PT Start Time 0370    PT Stop Time 1444    PT Time Calculation (min) 46 min    Activity Tolerance Patient tolerated treatment well    Behavior During Therapy [facility] for tasks assessed/performed             Past Medical History:  Diagnosis Date   Depression    Hyperlipemia    Osteoporosis     Past Surgical History:  Procedure Laterality Date   ABDOMINAL HYSTERECTOMY     BREAST SURGERY     ROTATOR CUFF REPAIR      There were no vitals filed for this visit.   Subjective Assessment - 06/06/21 1404     Subjective Patient reports that she went to her balance class on Friday, reports that she is very sore and frustrated, I told her that I think these are just mms that are not used to doing this.    Currently in Pain? Yes    Pain Score 5     Pain Location Leg    Pain Descriptors / Indicators Sore    Aggravating Factors  just really sore, thinks it was the balance class that she went to on Friday                               [facility] Adult PT Treatment/Exercise - 06/06/21 0001       Ambulation/Gait   Gait Comments outside around the building in the back, slopes, curbs and then walking in the grass      High Level Balance   High Level Balance Activities Backward walking;Side stepping;Direction changes;Negotitating around obstacles;Negotiating over obstacles    High Level Balance Comments on airex ball toss, eyes closed, airex balance beam tandem and side stepping, on airex cone toe touches      Knee/Hip Exercises: Aerobic   Recumbent Bike 6  minutes full revs    Nustep level 5 x 6 minutes      Knee/Hip Exercises: Machines for Strengthening   Cybex Knee Extension 10# 2x10                      PT Short Term Goals - 05/04/21 1426       PT SHORT TERM GOAL #1   Title independent with initial HEP    Status Achieved               PT Long Term Goals - 06/06/21 1446       PT LONG TERM GOAL #1   Title increase AROM of right knee to 0-120 degrees flexion    Status Partially Met      PT LONG TERM GOAL #2   Title walk without device without deviation    Status Achieved                   Plan - 06/06/21 1444     Clinical Impression Statement Patient c/o LE soreness, she reports that she tried a balance class on  Friday and there was a lot of standing, it seemed to work out as we did the aerobic portion of the treatment, I also added a lot of balance and she did well difficulty with tandem walks    PT Next Visit Plan work on the proprioception    Consulted and Agree with Plan of Care Patient             Patient will benefit from skilled therapeutic intervention in order to improve the following deficits and impairments:  Abnormal gait, Decreased range of motion, Difficulty walking, Decreased endurance, Pain, Impaired flexibility, Decreased scar mobility, Decreased balance, Decreased mobility, Decreased strength, Increased edema  Visit Diagnosis: Acute pain of right knee  Stiffness of right knee, not elsewhere classified  Difficulty in walking, not elsewhere classified     Problem List Patient Active Problem List   Diagnosis Date Noted   Erythropoietin deficiency anemia 12/08/2020   Barrett's esophagus without dysplasia 11/25/2018   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[place]) 05/25/2017   Age-related osteoporosis without current pathological fracture 08/04/2014   Chronic bronchitis ([place]) 08/04/2014   Pure hypercholesterolemia 08/04/2014   Aortic stenosis 08/04/2014     Sumner Boast., PT 06/06/2021, 2:46 PM  [place]. [place], [place], [address] Phone: [phone]   Fax:  [phone]  Name: Kim Grant MRN: [id number] Date of Birth: May 17, 1941

## 2021-06-08 ENCOUNTER — Encounter: Payer: Medicare Other | Admitting: Physical Therapy

## 2021-06-13 ENCOUNTER — Ambulatory Visit: Payer: Medicare Other | Admitting: Physical Therapy

## 2021-06-13 ENCOUNTER — Other Ambulatory Visit: Payer: Self-pay

## 2021-06-13 ENCOUNTER — Encounter: Payer: Self-pay | Admitting: Physical Therapy

## 2021-06-13 DIAGNOSIS — R600 Localized edema: Secondary | ICD-10-CM | POA: Diagnosis not present

## 2021-06-13 DIAGNOSIS — M25561 Pain in right knee: Secondary | ICD-10-CM

## 2021-06-13 DIAGNOSIS — M25661 Stiffness of right knee, not elsewhere classified: Secondary | ICD-10-CM | POA: Diagnosis not present

## 2021-06-13 DIAGNOSIS — R262 Difficulty in walking, not elsewhere classified: Secondary | ICD-10-CM

## 2021-06-13 NOTE — Therapy (Signed)
[place]. [place], [place], [address] Phone: [phone]   Fax:  [phone]  Physical Therapy Treatment  Patient Details  Name: Kim Grant MRN: [id number] Date of Birth: 31-Dec-1940 Referring Provider (PT): Davina Poke   Encounter Date: 06/13/2021   PT End of Session - 06/13/21 1522     Visit Number 12    Date for PT Re-Evaluation 06/29/21    PT Start Time 1444    PT Stop Time 1525    PT Time Calculation (min) 41 min    Activity Tolerance Patient tolerated treatment well    Behavior During Therapy [facility] for tasks assessed/performed             Past Medical History:  Diagnosis Date   Depression    Hyperlipemia    Osteoporosis     Past Surgical History:  Procedure Laterality Date   ABDOMINAL HYSTERECTOMY     BREAST SURGERY     ROTATOR CUFF REPAIR      There were no vitals filed for this visit.   Subjective Assessment - 06/13/21 1446     Subjective A little more right medial knee pain    Currently in Pain? Yes    Pain Score 4     Pain Location Knee    Pain Orientation Right;Medial    Pain Descriptors / Indicators Sore    Aggravating Factors  sore with the exercise class                [facility] PT Assessment - 06/13/21 0001       AROM   Right Knee Flexion 123      PROM   Right Knee Flexion 128                           [facility] Adult PT Treatment/Exercise - 06/13/21 0001       Ambulation/Gait   Gait Comments outside around the building in the back, slopes, curbs and then walking in the grass      Knee/Hip Exercises: Aerobic   Recumbent Bike 6 minutes full revs    Nustep level 5 x 6 minutes      Knee/Hip Exercises: Seated   Sit to Sand without UE support;20 reps      Manual Therapy   Soft tissue mobilization scar and around the patella    Passive ROM right knee prone and sitting, stretch of ITB and piriformis                      PT Short Term Goals  - 05/04/21 1426       PT SHORT TERM GOAL #1   Title independent with initial HEP    Status Achieved               PT Long Term Goals - 06/13/21 1524       PT LONG TERM GOAL #1   Title increase AROM of right knee to 0-120 degrees flexion    Status Partially Met      PT LONG TERM GOAL #2   Title walk without device without deviation    Status Achieved      PT LONG TERM GOAL #3   Title decrease pain 50%    Status Achieved                   Plan - 06/13/21 1523     Clinical  Impression Statement Patient reports that she is tight, reports that she gets frustrated and that she has difficulty with crossing the right leg over to put on shoes and socks, she is tight in the piriformis and the ITB  She was able to easily cross the leg over after PT session    PT Next Visit Plan work on the proprioception flexibility    Consulted and Agree with Plan of Care Patient             Patient will benefit from skilled therapeutic intervention in order to improve the following deficits and impairments:  Abnormal gait, Decreased range of motion, Difficulty walking, Decreased endurance, Pain, Impaired flexibility, Decreased scar mobility, Decreased balance, Decreased mobility, Decreased strength, Increased edema  Visit Diagnosis: Acute pain of right knee  Stiffness of right knee, not elsewhere classified  Difficulty in walking, not elsewhere classified     Problem List Patient Active Problem List   Diagnosis Date Noted   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 [place], [place], [address] Phone: [phone]   Fax:  [phone]  Name: Kim Grant MRN: [id number] Date of Birth: May 05, 1941

## 2021-06-15 ENCOUNTER — Ambulatory Visit: Payer: Medicare Other | Admitting: Physical Therapy

## 2021-06-15 ENCOUNTER — Other Ambulatory Visit: Payer: Self-pay

## 2021-06-15 ENCOUNTER — Encounter: Payer: Self-pay | Admitting: Physical Therapy

## 2021-06-15 DIAGNOSIS — M25661 Stiffness of right knee, not elsewhere classified: Secondary | ICD-10-CM

## 2021-06-15 DIAGNOSIS — R262 Difficulty in walking, not elsewhere classified: Secondary | ICD-10-CM | POA: Diagnosis not present

## 2021-06-15 DIAGNOSIS — R600 Localized edema: Secondary | ICD-10-CM | POA: Diagnosis not present

## 2021-06-15 DIAGNOSIS — M25561 Pain in right knee: Secondary | ICD-10-CM

## 2021-06-15 NOTE — Therapy (Signed)
[facility] [address] [place], [place], [address] Phone: [phone]   Fax:  [phone]  Physical Therapy Treatment  Patient Details  Name: Kim Grant MRN: [id number] Date of Birth: 03-Jul-1941 Referring Provider (PT): Leonel Ramsay   Encounter Date: 06/15/2021   PT End of Session - 06/15/21 1515     Visit Number 13    Date for PT Re-Evaluation 06/29/21    PT Start Time 1444    PT Stop Time 1528    PT Time Calculation (min) 44 min    Activity Tolerance Patient tolerated treatment well    Behavior During Therapy [facility] for tasks assessed/performed             Past Medical History:  Diagnosis Date   Depression    Hyperlipemia    Osteoporosis     Past Surgical History:  Procedure Laterality Date   ABDOMINAL HYSTERECTOMY     BREAST SURGERY     ROTATOR CUFF REPAIR      There were no vitals filed for this visit.   Subjective Assessment - 06/15/21 1448     Subjective I was really sore in the right leg, I think it was the stretching    Currently in Pain? Yes    Pain Score 6     Pain Location Knee    Pain Orientation Right    Pain Descriptors / Indicators Sore    Aggravating Factors  the stretches                               [facility] Adult PT Treatment/Exercise - 06/15/21 0001       Ambulation/Gait   Gait Comments outside around the building in the back, slopes, curbs and then walking in the grass      High Level Balance   High Level Balance Activities Backward walking;Side stepping;Direction changes;Negotitating around obstacles;Negotiating over obstacles      Knee/Hip Exercises: Stretches   Passive Hamstring Stretch Right;4 reps;20 seconds    Piriformis Stretch Right;4 reps;20 seconds      Knee/Hip Exercises: Aerobic   Recumbent Bike 6 minutes full revs    Nustep level 5 x 6 minutes      Knee/Hip Exercises: Machines for Strengthening   Cybex Knee Extension 10# 2x10    Cybex Knee  Flexion 25# 2x10      Knee/Hip Exercises: Supine   Bridges with Ball Squeeze 2 sets;10 reps    Bridges with Clamshell 2 sets;10 reps    Other Supine Knee/Hip Exercises feet on ball K2C, bridges, isometric abs                      PT Short Term Goals - 05/04/21 1426       PT SHORT TERM GOAL #1   Title independent with initial HEP    Status Achieved               PT Long Term Goals - 06/15/21 1530       PT LONG TERM GOAL #1   Title increase AROM of right knee to 0-120 degrees flexion    Status Achieved                   Plan - 06/15/21 1526     Clinical Impression Statement Patient was very sore in the leg mms after the last treatment, she did not reach for my hand  when stepping up and down curbs, she did very well in the grass, the higher level balance stuff gives her issues and she wants to do more balance activities    PT Next Visit Plan work on the proprioception flexibility    Consulted and Agree with Plan of Care Patient             Patient will benefit from skilled therapeutic intervention in order to improve the following deficits and impairments:  Abnormal gait, Decreased range of motion, Difficulty walking, Decreased endurance, Pain, Impaired flexibility, Decreased scar mobility, Decreased balance, Decreased mobility, Decreased strength, Increased edema  Visit Diagnosis: Acute pain of right knee  Stiffness of right knee, not elsewhere classified  Difficulty in walking, not elsewhere classified  Localized edema     Problem List Patient Active Problem List   Diagnosis Date Noted   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 [place], [place], [address] Phone: [phone]   Fax:  [phone]  Name: Kim Grant MRN: [id number] Date of Birth: 1940-12-16

## 2021-06-21 ENCOUNTER — Encounter: Payer: Self-pay | Admitting: Physical Therapy

## 2021-06-21 ENCOUNTER — Ambulatory Visit: Payer: Medicare Other | Admitting: Physical Therapy

## 2021-06-21 ENCOUNTER — Other Ambulatory Visit: Payer: Self-pay

## 2021-06-21 DIAGNOSIS — R262 Difficulty in walking, not elsewhere classified: Secondary | ICD-10-CM | POA: Diagnosis not present

## 2021-06-21 DIAGNOSIS — M25561 Pain in right knee: Secondary | ICD-10-CM

## 2021-06-21 DIAGNOSIS — R600 Localized edema: Secondary | ICD-10-CM | POA: Diagnosis not present

## 2021-06-21 DIAGNOSIS — M25661 Stiffness of right knee, not elsewhere classified: Secondary | ICD-10-CM | POA: Diagnosis not present

## 2021-06-21 NOTE — Therapy (Signed)
[place]. [place], [place], [address] Phone: [phone]   Fax:  [phone]  Physical Therapy Treatment  Patient Details  Name: Kim Grant MRN: [id number] Date of Birth: June 26, 1941 Referring Provider (PT): Davina Poke   Encounter Date: 06/21/2021   PT End of Session - 06/21/21 1436     Visit Number 14    Date for PT Re-Evaluation 06/29/21    PT Start Time 9833    PT Stop Time 1438    PT Time Calculation (min) 40 min    Activity Tolerance Patient tolerated treatment well    Behavior During Therapy [facility] for tasks assessed/performed             Past Medical History:  Diagnosis Date   Depression    Hyperlipemia    Osteoporosis     Past Surgical History:  Procedure Laterality Date   ABDOMINAL HYSTERECTOMY     BREAST SURGERY     ROTATOR CUFF REPAIR      There were no vitals filed for this visit.   Subjective Assessment - 06/21/21 1402     Subjective I felt better after the last time, I have better motions    Currently in Pain? Yes    Pain Score 3     Pain Location Knee    Pain Orientation Right    Pain Descriptors / Indicators Sore    Pain Relieving Factors I like what we did last time, that helped I still feel stiff                               [facility] Adult PT Treatment/Exercise - 06/21/21 0001       High Level Balance   High Level Balance Activities Backward walking;Side stepping;Direction changes;Negotitating around obstacles;Negotiating over obstacles    High Level Balance Comments on airex cone toe taps., ball toss, walking ball toss, on airex eyes closed      Knee/Hip Exercises: Aerobic   Recumbent Bike 6 minutes level 3    Nustep level 5 x 5 minutes      Knee/Hip Exercises: Machines for Strengthening   Cybex Knee Extension 10# 2x10    Cybex Knee Flexion 25# 2x10    Cybex Leg Press 20# and no weight working on flexion, 20# right      Manual Therapy   Manual  Therapy Passive ROM;Joint mobilization    Passive ROM right knee prone and sitting, stretch of ITB and piriformis                      PT Short Term Goals - 05/04/21 1426       PT SHORT TERM GOAL #1   Title independent with initial HEP    Status Achieved               PT Long Term Goals - 06/21/21 1438       PT LONG TERM GOAL #1   Title increase AROM of right knee to 0-120 degrees flexion    Status Achieved      PT LONG TERM GOAL #2   Title walk without device without deviation    Status Achieved      PT LONG TERM GOAL #3   Title decrease pain 50%    Status Achieved      PT LONG TERM GOAL #4   Title go up and down stairs step over  step    Status Achieved      PT LONG TERM GOAL #5   Title independent with RICE    Status Achieved      PT LONG TERM GOAL #6   Title independent with advanced HEP/gym    Status Partially Met                   Plan - 06/21/21 1436     Clinical Impression Statement Patinet reports that she is doing well, less pain and feels like she is moving better.  She had diffiuclty with the airex standing, tending to lose balance to the back, she was able to step to right the LOB.  Still tight and reports feeling better after the stretss    PT Next Visit Plan work on the proprioception flexibility    Consulted and Agree with Plan of Care Patient             Patient will benefit from skilled therapeutic intervention in order to improve the following deficits and impairments:  Abnormal gait, Decreased range of motion, Difficulty walking, Decreased endurance, Pain, Impaired flexibility, Decreased scar mobility, Decreased balance, Decreased mobility, Decreased strength, Increased edema  Visit Diagnosis: Acute pain of right knee  Stiffness of right knee, not elsewhere classified  Difficulty in walking, not elsewhere classified     Problem List Patient Active Problem List   Diagnosis Date Noted   Erythropoietin  deficiency anemia 12/08/2020   Barrett's esophagus without dysplasia 11/25/2018   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[place]) 05/25/2017   Age-related osteoporosis without current pathological fracture 08/04/2014   Chronic bronchitis ([place]) 08/04/2014   Pure hypercholesterolemia 08/04/2014   Aortic stenosis 08/04/2014    Sumner Boast., PT 06/21/2021, 2:40 PM  [place]. [place], [place], [address] Phone: [phone]   Fax:  [phone]  Name: Enya Bureau MRN: [id number] Date of Birth: 1941-09-13

## 2021-06-24 ENCOUNTER — Inpatient Hospital Stay: Payer: Medicare Other | Attending: Hematology & Oncology

## 2021-06-24 ENCOUNTER — Inpatient Hospital Stay: Payer: Medicare Other

## 2021-06-24 ENCOUNTER — Other Ambulatory Visit: Payer: Self-pay

## 2021-06-24 VITALS — BP 144/63 | HR 68 | Temp 98.0°F | Resp 16

## 2021-06-24 DIAGNOSIS — N189 Chronic kidney disease, unspecified: Secondary | ICD-10-CM | POA: Diagnosis not present

## 2021-06-24 DIAGNOSIS — D631 Anemia in chronic kidney disease: Secondary | ICD-10-CM

## 2021-06-24 DIAGNOSIS — D509 Iron deficiency anemia, unspecified: Secondary | ICD-10-CM

## 2021-06-24 LAB — CBC WITH DIFFERENTIAL (CANCER CENTER ONLY)
Abs Immature Granulocytes: 0.02 10*3/uL (ref 0.00–0.07)
Basophils Absolute: 0 10*3/uL (ref 0.0–0.1)
Basophils Relative: 1 %
Eosinophils Absolute: 0.2 10*3/uL (ref 0.0–0.5)
Eosinophils Relative: 3 %
HCT: 33.3 % — ABNORMAL LOW (ref 36.0–46.0)
Hemoglobin: 10.8 g/dL — ABNORMAL LOW (ref 12.0–15.0)
Immature Granulocytes: 0 %
Lymphocytes Relative: 26 %
Lymphs Abs: 1.7 10*3/uL (ref 0.7–4.0)
MCH: 27.3 pg (ref 26.0–34.0)
MCHC: 32.4 g/dL (ref 30.0–36.0)
MCV: 84.3 fL (ref 80.0–100.0)
Monocytes Absolute: 0.6 10*3/uL (ref 0.1–1.0)
Monocytes Relative: 9 %
Neutro Abs: 3.8 10*3/uL (ref 1.7–7.7)
Neutrophils Relative %: 61 %
Platelet Count: 317 10*3/uL (ref 150–400)
RBC: 3.95 MIL/uL (ref 3.87–5.11)
RDW: 13.1 % (ref 11.5–15.5)
WBC Count: 6.3 10*3/uL (ref 4.0–10.5)
nRBC: 0 % (ref 0.0–0.2)

## 2021-06-24 LAB — CMP (CANCER CENTER ONLY)
ALT: 13 U/L (ref 0–44)
AST: 21 U/L (ref 15–41)
Albumin: 4.4 g/dL (ref 3.5–5.0)
Alkaline Phosphatase: 67 U/L (ref 38–126)
Anion gap: 7 (ref 5–15)
BUN: 21 mg/dL (ref 8–23)
CO2: 30 mmol/L (ref 22–32)
Calcium: 10.6 mg/dL — ABNORMAL HIGH (ref 8.9–10.3)
Chloride: 100 mmol/L (ref 98–111)
Creatinine: 1.04 mg/dL — ABNORMAL HIGH (ref 0.44–1.00)
GFR, Estimated: 55 mL/min — ABNORMAL LOW (ref >60)
Glucose, Bld: 92 mg/dL (ref 70–99)
Potassium: 4.4 mmol/L (ref 3.5–5.1)
Sodium: 137 mmol/L (ref 135–145)
Total Bilirubin: 0.4 mg/dL (ref 0.3–1.2)
Total Protein: 6.9 g/dL (ref 6.5–8.1)

## 2021-06-24 LAB — RETICULOCYTES
Immature Retic Fract: 4.9 % (ref 2.3–15.9)
RBC.: 3.93 MIL/uL (ref 3.87–5.11)
Retic Count, Absolute: 23.6 10*3/uL (ref 19.0–186.0)
Retic Ct Pct: 0.6 % (ref 0.4–3.1)

## 2021-06-24 MED ORDER — EPOETIN ALFA-EPBX 40000 UNIT/ML IJ SOLN
40000.0000 [IU] | Freq: Once | INTRAMUSCULAR | Status: AC
  Administered 2021-06-24: 40000 [IU] via SUBCUTANEOUS
  Filled 2021-06-24: qty 1

## 2021-06-24 NOTE — Patient Instructions (Signed)
Epoetin Alfa injection What is this medication? EPOETIN ALFA (e POE e tin AL fa) helps your body make more red blood cells. This medicine is used to treat anemia caused by chronic kidney disease, cancer chemotherapy, or HIV-therapy. It may also be used before surgery if you have anemia. This medicine may be used for other purposes; ask your health care provider or pharmacist if you have questions. COMMON BRAND NAME(S): Epogen, Procrit, Retacrit What should I tell my care team before I take this medication? They need to know if you have any of these conditions: cancer heart disease high blood pressure history of blood clots history of stroke low levels of folate, iron, or vitamin B12 in the blood seizures an unusual or allergic reaction to erythropoietin, albumin, benzyl alcohol, hamster proteins, other medicines, foods, dyes, or preservatives pregnant or trying to get pregnant breast-feeding How should I use this medication? This medicine is for injection into a vein or under the skin. It is usually given by a health care professional in a hospital or clinic setting. If you get this medicine at home, you will be taught how to prepare and give this medicine. Use exactly as directed. Take your medicine at regular intervals. Do not take your medicine more often than directed. It is important that you put your used needles and syringes in a special sharps container. Do not put them in a trash can. If you do not have a sharps container, call your pharmacist or healthcare provider to get one. A special MedGuide will be given to you by the pharmacist with each prescription and refill. Be sure to read this information carefully each time. Talk to your pediatrician regarding the use of this medicine in children. While this drug may be prescribed for selected conditions, precautions do apply. Overdosage: If you think you have taken too much of this medicine contact a poison control center or emergency  room at once. NOTE: This medicine is only for you. Do not share this medicine with others. What if I miss a dose? If you miss a dose, take it as soon as you can. If it is almost time for your next dose, take only that dose. Do not take double or extra doses. What may interact with this medication? Interactions have not been studied. This list may not describe all possible interactions. Give your health care provider a list of all the medicines, herbs, non-prescription drugs, or dietary supplements you use. Also tell them if you smoke, drink alcohol, or use illegal drugs. Some items may interact with your medicine. What should I watch for while using this medication? Your condition will be monitored carefully while you are receiving this medicine. You may need blood work done while you are taking this medicine. This medicine may cause a decrease in vitamin B6. You should make sure that you get enough vitamin B6 while you are taking this medicine. Discuss the foods you eat and the vitamins you take with your health care professional. What side effects may I notice from receiving this medication? Side effects that you should report to your doctor or health care professional as soon as possible: allergic reactions like skin rash, itching or hives, swelling of the face, lips, or tongue seizures signs and symptoms of a blood clot such as breathing problems; changes in vision; chest pain; severe, sudden headache; pain, swelling, warmth in the leg; trouble speaking; sudden numbness or weakness of the face, arm or leg signs and symptoms of a stroke like   changes in vision; confusion; trouble speaking or understanding; severe headaches; sudden numbness or weakness of the face, arm or leg; trouble walking; dizziness; loss of balance or coordination Side effects that usually do not require medical attention (report to your doctor or health care professional if they continue or are  bothersome): chills cough dizziness fever headaches joint pain muscle cramps muscle pain nausea, vomiting pain, redness, or irritation at site where injected This list may not describe all possible side effects. Call your doctor for medical advice about side effects. You may report side effects to FDA at 1-800-FDA-1088. Where should I keep my medication? Keep out of the reach of children. Store in a refrigerator between 2 and 8 degrees C (36 and 46 degrees F). Do not freeze or shake. Throw away any unused portion if using a single-dose vial. Multi-dose vials can be kept in the refrigerator for up to 21 days after the initial dose. Throw away unused medicine. NOTE: This sheet is a summary. It may not cover all possible information. If you have questions about this medicine, talk to your doctor, pharmacist, or health care provider.  2022 Elsevier/Gold Standard (2017-05-18 08:35:19)  

## 2021-06-28 LAB — FERRITIN: Ferritin: 50 ng/mL (ref 11–307)

## 2021-06-28 LAB — IRON AND TIBC
Iron: 79 ug/dL (ref 41–142)
Saturation Ratios: 22 % (ref 21–57)
TIBC: 360 ug/dL (ref 236–444)
UIBC: 281 ug/dL (ref 120–384)

## 2021-06-29 ENCOUNTER — Encounter: Payer: Self-pay | Admitting: Physical Therapy

## 2021-06-29 ENCOUNTER — Other Ambulatory Visit: Payer: Self-pay

## 2021-06-29 ENCOUNTER — Ambulatory Visit: Payer: Medicare Other | Attending: Sports Medicine | Admitting: Physical Therapy

## 2021-06-29 ENCOUNTER — Ambulatory Visit: Payer: Medicare Other | Admitting: Physical Therapy

## 2021-06-29 DIAGNOSIS — R262 Difficulty in walking, not elsewhere classified: Secondary | ICD-10-CM

## 2021-06-29 DIAGNOSIS — M25661 Stiffness of right knee, not elsewhere classified: Secondary | ICD-10-CM

## 2021-06-29 DIAGNOSIS — M25561 Pain in right knee: Secondary | ICD-10-CM | POA: Diagnosis not present

## 2021-06-29 DIAGNOSIS — R600 Localized edema: Secondary | ICD-10-CM | POA: Diagnosis not present

## 2021-06-29 NOTE — Therapy (Signed)
[place]. [place], [place], [address] Phone: [phone]   Fax:  [phone]  Physical Therapy Treatment  Patient Details  Name: Kim Grant MRN: [id number] Date of Birth: [birth year] Referring Provider (PT): Davina Poke   Encounter Date: 06/29/2021   PT End of Session - 06/29/21 1512     Visit Number 15    Date for PT Re-Evaluation 07/30/21    PT Start Time 1430    PT Stop Time 1512    PT Time Calculation (min) 42 min    Activity Tolerance Patient tolerated treatment well    Behavior During Therapy [name] for tasks assessed/performed             Past Medical History:  Diagnosis Date   Depression    Hyperlipemia    Osteoporosis     Past Surgical History:  Procedure Laterality Date   ABDOMINAL HYSTERECTOMY     BREAST SURGERY     ROTATOR CUFF REPAIR      There were no vitals filed for this visit.   Subjective Assessment - 06/29/21 1439     Subjective I was at the beauty shop today and sitting in that chair my right medial knee started hurting, just an ahe    Currently in Pain? Yes    Pain Score 5     Pain Location Knee    Pain Orientation Right;Medial    Pain Descriptors / Indicators Aching    Aggravating Factors  sitting in the beauty chair                [facility] PT Assessment - 06/29/21 0001       Assessment   Medical Diagnosis s/p right TKA    Referring Provider (PT) Davina Poke      AROM   Right Knee Extension 2    Right Knee Flexion 124                           [facility] Adult PT Treatment/Exercise - 06/29/21 0001       High Level Balance   High Level Balance Activities Backward walking;Side stepping;Direction changes;Negotitating around obstacles;Negotiating over obstacles    High Level Balance Comments on airex cone toe taps., ball toss, walking ball toss, on airex eyes closed, airex balance beam tandem walk and side stepping      Knee/Hip Exercises: Aerobic    Recumbent Bike 6 minutes level 3.5    Nustep level 5 x 5 minutes      Knee/Hip Exercises: Machines for Strengthening   Cybex Knee Extension 10# 2x10    Cybex Knee Flexion 25# 2x10                  Upper Extremity Functional Index Score :   /80     PT Short Term Goals - 05/04/21 1426       PT SHORT TERM GOAL #1   Title independent with initial HEP    Status Achieved               PT Long Term Goals - 06/29/21 1515       PT LONG TERM GOAL #1   Title increase AROM of right knee to 0-120 degrees flexion    Status Achieved      PT LONG TERM GOAL #2   Title walk without device without deviation    Status Achieved      PT LONG TERM GOAL #  3   Title decrease pain 50%    Status Achieved      PT LONG TERM GOAL #4   Status Achieved      PT LONG TERM GOAL #5   Title independent with RICE    Status Achieved      PT LONG TERM GOAL #6   Title independent with advanced HEP/gym    Status Partially Met                   Plan - 06/29/21 1513     Clinical Impression Statement We did the exercises first today, she did have on differnt shoes but really struggled with the balance activities, needing CGA with most and required multiple times of Mod A due to LOB, she tried barefoot as well, She did report that she had some increased right medial knee pain while sitting in the beauty chair today.  ROM and strength and gait are doing well, biggest issue is endurance and balance    PT Frequency 2x / week    PT Duration 4 weeks    PT Treatment/Interventions ADLs/Self Care Home Management;Cryotherapy;Gait training;Balance training;Therapeutic exercise;Therapeutic activities;Functional mobility training;Stair training;Patient/family education;Manual techniques;Vasopneumatic Device    PT Next Visit Plan work on balance and functional activities    Consulted and Agree with Plan of Care Patient             Patient will benefit from skilled therapeutic intervention  in order to improve the following deficits and impairments:  Abnormal gait, Decreased range of motion, Difficulty walking, Decreased endurance, Pain, Impaired flexibility, Decreased scar mobility, Decreased balance, Decreased mobility, Decreased strength, Increased edema  Visit Diagnosis: Acute pain of right knee - Plan: PT plan of care cert/re-cert  Stiffness of right knee, not elsewhere classified - Plan: PT plan of care cert/re-cert  Difficulty in walking, not elsewhere classified - Plan: PT plan of care cert/re-cert     Problem List Patient Active Problem List   Diagnosis Date Noted   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 [place], [place], [address] Phone: [phone]   Fax:  [phone]  Name: Kim Grant MRN: [id number] Date of Birth: 26-May-1941

## 2021-07-06 ENCOUNTER — Other Ambulatory Visit: Payer: Self-pay

## 2021-07-06 ENCOUNTER — Ambulatory Visit: Payer: Medicare Other | Admitting: Physical Therapy

## 2021-07-06 ENCOUNTER — Encounter: Payer: Self-pay | Admitting: Physical Therapy

## 2021-07-06 DIAGNOSIS — M25561 Pain in right knee: Secondary | ICD-10-CM

## 2021-07-06 DIAGNOSIS — R600 Localized edema: Secondary | ICD-10-CM

## 2021-07-06 DIAGNOSIS — R262 Difficulty in walking, not elsewhere classified: Secondary | ICD-10-CM

## 2021-07-06 DIAGNOSIS — M25661 Stiffness of right knee, not elsewhere classified: Secondary | ICD-10-CM | POA: Diagnosis not present

## 2021-07-06 NOTE — Therapy (Signed)
[place]. [place], [place], [address] Phone: [phone]   Fax:  [phone]  Physical Therapy Treatment  Patient Details  Name: Kim Grant MRN: [id number] Date of Birth: 07/15/1941 Referring Provider (PT): Davina Poke   Encounter Date: 07/06/2021   PT End of Session - 07/06/21 1526     Visit Number 16    Date for PT Re-Evaluation 07/30/21    PT Start Time 1443    PT Stop Time 1527    PT Time Calculation (min) 44 min    Activity Tolerance Patient tolerated treatment well    Behavior During Therapy [facility] for tasks assessed/performed             Past Medical History:  Diagnosis Date   Depression    Hyperlipemia    Osteoporosis     Past Surgical History:  Procedure Laterality Date   ABDOMINAL HYSTERECTOMY     BREAST SURGERY     ROTATOR CUFF REPAIR      There were no vitals filed for this visit.   Subjective Assessment - 07/06/21 1448     Subjective Still a little medial nee pain, I am feeling a little better about the balance    Currently in Pain? Yes    Pain Score 3     Pain Location Knee    Pain Orientation Right;Medial    Aggravating Factors  walking                               [facility] Adult PT Treatment/Exercise - 07/06/21 0001       Ambulation/Gait   Gait Comments outside around the building in the back 2 laps, slopes, curbs and then walking in the grass      Knee/Hip Exercises: Aerobic   Recumbent Bike 6 minutes level 3.5    Nustep level 5 x 5 minutes      Knee/Hip Exercises: Machines for Strengthening   Cybex Knee Extension 10# 2x10    Cybex Knee Flexion 25# 2x10                     PT Education - 07/06/21 1525     Education Details Went [facility] [employer] [facility] , nustep and leg extension and leg curls, how to set up and how much weight and sets and reps, went over walking program and balance    Person(s) Educated Patient    Methods  Explanation;Demonstration    Comprehension Verbalized understanding              PT Short Term Goals - 05/04/21 1426       PT SHORT TERM GOAL #1   Title independent with initial HEP    Status Achieved               PT Long Term Goals - 07/06/21 1528       PT LONG TERM GOAL #1   Title increase AROM of right knee to 0-120 degrees flexion    Status Achieved      PT LONG TERM GOAL #2   Title walk without device without deviation    Status Achieved      PT LONG TERM GOAL #3   Title decrease pain 50%    Status Achieved      PT LONG TERM GOAL #4   Title go up and down stairs step over step  Status Achieved      PT LONG TERM GOAL #5   Title independent with RICE    Status Achieved      PT LONG TERM GOAL #6   Title independent with advanced HEP/gym    Status Achieved                   Plan - 07/06/21 1526     Clinical Impression Statement Patient is doing great, ROM 0-122, strength 4+/5, gait without device.  We went over her return to gym safely and a walking and balance program.  She verbalized understanding.  Gaols are met however she has some worries    PT Next Visit Plan hold x 4 weeks and D/C with goals met unless patient calls to schedule something as she has worries about doing on her own    Consulted and Agree with Plan of Care Patient             Patient will benefit from skilled therapeutic intervention in order to improve the following deficits and impairments:  Abnormal gait, Decreased range of motion, Difficulty walking, Decreased endurance, Pain, Impaired flexibility, Decreased scar mobility, Decreased balance, Decreased mobility, Decreased strength, Increased edema  Visit Diagnosis: Acute pain of right knee  Stiffness of right knee, not elsewhere classified  Difficulty in walking, not elsewhere classified  Localized edema     Problem List Patient Active Problem List   Diagnosis Date Noted   Erythropoietin deficiency  anemia 12/08/2020   Barrett's esophagus without dysplasia 11/25/2018   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[place]) 05/25/2017   Age-related osteoporosis without current pathological fracture 08/04/2014   Chronic bronchitis ([place]) 08/04/2014   Pure hypercholesterolemia 08/04/2014   Aortic stenosis 08/04/2014    Sumner Boast, PT 07/06/2021, 3:29 PM  [place]. [place], [place], [address] Phone: [phone]   Fax:  [phone]  Name: Meris Reede MRN: [id number] Date of Birth: [birth year]

## 2021-07-26 ENCOUNTER — Encounter: Payer: Self-pay | Admitting: Family

## 2021-07-26 ENCOUNTER — Inpatient Hospital Stay: Payer: Medicare Other | Attending: Hematology & Oncology

## 2021-07-26 ENCOUNTER — Other Ambulatory Visit: Payer: Self-pay

## 2021-07-26 ENCOUNTER — Inpatient Hospital Stay: Payer: Medicare Other

## 2021-07-26 ENCOUNTER — Telehealth: Payer: Self-pay | Admitting: Family

## 2021-07-26 ENCOUNTER — Inpatient Hospital Stay (HOSPITAL_BASED_OUTPATIENT_CLINIC_OR_DEPARTMENT_OTHER): Payer: Medicare Other | Admitting: Family

## 2021-07-26 VITALS — BP 127/81 | HR 85 | Resp 18 | Wt 167.8 lb

## 2021-07-26 DIAGNOSIS — M810 Age-related osteoporosis without current pathological fracture: Secondary | ICD-10-CM | POA: Diagnosis not present

## 2021-07-26 DIAGNOSIS — N189 Chronic kidney disease, unspecified: Secondary | ICD-10-CM | POA: Insufficient documentation

## 2021-07-26 DIAGNOSIS — Z87311 Personal history of (healed) other pathological fracture: Secondary | ICD-10-CM | POA: Diagnosis not present

## 2021-07-26 DIAGNOSIS — Z7189 Other specified counseling: Secondary | ICD-10-CM | POA: Diagnosis not present

## 2021-07-26 DIAGNOSIS — D631 Anemia in chronic kidney disease: Secondary | ICD-10-CM | POA: Insufficient documentation

## 2021-07-26 DIAGNOSIS — D509 Iron deficiency anemia, unspecified: Secondary | ICD-10-CM

## 2021-07-26 LAB — CMP (CANCER CENTER ONLY)
ALT: 15 U/L (ref 0–44)
AST: 23 U/L (ref 15–41)
Albumin: 4.3 g/dL (ref 3.5–5.0)
Alkaline Phosphatase: 63 U/L (ref 38–126)
Anion gap: 8 (ref 5–15)
BUN: 22 mg/dL (ref 8–23)
CO2: 28 mmol/L (ref 22–32)
Calcium: 10.3 mg/dL (ref 8.9–10.3)
Chloride: 100 mmol/L (ref 98–111)
Creatinine: 1.03 mg/dL — ABNORMAL HIGH (ref 0.44–1.00)
GFR, Estimated: 55 mL/min — ABNORMAL LOW (ref >60)
Glucose, Bld: 109 mg/dL — ABNORMAL HIGH (ref 70–99)
Potassium: 4.7 mmol/L (ref 3.5–5.1)
Sodium: 136 mmol/L (ref 135–145)
Total Bilirubin: 0.4 mg/dL (ref 0.3–1.2)
Total Protein: 6.9 g/dL (ref 6.5–8.1)

## 2021-07-26 LAB — CBC WITH DIFFERENTIAL (CANCER CENTER ONLY)
Abs Immature Granulocytes: 0.02 10*3/uL (ref 0.00–0.07)
Basophils Absolute: 0.1 10*3/uL (ref 0.0–0.1)
Basophils Relative: 1 %
Eosinophils Absolute: 0.2 10*3/uL (ref 0.0–0.5)
Eosinophils Relative: 3 %
HCT: 35.8 % — ABNORMAL LOW (ref 36.0–46.0)
Hemoglobin: 11.5 g/dL — ABNORMAL LOW (ref 12.0–15.0)
Immature Granulocytes: 0 %
Lymphocytes Relative: 24 %
Lymphs Abs: 1.7 10*3/uL (ref 0.7–4.0)
MCH: 26 pg (ref 26.0–34.0)
MCHC: 32.1 g/dL (ref 30.0–36.0)
MCV: 81 fL (ref 80.0–100.0)
Monocytes Absolute: 0.5 10*3/uL (ref 0.1–1.0)
Monocytes Relative: 8 %
Neutro Abs: 4.4 10*3/uL (ref 1.7–7.7)
Neutrophils Relative %: 64 %
Platelet Count: 325 10*3/uL (ref 150–400)
RBC: 4.42 MIL/uL (ref 3.87–5.11)
RDW: 13.7 % (ref 11.5–15.5)
WBC Count: 6.9 10*3/uL (ref 4.0–10.5)
nRBC: 0 % (ref 0.0–0.2)

## 2021-07-26 LAB — RETICULOCYTES
Immature Retic Fract: 2.7 % (ref 2.3–15.9)
RBC.: 4.4 MIL/uL (ref 3.87–5.11)
Retic Count, Absolute: 26.4 10*3/uL (ref 19.0–186.0)
Retic Ct Pct: 0.6 % (ref 0.4–3.1)

## 2021-07-26 NOTE — Telephone Encounter (Signed)
Scheduled appt per 10/4 los - gave patient AVS with updated appts.

## 2021-07-26 NOTE — Progress Notes (Signed)
Hematology and Oncology Follow Up Visit  Kim Grant [id number] 18-Apr-1941 80 y.o. 07/26/2021   Principle Diagnosis:  Erythropoietin deficiency anemia    Current Therapy:        Retacrit 40,000 units SQ for Hgb < 11   Interim History:  Kim Grant is here today for follow-up. She is doing well and has recuperated from her right knee replacement surgery nicely.  She has no swelling or tenderness but does note some numbness around the surgical site.  She is exercising regularly. She was recently out for a walk and stepped into a hole. She fell but thankfully was not seriously injured.  No syncope to report.  She has had no issues with blood loss. No bruising or petechiae.  No fever, chills, n/v, cough, rash, dizziness, SOB, chest pain, palpitations, abdominal pain or changes in bowel or bladder habits.  She has a good appetite and is staying well hydrated. Her weight is stable at 167 lbs.   ECOG Performance Status: 1 - Symptomatic but completely ambulatory  Medications:  Allergies as of 07/26/2021   No Known Allergies      Medication List        Accurate as of July 26, 2021  3:58 PM. If you have any questions, ask your nurse or doctor.          calcium citrate-vitamin D 315-200 MG-UNIT tablet Commonly known as: CITRACAL+D Take 3 tablets by mouth daily.   citalopram 40 MG tablet Commonly known as: CELEXA Take 1 tablet (40 mg total) by mouth daily.   denosumab 60 MG/ML Sosy injection Commonly known as: PROLIA Inject 60 mg into the skin every 6 (six) months.   famotidine 20 MG tablet Commonly known as: PEPCID Take by mouth.   ibuprofen 200 MG tablet Commonly known as: ADVIL Take 200 mg by mouth every 6 (six) hours as needed for mild pain or moderate pain.   meclizine 25 MG tablet Commonly known as: ANTIVERT Take 1 tablet (25 mg total) by mouth 3 (three) times daily as needed for dizziness.   melatonin 5 MG Tabs Take 5 mg by mouth at bedtime as needed.    multivitamin with minerals Tabs tablet Take 1 tablet by mouth daily.   pravastatin 40 MG tablet Commonly known as: PRAVACHOL Take 1 tablet (40 mg total) by mouth daily.        Allergies: No Known Allergies  Past Medical History, Surgical history, Social history, and Family History were reviewed and updated.  Review of Systems: All other 10 point review of systems is negative.   Physical Exam:  weight is 167 lb 12.8 oz (76.1 kg). Her blood pressure is 127/81 and her pulse is 85. Her respiration is 18 and oxygen saturation is 98%.   Wt Readings from Last 3 Encounters:  07/26/21 167 lb 12.8 oz (76.1 kg)  05/24/21 175 lb (79.4 kg)  01/05/21 172 lb (78 kg)    Ocular: Sclerae unicteric, pupils equal, round and reactive to light Ear-nose-throat: Oropharynx clear, dentition fair Lymphatic: No cervical or supraclavicular adenopathy Lungs no rales or rhonchi, good excursion bilaterally Heart regular rate and rhythm, no murmur appreciated Abd soft, nontender, positive bowel sounds MSK no focal spinal tenderness, no joint edema Neuro: non-focal, well-oriented, appropriate affect Breasts: Deferred   Lab Results  Component Value Date   WBC 6.9 07/26/2021   HGB 11.5 (L) 07/26/2021   HCT 35.8 (L) 07/26/2021   MCV 81.0 07/26/2021   PLT 325 07/26/2021   Lab Results  Component Value Date   FERRITIN 50 06/24/2021   IRON 79 06/24/2021   TIBC 360 06/24/2021   UIBC 281 06/24/2021   IRONPCTSAT 22 06/24/2021   Lab Results  Component Value Date   RETICCTPCT 0.6 07/26/2021   RBC 4.42 07/26/2021   RBC 4.40 07/26/2021   No results found for: KPAFRELGTCHN, LAMBDASER, KAPLAMBRATIO No results found for: IGGSERUM, IGA, IGMSERUM No results found for: Marda Stalker, SPEI   Chemistry      Component Value Date/Time   NA 136 07/26/2021 1411   K 4.7 07/26/2021 1411   CL 100 07/26/2021 1411   CO2 28 07/26/2021 1411   BUN 22  07/26/2021 1411   CREATININE 1.03 (H) 07/26/2021 1411      Component Value Date/Time   CALCIUM 10.3 07/26/2021 1411   ALKPHOS 63 07/26/2021 1411   AST 23 07/26/2021 1411   ALT 15 07/26/2021 1411   BILITOT 0.4 07/26/2021 1411       Impression and Plan: Kim Grant is a very pleasant 80 yo caucasian female with erythropoietin deficiency anemia.  No ESA needed, Hgb 11.5.  Iron studies are pending.  Lab check in 6 weeks with injection and follow-up in 12 weeks. She can contact our office with any questions or concerns.   Eileen Stanford, NP 10/4/20223:58 PM

## 2021-07-27 DIAGNOSIS — I359 Nonrheumatic aortic valve disorder, unspecified: Secondary | ICD-10-CM | POA: Diagnosis not present

## 2021-07-27 DIAGNOSIS — E7800 Pure hypercholesterolemia, unspecified: Secondary | ICD-10-CM | POA: Diagnosis not present

## 2021-07-27 DIAGNOSIS — I350 Nonrheumatic aortic (valve) stenosis: Secondary | ICD-10-CM | POA: Diagnosis not present

## 2021-07-27 LAB — IRON AND TIBC
Iron: 94 ug/dL (ref 41–142)
Saturation Ratios: 25 % (ref 21–57)
TIBC: 376 ug/dL (ref 236–444)
UIBC: 282 ug/dL (ref 120–384)

## 2021-07-27 LAB — FERRITIN: Ferritin: 39 ng/mL (ref 11–307)

## 2021-08-16 DIAGNOSIS — Z1231 Encounter for screening mammogram for malignant neoplasm of breast: Secondary | ICD-10-CM | POA: Diagnosis not present

## 2021-08-16 LAB — HM MAMMOGRAPHY

## 2021-09-06 ENCOUNTER — Other Ambulatory Visit: Payer: Self-pay

## 2021-09-06 ENCOUNTER — Inpatient Hospital Stay: Payer: Medicare Other | Attending: Hematology & Oncology

## 2021-09-06 ENCOUNTER — Inpatient Hospital Stay: Payer: Medicare Other

## 2021-09-06 VITALS — BP 128/70 | HR 80 | Temp 98.0°F | Resp 18

## 2021-09-06 DIAGNOSIS — N189 Chronic kidney disease, unspecified: Secondary | ICD-10-CM | POA: Diagnosis not present

## 2021-09-06 DIAGNOSIS — D631 Anemia in chronic kidney disease: Secondary | ICD-10-CM

## 2021-09-06 LAB — CMP (CANCER CENTER ONLY)
ALT: 17 U/L (ref 0–44)
AST: 23 U/L (ref 15–41)
Albumin: 4.2 g/dL (ref 3.5–5.0)
Alkaline Phosphatase: 58 U/L (ref 38–126)
Anion gap: 7 (ref 5–15)
BUN: 26 mg/dL — ABNORMAL HIGH (ref 8–23)
CO2: 28 mmol/L (ref 22–32)
Calcium: 10.5 mg/dL — ABNORMAL HIGH (ref 8.9–10.3)
Chloride: 100 mmol/L (ref 98–111)
Creatinine: 1.02 mg/dL — ABNORMAL HIGH (ref 0.44–1.00)
GFR, Estimated: 56 mL/min — ABNORMAL LOW (ref >60)
Glucose, Bld: 93 mg/dL (ref 70–99)
Potassium: 5 mmol/L (ref 3.5–5.1)
Sodium: 135 mmol/L (ref 135–145)
Total Bilirubin: 0.4 mg/dL (ref 0.3–1.2)
Total Protein: 6.9 g/dL (ref 6.5–8.1)

## 2021-09-06 LAB — CBC WITH DIFFERENTIAL (CANCER CENTER ONLY)
Abs Immature Granulocytes: 0.02 10*3/uL (ref 0.00–0.07)
Basophils Absolute: 0.1 10*3/uL (ref 0.0–0.1)
Basophils Relative: 1 %
Eosinophils Absolute: 0.6 10*3/uL — ABNORMAL HIGH (ref 0.0–0.5)
Eosinophils Relative: 9 %
HCT: 31.3 % — ABNORMAL LOW (ref 36.0–46.0)
Hemoglobin: 10.2 g/dL — ABNORMAL LOW (ref 12.0–15.0)
Immature Granulocytes: 0 %
Lymphocytes Relative: 24 %
Lymphs Abs: 1.5 10*3/uL (ref 0.7–4.0)
MCH: 26.3 pg (ref 26.0–34.0)
MCHC: 32.6 g/dL (ref 30.0–36.0)
MCV: 80.7 fL (ref 80.0–100.0)
Monocytes Absolute: 0.6 10*3/uL (ref 0.1–1.0)
Monocytes Relative: 9 %
Neutro Abs: 3.7 10*3/uL (ref 1.7–7.7)
Neutrophils Relative %: 57 %
Platelet Count: 295 10*3/uL (ref 150–400)
RBC: 3.88 MIL/uL (ref 3.87–5.11)
RDW: 15.1 % (ref 11.5–15.5)
WBC Count: 6.5 10*3/uL (ref 4.0–10.5)
nRBC: 0 % (ref 0.0–0.2)

## 2021-09-06 MED ORDER — EPOETIN ALFA-EPBX 40000 UNIT/ML IJ SOLN
40000.0000 [IU] | Freq: Once | INTRAMUSCULAR | Status: AC
  Administered 2021-09-06: 40000 [IU] via SUBCUTANEOUS
  Filled 2021-09-06: qty 1

## 2021-09-06 NOTE — Patient Instructions (Signed)
Epoetin Alfa injection °What is this medication? °EPOETIN ALFA (e POE e tin AL fa) helps your body make more red blood cells. This medicine is used to treat anemia caused by chronic kidney disease, cancer chemotherapy, or HIV-therapy. It may also be used before surgery if you have anemia. °This medicine may be used for other purposes; ask your health care provider or pharmacist if you have questions. °COMMON BRAND NAME(S): Epogen, Procrit, Retacrit °What should I tell my care team before I take this medication? °They need to know if you have any of these conditions: °cancer °heart disease °high blood pressure °history of blood clots °history of stroke °low levels of folate, iron, or vitamin B12 in the blood °seizures °an unusual or allergic reaction to erythropoietin, albumin, benzyl alcohol, hamster proteins, other medicines, foods, dyes, or preservatives °pregnant or trying to get pregnant °breast-feeding °How should I use this medication? °This medicine is for injection into a vein or under the skin. It is usually given by a health care professional in a hospital or clinic setting. °If you get this medicine at home, you will be taught how to prepare and give this medicine. Use exactly as directed. Take your medicine at regular intervals. Do not take your medicine more often than directed. °It is important that you put your used needles and syringes in a special sharps container. Do not put them in a trash can. If you do not have a sharps container, call your pharmacist or healthcare provider to get one. °A special MedGuide will be given to you by the pharmacist with each prescription and refill. Be sure to read this information carefully each time. °Talk to your pediatrician regarding the use of this medicine in children. While this drug may be prescribed for selected conditions, precautions do apply. °Overdosage: If you think you have taken too much of this medicine contact a poison control center or emergency  room at once. °NOTE: This medicine is only for you. Do not share this medicine with others. °What if I miss a dose? °If you miss a dose, take it as soon as you can. If it is almost time for your next dose, take only that dose. Do not take double or extra doses. °What may interact with this medication? °Interactions have not been studied. °This list may not describe all possible interactions. Give your health care provider a list of all the medicines, herbs, non-prescription drugs, or dietary supplements you use. Also tell them if you smoke, drink alcohol, or use illegal drugs. Some items may interact with your medicine. °What should I watch for while using this medication? °Your condition will be monitored carefully while you are receiving this medicine. °You may need blood work done while you are taking this medicine. °This medicine may cause a decrease in vitamin B6. You should make sure that you get enough vitamin B6 while you are taking this medicine. Discuss the foods you eat and the vitamins you take with your health care professional. °What side effects may I notice from receiving this medication? °Side effects that you should report to your doctor or health care professional as soon as possible: °allergic reactions like skin rash, itching or hives, swelling of the face, lips, or tongue °seizures °signs and symptoms of a blood clot such as breathing problems; changes in vision; chest pain; severe, sudden headache; pain, swelling, warmth in the leg; trouble speaking; sudden numbness or weakness of the face, arm or leg °signs and symptoms of a stroke like   changes in vision; confusion; trouble speaking or understanding; severe headaches; sudden numbness or weakness of the face, arm or leg; trouble walking; dizziness; loss of balance or coordination °Side effects that usually do not require medical attention (report to your doctor or health care professional if they continue or are  bothersome): °chills °cough °dizziness °fever °headaches °joint pain °muscle cramps °muscle pain °nausea, vomiting °pain, redness, or irritation at site where injected °This list may not describe all possible side effects. Call your doctor for medical advice about side effects. You may report side effects to FDA at 1-800-FDA-1088. °Where should I keep my medication? °Keep out of the reach of children. °Store in a refrigerator between 2 and 8 degrees C (36 and 46 degrees F). Do not freeze or shake. Throw away any unused portion if using a single-dose vial. Multi-dose vials can be kept in the refrigerator for up to 21 days after the initial dose. Throw away unused medicine. °NOTE: This sheet is a summary. It may not cover all possible information. If you have questions about this medicine, talk to your doctor, pharmacist, or health care provider. °© 2022 [employer]vier/Gold Standard (2017-06-12 00:00:00) ° °

## 2021-10-18 ENCOUNTER — Inpatient Hospital Stay: Payer: Medicare Other

## 2021-10-18 ENCOUNTER — Inpatient Hospital Stay: Payer: Medicare Other | Admitting: Family

## 2021-10-19 ENCOUNTER — Emergency Department (HOSPITAL_BASED_OUTPATIENT_CLINIC_OR_DEPARTMENT_OTHER): Payer: Medicare Other

## 2021-10-19 ENCOUNTER — Ambulatory Visit: Payer: Medicare Other

## 2021-10-19 ENCOUNTER — Encounter: Payer: Self-pay | Admitting: Family

## 2021-10-19 ENCOUNTER — Inpatient Hospital Stay: Payer: Medicare Other | Attending: Hematology & Oncology

## 2021-10-19 ENCOUNTER — Inpatient Hospital Stay: Payer: Medicare Other | Admitting: Family

## 2021-10-19 ENCOUNTER — Other Ambulatory Visit: Payer: Self-pay

## 2021-10-19 ENCOUNTER — Encounter (HOSPITAL_BASED_OUTPATIENT_CLINIC_OR_DEPARTMENT_OTHER): Payer: Self-pay | Admitting: *Deleted

## 2021-10-19 ENCOUNTER — Emergency Department (HOSPITAL_BASED_OUTPATIENT_CLINIC_OR_DEPARTMENT_OTHER)
Admission: EM | Admit: 2021-10-19 | Discharge: 2021-10-19 | Disposition: A | Discharge status: Home / Self Care | Payer: Medicare Other | Attending: Emergency Medicine | Admitting: Emergency Medicine

## 2021-10-19 VITALS — BP 138/72 | HR 63 | Temp 97.9°F | Resp 19 | Ht 65.0 in | Wt 162.8 lb

## 2021-10-19 DIAGNOSIS — D631 Anemia in chronic kidney disease: Secondary | ICD-10-CM | POA: Diagnosis not present

## 2021-10-19 DIAGNOSIS — W0110XA Fall on same level from slipping, tripping and stumbling with subsequent striking against unspecified object, initial encounter: Secondary | ICD-10-CM | POA: Diagnosis not present

## 2021-10-19 DIAGNOSIS — S0990XA Unspecified injury of head, initial encounter: Secondary | ICD-10-CM | POA: Diagnosis not present

## 2021-10-19 DIAGNOSIS — N189 Chronic kidney disease, unspecified: Secondary | ICD-10-CM

## 2021-10-19 DIAGNOSIS — S022XXA Fracture of nasal bones, initial encounter for closed fracture: Secondary | ICD-10-CM | POA: Insufficient documentation

## 2021-10-19 DIAGNOSIS — Z87891 Personal history of nicotine dependence: Secondary | ICD-10-CM | POA: Diagnosis not present

## 2021-10-19 DIAGNOSIS — D509 Iron deficiency anemia, unspecified: Secondary | ICD-10-CM | POA: Diagnosis not present

## 2021-10-19 LAB — CMP (CANCER CENTER ONLY)
ALT: 16 U/L (ref 0–44)
AST: 21 U/L (ref 15–41)
Albumin: 4 g/dL (ref 3.5–5.0)
Alkaline Phosphatase: 49 U/L (ref 38–126)
Anion gap: 8 (ref 5–15)
BUN: 22 mg/dL (ref 8–23)
CO2: 26 mmol/L (ref 22–32)
Calcium: 9.7 mg/dL (ref 8.9–10.3)
Chloride: 100 mmol/L (ref 98–111)
Creatinine: 1 mg/dL (ref 0.44–1.00)
GFR, Estimated: 57 mL/min — ABNORMAL LOW (ref >60)
Glucose, Bld: 112 mg/dL — ABNORMAL HIGH (ref 70–99)
Potassium: 4.5 mmol/L (ref 3.5–5.1)
Sodium: 134 mmol/L — ABNORMAL LOW (ref 135–145)
Total Bilirubin: 0.3 mg/dL (ref 0.3–1.2)
Total Protein: 6.5 g/dL (ref 6.5–8.1)

## 2021-10-19 LAB — CBC WITH DIFFERENTIAL (CANCER CENTER ONLY)
Abs Immature Granulocytes: 0.04 10*3/uL (ref 0.00–0.07)
Basophils Absolute: 0 10*3/uL (ref 0.0–0.1)
Basophils Relative: 1 %
Eosinophils Absolute: 0.3 10*3/uL (ref 0.0–0.5)
Eosinophils Relative: 4 %
HCT: 32.4 % — ABNORMAL LOW (ref 36.0–46.0)
Hemoglobin: 10.5 g/dL — ABNORMAL LOW (ref 12.0–15.0)
Immature Granulocytes: 1 %
Lymphocytes Relative: 23 %
Lymphs Abs: 1.6 10*3/uL (ref 0.7–4.0)
MCH: 26.9 pg (ref 26.0–34.0)
MCHC: 32.4 g/dL (ref 30.0–36.0)
MCV: 82.9 fL (ref 80.0–100.0)
Monocytes Absolute: 0.5 10*3/uL (ref 0.1–1.0)
Monocytes Relative: 7 %
Neutro Abs: 4.8 10*3/uL (ref 1.7–7.7)
Neutrophils Relative %: 64 %
Platelet Count: 267 10*3/uL (ref 150–400)
RBC: 3.91 MIL/uL (ref 3.87–5.11)
RDW: 15.4 % (ref 11.5–15.5)
WBC Count: 7.3 10*3/uL (ref 4.0–10.5)
nRBC: 0 % (ref 0.0–0.2)

## 2021-10-19 NOTE — ED Triage Notes (Signed)
C/o head injury and nose injury  x 1 week ago , cont with h/a and " foggy"  every day

## 2021-10-19 NOTE — Discharge Instructions (Addendum)
You have been seen and discharged from the emergency department.  CAT scan of your head was unremarkable.  CAT scan of your face shows depressed nasal fractures.  You need to follow-up with your ENT doctor for definitive treatment.  Continue to take Tylenol/ibuprofen as needed.  Refer to the information in this packet for concussion-like symptoms.  Rest and stay well-hydrated.  Follow-up with your primary provider for reevaluation and further care. Take home medications as prescribed. If you have any worsening symptoms or further concerns for your health please return to an emergency department for further evaluation.

## 2021-10-19 NOTE — Progress Notes (Signed)
Hematology and Oncology Follow Up Visit  Kim Grant [id number] 1941/05/21 80 y.o. 10/19/2021   Principle Diagnosis:  Erythropoietin deficiency anemia    Current Therapy:        Retacrit 40,000 units SQ for Hgb < 11   Interim History:  Kim Grant is here today for follow-up and injection. Unfortunately she tripped and fell face first and hit her head on her concrete walkway Monday.  She did not go to the ED after this happened. Her sons have checked in on her. She notes persistent headache, dizziness and increased fatigue/weakness and is questioning going to the ED for evaluation.   She has an abrasion above her right upper lip and bruising across the bridge of her nose.  She had no blood loss noted. No petechiae or abnormal bruising.  She denies loss of consciousness.  No fever, chills, n/v, cough, rash, SOB, chest pain, palpitations, abdominal pain or changes in bowel or bladder habits.  No swelling or tenderness in her extremities.  She is eating and doing her best to stay well hydrated. Her weight is stable at 162 lbs.   ECOG Performance Status: 2 - Symptomatic, <50% confined to bed  Medications:  Allergies as of 10/19/2021   No Known Allergies      Medication List        Accurate as of October 19, 2021  2:48 PM. If you have any questions, ask your nurse or doctor.          calcium citrate-vitamin D 315-200 MG-UNIT tablet Commonly known as: CITRACAL+D Take 3 tablets by mouth daily.   citalopram 40 MG tablet Commonly known as: CELEXA Take 1 tablet (40 mg total) by mouth daily.   denosumab 60 MG/ML Sosy injection Commonly known as: PROLIA Inject 60 mg into the skin every 6 (six) months.   famotidine 20 MG tablet Commonly known as: PEPCID Take by mouth.   ibuprofen 200 MG tablet Commonly known as: ADVIL Take 200 mg by mouth every 6 (six) hours as needed for mild pain or moderate pain.   meclizine 25 MG tablet Commonly known as: ANTIVERT Take 1 tablet (25  mg total) by mouth 3 (three) times daily as needed for dizziness.   melatonin 5 MG Tabs Take 5 mg by mouth at bedtime as needed.   multivitamin with minerals Tabs tablet Take 1 tablet by mouth daily.   pravastatin 40 MG tablet Commonly known as: PRAVACHOL Take 1 tablet (40 mg total) by mouth daily.        Allergies: No Known Allergies  Past Medical History, Surgical history, Social history, and Family History were reviewed and updated.  Review of Systems: All other 10 point review of systems is negative.   Physical Exam:  height is 5\' 5"  (1.651 m) and weight is 162 lb 12.8 oz (73.8 kg). Her oral temperature is 97.9 F (36.6 C). Her blood pressure is 122/108 (abnormal) and her pulse is 63. Her respiration is 19 and oxygen saturation is 100%.   Wt Readings from Last 3 Encounters:  10/19/21 162 lb 12.8 oz (73.8 kg)  07/26/21 167 lb 12.8 oz (76.1 kg)  05/24/21 175 lb (79.4 kg)    Ocular: Sclerae unicteric, pupils equal, round and reactive to light Ear-nose-throat: Oropharynx clear, dentition fair Lymphatic: No cervical or supraclavicular adenopathy Lungs no rales or rhonchi, good excursion bilaterally Heart regular rate and rhythm, no murmur appreciated Abd soft, nontender, positive bowel sounds MSK no focal spinal tenderness, no joint edema Neuro: non-focal,  well-oriented, appropriate affect Breasts: Deferred   Lab Results  Component Value Date   WBC 7.3 10/19/2021   HGB 10.5 (L) 10/19/2021   HCT 32.4 (L) 10/19/2021   MCV 82.9 10/19/2021   PLT 267 10/19/2021   Lab Results  Component Value Date   FERRITIN 39 07/26/2021   IRON 94 07/26/2021   TIBC 376 07/26/2021   UIBC 282 07/26/2021   IRONPCTSAT 25 07/26/2021   Lab Results  Component Value Date   RETICCTPCT 0.6 07/26/2021   RBC 3.91 10/19/2021   No results found for: KPAFRELGTCHN, LAMBDASER, KAPLAMBRATIO No results found for: IGGSERUM, IGA, IGMSERUM No results found for: Kathrynn Ducking, MSPIKE, SPEI   Chemistry      Component Value Date/Time   NA 134 (L) 10/19/2021 1410   K 4.5 10/19/2021 1410   CL 100 10/19/2021 1410   CO2 26 10/19/2021 1410   BUN 22 10/19/2021 1410   CREATININE 1.00 10/19/2021 1410      Component Value Date/Time   CALCIUM 9.7 10/19/2021 1410   ALKPHOS 49 10/19/2021 1410   AST 21 10/19/2021 1410   ALT 16 10/19/2021 1410   BILITOT 0.3 10/19/2021 1410       Impression and Plan: Kim Grant is a very pleasant 80 yo caucasian female with erythropoietin deficiency anemia.  No ESA given today.  Patient taken to the ED for further eval post fall.  Follow-up in 6 weeks.   Lottie Dawson, NP 12/28/20222:48 PM

## 2021-10-19 NOTE — ED Provider Notes (Signed)
[facility] EMERGENCY DEPARTMENT Provider Note   CSN: [id number] Arrival date & time: 10/19/21  1512     History Chief Complaint  Patient presents with   Head Injury    Kim Grant is a 80 y.o. female.  HPI  Is a 80 year old female with past medical history of HLD presents emergency department after mechanical fall.  This fall happened about 9 days ago.  Was mechanical, in her driveway.  She states that she tripped and fell forward and hit the front part of her face.  Did not catch herself with her upper extremities, denies any other extremity injury.  Denies any neck pain.  Over the past week she has been experiencing generalized headaches, mild nausea, difficulty with sleep and feeling like she is in a fog.  Denies any vision changes, epistaxis, bleeding from the ears.  She did have a bruised upper lip but denies any injury to her dentition, tongue.  She is otherwise been in her baseline health.  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     Family History  Problem Relation Age of Onset   Cancer Mother        ovarian    Heart disease Father     Social History   Tobacco Use   Smoking status: Former    Types: Cigarettes    Quit date: 02/28/2013    Years since quitting: 8.6   Smokeless tobacco: Never  Vaping Use   Vaping Use: Never used   Substance Use Topics   Alcohol use: No   Drug use: No    Home Medications Prior to Admission medications   Medication Sig Start Date End Date Taking? Authorizing Provider  calcium citrate-vitamin D (CITRACAL+D) 315-200 MG-UNIT per tablet Take 3 tablets by mouth daily.    [provider]  citalopram (CELEXA) 40 MG tablet Take 1 tablet (40 mg total) by mouth daily. 11/18/20   Cirigliano, Garvin Fila, DO  denosumab (PROLIA) 60 MG/ML SOSY injection Inject 60 mg into the skin every 6 (six) months.    [provider]  famotidine (PEPCID) 20 MG tablet Take by mouth. 01/22/17 11/18/20  [provider]  ibuprofen (ADVIL,MOTRIN) 200 MG tablet Take 200 mg by mouth every 6 (six) hours as needed for mild pain or moderate pain.    [provider]  meclizine (ANTIVERT) 25 MG tablet Take 1 tablet (25 mg total) by mouth 3 (three) times daily as needed for dizziness. 04/04/14   Veryl Speak, MD  melatonin 5 MG TABS Take 5 mg by mouth at bedtime as needed.    [provider]  Multiple Vitamin (MULTIVITAMIN WITH MINERALS) TABS tablet Take 1 tablet by mouth daily.    [provider]  pravastatin (PRAVACHOL) 40 MG tablet Take 1 tablet (40 mg total) by mouth daily. 11/18/20  Ronnald Nian, DO    Allergies    Patient has no known allergies.  Review of Systems   Review of Systems  Constitutional:  Positive for fatigue. Negative for chills and fever.  HENT:  Negative for congestion.   Eyes:  Negative for visual disturbance.  Respiratory:  Negative for shortness of breath.   Cardiovascular:  Negative for chest pain.  Gastrointestinal:  Negative for abdominal pain, diarrhea and vomiting.  Musculoskeletal:  Negative for back pain and neck pain.  Skin:  Negative for rash.  Neurological:  Positive for headaches.  Psychiatric/Behavioral:  Positive for confusion.    Physical Exam Updated Vital Signs BP (!) 161/73 (BP Location: Right Arm)    Pulse 61    Temp  97.8 F (36.6 C) (Oral)    Resp 16    Ht 5\' 5"  (1.651 m)    Wt 73.5 kg    SpO2 99%    BMI 26.96 kg/m   Physical Exam Vitals and nursing note reviewed.  Constitutional:      General: She is not in acute distress.    Appearance: Normal appearance.  HENT:     Head: Normocephalic.     Comments: Right frontal bruising    Right Ear: External ear normal.     Left Ear: External ear normal.     Nose:     Comments: Nasal bridge swelling and bruising    Mouth/Throat:     Mouth: Mucous membranes are moist.  Eyes:     Pupils: Pupils are equal, round, and reactive to light.  Cardiovascular:     Rate and Rhythm: Normal rate.  Pulmonary:     Effort: Pulmonary effort is normal. No respiratory distress.  Abdominal:     Palpations: Abdomen is soft.     Tenderness: There is no abdominal tenderness.  Musculoskeletal:        General: No swelling or deformity.     Cervical back: No rigidity or tenderness.  Skin:    General: Skin is warm.  Neurological:     Mental Status: She is alert and oriented to person, place, and time. Mental status is at baseline.  Psychiatric:        Mood and Affect: Mood normal.    ED Results / Procedures / Treatments   Labs (all labs ordered are listed, but only abnormal results are displayed) Labs Reviewed - No data to display  EKG None  Radiology CT Head Wo Contrast  Result Date: 10/19/2021 CLINICAL DATA:  Blunt facial trauma. Head injury in nose injury 1 week ago. Headache and foggy feeling. Head trauma. EXAM: CT HEAD WITHOUT CONTRAST CT MAXILLOFACIAL WITHOUT CONTRAST TECHNIQUE: Multidetector CT imaging of the head and maxillofacial structures were performed using the standard protocol without intravenous contrast. Multiplanar CT image reconstructions of the maxillofacial structures were also generated. COMPARISON:  MRI brain 04/04/2014 FINDINGS: CT HEAD FINDINGS Brain: Diffuse cerebral atrophy. Ventricular dilatation consistent with central atrophy.  Low-attenuation changes in the deep white matter consistent with small vessel ischemia. No abnormal extra-axial fluid collections. No mass effect or midline shift. Gray-white matter junctions are distinct. Basal cisterns are not effaced. No acute intracranial hemorrhage. Vascular: Mild intracranial arterial vascular calcifications. Skull: Calvarium appears intact. Other: Paranasal sinuses and mastoid air cells are clear. CT MAXILLOFACIAL FINDINGS Osseous: Mildly depressed nasal bone fractures anteriorly. No acute displaced orbital, facial, or mandibular fractures are demonstrated. Degenerative changes in the temporomandibular joints. Orbits: The globes and extraocular muscles appear intact and symmetrical. Sinuses: Paranasal  sinuses and mastoid air cells are clear. Soft tissues: No abnormal soft tissue swelling or hematoma. Other: Incidental note of anterior subluxation of C3 on C4 of about 4 mm. Degenerative changes are present in the cervical spine in this may be degenerative although in the setting of trauma, ligamentous injury or acute injury are not excluded. Correlate with physical examination. IMPRESSION: 1. No acute intracranial abnormalities. Chronic atrophy and small vessel ischemic changes. 2. Mildly depressed nasal bone fractures. Facial bones are otherwise intact. 3. Degenerative changes in the temporomandibular joints. 4. Mild anterior subluxation of C3 on C4 is nonspecific. This may be degenerative but acute injury or ligamentous changes not excluded in the setting trauma. Correlation with physical examination is recommended. Electronically Signed   By: Lucienne Capers M.D.   On: 10/19/2021 16:24   CT Maxillofacial Wo Contrast  Result Date: 10/19/2021 CLINICAL DATA:  Blunt facial trauma. Head injury in nose injury 1 week ago. Headache and foggy feeling. Head trauma. EXAM: CT HEAD WITHOUT CONTRAST CT MAXILLOFACIAL WITHOUT CONTRAST TECHNIQUE: Multidetector CT imaging of the head and maxillofacial  structures were performed using the standard protocol without intravenous contrast. Multiplanar CT image reconstructions of the maxillofacial structures were also generated. COMPARISON:  MRI brain 04/04/2014 FINDINGS: CT HEAD FINDINGS Brain: Diffuse cerebral atrophy. Ventricular dilatation consistent with central atrophy. Low-attenuation changes in the deep white matter consistent with small vessel ischemia. No abnormal extra-axial fluid collections. No mass effect or midline shift. Gray-white matter junctions are distinct. Basal cisterns are not effaced. No acute intracranial hemorrhage. Vascular: Mild intracranial arterial vascular calcifications. Skull: Calvarium appears intact. Other: Paranasal sinuses and mastoid air cells are clear. CT MAXILLOFACIAL FINDINGS Osseous: Mildly depressed nasal bone fractures anteriorly. No acute displaced orbital, facial, or mandibular fractures are demonstrated. Degenerative changes in the temporomandibular joints. Orbits: The globes and extraocular muscles appear intact and symmetrical. Sinuses: Paranasal sinuses and mastoid air cells are clear. Soft tissues: No abnormal soft tissue swelling or hematoma. Other: Incidental note of anterior subluxation of C3 on C4 of about 4 mm. Degenerative changes are present in the cervical spine in this may be degenerative although in the setting of trauma, ligamentous injury or acute injury are not excluded. Correlate with physical examination. IMPRESSION: 1. No acute intracranial abnormalities. Chronic atrophy and small vessel ischemic changes. 2. Mildly depressed nasal bone fractures. Facial bones are otherwise intact. 3. Degenerative changes in the temporomandibular joints. 4. Mild anterior subluxation of C3 on C4 is nonspecific. This may be degenerative but acute injury or ligamentous changes not excluded in the setting trauma. Correlation with physical examination is recommended. Electronically Signed   By: Lucienne Capers M.D.   On:  10/19/2021 16:24    Procedures Procedures   Medications Ordered in ED Medications - No data to display  ED Course  I have reviewed the triage vital signs and the nursing notes.  Pertinent labs & imaging results that were available during my care of the patient were reviewed by me and considered in my medical decision making (see chart for details).    MDM Rules/Calculators/A&P                          80 year old female presents emergency department status post mechanical fall and face injury.  Vitals are stable.  No neck pain or decreased range of motion.  She is not anticoagulated.  Denies any other traumatic injury besides right forehead and nose.  CT of the head is unremarkable,  CT of the face shows depressed nasal bone fractures.  There is question of mild anterior subluxation of C3 on C4 but in the absence of neck pain or findings on physical exam low suspicion for acute traumatic injury.  Plan for outpatient follow-up, she already has an established ENT doctor that she will call tomorrow.  She is ambulatory, steady.  Discussed concussion symptoms and precautions.  Patient at this time appears safe and stable for discharge and will be treated as an outpatient.  Discharge plan and strict return to ED precautions discussed, patient verbalizes understanding and agreement.     Final Clinical Impression(s) / ED Diagnoses Final diagnoses:  Injury of head, initial encounter  Closed fracture of nasal bone, initial encounter    Rx / DC Orders ED Discharge Orders     None        Rozelle Logan, DO 10/19/21 1719

## 2021-10-19 NOTE — ED Notes (Signed)
PA aware of pt and orders placed for ct face and head

## 2021-10-20 ENCOUNTER — Telehealth: Payer: Self-pay | Admitting: Family

## 2021-10-20 NOTE — Telephone Encounter (Signed)
Scheduled appt per 12/28 los- mailed letter with appt date and time . Called pt and left vmail.

## 2021-11-02 DIAGNOSIS — S022XXD Fracture of nasal bones, subsequent encounter for fracture with routine healing: Secondary | ICD-10-CM | POA: Diagnosis not present

## 2021-11-02 HISTORY — DX: Fracture of nasal bones, subsequent encounter for fracture with routine healing: S02.2XXD

## 2021-11-14 DIAGNOSIS — F331 Major depressive disorder, recurrent, moderate: Secondary | ICD-10-CM | POA: Diagnosis not present

## 2021-11-14 DIAGNOSIS — Z0000 Encounter for general adult medical examination without abnormal findings: Secondary | ICD-10-CM | POA: Diagnosis not present

## 2021-11-14 DIAGNOSIS — Z9181 History of falling: Secondary | ICD-10-CM | POA: Diagnosis not present

## 2021-11-14 DIAGNOSIS — Z87891 Personal history of nicotine dependence: Secondary | ICD-10-CM | POA: Diagnosis not present

## 2021-11-14 DIAGNOSIS — Z66 Do not resuscitate: Secondary | ICD-10-CM | POA: Diagnosis not present

## 2021-11-14 DIAGNOSIS — Z90710 Acquired absence of both cervix and uterus: Secondary | ICD-10-CM | POA: Diagnosis not present

## 2021-11-14 DIAGNOSIS — Z803 Family history of malignant neoplasm of breast: Secondary | ICD-10-CM | POA: Diagnosis not present

## 2021-11-14 DIAGNOSIS — Z79899 Other long term (current) drug therapy: Secondary | ICD-10-CM | POA: Diagnosis not present

## 2021-11-25 DIAGNOSIS — Z471 Aftercare following joint replacement surgery: Secondary | ICD-10-CM | POA: Diagnosis not present

## 2021-11-25 DIAGNOSIS — Z96651 Presence of right artificial knee joint: Secondary | ICD-10-CM | POA: Diagnosis not present

## 2021-11-30 ENCOUNTER — Inpatient Hospital Stay: Payer: Medicare Other

## 2021-11-30 ENCOUNTER — Inpatient Hospital Stay: Payer: Medicare Other | Admitting: Family

## 2021-11-30 ENCOUNTER — Inpatient Hospital Stay: Payer: Medicare Other | Attending: Hematology & Oncology

## 2021-11-30 ENCOUNTER — Encounter: Payer: Self-pay | Admitting: Family

## 2021-11-30 ENCOUNTER — Other Ambulatory Visit: Payer: Self-pay

## 2021-11-30 VITALS — BP 124/62 | HR 65 | Temp 98.1°F | Resp 17 | Wt 161.1 lb

## 2021-11-30 DIAGNOSIS — N189 Chronic kidney disease, unspecified: Secondary | ICD-10-CM

## 2021-11-30 DIAGNOSIS — D631 Anemia in chronic kidney disease: Secondary | ICD-10-CM

## 2021-11-30 DIAGNOSIS — D509 Iron deficiency anemia, unspecified: Secondary | ICD-10-CM

## 2021-11-30 LAB — CMP (CANCER CENTER ONLY)
ALT: 22 U/L (ref 0–44)
AST: 28 U/L (ref 15–41)
Albumin: 4.2 g/dL (ref 3.5–5.0)
Alkaline Phosphatase: 52 U/L (ref 38–126)
Anion gap: 6 (ref 5–15)
BUN: 21 mg/dL (ref 8–23)
CO2: 29 mmol/L (ref 22–32)
Calcium: 9.7 mg/dL (ref 8.9–10.3)
Chloride: 100 mmol/L (ref 98–111)
Creatinine: 1.03 mg/dL — ABNORMAL HIGH (ref 0.44–1.00)
GFR, Estimated: 55 mL/min — ABNORMAL LOW (ref >60)
Glucose, Bld: 91 mg/dL (ref 70–99)
Potassium: 4.6 mmol/L (ref 3.5–5.1)
Sodium: 135 mmol/L (ref 135–145)
Total Bilirubin: 0.3 mg/dL (ref 0.3–1.2)
Total Protein: 6.8 g/dL (ref 6.5–8.1)

## 2021-11-30 LAB — CBC WITH DIFFERENTIAL (CANCER CENTER ONLY)
Abs Immature Granulocytes: 0.01 10*3/uL (ref 0.00–0.07)
Basophils Absolute: 0 10*3/uL (ref 0.0–0.1)
Basophils Relative: 1 %
Eosinophils Absolute: 0.2 10*3/uL (ref 0.0–0.5)
Eosinophils Relative: 3 %
HCT: 32.6 % — ABNORMAL LOW (ref 36.0–46.0)
Hemoglobin: 10.7 g/dL — ABNORMAL LOW (ref 12.0–15.0)
Immature Granulocytes: 0 %
Lymphocytes Relative: 26 %
Lymphs Abs: 1.8 10*3/uL (ref 0.7–4.0)
MCH: 27.7 pg (ref 26.0–34.0)
MCHC: 32.8 g/dL (ref 30.0–36.0)
MCV: 84.5 fL (ref 80.0–100.0)
Monocytes Absolute: 0.6 10*3/uL (ref 0.1–1.0)
Monocytes Relative: 9 %
Neutro Abs: 4.2 10*3/uL (ref 1.7–7.7)
Neutrophils Relative %: 61 %
Platelet Count: 274 10*3/uL (ref 150–400)
RBC: 3.86 MIL/uL — ABNORMAL LOW (ref 3.87–5.11)
RDW: 14.1 % (ref 11.5–15.5)
WBC Count: 6.8 10*3/uL (ref 4.0–10.5)
nRBC: 0 % (ref 0.0–0.2)

## 2021-11-30 MED ORDER — EPOETIN ALFA-EPBX 40000 UNIT/ML IJ SOLN
40000.0000 [IU] | Freq: Once | INTRAMUSCULAR | Status: AC
  Administered 2021-11-30: 40000 [IU] via SUBCUTANEOUS
  Filled 2021-11-30: qty 1

## 2021-11-30 NOTE — Progress Notes (Signed)
Hematology and Oncology Follow Up Visit  Kim Grant [id number] 1941/03/19 81 y.o. 11/30/2021   Principle Diagnosis:  Erythropoietin deficiency anemia    Current Therapy:        Retacrit 40,000 units SQ for Hgb < 11   Interim History:  Kim Grant is here today for follow-up and injection. She is doing well and has healed nicely from her fall.  Hgb is stable at 10,7, MCV 84, platelets 274 and WBC count 6.8.  No blood loss noted. No bruising or petechiae.  No fever, chills, n/v, cough, rash, dizziness, SOB, chest pain, palpitations, abdominal pain or changes in bowel or bladder habits.  She still has some tenderness and minimal swelling in the right knee from previous fall.  No new falls. No syncope to report.  She has maintained a good appetite and is staying well hydrated. Her weight is stable ay 161 lbs.   ECOG Performance Status: 1 - Symptomatic but completely ambulatory  Medications:  Allergies as of 11/30/2021   No Known Allergies      Medication List        Accurate as of November 30, 2021  2:17 PM. If you have any questions, ask your nurse or doctor.          calcium citrate-vitamin D 315-200 MG-UNIT tablet Commonly known as: CITRACAL+D Take 3 tablets by mouth daily.   citalopram 40 MG tablet Commonly known as: CELEXA Take 1 tablet (40 mg total) by mouth daily.   denosumab 60 MG/ML Sosy injection Commonly known as: PROLIA Inject 60 mg into the skin every 6 (six) months.   famotidine 20 MG tablet Commonly known as: PEPCID Take by mouth.   ibuprofen 200 MG tablet Commonly known as: ADVIL Take 200 mg by mouth every 6 (six) hours as needed for mild pain or moderate pain.   meclizine 25 MG tablet Commonly known as: ANTIVERT Take 1 tablet (25 mg total) by mouth 3 (three) times daily as needed for dizziness.   melatonin 5 MG Tabs Take 5 mg by mouth at bedtime as needed.   multivitamin with minerals Tabs tablet Take 1 tablet by mouth daily.   pravastatin 40  MG tablet Commonly known as: PRAVACHOL Take 1 tablet (40 mg total) by mouth daily.        Allergies: No Known Allergies  Past Medical History, Surgical history, Social history, and Family History were reviewed and updated.  Review of Systems: All other 10 point review of systems is negative.   Physical Exam:  weight is 161 lb 1.3 oz (73.1 kg). Her oral temperature is 98.1 F (36.7 C). Her blood pressure is 124/62 and her pulse is 65. Her respiration is 17 and oxygen saturation is 99%.   Wt Readings from Last 3 Encounters:  11/30/21 161 lb 1.3 oz (73.1 kg)  10/19/21 162 lb (73.5 kg)  10/19/21 162 lb 12.8 oz (73.8 kg)    Ocular: Sclerae unicteric, pupils equal, round and reactive to light Ear-nose-throat: Oropharynx clear, dentition fair Lymphatic: No cervical or supraclavicular adenopathy Lungs no rales or rhonchi, good excursion bilaterally Heart regular rate and rhythm, no murmur appreciated Abd soft, nontender, positive bowel sounds MSK no focal spinal tenderness, no joint edema Neuro: non-focal, well-oriented, appropriate affect Breasts: Deferred   Lab Results  Component Value Date   WBC 6.8 11/30/2021   HGB 10.7 (L) 11/30/2021   HCT 32.6 (L) 11/30/2021   MCV 84.5 11/30/2021   PLT 274 11/30/2021   Lab Results  Component  Value Date   FERRITIN 39 07/26/2021   IRON 94 07/26/2021   TIBC 376 07/26/2021   UIBC 282 07/26/2021   IRONPCTSAT 25 07/26/2021   Lab Results  Component Value Date   RETICCTPCT 0.6 07/26/2021   RBC 3.86 (L) 11/30/2021   No results found for: KPAFRELGTCHN, LAMBDASER, KAPLAMBRATIO No results found for: IGGSERUM, IGA, IGMSERUM No results found for: Kathrynn Ducking, MSPIKE, SPEI   Chemistry      Component Value Date/Time   NA 134 (L) 10/19/2021 1410   K 4.5 10/19/2021 1410   CL 100 10/19/2021 1410   CO2 26 10/19/2021 1410   BUN 22 10/19/2021 1410   CREATININE 1.00 10/19/2021 1410       Component Value Date/Time   CALCIUM 9.7 10/19/2021 1410   ALKPHOS 49 10/19/2021 1410   AST 21 10/19/2021 1410   ALT 16 10/19/2021 1410   BILITOT 0.3 10/19/2021 1410       Impression and Plan: Kim Grant is a very pleasant 81 yo caucasian female with erythropoietin deficiency anemia.  ESA given, Hgb 10.7.  Iron studies are pending.  Follow-up in 6 weeks.   Lottie Dawson, NP 2/8/20232:17 PM

## 2021-11-30 NOTE — Patient Instructions (Signed)
Epoetin Alfa injection °What is this medication? °EPOETIN ALFA (e POE e tin AL fa) helps your body make more red blood cells. This medicine is used to treat anemia caused by chronic kidney disease, cancer chemotherapy, or HIV-therapy. It may also be used before surgery if you have anemia. °This medicine may be used for other purposes; ask your health care provider or pharmacist if you have questions. °COMMON BRAND NAME(S): Epogen, Procrit, Retacrit °What should I tell my care team before I take this medication? °They need to know if you have any of these conditions: °cancer °heart disease °high blood pressure °history of blood clots °history of stroke °low levels of folate, iron, or vitamin B12 in the blood °seizures °an unusual or allergic reaction to erythropoietin, albumin, benzyl alcohol, hamster proteins, other medicines, foods, dyes, or preservatives °pregnant or trying to get pregnant °breast-feeding °How should I use this medication? °This medicine is for injection into a vein or under the skin. It is usually given by a health care professional in a hospital or clinic setting. °If you get this medicine at home, you will be taught how to prepare and give this medicine. Use exactly as directed. Take your medicine at regular intervals. Do not take your medicine more often than directed. °It is important that you put your used needles and syringes in a special sharps container. Do not put them in a trash can. If you do not have a sharps container, call your pharmacist or healthcare provider to get one. °A special MedGuide will be given to you by the pharmacist with each prescription and refill. Be sure to read this information carefully each time. °Talk to your pediatrician regarding the use of this medicine in children. While this drug may be prescribed for selected conditions, precautions do apply. °Overdosage: If you think you have taken too much of this medicine contact a poison control center or emergency  room at once. °NOTE: This medicine is only for you. Do not share this medicine with others. °What if I miss a dose? °If you miss a dose, take it as soon as you can. If it is almost time for your next dose, take only that dose. Do not take double or extra doses. °What may interact with this medication? °Interactions have not been studied. °This list may not describe all possible interactions. Give your health care provider a list of all the medicines, herbs, non-prescription drugs, or dietary supplements you use. Also tell them if you smoke, drink alcohol, or use illegal drugs. Some items may interact with your medicine. °What should I watch for while using this medication? °Your condition will be monitored carefully while you are receiving this medicine. °You may need blood work done while you are taking this medicine. °This medicine may cause a decrease in vitamin B6. You should make sure that you get enough vitamin B6 while you are taking this medicine. Discuss the foods you eat and the vitamins you take with your health care professional. °What side effects may I notice from receiving this medication? °Side effects that you should report to your doctor or health care professional as soon as possible: °allergic reactions like skin rash, itching or hives, swelling of the face, lips, or tongue °seizures °signs and symptoms of a blood clot such as breathing problems; changes in vision; chest pain; severe, sudden headache; pain, swelling, warmth in the leg; trouble speaking; sudden numbness or weakness of the face, arm or leg °signs and symptoms of a stroke like   changes in vision; confusion; trouble speaking or understanding; severe headaches; sudden numbness or weakness of the face, arm or leg; trouble walking; dizziness; loss of balance or coordination °Side effects that usually do not require medical attention (report to your doctor or health care professional if they continue or are  bothersome): °chills °cough °dizziness °fever °headaches °joint pain °muscle cramps °muscle pain °nausea, vomiting °pain, redness, or irritation at site where injected °This list may not describe all possible side effects. Call your doctor for medical advice about side effects. You may report side effects to FDA at 1-800-FDA-1088. °Where should I keep my medication? °Keep out of the reach of children. °Store in a refrigerator between 2 and 8 degrees C (36 and 46 degrees F). Do not freeze or shake. Throw away any unused portion if using a single-dose vial. Multi-dose vials can be kept in the refrigerator for up to 21 days after the initial dose. Throw away unused medicine. °NOTE: This sheet is a summary. It may not cover all possible information. If you have questions about this medicine, talk to your doctor, pharmacist, or health care provider. °© 2022 [employer]vier/Gold Standard (2017-06-12 00:00:00) ° °

## 2021-12-01 LAB — IRON AND IRON BINDING CAPACITY (CC-WL,HP ONLY)
Iron: 62 ug/dL (ref 28–170)
Saturation Ratios: 16 % (ref 10.4–31.8)
TIBC: 385 ug/dL (ref 250–450)
UIBC: 323 ug/dL (ref 148–442)

## 2021-12-01 LAB — FERRITIN: Ferritin: 71 ng/mL (ref 11–307)

## 2022-01-31 DIAGNOSIS — M810 Age-related osteoporosis without current pathological fracture: Secondary | ICD-10-CM | POA: Diagnosis not present

## 2022-01-31 DIAGNOSIS — Z7189 Other specified counseling: Secondary | ICD-10-CM | POA: Diagnosis not present

## 2022-01-31 DIAGNOSIS — Z87311 Personal history of (healed) other pathological fracture: Secondary | ICD-10-CM | POA: Diagnosis not present

## 2022-02-02 ENCOUNTER — Ambulatory Visit: Payer: Medicare Other | Attending: Orthopedic Surgery | Admitting: Physical Therapy

## 2022-02-02 ENCOUNTER — Encounter: Payer: Self-pay | Admitting: Physical Therapy

## 2022-02-02 DIAGNOSIS — M6281 Muscle weakness (generalized): Secondary | ICD-10-CM | POA: Insufficient documentation

## 2022-02-02 DIAGNOSIS — R296 Repeated falls: Secondary | ICD-10-CM | POA: Diagnosis not present

## 2022-02-02 DIAGNOSIS — R262 Difficulty in walking, not elsewhere classified: Secondary | ICD-10-CM | POA: Insufficient documentation

## 2022-02-02 DIAGNOSIS — M25661 Stiffness of right knee, not elsewhere classified: Secondary | ICD-10-CM | POA: Diagnosis not present

## 2022-02-02 NOTE — Therapy (Signed)
[place] ?[facility] ?[address] ?[place], [place], [address] ?Phone: [phone]   Fax:  [phone] ? ?Physical Therapy Evaluation ? ?Patient Details  ?Name: Kim Grant ?MRN: [id number] ?Date of Birth: 20-Jan-1941 ?Referring Provider (PT): Yvonna Alanis ? ? ?Encounter Date: 02/02/2022 ? ? PT End of Session - 02/02/22 1714   ? ? Visit Number 1   ? Date for PT Re-Evaluation 05/04/22   ? Authorization Type BCBS MC   ? PT Start Time 1440   ? PT Stop Time 1530   ? PT Time Calculation (min) 50 min   ? Activity Tolerance Patient tolerated treatment well   ? Behavior During Therapy [facility] for tasks assessed/performed   ? ?  ?  ? ?  ? ? ?Past Medical History:  ?Diagnosis Date  ? Depression   ? Hyperlipemia   ? Osteoporosis   ? ? ?Past Surgical History:  ?Procedure Laterality Date  ? ABDOMINAL HYSTERECTOMY    ? BREAST SURGERY    ? ROTATOR CUFF REPAIR    ? ? ?There were no vitals filed for this visit. ? ? ? Subjective Assessment - 02/02/22 1446   ? ? Subjective Patient reports that over the past 6 months she has had at least two falls.  She reports that she really has been more and more afraid to walk, she reports both falls she could not get up and had to have someone come and help her.  She reports that she feels like she really did not react and try to catch herself   ? Limitations Lifting;Standing;Walking;House hold activities   ? Patient Stated Goals feel stronger, feel more steady   ? Currently in Pain? No/denies   ? Pain Score 0-No pain   ? Pain Location Knee   ? Pain Orientation Right   ? Pain Descriptors / Indicators Aching   ? Pain Type Chronic pain   ? Pain Onset More than a month ago   ? Pain Frequency Intermittent   ? 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 Pain Relieving Factors rest   ? Effect of Pain on Daily Activities sometimes diffiuclty with ADL's   ? ?  ?  ? ?   ? ? ? ? ? [facility] PT Assessment - 02/02/22 0001   ? ?  ? Assessment  ? Medical Diagnosis weakness falls   ? Referring Provider (PT) Yvonna Alanis   ? Onset Date/Surgical Date 01/02/22   ?  ? Precautions  ? Precautions None   ?  ? Balance Screen  ? Has the patient fallen in the past 6 months Yes   ? How many times? 2   ? Has the patient had a decrease in activity level because of a fear of falling?  Yes   ? Is the patient reluctant to leave their home because of a fear of falling?  No   ?  ? Home Environment  ? Additional Comments one step into the home, does housework, yardwork and gardening   ?  ? Prior Function  ? Level of Independence Independent   ? Vocation Retired   ? Leisure [facility] 2[facility] sometimes, she does a class   ?  ? ROM / Strength  ? AROM / PROM / Strength AROM;Strength   ?  ? AROM  ? Overall AROM Comments knee  AROM right 0-105 degrees   ?  ? Strength  ? Overall Strength Comments hips 4-/5, knees 4-/5   ?  ? Transfers  ? Comments worked on getting up from the floor, various ways and tried kneeling and up firts could do on 8" foam mat but could not do on 2" foam   ?  ? Standardized Balance Assessment  ? Standardized Balance Assessment Berg Balance Test   ?  ? Berg Balance Test  ? Sit to Stand Able to stand without using hands and stabilize independently   ? Standing Unsupported Able to stand safely 2 minutes   ? Sitting with Back Unsupported but Feet Supported on Floor or Stool Able to sit safely and securely 2 minutes   ? Stand to Sit Sits safely with minimal use of hands   ? Transfers Able to transfer safely, minor use of hands   ? Standing Unsupported with Eyes Closed Able to stand 10 seconds with supervision   ? Standing Unsupported with Feet Together Able to place feet together independently and stand 1 minute safely   ? From Standing, Reach Forward with Outstretched Arm Can reach forward >12 cm safely (5")   ? From Standing Position, Pick up Object from Floor Able to pick up shoe safely and  easily   ? From Standing Position, Turn to Look Behind Over each Shoulder Turn sideways only but maintains balance   ? Turn 360 Degrees Able to turn 360 degrees safely in 4 seconds or less   ? Standing Unsupported, Alternately Place Feet on Step/Stool Able to stand independently and complete 8 steps >20 seconds   ? Standing Unsupported, One Foot in Front Able to plae foot ahead of the other independently and hold 30 seconds   ? Standing on One Leg Able to lift leg independently and hold 5-10 seconds   ? Total Score 49   ? ?  ?  ? ?  ? ? ? ? ? ? ? ? ? ? ? ? ? ?Objective measurements completed on examination: See above findings.  ? ? ? ? ? ? ? ? ? ? ? ? ? ? ? ? PT Short Term Goals - 02/02/22 1718   ? ?  ? PT SHORT TERM GOAL #1  ? Title independent with initial HEP   ? Time 2   ? Period Weeks   ? Status New   ? ?  ?  ? ?  ? ? ? ? PT Long Term Goals - 02/02/22 1719   ? ?  ? PT LONG TERM GOAL #1  ? Title increase AROM of right knee to 0-120 degrees flexion   ? Time 12   ? Period Weeks   ? Status New   ?  ? PT LONG TERM GOAL #2  ? Title report being able to negotiate curbs out in the public without fear or hesitation   ? Time 12   ? Period Weeks   ? Status New   ?  ? PT LONG TERM GOAL #3  ? Title increase hip and knee strength to 4+/5   ? Time 12   ? Period Weeks   ? Status New   ?  ? PT LONG TERM GOAL #4  ? Title go up and down stairs step over step   ? Time 12   ? Period Weeks   ? Status New   ?  ? PT LONG TERM GOAL #5  ? Title resume a fitness/gym program   ?  Time 12   ? Period Weeks   ? Status New   ? ?  ?  ? ?  ? ? ? ? ? ? ? ? ? Plan - 02/02/22 1715   ? ? Clinical Impression Statement Patient reports 2 falls in the past 6 months.  She reports mostly tripping.  She reports that since that time she has had more fear of walking and some difficulty with this, she also reports that she had issues with not being able to get up and requiring help.  Her hips are very weak and her knees are weak.  Her Berg score was good but  with some issues, she wanted to try to get off the floor and she really struggled with this and needed help.  She was very fatigued and short of breath after the floor work   ? Stability/Clinical Decision Making Stable/Uncomplicated   ? Clinical Decision Making Low   ? Rehab Potential Good   ? PT Frequency 2x / week   ? PT Duration 12 weeks   ? PT Treatment/Interventions ADLs/Self Care Home Management;Gait training;Neuromuscular re-education;Balance training;Therapeutic exercise;Therapeutic activities;Functional mobility training;Stair training;Patient/family education;Manual techniques   ? PT Next Visit Plan work on strength, balance and function   ? Consulted and Agree with Plan of Care Patient   ? ?  ?  ? ?  ? ? ?Patient will benefit from skilled therapeutic intervention in order to improve the following deficits and impairments:  Abnormal gait, Decreased range of motion, Difficulty walking, Decreased endurance, Decreased activity tolerance, Decreased balance, Decreased strength, Improper body mechanics, Impaired flexibility, Pain, Increased muscle spasms, Dizziness ? ?Visit Diagnosis: ?Difficulty in walking, not elsewhere classified - Plan: PT plan of care cert/re-cert ? ?Muscle weakness (generalized) - Plan: PT plan of care cert/re-cert ? ?Repeated falls - Plan: PT plan of care cert/re-cert ? ? ? ? ?Problem List ?Patient Active Problem List  ? Diagnosis Date Noted  ? 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 Barrett's esophagus without dysplasia 11/25/2018  ? 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HCC) 05/25/2017  ? Age-related osteoporosis without current pathological fracture 08/04/2014  ? Chronic bronchitis (HCC) 08/04/2014  ? Pure hypercholesterolemia 08/04/2014  ? Aortic stenosis 08/04/2014  ? ? Jearld Lesch?Deidrick Rainey W, PT ?02/02/2022, 5:21 PM ? ?[place] ?[facility] ?[id number] ?[place], [place], [address] ?Phone: [phone]   Fax:   [phone] ? ?Name: Kim Grant ?MRN: [id number] ?Date of Birth: 06/29/1941 ? ? ?

## 2022-02-03 ENCOUNTER — Ambulatory Visit: Payer: Medicare Other | Admitting: Family Medicine

## 2022-02-06 ENCOUNTER — Encounter: Payer: Self-pay | Admitting: Physical Therapy

## 2022-02-06 ENCOUNTER — Ambulatory Visit: Payer: Medicare Other | Admitting: Physical Therapy

## 2022-02-06 DIAGNOSIS — M6281 Muscle weakness (generalized): Secondary | ICD-10-CM | POA: Diagnosis not present

## 2022-02-06 DIAGNOSIS — R262 Difficulty in walking, not elsewhere classified: Secondary | ICD-10-CM

## 2022-02-06 DIAGNOSIS — R296 Repeated falls: Secondary | ICD-10-CM | POA: Diagnosis not present

## 2022-02-06 DIAGNOSIS — M25661 Stiffness of right knee, not elsewhere classified: Secondary | ICD-10-CM | POA: Diagnosis not present

## 2022-02-06 NOTE — Therapy (Signed)
[place] ?[place] [address] ?[place], [place], [address] ?Phone: [phone]   Fax:  [phone] ? ?Physical Therapy Treatment ? ?Patient Details  ?Name: Kim Grant ?MRN: [id number] ?Date of Birth: [birth year] ?Referring Provider (PT): Daniel Nones ? ? ?Encounter Date: 02/06/2022 ? ? PT End of Session - 02/06/22 1447   ? ? Visit Number 2   ? Date for PT Re-Evaluation 05/04/22   ? Authorization Type BCBS MC   ? PT Start Time 1660   ? PT Stop Time 6004   ? PT Time Calculation (min) 47 min   ? Activity Tolerance Patient tolerated treatment well   ? Behavior During Therapy [facility] for tasks assessed/performed   ? ?  ?  ? ?  ? ? ?Past Medical History:  ?Diagnosis Date  ? Depression   ? Hyperlipemia   ? Osteoporosis   ? ? ?Past Surgical History:  ?Procedure Laterality Date  ? ABDOMINAL HYSTERECTOMY    ? BREAST SURGERY    ? ROTATOR CUFF REPAIR    ? ? ?There were no vitals filed for this visit. ? ? Subjective Assessment - 02/06/22 1358   ? ? Subjective Patient reports no issues   ? Currently in Pain? No/denies   ? ?  ?  ? ?  ? ? ? ? ? ? ? ? ? ? ? ? ? ? ? ? ? ? ? ? [place] Adult PT Treatment/Exercise - 02/06/22 0001   ? ?  ? Transfers  ? Comments again worked on getting up from floor, tried right knee, left knee and then a push up from the bed to both feet, she struggled with the right knee leading   ?  ? High Level Balance  ? High Level Balance Comments aireax balance beam tandem and side stepping   ?  ? Exercises  ? Exercises Knee/Hip   ?  ? Knee/Hip Exercises: Aerobic  ? Recumbent Bike level 2 x 5 minutes   ? Nustep level 5 x 5 minutes   ?  ? Knee/Hip Exercises: Machines for Strengthening  ? Cybex Knee Extension 5# 2x10   ? Cybex Knee Flexion 20# 2x10   ? Cybex Leg Press 20# 2x10   ? Hip Cybex 5# hip abduction and extension 10 x each   ? Other Machine 5# straight arm extension 5# and then 10# 10 each   ? ?  ?  ? ?  ? ? ? ? ? ? ? ? ? ? ? ? PT Short Term Goals - 02/06/22 1452   ? ?  ?  PT SHORT TERM GOAL #1  ? Title independent with initial HEP   ? Status Partially Met   ? ?  ?  ? ?  ? ? ? ? PT Long Term Goals - 02/02/22 1719   ? ?  ? PT LONG TERM GOAL #1  ? Title increase AROM of right knee to 0-120 degrees flexion   ? Time 12   ? Period Weeks   ? Status New   ?  ? PT LONG TERM GOAL #2  ? Title report being able to negotiate curbs out in the public without fear or hesitation   ? Time 12   ? Period Weeks   ? Status New   ?  ? PT LONG TERM GOAL #3  ? Title increase hip and knee strength to 4+/5   ? Time 12   ? Period Weeks   ? Status New   ?  ?  PT LONG TERM GOAL #4  ? Title go up and down stairs step over step   ? Time 12   ? Period Weeks   ? Status New   ?  ? PT LONG TERM GOAL #5  ? Title resume a fitness/gym program   ? Time 12   ? Period Weeks   ? Status New   ? ?  ?  ? ?  ? ? ? ? ? ? ? ? Plan - 02/06/22 1447   ? ? Clinical Impression Statement Patient was fatigued with the cardio, she struggled with the tandem walking on the airex.  we tried a few different ways to get off the floor, she had trouble going from sitting to quadraped, she had difficulty with using the right leg to get up.  We did this after the session to simulate her being tired   ? PT Next Visit Plan work on strength, balance and function   ? Consulted and Agree with Plan of Care Patient   ? ?  ?  ? ?  ? ? ?Patient will benefit from skilled therapeutic intervention in order to improve the following deficits and impairments:  Abnormal gait, Decreased range of motion, Difficulty walking, Decreased endurance, Decreased activity tolerance, Decreased balance, Decreased strength, Improper body mechanics, Impaired flexibility, Pain, Increased muscle spasms, Dizziness ? ?Visit Diagnosis: ?Difficulty in walking, not elsewhere classified ? ?Muscle weakness (generalized) ? ?Repeated falls ? ? ? ? ?Problem List ?Patient Active Problem List  ? Diagnosis Date Noted  ? 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 Barrett's esophagus without  dysplasia 11/25/2018  ? 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 Age-related osteoporosis without current pathological fracture 08/04/2014  ? Chronic bronchitis ([place]) 08/04/2014  ? Pure hypercholesterolemia 08/04/2014  ? Aortic stenosis 08/04/2014  ? ? Sumner Boast, PT ?02/06/2022, 2:52 PM ? ?[place] ?[address] ?[place], [place], [address] ?Phone: [phone]   Fax:  [phone] ? ?Name: Julius Boniface ?MRN: [id number] ?Date of Birth: 01/31/1941 ? ? ? ?

## 2022-02-09 ENCOUNTER — Ambulatory Visit: Payer: Medicare Other | Admitting: Physical Therapy

## 2022-02-09 ENCOUNTER — Encounter: Payer: Self-pay | Admitting: Physical Therapy

## 2022-02-09 DIAGNOSIS — M25661 Stiffness of right knee, not elsewhere classified: Secondary | ICD-10-CM | POA: Diagnosis not present

## 2022-02-09 DIAGNOSIS — R296 Repeated falls: Secondary | ICD-10-CM

## 2022-02-09 DIAGNOSIS — R262 Difficulty in walking, not elsewhere classified: Secondary | ICD-10-CM | POA: Diagnosis not present

## 2022-02-09 DIAGNOSIS — M6281 Muscle weakness (generalized): Secondary | ICD-10-CM | POA: Diagnosis not present

## 2022-02-09 NOTE — Therapy (Signed)
[place] ?[place] [address] ?[place], [place], [address] ?Phone: [phone]   Fax:  [phone] ? ?Physical Therapy Treatment ? ?Patient Details  ?Name: Kim Grant ?MRN: [id number] ?Date of Birth: 01/11/1941 ?Referring Provider (PT): Daniel Nones ? ? ?Encounter Date: 02/09/2022 ? ? PT End of Session - 02/09/22 1354   ? ? Visit Number 3   ? Date for PT Re-Evaluation 05/04/22   ? Authorization Type BCBS MC   ? PT Start Time 1310   ? PT Stop Time 8110   ? PT Time Calculation (min) 44 min   ? Activity Tolerance Patient tolerated treatment well   ? Behavior During Therapy [facility] for tasks assessed/performed   ? ?  ?  ? ?  ? ? ?Past Medical History:  ?Diagnosis Date  ? Depression   ? Hyperlipemia   ? Osteoporosis   ? ? ?Past Surgical History:  ?Procedure Laterality Date  ? ABDOMINAL HYSTERECTOMY    ? BREAST SURGERY    ? ROTATOR CUFF REPAIR    ? ? ?There were no vitals filed for this visit. ? ? Subjective Assessment - 02/09/22 1316   ? ? Subjective I was a little sore   ? Currently in Pain? No/denies   ? ?  ?  ? ?  ? ? ? ? ? ? ? ? ? ? ? ? ? ? ? ? ? ? ? ? [place] Adult PT Treatment/Exercise - 02/09/22 0001   ? ?  ? Transfers  ? Comments again worked on getting up from floor, tried right knee, left knee and then a push up from the bed to both feet, she struggled with the right knee leading   ?  ? High Level Balance  ? High Level Balance Comments airex cone toe touch, ball toss, head turns   ?  ? Knee/Hip Exercises: Aerobic  ? Recumbent Bike level 3 x 5 minutes   ? Nustep level 5 x 6 minutes   ?  ? Knee/Hip Exercises: Machines for Strengthening  ? Cybex Knee Extension 10# 2x10   ? Cybex Knee Flexion 20# 3x10   ? Cybex Leg Press 20# 2x10   ? Hip Cybex 5# hip abduction and extension 12 x each   ? Other Machine 5# straight arm extension 5# and then 10# 10 each   ? ?  ?  ? ?  ? ? ? ? ? ? ? ? ? ? ? ? PT Short Term Goals - 02/06/22 1452   ? ?  ? PT SHORT TERM GOAL #1  ? Title independent  with initial HEP   ? Status Partially Met   ? ?  ?  ? ?  ? ? ? ? PT Long Term Goals - 02/09/22 1356   ? ?  ? PT LONG TERM GOAL #1  ? Title increase AROM of right knee to 0-120 degrees flexion   ? Status On-going   ?  ? PT LONG TERM GOAL #2  ? Title report being able to negotiate curbs out in the public without fear or hesitation   ? Status On-going   ? ?  ?  ? ?  ? ? ? ? ? ? ? ? Plan - 02/09/22 1354   ? ? Clinical Impression Statement Patient doing well, fatigues with the cardio, she has some diffiuclty with balance, tends to not trust herself, the getting up from the floor with something to hold onto  she does pretty good,  without anything to hold onto she really struggles   ? PT Next Visit Plan work on strength, balance and function   ? Consulted and Agree with Plan of Care Patient   ? ?  ?  ? ?  ? ? ?Patient will benefit from skilled therapeutic intervention in order to improve the following deficits and impairments:  Abnormal gait, Decreased range of motion, Difficulty walking, Decreased endurance, Decreased activity tolerance, Decreased balance, Decreased strength, Improper body mechanics, Impaired flexibility, Pain, Increased muscle spasms, Dizziness ? ?Visit Diagnosis: ?Difficulty in walking, not elsewhere classified ? ?Muscle weakness (generalized) ? ?Repeated falls ? ?Stiffness of right knee, not elsewhere classified ? ? ? ? ?Problem List ?Patient Active Problem List  ? Diagnosis Date Noted  ? 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 Barrett's esophagus without dysplasia 11/25/2018  ? 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 Age-related osteoporosis without current pathological fracture 08/04/2014  ? Chronic bronchitis ([place]) 08/04/2014  ? Pure hypercholesterolemia 08/04/2014  ? Aortic stenosis 08/04/2014  ? ? Sumner Boast, PT ?02/09/2022, 1:57 PM ? ?[place] ?[address] ?[place], [place], [address] ?Phone:  [phone]   Fax:  [phone] ? ?Name: Dianara Smullen ?MRN: [id number] ?Date of Birth: 31-May-1941 ? ? ? ?

## 2022-02-10 ENCOUNTER — Encounter: Payer: Self-pay | Admitting: Family

## 2022-02-10 ENCOUNTER — Inpatient Hospital Stay: Payer: Medicare Other

## 2022-02-10 ENCOUNTER — Inpatient Hospital Stay (HOSPITAL_BASED_OUTPATIENT_CLINIC_OR_DEPARTMENT_OTHER): Payer: Medicare Other | Admitting: Family

## 2022-02-10 ENCOUNTER — Inpatient Hospital Stay: Payer: Medicare Other | Attending: Hematology & Oncology

## 2022-02-10 VITALS — BP 133/75 | HR 60 | Temp 98.2°F | Resp 18 | Wt 165.0 lb

## 2022-02-10 DIAGNOSIS — N189 Chronic kidney disease, unspecified: Secondary | ICD-10-CM | POA: Diagnosis not present

## 2022-02-10 DIAGNOSIS — D631 Anemia in chronic kidney disease: Secondary | ICD-10-CM | POA: Diagnosis not present

## 2022-02-10 DIAGNOSIS — D509 Iron deficiency anemia, unspecified: Secondary | ICD-10-CM

## 2022-02-10 LAB — CMP (CANCER CENTER ONLY)
ALT: 16 U/L (ref 0–44)
AST: 22 U/L (ref 15–41)
Albumin: 4.1 g/dL (ref 3.5–5.0)
Alkaline Phosphatase: 44 U/L (ref 38–126)
Anion gap: 4 — ABNORMAL LOW (ref 5–15)
BUN: 20 mg/dL (ref 8–23)
CO2: 29 mmol/L (ref 22–32)
Calcium: 9.4 mg/dL (ref 8.9–10.3)
Chloride: 99 mmol/L (ref 98–111)
Creatinine: 0.96 mg/dL (ref 0.44–1.00)
GFR, Estimated: 60 mL/min — ABNORMAL LOW (ref >60)
Glucose, Bld: 79 mg/dL (ref 70–99)
Potassium: 4.6 mmol/L (ref 3.5–5.1)
Sodium: 132 mmol/L — ABNORMAL LOW (ref 135–145)
Total Bilirubin: 0.3 mg/dL (ref 0.3–1.2)
Total Protein: 6.8 g/dL (ref 6.5–8.1)

## 2022-02-10 LAB — CBC WITH DIFFERENTIAL (CANCER CENTER ONLY)
Abs Immature Granulocytes: 0.02 10*3/uL (ref 0.00–0.07)
Basophils Absolute: 0 10*3/uL (ref 0.0–0.1)
Basophils Relative: 1 %
Eosinophils Absolute: 0.2 10*3/uL (ref 0.0–0.5)
Eosinophils Relative: 4 %
HCT: 30.9 % — ABNORMAL LOW (ref 36.0–46.0)
Hemoglobin: 10.2 g/dL — ABNORMAL LOW (ref 12.0–15.0)
Immature Granulocytes: 0 %
Lymphocytes Relative: 29 %
Lymphs Abs: 1.7 10*3/uL (ref 0.7–4.0)
MCH: 27.9 pg (ref 26.0–34.0)
MCHC: 33 g/dL (ref 30.0–36.0)
MCV: 84.7 fL (ref 80.0–100.0)
Monocytes Absolute: 0.5 10*3/uL (ref 0.1–1.0)
Monocytes Relative: 9 %
Neutro Abs: 3.5 10*3/uL (ref 1.7–7.7)
Neutrophils Relative %: 57 %
Platelet Count: 270 10*3/uL (ref 150–400)
RBC: 3.65 MIL/uL — ABNORMAL LOW (ref 3.87–5.11)
RDW: 13.2 % (ref 11.5–15.5)
WBC Count: 6.1 10*3/uL (ref 4.0–10.5)
nRBC: 0 % (ref 0.0–0.2)

## 2022-02-10 LAB — FERRITIN: Ferritin: 50 ng/mL (ref 11–307)

## 2022-02-10 MED ORDER — EPOETIN ALFA-EPBX 40000 UNIT/ML IJ SOLN
40000.0000 [IU] | Freq: Once | INTRAMUSCULAR | Status: AC
  Administered 2022-02-10: 40000 [IU] via SUBCUTANEOUS
  Filled 2022-02-10: qty 1

## 2022-02-10 NOTE — Progress Notes (Signed)
?Hematology and Oncology Follow Up Visit ? ?Kim Grant ?[id number] ?02-12-1941 81 y.o. ?02/10/2022 ? ? ?Principle Diagnosis:  ?Erythropoietin deficiency anemia  ?  ?Current Therapy:        ?Retacrit 40,000 units SQ for Hgb < 11 ?  ?Interim History:  Kim Grant is here today for follow-up. She is symptomatic with fatigue.  ?She states that she has started doing PT twice a week for strengthening.  ?No new falls, no syncope.  ?No fever, chills, n/v, cough, rash, dizziness, SOB, chest pain, palpitations, abdominal pain or changes in bowel or bladder habits.  ?No swelling, numbness or tingling in her extremities.  ?No blood loss noted. No abnormal bruising, no petechiae.  ?She states that her appetite is good and she is staying well hydrated. Her weight is stable at 165 lbs.  ? ?ECOG Performance Status: 1 - Symptomatic but completely ambulatory ? ?Medications:  ?Allergies as of 02/10/2022   ?No Known Allergies ?  ? ?  ?Medication List  ?  ? ?  ? Accurate as of February 10, 2022  1:43 PM. If you have any questions, ask your nurse or doctor.  ?  ?  ? ?  ? ?calcium citrate-vitamin D 315-200 MG-UNIT tablet ?Commonly known as: CITRACAL+D ?Take 3 tablets by mouth daily. ?  ?citalopram 40 MG tablet ?Commonly known as: CELEXA ?Take 1 tablet (40 mg total) by mouth daily. ?  ?denosumab 60 MG/ML Sosy injection ?Commonly known as: PROLIA ?Inject 60 mg into the skin every 6 (six) months. ?  ?famotidine 20 MG tablet ?Commonly known as: PEPCID ?Take by mouth. ?  ?ibuprofen 200 MG tablet ?Commonly known as: ADVIL ?Take 200 mg by mouth every 6 (six) hours as needed for mild pain or moderate pain. ?  ?meclizine 25 MG tablet ?Commonly known as: ANTIVERT ?Take 1 tablet (25 mg total) by mouth 3 (three) times daily as needed for dizziness. ?  ?melatonin 5 MG Tabs ?Take 5 mg by mouth at bedtime as needed. ?  ?multivitamin with minerals Tabs tablet ?Take 1 tablet by mouth daily. ?  ?pravastatin 40 MG tablet ?Commonly known as: PRAVACHOL ?Take 1 tablet  (40 mg total) by mouth daily. ?  ? ?  ? ? ?Allergies: No Known Allergies ? ?Past Medical History, Surgical history, Social history, and Family History were reviewed and updated. ? ?Review of Systems: ?All other 10 point review of systems is negative.  ? ?Physical Exam: ? vitals were not taken for this visit.  ? ?Wt Readings from Last 3 Encounters:  ?11/30/21 161 lb 1.3 oz (73.1 kg)  ?10/19/21 162 lb (73.5 kg)  ?10/19/21 162 lb 12.8 oz (73.8 kg)  ? ? ?Ocular: Sclerae unicteric, pupils equal, round and reactive to light ?Ear-nose-throat: Oropharynx clear, dentition fair ?Lymphatic: No cervical or supraclavicular adenopathy ?Lungs no rales or rhonchi, good excursion bilaterally ?Heart regular rate and rhythm, no murmur appreciated ?Abd soft, nontender, positive bowel sounds ?MSK no focal spinal tenderness, no joint edema ?Neuro: non-focal, well-oriented, appropriate affect ?Breasts: Deferred  ? ?Lab Results  ?Component Value Date  ? WBC 6.1 02/10/2022  ? HGB 10.2 (L) 02/10/2022  ? HCT 30.9 (L) 02/10/2022  ? MCV 84.7 02/10/2022  ? PLT 270 02/10/2022  ? ?Lab Results  ?Component Value Date  ? FERRITIN 71 11/30/2021  ? IRON 62 11/30/2021  ? TIBC 385 11/30/2021  ? UIBC 323 11/30/2021  ? IRONPCTSAT 16 11/30/2021  ? ?Lab Results  ?Component Value Date  ? RETICCTPCT 0.6 07/26/2021  ?  RBC 3.65 (L) 02/10/2022  ? ?No results found for: KPAFRELGTCHN, LAMBDASER, KAPLAMBRATIO ?No results found for: IGGSERUM, IGA, IGMSERUM ?No results found for: TOTALPROTELP, ALBUMINELP, A1GS, A2GS, BETS, BETA2SER, GAMS, MSPIKE, SPEI ?  Chemistry   ?   ?Component Value Date/Time  ? NA 135 11/30/2021 1357  ? K 4.6 11/30/2021 1357  ? CL 100 11/30/2021 1357  ? CO2 29 11/30/2021 1357  ? BUN 21 11/30/2021 1357  ? CREATININE 1.03 (H) 11/30/2021 1357  ?    ?Component Value Date/Time  ? CALCIUM 9.7 11/30/2021 1357  ? ALKPHOS 52 11/30/2021 1357  ? AST 28 11/30/2021 1357  ? ALT 22 11/30/2021 1357  ? BILITOT 0.3 11/30/2021 1357  ?  ? ? ? ?Impression and Plan:  Kim Grant is a very pleasant 81 yo caucasian female with erythropoietin deficiency anemia.  ?ESA given, Hgb 10.2.  ?Iron studies are pending.  ?Follow-up in 6 weeks.   ? ?Lottie Dawson, NP ?4/21/20231:43 PM ? ?

## 2022-02-10 NOTE — Patient Instructions (Signed)
Epoetin Alfa injection ?What is this medication? ?EPOETIN ALFA (e POE e tin AL fa) helps your body make more red blood cells. This medicine is used to treat anemia caused by chronic kidney disease, cancer chemotherapy, or HIV-therapy. It may also be used before surgery if you have anemia. ?This medicine may be used for other purposes; ask your health care provider or pharmacist if you have questions. ?COMMON BRAND NAME(S): Epogen, Procrit, Retacrit ?What should I tell my care team before I take this medication? ?They need to know if you have any of these conditions: ?cancer ?heart disease ?high blood pressure ?history of blood clots ?history of stroke ?low levels of folate, iron, or vitamin B12 in the blood ?seizures ?an unusual or allergic reaction to erythropoietin, albumin, benzyl alcohol, hamster proteins, other medicines, foods, dyes, or preservatives ?pregnant or trying to get pregnant ?breast-feeding ?How should I use this medication? ?This medicine is for injection into a vein or under the skin. It is usually given by a health care professional in a hospital or clinic setting. ?If you get this medicine at home, you will be taught how to prepare and give this medicine. Use exactly as directed. Take your medicine at regular intervals. Do not take your medicine more often than directed. ?It is important that you put your used needles and syringes in a special sharps container. Do not put them in a trash can. If you do not have a sharps container, call your pharmacist or healthcare provider to get one. ?A special MedGuide will be given to you by the pharmacist with each prescription and refill. Be sure to read this information carefully each time. ?Talk to your pediatrician regarding the use of this medicine in children. While this drug may be prescribed for selected conditions, precautions do apply. ?Overdosage: If you think you have taken too much of this medicine contact a poison control center or emergency  room at once. ?NOTE: This medicine is only for you. Do not share this medicine with others. ?What if I miss a dose? ?If you miss a dose, take it as soon as you can. If it is almost time for your next dose, take only that dose. Do not take double or extra doses. ?What may interact with this medication? ?Interactions have not been studied. ?This list may not describe all possible interactions. Give your health care provider a list of all the medicines, herbs, non-prescription drugs, or dietary supplements you use. Also tell them if you smoke, drink alcohol, or use illegal drugs. Some items may interact with your medicine. ?What should I watch for while using this medication? ?Your condition will be monitored carefully while you are receiving this medicine. ?You may need blood work done while you are taking this medicine. ?This medicine may cause a decrease in vitamin B6. You should make sure that you get enough vitamin B6 while you are taking this medicine. Discuss the foods you eat and the vitamins you take with your health care professional. ?What side effects may I notice from receiving this medication? ?Side effects that you should report to your doctor or health care professional as soon as possible: ?allergic reactions like skin rash, itching or hives, swelling of the face, lips, or tongue ?seizures ?signs and symptoms of a blood clot such as breathing problems; changes in vision; chest pain; severe, sudden headache; pain, swelling, warmth in the leg; trouble speaking; sudden numbness or weakness of the face, arm or leg ?signs and symptoms of a stroke like   changes in vision; confusion; trouble speaking or understanding; severe headaches; sudden numbness or weakness of the face, arm or leg; trouble walking; dizziness; loss of balance or coordination ?Side effects that usually do not require medical attention (report to your doctor or health care professional if they continue or are  bothersome): ?chills ?cough ?dizziness ?fever ?headaches ?joint pain ?muscle cramps ?muscle pain ?nausea, vomiting ?pain, redness, or irritation at site where injected ?This list may not describe all possible side effects. Call your doctor for medical advice about side effects. You may report side effects to FDA at 1-800-FDA-1088. ?Where should I keep my medication? ?Keep out of the reach of children. ?Store in a refrigerator between 2 and 8 degrees C (36 and 46 degrees F). Do not freeze or shake. Throw away any unused portion if using a single-dose vial. Multi-dose vials can be kept in the refrigerator for up to 21 days after the initial dose. Throw away unused medicine. ?NOTE: This sheet is a summary. It may not cover all possible information. If you have questions about this medicine, talk to your doctor, pharmacist, or health care provider. ?? 2023 Elsevier/Gold Standard (2017-06-12 00:00:00) ? ?

## 2022-02-13 ENCOUNTER — Ambulatory Visit: Payer: Medicare Other | Admitting: Physical Therapy

## 2022-02-13 ENCOUNTER — Encounter: Payer: Self-pay | Admitting: Physical Therapy

## 2022-02-13 DIAGNOSIS — M6281 Muscle weakness (generalized): Secondary | ICD-10-CM

## 2022-02-13 DIAGNOSIS — R296 Repeated falls: Secondary | ICD-10-CM

## 2022-02-13 DIAGNOSIS — M25661 Stiffness of right knee, not elsewhere classified: Secondary | ICD-10-CM | POA: Diagnosis not present

## 2022-02-13 DIAGNOSIS — R262 Difficulty in walking, not elsewhere classified: Secondary | ICD-10-CM | POA: Diagnosis not present

## 2022-02-13 LAB — IRON AND IRON BINDING CAPACITY (CC-WL,HP ONLY)
Iron: 78 ug/dL (ref 28–170)
Saturation Ratios: 22 % (ref 10.4–31.8)
TIBC: 358 ug/dL (ref 250–450)
UIBC: 280 ug/dL (ref 148–442)

## 2022-02-13 NOTE — Therapy (Signed)
[facility] ?[address] ?[place], [place], [address] ?Phone: [phone]   Fax:  [phone] ? ?Physical Therapy Treatment ? ?Patient Details  ?Name: Kim Grant ?MRN: [id number] ?Date of Birth: May 06, 1941 ?Referring Provider (PT): Yvonna Alanis ? ? ?Encounter Date: 02/13/2022 ? ? PT End of Session - 02/13/22 1445   ? ? Visit Number 4   ? Date for PT Re-Evaluation 05/04/22   ? Authorization Type BCBS MC   ? PT Start Time 1400   ? PT Stop Time 1445   ? PT Time Calculation (min) 45 min   ? Activity Tolerance Patient tolerated treatment well   ? Behavior During Therapy [facility] for tasks assessed/performed   ? ?  ?  ? ?  ? ? ?Past Medical History:  ?Diagnosis Date  ? Depression   ? Hyperlipemia   ? Osteoporosis   ? ? ?Past Surgical History:  ?Procedure Laterality Date  ? ABDOMINAL HYSTERECTOMY    ? BREAST SURGERY    ? ROTATOR CUFF REPAIR    ? ? ?There were no vitals filed for this visit. ? ? Subjective Assessment - 02/13/22 1403   ? ? Subjective I was really tired after the last session   ? Currently in Pain? No/denies   ? ?  ?  ? ?  ? ? ? ? ? ? ? ? ? ? ? ? ? ? ? ? ? ? ? ? [facility] Adult PT Treatment/Exercise - 02/13/22 0001   ? ?  ? Ambulation/Gait  ? Gait Comments gait outside around the back building working on speed, curbs and grass   ?  ? Knee/Hip Exercises: Aerobic  ? Nustep level 5 x 6 minutes   ?  ? Knee/Hip Exercises: Machines for Strengthening  ? Cybex Knee Extension 10# 2x10   ? Cybex Knee Flexion 20# 3x10   ? Hip Cybex 5# hip abduction and extension 2x10   ? Other Machine 5# straight arm extension 5# and then 10# 10 each   ?  ? Knee/Hip Exercises: Standing  ? Wall Squat Limitations sink squats   ?  ? Knee/Hip Exercises: Supine  ? Bridges with [employer] 20 reps   ? Bridges with Clamshell 20 reps   ? Other Supine Knee/Hip Exercises feet on ball K2C, bridges   ?  ? Knee/Hip Exercises: Sidelying  ? Clams 5# 2x10 each   ? ?  ?  ? ?  ? ? ? ? ? ? ? ? ? ? ? ? PT Short  Term Goals - 02/06/22 1452   ? ?  ? PT SHORT TERM GOAL #1  ? Title independent with initial HEP   ? Status Partially Met   ? ?  ?  ? ?  ? ? ? ? PT Long Term Goals - 02/09/22 1356   ? ?  ? PT LONG TERM GOAL #1  ? Title increase AROM of right knee to 0-120 degrees flexion   ? Status On-going   ?  ? PT LONG TERM GOAL #2  ? Title report being able to negotiate curbs out in the public without fear or hesitation   ? Status On-going   ? ?  ?  ? ?  ? ? ? ? ? ? ? ? Plan - 02/13/22 1445   ? ? Clinical Impression Statement with walking outside patient has a fear wants to hold onto me at first and then use my arm for curbs and in grass.  She can do without this but has a fear.  She gets very SOB and fatigues easily.  Fear is a big issue with the walking.,   ? PT Next Visit Plan work on strength, balance and function   ? Consulted and Agree with Plan of Care Patient   ? ?  ?  ? ?  ? ? ?Patient will benefit from skilled therapeutic intervention in order to improve the following deficits and impairments:  Abnormal gait, Decreased range of motion, Difficulty walking, Decreased endurance, Decreased activity tolerance, Decreased balance, Decreased strength, Improper body mechanics, Impaired flexibility, Pain, Increased muscle spasms, Dizziness ? ?Visit Diagnosis: ?Difficulty in walking, not elsewhere classified ? ?Muscle weakness (generalized) ? ?Repeated falls ? ?Stiffness of right knee, not elsewhere classified ? ? ? ? ?Problem List ?Patient Active Problem List  ? Diagnosis Date Noted  ? 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 Barrett's esophagus without dysplasia 11/25/2018  ? 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 Age-related osteoporosis without current pathological fracture 08/04/2014  ? Chronic bronchitis ([place]) 08/04/2014  ? Pure hypercholesterolemia 08/04/2014  ? Aortic stenosis 08/04/2014  ? ? Sumner Boast, PT ?02/13/2022, 3:19 PM ? ?[place] ?[address] ?[place], [place], [address] ?Phone: [phone]   Fax:  [phone] ? ?Name: Kim Grant ?MRN: [id number] ?Date of Birth: September 12, 1941 ? ? ? ?

## 2022-02-16 ENCOUNTER — Ambulatory Visit: Payer: Medicare Other | Admitting: Physical Therapy

## 2022-02-16 ENCOUNTER — Encounter: Payer: Self-pay | Admitting: Physical Therapy

## 2022-02-16 DIAGNOSIS — R296 Repeated falls: Secondary | ICD-10-CM

## 2022-02-16 DIAGNOSIS — M6281 Muscle weakness (generalized): Secondary | ICD-10-CM | POA: Diagnosis not present

## 2022-02-16 DIAGNOSIS — M25661 Stiffness of right knee, not elsewhere classified: Secondary | ICD-10-CM

## 2022-02-16 DIAGNOSIS — R262 Difficulty in walking, not elsewhere classified: Secondary | ICD-10-CM

## 2022-02-16 NOTE — Therapy (Signed)
[place] ?[place] [address] ?[place], [place], [address] ?Phone: [phone]   Fax:  [phone] ? ?Physical Therapy Treatment ? ?Patient Details  ?Name: Kim Grant ?MRN: [id number] ?Date of Birth: [birth year] ?Referring Provider (PT): Daniel Nones ? ? ?Encounter Date: 02/16/2022 ? ? PT End of Session - 02/16/22 1402   ? ? Visit Number 5   ? Date for PT Re-Evaluation 05/04/22   ? Authorization Type BCBS MC   ? PT Start Time 3007   ? PT Stop Time 1356   ? PT Time Calculation (min) 50 min   ? Activity Tolerance Patient tolerated treatment well   ? Behavior During Therapy [facility] for tasks assessed/performed   ? ?  ?  ? ?  ? ? ?Past Medical History:  ?Diagnosis Date  ? Depression   ? Hyperlipemia   ? Osteoporosis   ? ? ?Past Surgical History:  ?Procedure Laterality Date  ? ABDOMINAL HYSTERECTOMY    ? BREAST SURGERY    ? ROTATOR CUFF REPAIR    ? ? ?There were no vitals filed for this visit. ? ? Subjective Assessment - 02/16/22 1309   ? ? Subjective Tired, a little sore after the last time.   ? Currently in Pain? No/denies   ? ?  ?  ? ?  ? ? ? ? ? ? ? ? ? ? ? ? ? ? ? ? ? ? ? ? [facility] Adult PT Treatment/Exercise - 02/16/22 0001   ? ?  ? Transfers  ? Comments up and down from the floor a few times   ?  ? High Level Balance  ? High Level Balance Comments cone toe touches on and off airex, side step on and off airex   ?  ? Knee/Hip Exercises: Aerobic  ? Recumbent Bike level 3 x 5 minutes   ? Nustep level 5 x 6 minutes   ?  ? Knee/Hip Exercises: Machines for Strengthening  ? Cybex Knee Extension 10# 3x10   ? Cybex Knee Flexion 25# 3x10   ? Other Machine 10# straight arm extension 2x10 cues for posture and core actiation, 10# 2x10 rows, 20# lats 2x10   ?  ? Knee/Hip Exercises: Standing  ? Hip Abduction Right;Left;2 sets;10 reps   ? Abduction Limitations red tband   ? Hip Extension Both;2 sets;10 reps   ? Extension Limitations red tband   ?  ? Knee/Hip Exercises: Seated  ? Other Seated  Knee/Hip Exercises sit to stand 18" surface without hands, also doing a small pause in the middle and a glute squeeze at the top   ? ?  ?  ? ?  ? ? ? ? ? ? ? ? ? ? ? ? PT Short Term Goals - 02/06/22 1452   ? ?  ? PT SHORT TERM GOAL #1  ? Title independent with initial HEP   ? Status Partially Met   ? ?  ?  ? ?  ? ? ? ? PT Long Term Goals - 02/16/22 1420   ? ?  ? PT LONG TERM GOAL #1  ? Title increase AROM of right knee to 0-120 degrees flexion   ? Status Partially Met   ?  ? PT LONG TERM GOAL #2  ? Title report being able to negotiate curbs out in the public without fear or hesitation   ? Status On-going   ?  ? PT LONG TERM GOAL #3  ? Title increase hip and  knee strength to 4+/5   ? Status On-going   ?  ? PT LONG TERM GOAL #4  ? Title go up and down stairs step over step   ? Status On-going   ?  ? PT LONG TERM GOAL #5  ? Title resume a fitness/gym program   ? Status On-going   ? ?  ?  ? ?  ? ? ? ? ? ? ? ? Plan - 02/16/22 1403   ? ? Clinical Impression Statement Patient did very well with sit to stand without hand, even had her do a pause to work quads more.  We tried to do the red tband in the door to replicate the HEP with hip extension and abduction, she really struggled with this, she could not reach down and get her foot in the band, a little balance but mostly just could not get the leg up and in the loop.  She reports feeling stronger and better with her walking but still some balance issues at times   ? PT Next Visit Plan continue to push her abilities   ? Consulted and Agree with Plan of Care Patient   ? ?  ?  ? ?  ? ? ?Patient will benefit from skilled therapeutic intervention in order to improve the following deficits and impairments:  Abnormal gait, Decreased range of motion, Difficulty walking, Decreased endurance, Decreased activity tolerance, Decreased balance, Decreased strength, Improper body mechanics, Impaired flexibility, Pain, Increased muscle spasms, Dizziness ? ?Visit Diagnosis: ?Difficulty in  walking, not elsewhere classified ? ?Muscle weakness (generalized) ? ?Repeated falls ? ?Stiffness of right knee, not elsewhere classified ? ? ? ? ?Problem List ?Patient Active Problem List  ? Diagnosis Date Noted  ? 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 Barrett's esophagus without dysplasia 11/25/2018  ? 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[place]) 05/25/2017  ? Age-related osteoporosis without current pathological fracture 08/04/2014  ? Chronic bronchitis ([place]) 08/04/2014  ? Pure hypercholesterolemia 08/04/2014  ? Aortic stenosis 08/04/2014  ? ? Sumner Boast, PT ?02/16/2022, 2:20 PM ? ?[place] ?[address] ?[place], [place], [address] ?Phone: [phone]   Fax:  [phone] ? ?Name: Kim Grant ?MRN: [id number] ?Date of Birth: 1941-01-02 ? ? ? ?

## 2022-02-17 ENCOUNTER — Ambulatory Visit (INDEPENDENT_AMBULATORY_CARE_PROVIDER_SITE_OTHER): Payer: Medicare Other | Admitting: Family Medicine

## 2022-02-17 ENCOUNTER — Encounter: Payer: Self-pay | Admitting: Family Medicine

## 2022-02-17 VITALS — BP 120/62 | HR 66 | Temp 97.3°F | Ht 64.5 in | Wt 161.4 lb

## 2022-02-17 DIAGNOSIS — E782 Mixed hyperlipidemia: Secondary | ICD-10-CM | POA: Diagnosis not present

## 2022-02-17 DIAGNOSIS — M810 Age-related osteoporosis without current pathological fracture: Secondary | ICD-10-CM

## 2022-02-17 DIAGNOSIS — I350 Nonrheumatic aortic (valve) stenosis: Secondary | ICD-10-CM | POA: Diagnosis not present

## 2022-02-17 DIAGNOSIS — R42 Dizziness and giddiness: Secondary | ICD-10-CM | POA: Insufficient documentation

## 2022-02-17 DIAGNOSIS — F330 Major depressive disorder, recurrent, mild: Secondary | ICD-10-CM

## 2022-02-17 DIAGNOSIS — K2270 Barrett's esophagus without dysplasia: Secondary | ICD-10-CM | POA: Diagnosis not present

## 2022-02-17 DIAGNOSIS — D631 Anemia in chronic kidney disease: Secondary | ICD-10-CM

## 2022-02-17 MED ORDER — PRAVASTATIN SODIUM 40 MG PO TABS: 40.0000 mg | ORAL_TABLET | Freq: Every day | ORAL | 3 refills | Status: DC

## 2022-02-17 MED ORDER — CITALOPRAM HYDROBROMIDE 40 MG PO TABS: 40.0000 mg | ORAL_TABLET | Freq: Every day | ORAL | 3 refills | Status: DC

## 2022-02-17 NOTE — Progress Notes (Signed)
? ?  Kim Grant is a 81 y.o. female who presents today for an office visit. ? ?Assessment/Plan:  ?New/Acute Problems: ?None ? ?Chronic Problems Addressed Today: ?Aortic stenosis ? ?Echocardiogram in 2021 showed mild AS ?No complaints of chest pain or shortness of breath ? ?Barrett's esophagus without dysplasia ?Follows with GI ?Takes OTC Pepcid for control of GERD symptoms ?Not a good candidate for long-term PPI due to severe osteoporosis ? ?Age-related osteoporosis without current pathological fracture ?Follows with orthopedics ?Maintenance with [facility] ? ?Erythropoietin deficiency anemia ?Hemoglobin stable between 10 and 11 over the past 7 months ?Follows with hematology ?Gets EPO ? ?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HCC) ?PHQ-9 less than 9 ?Stable on citalopram ? ?Mixed hyperlipidemia ?Last lipid 10/2021 ?Although moderately elevated, given age and already on statin medication ?Would continue pravastatin 40 mg daily ?Check lipid panel in 1 year ? ?Vertigo ?Stable on OTC meclizine as needed ? ? ?  ?Subjective:  ?HPI: ?Here to establish care with new PCP. ?Patient presents to follow-up with refills on citalopram and pravastatin. ?Patient follows with orthopedics for multiple fractures and severe osteoporosis.  She is currently working with PT to work on balance and lower extremity strength training.  ?Patient works on diet including spinach and high-protein such as lean meats like chicken. ?Patient has no complaints of chest pain, shortness of breath. ? ? ?   ?  ?Objective:  ?Physical Exam: ?BP 120/62 (BP Location: Left Arm, Patient Position: Sitting, Cuff Size: Normal)   Pulse 66   Temp (!) 97.3 ?F (36.3 ?C) (Temporal)   Ht 5' 4.5" (1.638 m)   Wt 161 lb 6.4 oz (73.2 kg)   SpO2 97%   BMI 27.28 kg/m?   ?Gen: No acute distress, resting comfortably ?CV: Regular rate and rhythm with no murmurs appreciated ?Pulm: Normal work of breathing, clear to auscultation bilaterally with no crackles, wheezes, or  rhonchi ?Neuro: Grossly normal, moves all extremities ?Psych: Normal affect and thought content ? ?   ? ?Madelon Lips ?02/17/2022 4:07 PM  ?

## 2022-02-17 NOTE — Patient Instructions (Signed)
Is a pleasure to meet you today. ?We have refilled your escitalopram and pravastatin. ? ?

## 2022-02-17 NOTE — Assessment & Plan Note (Signed)
Follows with orthopedics ?Maintenance with [facility] ?

## 2022-02-17 NOTE — Assessment & Plan Note (Signed)
?  Echocardiogram in 2021 showed mild AS ?No complaints of chest pain or shortness of breath ?

## 2022-02-17 NOTE — Assessment & Plan Note (Signed)
Hemoglobin stable between 10 and 11 over the past 7 months ?Follows with hematology ?Gets EPO ?

## 2022-02-17 NOTE — Assessment & Plan Note (Signed)
Follows with GI ?Takes OTC Pepcid for control of GERD symptoms ?Not a good candidate for long-term PPI due to severe osteoporosis ?

## 2022-02-17 NOTE — Assessment & Plan Note (Signed)
PHQ-9 less than 9 ?Stable on citalopram ?

## 2022-02-17 NOTE — Assessment & Plan Note (Signed)
Stable on OTC meclizine as needed ?

## 2022-02-17 NOTE — Assessment & Plan Note (Signed)
Last lipid 10/2021 ?Although moderately elevated, given age and already on statin medication ?Would continue pravastatin 40 mg daily ?Check lipid panel in 1 year ?

## 2022-02-20 ENCOUNTER — Encounter: Payer: Self-pay | Admitting: Physical Therapy

## 2022-02-20 ENCOUNTER — Ambulatory Visit: Payer: Medicare Other | Attending: Orthopedic Surgery | Admitting: Physical Therapy

## 2022-02-20 DIAGNOSIS — R296 Repeated falls: Secondary | ICD-10-CM | POA: Insufficient documentation

## 2022-02-20 DIAGNOSIS — R262 Difficulty in walking, not elsewhere classified: Secondary | ICD-10-CM | POA: Diagnosis not present

## 2022-02-20 DIAGNOSIS — M6281 Muscle weakness (generalized): Secondary | ICD-10-CM | POA: Diagnosis not present

## 2022-02-20 DIAGNOSIS — M25661 Stiffness of right knee, not elsewhere classified: Secondary | ICD-10-CM | POA: Diagnosis not present

## 2022-02-20 NOTE — Therapy (Signed)
[facility] ?[place] ?[address] ?[place], [place], [address] ?Phone: [phone]   Fax:  [phone] ? ?Physical Therapy Treatment ? ?Patient Details  ?Name: Kim Grant ?MRN: [id number] ?Date of Birth: January 14, 1941 ?Referring Provider (PT): Daniel Nones ? ? ?Encounter Date: 02/20/2022 ? ? PT End of Session - 02/20/22 1429   ? ? Visit Number 6   ? Date for PT Re-Evaluation 05/04/22   ? Authorization Type BCBS MC   ? PT Start Time 0354   ? PT Stop Time 1440   ? PT Time Calculation (min) 45 min   ? Activity Tolerance Patient tolerated treatment well   ? Behavior During Therapy [facility] for tasks assessed/performed   ? ?  ?  ? ?  ? ? ?Past Medical History:  ?Diagnosis Date  ? Anterior tibialis tendinitis 04/18/2016  ? Arthritis of knee 10/19/2020  ? Formatting of this note might be different from the original. Added automatically from request for surgery [id number]  ? 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 Bilateral impacted cerumen 05/13/2020  ? Last Assessment & Plan:  Formatting of this note might be different from the original. HPI:  Complains of stopped up Bilateral ear(s). EXAM: shows cerumen impaction. PLAN: Cerumen removed with various instruments (curettes, suction) giving subjective relief.  External canals and tympanic membranes are otherwise normal.  Return as needed.  ? Chronic bronchitis ([place]) 08/04/2014  ? Closed fracture of nasal bone with routine healing 11/02/2021  ? Last Assessment & Plan:  Formatting of this note might be different from the original. Nasal fracture. Fell with facial trauma about 3 weeks ago.  CT of the face is reviewed and shows minimally displaced nasal fracture.  Denies any nasal obstruction. EXAM shows mild deviation of the nasal dorsum.  Subtle.  Intranasal exam is unremarkable. PLAN: Reassured I think everything is going to be okay with  ? Depression   ? DJD (degenerative joint disease), ankle and foot 01/10/2016  ? Fall 01/20/2017   ? Fatigue 02/04/2018  ? Fracture of surgical neck of right humerus 01/20/2017  ? Hyperlipemia   ? Mammographic microcalcification found on diagnostic imaging of breast 08/04/2014  ? Osteoporosis   ? Tear of left rotator cuff 07/26/2018  ? Formatting of this note might be different from the original. Added automatically from request for surgery [id number]  ? 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 ? ?Past Surgical History:  ?Procedure Laterality Date  ? ABDOMINAL HYSTERECTOMY    ? BREAST SURGERY    ? KNEE SURGERY Right   ? ROTATOR CUFF REPAIR    ? ? ?There were no vitals filed for this visit. ? ? Subjective Assessment - 02/20/22 1358   ? ? Subjective I was able to do the exercises, I may need a longer band to be safer.   ? Currently in Pain? No/denies   ? ?  ?  ? ?  ? ? ? ? ? ? ? ? ? ? ? ? ? ? ? ? ? ? ? ? [place] Adult PT Treatment/Exercise - 02/20/22 0001   ? ?  ? Transfers  ? Comments up and down from the floor a few times working on using bed and without anything   ?  ? Ambulation/Gait  ? Gait Comments over to the back building and did stairs full flights step over step   ?  ? Knee/Hip Exercises: Aerobic  ? Recumbent Bike level 4 x 5 minutes   ? Nustep  level 5 x 6 minutes   ?  ? Knee/Hip Exercises: Machines for Strengthening  ? Cybex Knee Extension 10# 3x10   ? Cybex Knee Flexion 25# 3x10   ? Hip Cybex 5# hip abduction and extension 2x10   ? Other Machine 10# straight arm extension 2x10 cues for posture and core actiation, 10# 2x10 rows, 20# lats 2x10   ? ?  ?  ? ?  ? ? ? ? ? ? ? ? ? ? ? ? PT Short Term Goals - 02/06/22 1452   ? ?  ? PT SHORT TERM GOAL #1  ? Title independent with initial HEP   ? Status Partially Met   ? ?  ?  ? ?  ? ? ? ? PT Long Term Goals - 02/20/22 1431   ? ?  ? PT LONG TERM GOAL #1  ? Title increase AROM of right knee to 0-120 degrees flexion   ? Status Partially Met   ?  ? PT LONG TERM GOAL #2  ? Title report being able to negotiate curbs out in the public without fear or hesitation   ?  Status Partially Met   ? ?  ?  ? ?  ? ? ? ? ? ? ? ? Plan - 02/20/22 1430   ? ? Clinical Impression Statement Patient did much better today with getting up from the floor and getting down, no real issues just a little difficult.  Able to do the stairs step over step with only a little pain and discomfort going down.   ? PT Next Visit Plan continue to push her abilities   ? Consulted and Agree with Plan of Care Patient   ? ?  ?  ? ?  ? ? ?Patient will benefit from skilled therapeutic intervention in order to improve the following deficits and impairments:  Abnormal gait, Decreased range of motion, Difficulty walking, Decreased endurance, Decreased activity tolerance, Decreased balance, Decreased strength, Improper body mechanics, Impaired flexibility, Pain, Increased muscle spasms, Dizziness ? ?Visit Diagnosis: ?Difficulty in walking, not elsewhere classified ? ?Muscle weakness (generalized) ? ?Repeated falls ? ?Stiffness of right knee, not elsewhere classified ? ? ? ? ?Problem List ?Patient Active Problem List  ? Diagnosis Date Noted  ? Vertigo 02/17/2022  ? 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 Barrett's esophagus without dysplasia 11/25/2018  ? 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[place]) 05/25/2017  ? Primary osteoarthritis of left foot 12/02/2015  ? Age-related osteoporosis without current pathological fracture 08/04/2014  ? Mixed hyperlipidemia 08/04/2014  ? Aortic stenosis 08/04/2014  ? 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/2014  ? Primary osteoarthritis of right knee 06/08/2014  ? ? Kim Grant, PT ?02/20/2022, 2:32 PM ? ?[facility] ?[place] ?[address] ?[place], [place], [address] ?Phone: [phone]   Fax:  [phone] ? ?Name: Kim Grant ?MRN: [id number] ?Date of Birth: 1941-10-16 ? ? ? ?

## 2022-02-23 ENCOUNTER — Encounter: Payer: Self-pay | Admitting: Physical Therapy

## 2022-02-23 ENCOUNTER — Ambulatory Visit: Payer: Medicare Other | Admitting: Physical Therapy

## 2022-02-23 DIAGNOSIS — M6281 Muscle weakness (generalized): Secondary | ICD-10-CM

## 2022-02-23 DIAGNOSIS — R296 Repeated falls: Secondary | ICD-10-CM | POA: Diagnosis not present

## 2022-02-23 DIAGNOSIS — R262 Difficulty in walking, not elsewhere classified: Secondary | ICD-10-CM

## 2022-02-23 DIAGNOSIS — M25661 Stiffness of right knee, not elsewhere classified: Secondary | ICD-10-CM

## 2022-02-23 NOTE — Therapy (Signed)
[facility] ?[place] ?[address] ?[place], [place], [address] ?Phone: [phone]   Fax:  [phone] ? ?Physical Therapy Treatment ? ?Patient Details  ?Name: Kim Grant ?MRN: [id number] ?Date of Birth: [birth year] ?Referring Provider (PT): Daniel Nones ? ? ?Encounter Date: 02/23/2022 ? ? PT End of Session - 02/23/22 1357   ? ? Visit Number 7   ? Date for PT Re-Evaluation 05/04/22   ? Authorization Type BCBS MC   ? PT Start Time 1310   ? PT Stop Time 1355   ? PT Time Calculation (min) 45 min   ? Activity Tolerance Patient tolerated treatment well   ? Behavior During Therapy [facility] for tasks assessed/performed   ? ?  ?  ? ?  ? ? ?Past Medical History:  ?Diagnosis Date  ? Anterior tibialis tendinitis 04/18/2016  ? Arthritis of knee 10/19/2020  ? Formatting of this note might be different from the original. Added automatically from request for surgery [id number]  ? 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 Bilateral impacted cerumen 05/13/2020  ? Last Assessment & Plan:  Formatting of this note might be different from the original. HPI:  Complains of stopped up Bilateral ear(s). EXAM: shows cerumen impaction. PLAN: Cerumen removed with various instruments (curettes, suction) giving subjective relief.  External canals and tympanic membranes are otherwise normal.  Return as needed.  ? Chronic bronchitis ([place]) 08/04/2014  ? Closed fracture of nasal bone with routine healing 11/02/2021  ? Last Assessment & Plan:  Formatting of this note might be different from the original. Nasal fracture. Fell with facial trauma about 3 weeks ago.  CT of the face is reviewed and shows minimally displaced nasal fracture.  Denies any nasal obstruction. EXAM shows mild deviation of the nasal dorsum.  Subtle.  Intranasal exam is unremarkable. PLAN: Reassured I think everything is going to be okay with  ? Depression   ? DJD (degenerative joint disease), ankle and foot 01/10/2016  ? Fall 01/20/2017   ? Fatigue 02/04/2018  ? Fracture of surgical neck of right humerus 01/20/2017  ? Hyperlipemia   ? Mammographic microcalcification found on diagnostic imaging of breast 08/04/2014  ? Osteoporosis   ? Tear of left rotator cuff 07/26/2018  ? Formatting of this note might be different from the original. Added automatically from request for surgery [id number]  ? 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 ? ?Past Surgical History:  ?Procedure Laterality Date  ? ABDOMINAL HYSTERECTOMY    ? BREAST SURGERY    ? KNEE SURGERY Right   ? ROTATOR CUFF REPAIR    ? ? ?There were no vitals filed for this visit. ? ? Subjective Assessment - 02/23/22 1310   ? ? Subjective I have been trying to do some exercises at home, feel like I am getting stronger   ? Currently in Pain? No/denies   ? ?  ?  ? ?  ? ? ? ? ? ? ? ? ? ? ? ? ? ? ? ? ? ? ? ? [facility] Adult PT Treatment/Exercise - 02/23/22 0001   ? ?  ? Transfers  ? Comments outside one large lap around the back building and one smaller lap   ?  ? High Level Balance  ? High Level Balance Comments airex balance beam tandem and side stepping, on airex cone toe touches, on bosu balance standing, SLS x 5 second eaxh   ?  ? Knee/Hip Exercises: Aerobic  ? Recumbent Bike level  4 x 5 minutes   ? Nustep level 6 x 3 minutes level 5 x 2 minutes   ?  ? Knee/Hip Exercises: Machines for Strengthening  ? Cybex Leg Press 40# 2x10   ? Hip Cybex 5# hip abduction and extension 2x10   ? ?  ?  ? ?  ? ? ? ? ? ? ? ? ? ? ? ? PT Short Term Goals - 02/06/22 1452   ? ?  ? PT SHORT TERM GOAL #1  ? Title independent with initial HEP   ? Status Partially Met   ? ?  ?  ? ?  ? ? ? ? PT Long Term Goals - 02/23/22 1358   ? ?  ? PT LONG TERM GOAL #1  ? Title increase AROM of right knee to 0-120 degrees flexion   ? Status Partially Met   ? ?  ?  ? ?  ? ? ? ? ? ? ? ? Plan - 02/23/22 1357   ? ? Clinical Impression Statement Patient doing well with her walking, she is doing stuff at home.  She struggles with breathing on the walks,  some fear with balance and getting up from floor, but overall making good progress   ? PT Next Visit Plan work on her balance and function   ? Consulted and Agree with Plan of Care Patient   ? ?  ?  ? ?  ? ? ?Patient will benefit from skilled therapeutic intervention in order to improve the following deficits and impairments:  Abnormal gait, Decreased range of motion, Difficulty walking, Decreased endurance, Decreased activity tolerance, Decreased balance, Decreased strength, Improper body mechanics, Impaired flexibility, Pain, Increased muscle spasms, Dizziness ? ?Visit Diagnosis: ?Difficulty in walking, not elsewhere classified ? ?Muscle weakness (generalized) ? ?Repeated falls ? ?Stiffness of right knee, not elsewhere classified ? ? ? ? ?Problem List ?Patient Active Problem List  ? Diagnosis Date Noted  ? Vertigo 02/17/2022  ? 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 Barrett's esophagus without dysplasia 11/25/2018  ? 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[place]) 05/25/2017  ? Primary osteoarthritis of left foot 12/02/2015  ? Age-related osteoporosis without current pathological fracture 08/04/2014  ? Mixed hyperlipidemia 08/04/2014  ? Aortic stenosis 08/04/2014  ? 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/2014  ? Primary osteoarthritis of right knee 06/08/2014  ? ? Sumner Boast, PT ?02/23/2022, 1:59 PM ? ?[facility] ?[place] ?[address] ?[place], [place], [address] ?Phone: [phone]   Fax:  [phone] ? ?Name: Kim Grant ?MRN: [id number] ?Date of Birth: [birth year] ? ? ? ?

## 2022-02-27 ENCOUNTER — Encounter: Payer: Self-pay | Admitting: Physical Therapy

## 2022-02-27 ENCOUNTER — Ambulatory Visit: Payer: Medicare Other | Admitting: Physical Therapy

## 2022-02-27 DIAGNOSIS — M6281 Muscle weakness (generalized): Secondary | ICD-10-CM | POA: Diagnosis not present

## 2022-02-27 DIAGNOSIS — R262 Difficulty in walking, not elsewhere classified: Secondary | ICD-10-CM | POA: Diagnosis not present

## 2022-02-27 DIAGNOSIS — R296 Repeated falls: Secondary | ICD-10-CM

## 2022-02-27 DIAGNOSIS — M25661 Stiffness of right knee, not elsewhere classified: Secondary | ICD-10-CM | POA: Diagnosis not present

## 2022-02-27 NOTE — Therapy (Signed)
[facility] ?[place] ?[address] ?[place], [place], [address] ?Phone: [phone]   Fax:  [phone] ? ?Physical Therapy Treatment ? ?Patient Details  ?Name: Kim Grant ?MRN: [id number] ?Date of Birth: September 02, 1941 ?Referring Provider (PT): Daniel Nones ? ? ?Encounter Date: 02/27/2022 ? ? PT End of Session - 02/27/22 1439   ? ? Visit Number 8   ? Date for PT Re-Evaluation 05/04/22   ? Authorization Type BCBS MC   ? PT Start Time 1423   ? PT Stop Time 1441   ? PT Time Calculation (min) 46 min   ? Activity Tolerance Patient tolerated treatment well   ? Behavior During Therapy [facility] for tasks assessed/performed   ? ?  ?  ? ?  ? ? ?Past Medical History:  ?Diagnosis Date  ? Anterior tibialis tendinitis 04/18/2016  ? Arthritis of knee 10/19/2020  ? Formatting of this note might be different from the original. Added automatically from request for surgery [id number]  ? 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 Bilateral impacted cerumen 05/13/2020  ? Last Assessment & Plan:  Formatting of this note might be different from the original. HPI:  Complains of stopped up Bilateral ear(s). EXAM: shows cerumen impaction. PLAN: Cerumen removed with various instruments (curettes, suction) giving subjective relief.  External canals and tympanic membranes are otherwise normal.  Return as needed.  ? Chronic bronchitis ([place]) 08/04/2014  ? Closed fracture of nasal bone with routine healing 11/02/2021  ? Last Assessment & Plan:  Formatting of this note might be different from the original. Nasal fracture. Fell with facial trauma about 3 weeks ago.  CT of the face is reviewed and shows minimally displaced nasal fracture.  Denies any nasal obstruction. EXAM shows mild deviation of the nasal dorsum.  Subtle.  Intranasal exam is unremarkable. PLAN: Reassured I think everything is going to be okay with  ? Depression   ? DJD (degenerative joint disease), ankle and foot 01/10/2016  ? Fall 01/20/2017   ? Fatigue 02/04/2018  ? Fracture of surgical neck of right humerus 01/20/2017  ? Hyperlipemia   ? Mammographic microcalcification found on diagnostic imaging of breast 08/04/2014  ? Osteoporosis   ? Tear of left rotator cuff 07/26/2018  ? Formatting of this note might be different from the original. Added automatically from request for surgery [id number]  ? 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 ? ?Past Surgical History:  ?Procedure Laterality Date  ? ABDOMINAL HYSTERECTOMY    ? BREAST SURGERY    ? KNEE SURGERY Right   ? ROTATOR CUFF REPAIR    ? ? ?There were no vitals filed for this visit. ? ? Subjective Assessment - 02/27/22 1402   ? ? Subjective I have been doing well, no real issues, just tired   ? Currently in Pain? No/denies   ? ?  ?  ? ?  ? ? ? ? ? ? ? ? ? ? ? ? ? ? ? ? ? ? ? ? [facility] Adult PT Treatment/Exercise - 02/27/22 0001   ? ?  ? Transfers  ? Comments getting down and back up from floor without any outside touch of bed or chair, did this x 4 really struggled some with this   ?  ? High Level Balance  ? High Level Balance Comments side step on and off airex, side stepping over objects, tandem walking, walking ball toss   ?  ? Knee/Hip Exercises: Aerobic  ? Recumbent Bike level  4 x 5 minutes   ? Nustep level 6 x 3 minutes level 5 x 3 minutes   ?  ? Knee/Hip Exercises: Machines for Strengthening  ? Cybex Knee Extension 10# 3x10   ? Cybex Knee Flexion 25# 3x10   ? Hip Cybex 5# hip abduction and extension 2x10   ? Other Machine 10# straight arm extension 2x10 cues for posture and core activation, 10# 2x10 rows, 20# lats 2x10   ? ?  ?  ? ?  ? ? ? ? ? ? ? ? ? ? ? ? PT Short Term Goals - 02/06/22 1452   ? ?  ? PT SHORT TERM GOAL #1  ? Title independent with initial HEP   ? Status Partially Met   ? ?  ?  ? ?  ? ? ? ? PT Long Term Goals - 02/23/22 1358   ? ?  ? PT LONG TERM GOAL #1  ? Title increase AROM of right knee to 0-120 degrees flexion   ? Status Partially Met   ? ?  ?  ? ?  ? ? ? ? ? ? ? ? Plan -  02/27/22 1439   ? ? Clinical Impression Statement She struggled with the getting up from the floor today, it was done at the end of the session.  This frustrated her   ? PT Next Visit Plan work on her balance and function   ? Consulted and Agree with Plan of Care Patient   ? ?  ?  ? ?  ? ? ?Patient will benefit from skilled therapeutic intervention in order to improve the following deficits and impairments:  Abnormal gait, Decreased range of motion, Difficulty walking, Decreased endurance, Decreased activity tolerance, Decreased balance, Decreased strength, Improper body mechanics, Impaired flexibility, Pain, Increased muscle spasms, Dizziness ? ?Visit Diagnosis: ?Difficulty in walking, not elsewhere classified ? ?Muscle weakness (generalized) ? ?Repeated falls ? ?Stiffness of right knee, not elsewhere classified ? ? ? ? ?Problem List ?Patient Active Problem List  ? Diagnosis Date Noted  ? Vertigo 02/17/2022  ? 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 Barrett's esophagus without dysplasia 11/25/2018  ? 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 Primary osteoarthritis of left foot 12/02/2015  ? Age-related osteoporosis without current pathological fracture 08/04/2014  ? Mixed hyperlipidemia 08/04/2014  ? Aortic stenosis 08/04/2014  ? 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/2014  ? Primary osteoarthritis of right knee 06/08/2014  ? ? Sumner Boast, PT ?02/27/2022, 2:40 PM ? ?[facility] ?[address] ?[address] ?[place], [place], [address] ?Phone: [phone]   Fax:  [phone] ? ?Name: Kim Grant ?MRN: [id number] ?Date of Birth: Mar 10, 1941 ? ? ? ?

## 2022-03-02 ENCOUNTER — Encounter: Payer: Self-pay | Admitting: Physical Therapy

## 2022-03-02 ENCOUNTER — Ambulatory Visit: Payer: Medicare Other | Admitting: Physical Therapy

## 2022-03-02 DIAGNOSIS — R296 Repeated falls: Secondary | ICD-10-CM

## 2022-03-02 DIAGNOSIS — R262 Difficulty in walking, not elsewhere classified: Secondary | ICD-10-CM

## 2022-03-02 DIAGNOSIS — M25661 Stiffness of right knee, not elsewhere classified: Secondary | ICD-10-CM | POA: Diagnosis not present

## 2022-03-02 DIAGNOSIS — M6281 Muscle weakness (generalized): Secondary | ICD-10-CM | POA: Diagnosis not present

## 2022-03-02 NOTE — Therapy (Signed)
[facility] ?[place] [address] ?[place], [place], [address] ?Phone: [phone]   Fax:  [phone] ? ?Physical Therapy Treatment ? ?Patient Details  ?Name: Kim Grant ?MRN: [id number] ?Date of Birth: 1941/07/02 ?Referring Provider (PT): Daniel Nones ? ? ?Encounter Date: 03/02/2022 ? ? PT End of Session - 03/02/22 1359   ? ? Visit Number 9   ? Date for PT Re-Evaluation 05/04/22   ? Authorization Type BC[employer] MC   ? PT Start Time 1309   ? PT Stop Time 1355   ? PT Time Calculation (min) 46 min   ? Activity Tolerance Patient tolerated treatment well   ? Behavior During Therapy [facility] for tasks assessed/performed   ? ?  ?  ? ?  ? ? ?Past Medical History:  ?Diagnosis Date  ? Anterior tibialis tendinitis 04/18/2016  ? Arthritis of knee 10/19/2020  ? Formatting of this note might be different from the original. Added automatically from request for surgery [id number]  ? 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 Bilateral impacted cerumen 05/13/2020  ? Last Assessment & Plan:  Formatting of this note might be different from the original. HPI:  Complains of stopped up Bilateral ear(s). EXAM: shows cerumen impaction. PLAN: Cerumen removed with various instruments (curettes, suction) giving subjective relief.  External canals and tympanic membranes are otherwise normal.  Return as needed.  ? Chronic bronchitis ([place]) 08/04/2014  ? Closed fracture of nasal bone with routine healing 11/02/2021  ? Last Assessment & Plan:  Formatting of this note might be different from the original. Nasal fracture. Fell with facial trauma about 3 weeks ago.  CT of the face is reviewed and shows minimally displaced nasal fracture.  Denies any nasal obstruction. EXAM shows mild deviation of the nasal dorsum.  Subtle.  Intranasal exam is unremarkable. PLAN: Reassured I think everything is going to be okay with  ? Depression   ? DJD (degenerative joint disease), ankle and foot 01/10/2016  ? Fall 01/20/2017   ? Fatigue 02/04/2018  ? Fracture of surgical neck of right humerus 01/20/2017  ? Hyperlipemia   ? Mammographic microcalcification found on diagnostic imaging of breast 08/04/2014  ? Osteoporosis   ? Tear of left rotator cuff 07/26/2018  ? Formatting of this note might be different from the original. Added automatically from request for surgery [id number]  ? 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 ? ?Past Surgical History:  ?Procedure Laterality Date  ? ABDOMINAL HYSTERECTOMY    ? BREAST SURGERY    ? KNEE SURGERY Right   ? ROTATOR CUFF REPAIR    ? ? ?There were no vitals filed for this visit. ? ? Subjective Assessment - 03/02/22 1332   ? ? Subjective Tired but I was frustrated that I could not get up last time   ? Currently in Pain? No/denies   ? ?  ?  ? ?  ? ? ? ? ? ? ? ? ? ? ? ? ? ? ? ? ? ? ? ? [facility] Adult PT Treatment/Exercise - 03/02/22 0001   ? ?  ? Transfers  ? Comments getting down and back up from floor without any outside touch of bed or chair, did this x 4 really struggled some with this   ?  ? High Level Balance  ? High Level Balance Comments on airex high step touches iwth toes, then step on and of side stepping   ?  ? Knee/Hip Exercises: Aerobic  ? Recumbent  Bike level 5 x 5 minutes   ? Nustep level 6 x 3 minutes level 5 x 3 minutes   ?  ? Knee/Hip Exercises: Machines for Strengthening  ? Cybex Knee Extension 10# 3x10   ? Cybex Knee Flexion 25# 3x10   ? Hip Cybex 5# hip abduction and extension 2x10   ? Other Machine 10# straight arm extension 2x10 cues for posture and core activation, 10# 2x10 rows, 20# lats 2x10   ?  ? Knee/Hip Exercises: Standing  ? Other Standing Knee Exercises 20# farmer carry   ? ?  ?  ? ?  ? ? ? ? ? ? ? ? ? ? ? ? PT Short Term Goals - 02/06/22 1452   ? ?  ? PT SHORT TERM GOAL #1  ? Title independent with initial HEP   ? Status Partially Met   ? ?  ?  ? ?  ? ? ? ? PT Long Term Goals - 03/02/22 1401   ? ?  ? PT LONG TERM GOAL #1  ? Title increase AROM of right knee to 0-120  degrees flexion   ? Status Partially Met   ?  ? PT LONG TERM GOAL #2  ? Title report being able to negotiate curbs out in the public without fear or hesitation   ? Status Partially Met   ?  ? PT LONG TERM GOAL #3  ? Title increase hip and knee strength to 4+/5   ? Status Partially Met   ? ?  ?  ? ?  ? ? ? ? ? ? ? ? Plan - 03/02/22 1400   ? ? Clinical Impression Statement we did the getting up front the floor first today, she was able to do with less difficulty, she did have an issue catching her toe stepping onto the mat.  Was able to do SLS for 15 seconds on each leg.   ? PT Next Visit Plan work on her balance and function   ? Consulted and Agree with Plan of Care Patient   ? ?  ?  ? ?  ? ? ?Patient will benefit from skilled therapeutic intervention in order to improve the following deficits and impairments:  Abnormal gait, Decreased range of motion, Difficulty walking, Decreased endurance, Decreased activity tolerance, Decreased balance, Decreased strength, Improper body mechanics, Impaired flexibility, Pain, Increased muscle spasms, Dizziness ? ?Visit Diagnosis: ?Difficulty in walking, not elsewhere classified ? ?Muscle weakness (generalized) ? ?Repeated falls ? ?Stiffness of right knee, not elsewhere classified ? ? ? ? ?Problem List ?Patient Active Problem List  ? Diagnosis Date Noted  ? Vertigo 02/17/2022  ? 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 Barrett's esophagus without dysplasia 11/25/2018  ? 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 Primary osteoarthritis of left foot 12/02/2015  ? Age-related osteoporosis without current pathological fracture 08/04/2014  ? Mixed hyperlipidemia 08/04/2014  ? Aortic stenosis 08/04/2014  ? 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/2014  ? Primary osteoarthritis of right knee 06/08/2014  ? ? Sumner Boast, PT ?03/02/2022, 2:02 PM ? ?[facility] ?[address] ?[address] ?[place], [place], [address] ?Phone: [phone]    Fax:  [phone] ? ?Name: Matteson Blue ?MRN: [id number] ?Date of Birth: 07-11-1941 ? ? ? ?

## 2022-03-07 DIAGNOSIS — H40053 Ocular hypertension, bilateral: Secondary | ICD-10-CM | POA: Diagnosis not present

## 2022-03-07 DIAGNOSIS — Z961 Presence of intraocular lens: Secondary | ICD-10-CM | POA: Diagnosis not present

## 2022-03-07 DIAGNOSIS — H2640 Unspecified secondary cataract: Secondary | ICD-10-CM | POA: Diagnosis not present

## 2022-03-10 ENCOUNTER — Ambulatory Visit: Payer: Medicare Other | Admitting: Physical Therapy

## 2022-03-10 ENCOUNTER — Encounter: Payer: Self-pay | Admitting: Physical Therapy

## 2022-03-10 DIAGNOSIS — R1310 Dysphagia, unspecified: Secondary | ICD-10-CM | POA: Diagnosis not present

## 2022-03-10 DIAGNOSIS — R262 Difficulty in walking, not elsewhere classified: Secondary | ICD-10-CM

## 2022-03-10 DIAGNOSIS — M6281 Muscle weakness (generalized): Secondary | ICD-10-CM

## 2022-03-10 DIAGNOSIS — M25661 Stiffness of right knee, not elsewhere classified: Secondary | ICD-10-CM | POA: Diagnosis not present

## 2022-03-10 DIAGNOSIS — K2270 Barrett's esophagus without dysplasia: Secondary | ICD-10-CM | POA: Diagnosis not present

## 2022-03-10 DIAGNOSIS — R296 Repeated falls: Secondary | ICD-10-CM

## 2022-03-10 DIAGNOSIS — K219 Gastro-esophageal reflux disease without esophagitis: Secondary | ICD-10-CM | POA: Diagnosis not present

## 2022-03-10 NOTE — Therapy (Signed)
Kim Grant. [place], [place], [address] Phone: [phone]   Fax:  [phone] Progress Note Reporting Period 02/02/22 to 03/10/22  See note below for Objective Data and Assessment of Progress/Goals.     Physical Therapy Treatment  Patient Details  Name: Kim Grant MRN: [id number] Date of Birth: Oct 23, 1941 Referring Provider (PT): Daniel Nones   Encounter Date: 03/10/2022   PT End of Session - 03/10/22 1112     Visit Number 10    Date for PT Re-Evaluation 05/04/22    Authorization Type [employer] MC    PT Start Time 1057    PT Stop Time 1146    PT Time Calculation (min) 49 min    Activity Tolerance Patient tolerated treatment well    Behavior During Therapy [facility] for tasks assessed/performed             Past Medical History:  Diagnosis Date   Anterior tibialis tendinitis 04/18/2016   Arthritis of knee 10/19/2020   Formatting of this note might be different from the original. 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 HPI:  Complains of stopped up Bilateral ear(s). EXAM: shows cerumen impaction. PLAN: Cerumen removed with various instruments (curettes, suction) giving subjective relief.  External canals and tympanic membranes are otherwise normal.  Return as needed.   Chronic bronchitis ([place]) 08/04/2014   Closed fracture of nasal bone with routine healing 11/02/2021   Last Assessment & Plan:  Formatting of this note might be different from the original. Nasal fracture. Fell with facial trauma about 3 weeks ago.  CT of the face is reviewed and shows minimally displaced nasal fracture.  Denies any nasal obstruction. EXAM shows mild deviation of the nasal dorsum.  Subtle.  Intranasal exam is unremarkable. PLAN: Reassured I think  everything is going to be okay with   Depression    DJD (degenerative joint disease), ankle and foot 01/10/2016   Fall 01/20/2017   Fatigue 02/04/2018   Fracture of surgical neck of right humerus 01/20/2017   Hyperlipemia    Mammographic microcalcification found on diagnostic imaging of breast 08/04/2014   Osteoporosis    Tear of left rotator cuff 07/26/2018   Formatting of this note might be different from the original. 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   Subjective Assessment - 03/10/22 1120     Subjective Tired, I have been doing well    Currently in Pain? No/denies                               [facility] Adult PT Treatment/Exercise - 03/10/22 0001       Transfers   Comments up and down from floor, tried on double folded thick mat and a double folded thin mat, she did much better today      Knee/Hip Exercises: Aerobic   Recumbent Bike level 5 x 5 minutes      Knee/Hip Exercises: Machines for Strengthening  Other Machine 10# straight arm extension 2x10 cues for posture and core activation, 20# 2x10 rows, 25# lats 2x10      Knee/Hip Exercises: Prone   Other Prone Exercises worked woth her on the right knee ROM in prone, in sitting and then tried som echilds pose to gain ROM                       PT Short Term Goals - 02/06/22 1452       PT SHORT TERM GOAL #1   Title independent with initial HEP    Status Partially Met               PT Long Term Goals - 03/10/22 1146       PT LONG TERM GOAL #1   Title increase AROM of right knee to 0-120 degrees flexion    Status Partially Met                   Plan - 03/10/22 1146     Clinical Impression Statement She  reported issues witht he right knee getting up and down from the floor due to decreased ROM, we worked some on ROM for this, this seemed to help and allowed easier up and down from the floor.  She did well with balance but still relly has difficulty with the higher level    PT Next Visit Plan work on her balance and function    Consulted and Agree with Plan of Care Patient             Patient will benefit from skilled therapeutic intervention in order to improve the following deficits and impairments:  Abnormal gait, Decreased range of motion, Difficulty walking, Decreased endurance, Decreased activity tolerance, Decreased balance, Decreased strength, Improper body mechanics, Impaired flexibility, Pain, Increased muscle spasms, Dizziness  Visit Diagnosis: Difficulty in walking, not elsewhere classified  Muscle weakness (generalized)  Repeated falls  Stiffness of right knee, not elsewhere classified     Problem List Patient Active Problem List   Diagnosis Date Noted   Vertigo 02/17/2022   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 [place], [place], [address] Phone: [phone]   Fax:  [phone]  Name: Kim Grant MRN: [id number] Date of Birth: Apr 04, 1941

## 2022-03-17 ENCOUNTER — Encounter: Payer: Self-pay | Admitting: Physical Therapy

## 2022-03-17 ENCOUNTER — Ambulatory Visit: Payer: Medicare Other | Admitting: Physical Therapy

## 2022-03-17 DIAGNOSIS — M25661 Stiffness of right knee, not elsewhere classified: Secondary | ICD-10-CM | POA: Diagnosis not present

## 2022-03-17 DIAGNOSIS — R262 Difficulty in walking, not elsewhere classified: Secondary | ICD-10-CM | POA: Diagnosis not present

## 2022-03-17 DIAGNOSIS — R296 Repeated falls: Secondary | ICD-10-CM | POA: Diagnosis not present

## 2022-03-17 DIAGNOSIS — M6281 Muscle weakness (generalized): Secondary | ICD-10-CM | POA: Diagnosis not present

## 2022-03-17 NOTE — Therapy (Signed)
[place]. [place], [place], [address] Phone: [phone]   Fax:  [phone]  Physical Therapy Treatment  Patient Details  Name: Kim Grant MRN: [id number] Date of Birth: [birth year] Referring Provider (PT): Daniel Nones   Encounter Date: 03/17/2022   PT End of Session - 03/17/22 1100     Visit Number 11    Date for PT Re-Evaluation 05/04/22    Authorization Type [employer] MC    PT Start Time 1055    PT Stop Time 1145    PT Time Calculation (min) 50 min    Activity Tolerance Patient tolerated treatment well    Behavior During Therapy [facility] for tasks assessed/performed             Past Medical History:  Diagnosis Date   Anterior tibialis tendinitis 04/18/2016   Arthritis of knee 10/19/2020   Formatting of this note might be different from the original. 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 HPI:  Complains of stopped up Bilateral ear(s). EXAM: shows cerumen impaction. PLAN: Cerumen removed with various instruments (curettes, suction) giving subjective relief.  External canals and tympanic membranes are otherwise normal.  Return as needed.   Chronic bronchitis ([place]) 08/04/2014   Closed fracture of nasal bone with routine healing 11/02/2021   Last Assessment & Plan:  Formatting of this note might be different from the original. Nasal fracture. Fell with facial trauma about 3 weeks ago.  CT of the face is reviewed and shows minimally displaced nasal fracture.  Denies any nasal obstruction. EXAM shows mild deviation of the nasal dorsum.  Subtle.  Intranasal exam is unremarkable. PLAN: Reassured I think everything is going to be okay with   Depression    DJD (degenerative joint disease), ankle and foot 01/10/2016   Fall  01/20/2017   Fatigue 02/04/2018   Fracture of surgical neck of right humerus 01/20/2017   Hyperlipemia    Mammographic microcalcification found on diagnostic imaging of breast 08/04/2014   Osteoporosis    Tear of left rotator cuff 07/26/2018   Formatting of this note might be different from the original. 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   Subjective Assessment - 03/17/22 1101     Subjective I was tired and a little sore after the last visit    Currently in Pain? No/denies                               [facility] Adult PT Treatment/Exercise - 03/17/22 0001       High Level Balance   High Level Balance Comments side stepping on and off airex, on airex ball toss, side stepping over objects      Knee/Hip Exercises: Stretches   Sports administrator Right;3 reps;20 seconds      Knee/Hip Exercises: Aerobic   Recumbent Bike level 5 x 5 minutes    Nustep level 5 x 5 minutes  Knee/Hip Exercises: Machines for Strengthening   Cybex Knee Flexion 25# 3x10    Hip Cybex 5# hip abduction and extension 2x10    Other Machine 10# straight arm extension 2x10 cues for posture and core activation, 20# 2x10 rows, 25# lats 2x10                       PT Short Term Goals - 02/06/22 1452       PT SHORT TERM GOAL #1   Title independent with initial HEP    Status Partially Met               PT Long Term Goals - 03/17/22 1152       PT LONG TERM GOAL #1   Title increase AROM of right knee to 0-120 degrees flexion    Status Achieved                   Plan - 03/17/22 1152     Clinical Impression Statement Patient reports that she was able to get up from floor today as a test without much  difficulty, she did get SOB today and after the hip exercises she was very tired and struggled on the balance stuff    PT Next Visit Plan will start to look at what she can do after PT    Consulted and Agree with Plan of Care Patient             Patient will benefit from skilled therapeutic intervention in order to improve the following deficits and impairments:  Abnormal gait, Decreased range of motion, Difficulty walking, Decreased endurance, Decreased activity tolerance, Decreased balance, Decreased strength, Improper body mechanics, Impaired flexibility, Pain, Increased muscle spasms, Dizziness  Visit Diagnosis: Difficulty in walking, not elsewhere classified  Muscle weakness (generalized)  Repeated falls  Stiffness of right knee, not elsewhere classified     Problem List Patient Active Problem List   Diagnosis Date Noted   Vertigo 02/17/2022   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 [place], [place], [address] Phone: [phone]   Fax:  [phone]  Name: Kim Grant MRN: [id number] Date of Birth: June 02, 1941

## 2022-03-24 ENCOUNTER — Inpatient Hospital Stay: Payer: Medicare Other | Admitting: Family

## 2022-03-24 ENCOUNTER — Encounter: Payer: Self-pay | Admitting: Family

## 2022-03-24 ENCOUNTER — Other Ambulatory Visit: Payer: Self-pay

## 2022-03-24 ENCOUNTER — Encounter: Payer: Self-pay | Admitting: Physical Therapy

## 2022-03-24 ENCOUNTER — Inpatient Hospital Stay: Payer: Medicare Other

## 2022-03-24 ENCOUNTER — Ambulatory Visit: Payer: Medicare Other | Attending: Orthopedic Surgery | Admitting: Physical Therapy

## 2022-03-24 ENCOUNTER — Inpatient Hospital Stay: Payer: Medicare Other | Attending: Hematology & Oncology

## 2022-03-24 VITALS — BP 120/60 | HR 62 | Temp 97.9°F | Resp 18 | Ht 64.0 in | Wt 162.0 lb

## 2022-03-24 DIAGNOSIS — D509 Iron deficiency anemia, unspecified: Secondary | ICD-10-CM

## 2022-03-24 DIAGNOSIS — M6281 Muscle weakness (generalized): Secondary | ICD-10-CM | POA: Diagnosis not present

## 2022-03-24 DIAGNOSIS — R296 Repeated falls: Secondary | ICD-10-CM | POA: Diagnosis not present

## 2022-03-24 DIAGNOSIS — N189 Chronic kidney disease, unspecified: Secondary | ICD-10-CM | POA: Diagnosis not present

## 2022-03-24 DIAGNOSIS — M25561 Pain in right knee: Secondary | ICD-10-CM | POA: Insufficient documentation

## 2022-03-24 DIAGNOSIS — D631 Anemia in chronic kidney disease: Secondary | ICD-10-CM

## 2022-03-24 DIAGNOSIS — M25661 Stiffness of right knee, not elsewhere classified: Secondary | ICD-10-CM | POA: Diagnosis not present

## 2022-03-24 DIAGNOSIS — R262 Difficulty in walking, not elsewhere classified: Secondary | ICD-10-CM | POA: Diagnosis not present

## 2022-03-24 LAB — CBC WITH DIFFERENTIAL (CANCER CENTER ONLY)
Abs Immature Granulocytes: 0.03 10*3/uL (ref 0.00–0.07)
Basophils Absolute: 0.1 10*3/uL (ref 0.0–0.1)
Basophils Relative: 1 %
Eosinophils Absolute: 0.2 10*3/uL (ref 0.0–0.5)
Eosinophils Relative: 3 %
HCT: 32.4 % — ABNORMAL LOW (ref 36.0–46.0)
Hemoglobin: 10.8 g/dL — ABNORMAL LOW (ref 12.0–15.0)
Immature Granulocytes: 0 %
Lymphocytes Relative: 24 %
Lymphs Abs: 1.9 10*3/uL (ref 0.7–4.0)
MCH: 27.8 pg (ref 26.0–34.0)
MCHC: 33.3 g/dL (ref 30.0–36.0)
MCV: 83.5 fL (ref 80.0–100.0)
Monocytes Absolute: 0.6 10*3/uL (ref 0.1–1.0)
Monocytes Relative: 8 %
Neutro Abs: 5.1 10*3/uL (ref 1.7–7.7)
Neutrophils Relative %: 64 %
Platelet Count: 243 10*3/uL (ref 150–400)
RBC: 3.88 MIL/uL (ref 3.87–5.11)
RDW: 13.7 % (ref 11.5–15.5)
WBC Count: 7.8 10*3/uL (ref 4.0–10.5)
nRBC: 0 % (ref 0.0–0.2)

## 2022-03-24 LAB — CMP (CANCER CENTER ONLY)
ALT: 17 U/L (ref 0–44)
AST: 26 U/L (ref 15–41)
Albumin: 4.3 g/dL (ref 3.5–5.0)
Alkaline Phosphatase: 42 U/L (ref 38–126)
Anion gap: 8 (ref 5–15)
BUN: 24 mg/dL — ABNORMAL HIGH (ref 8–23)
CO2: 27 mmol/L (ref 22–32)
Calcium: 9.7 mg/dL (ref 8.9–10.3)
Chloride: 99 mmol/L (ref 98–111)
Creatinine: 1.12 mg/dL — ABNORMAL HIGH (ref 0.44–1.00)
GFR, Estimated: 50 mL/min — ABNORMAL LOW (ref >60)
Glucose, Bld: 110 mg/dL — ABNORMAL HIGH (ref 70–99)
Potassium: 4.4 mmol/L (ref 3.5–5.1)
Sodium: 134 mmol/L — ABNORMAL LOW (ref 135–145)
Total Bilirubin: 0.4 mg/dL (ref 0.3–1.2)
Total Protein: 6.9 g/dL (ref 6.5–8.1)

## 2022-03-24 LAB — RETICULOCYTES
Immature Retic Fract: 0.9 % — ABNORMAL LOW (ref 2.3–15.9)
RBC.: 3.86 MIL/uL — ABNORMAL LOW (ref 3.87–5.11)
Retic Count, Absolute: 25.9 10*3/uL (ref 19.0–186.0)
Retic Ct Pct: 0.7 % (ref 0.4–3.1)

## 2022-03-24 LAB — FERRITIN: Ferritin: 49 ng/mL (ref 11–307)

## 2022-03-24 MED ORDER — EPOETIN ALFA-EPBX 40000 UNIT/ML IJ SOLN
40000.0000 [IU] | Freq: Once | INTRAMUSCULAR | Status: AC
  Administered 2022-03-24: 40000 [IU] via SUBCUTANEOUS
  Filled 2022-03-24: qty 1

## 2022-03-24 NOTE — Progress Notes (Signed)
Hematology and Oncology Follow Up Visit  Kim Grant [id number] June 17, 1941 81 y.o. 03/24/2022   Principle Diagnosis:  Erythropoietin deficiency anemia    Current Therapy:        Retacrit 40,000 units SQ for Hgb < 11  Interim History:  Kim Grant is here today for follow-up. She is doing well but has noted an increase in fatigue this week.  She has been working hard in PT and is seeing the results! She is much more flexible and steady on her feet.  No blood loss, abnormal bruising or petechiae.  No fever, chills, n/v, cough, rash, dizziness, SOB, chest pain, palpitations, abdominal pain or changes in bowel or bladder habits.  No swelling, tenderness, numbness or tingling in her extremities at this time.  No falls or syncope.  Appetite and hydration are good. Weight is stable at 162 lbs.   ECOG Performance Status: 1 - Symptomatic but completely ambulatory  Medications:  Allergies as of 03/24/2022   No Known Allergies      Medication List        Accurate as of March 24, 2022  2:17 PM. If you have any questions, ask your nurse or doctor.          calcium citrate-vitamin D 315-200 MG-UNIT tablet Commonly known as: CITRACAL+D Take 3 tablets by mouth daily.   citalopram 40 MG tablet Commonly known as: CELEXA Take 1 tablet (40 mg total) by mouth daily.   denosumab 60 MG/ML Sosy injection Commonly known as: PROLIA Inject 60 mg into the skin every 6 (six) months.   famotidine 10 MG tablet Commonly known as: PEPCID Take 20 mg by mouth 2 (two) times daily.   ibuprofen 200 MG tablet Commonly known as: ADVIL Take 200 mg by mouth every 6 (six) hours as needed for mild pain or moderate pain.   meclizine 25 MG tablet Commonly known as: ANTIVERT Take 1 tablet (25 mg total) by mouth 3 (three) times daily as needed for dizziness.   melatonin 5 MG Tabs Take 5 mg by mouth at bedtime as needed.   multivitamin with minerals Tabs tablet Take 1 tablet by mouth daily.   pravastatin  40 MG tablet Commonly known as: PRAVACHOL Take 1 tablet (40 mg total) by mouth daily.        Allergies: No Known Allergies  Past Medical History, Surgical history, Social history, and Family History were reviewed and updated.  Review of Systems: All other 10 point review of systems is negative.   Physical Exam:  height is 5\' 4"  (1.626 m) and weight is 162 lb (73.5 kg). Her oral temperature is 97.9 F (36.6 C). Her blood pressure is 120/60 and her pulse is 62. Her respiration is 18 and oxygen saturation is 100%.   Wt Readings from Last 3 Encounters:  03/24/22 162 lb (73.5 kg)  02/17/22 161 lb 6.4 oz (73.2 kg)  02/10/22 165 lb 0.6 oz (74.9 kg)    Ocular: Sclerae unicteric, pupils equal, round and reactive to light Ear-nose-throat: Oropharynx clear, dentition fair Lymphatic: No cervical or supraclavicular adenopathy Lungs no rales or rhonchi, good excursion bilaterally Heart regular rate and rhythm, no murmur appreciated Abd soft, nontender, positive bowel sounds MSK no focal spinal tenderness, no joint edema Neuro: non-focal, well-oriented, appropriate affect Breasts: Deferred   Lab Results  Component Value Date   WBC 7.8 03/24/2022   HGB 10.8 (L) 03/24/2022   HCT 32.4 (L) 03/24/2022   MCV 83.5 03/24/2022   PLT 243 03/24/2022  Lab Results  Component Value Date   FERRITIN 50 02/10/2022   IRON 78 02/10/2022   TIBC 358 02/10/2022   UIBC 280 02/10/2022   IRONPCTSAT 22 02/10/2022   Lab Results  Component Value Date   RETICCTPCT 0.7 03/24/2022   RBC 3.88 03/24/2022   RBC 3.86 (L) 03/24/2022   No results found for: KPAFRELGTCHN, LAMBDASER, KAPLAMBRATIO No results found for: IGGSERUM, IGA, IGMSERUM No results found for: Ronnald Ramp, A1GS, A2GS, Violet Baldy, MSPIKE, SPEI   Chemistry      Component Value Date/Time   NA 132 (L) 02/10/2022 1335   K 4.6 02/10/2022 1335   CL 99 02/10/2022 1335   CO2 29 02/10/2022 1335   BUN 20 02/10/2022 1335    CREATININE 0.96 02/10/2022 1335      Component Value Date/Time   CALCIUM 9.4 02/10/2022 1335   ALKPHOS 44 02/10/2022 1335   AST 22 02/10/2022 1335   ALT 16 02/10/2022 1335   BILITOT 0.3 02/10/2022 1335       Impression and Plan: Kim Grant is a very pleasant 81 yo caucasian female with erythropoietin deficiency anemia.  ESA given, Hgb 10.8.  Iron studies are pending.  Lab check and injection every 6 weeks and follow-up in 3 months.   Kim Dawson, NP 6/2/20232:17 PM

## 2022-03-24 NOTE — Therapy (Signed)
[place]. [place], [place], [address] Phone: [phone]   Fax:  [phone]  Physical Therapy Treatment  Patient Details  Name: Kim Grant MRN: [id number] Date of Birth: 06-11-1941 Referring Provider (PT): Daniel Nones   Encounter Date: 03/24/2022   PT End of Session - 03/24/22 1010     Visit Number 12    Date for PT Re-Evaluation 05/04/22    Authorization Type [employer] MC    PT Start Time 1005    PT Stop Time 8309    PT Time Calculation (min) 48 min    Activity Tolerance Patient tolerated treatment well    Behavior During Therapy [facility] for tasks assessed/performed             Past Medical History:  Diagnosis Date   Anterior tibialis tendinitis 04/18/2016   Arthritis of knee 10/19/2020   Formatting of this note might be different from the original. Added automatically from request for surgery [id number]   Asymptomatic postprocedural ovarian failure 08/04/2014   Bilateral impacted cerumen 05/13/2020   Last Assessment & Plan:  Formatting of this note might be different from the original. HPI:  Complains of stopped up Bilateral ear(s). EXAM: shows cerumen impaction. PLAN: Cerumen removed with various instruments (curettes, suction) giving subjective relief.  External canals and tympanic membranes are otherwise normal.  Return as needed.   Chronic bronchitis ([place]) 08/04/2014   Closed fracture of nasal bone with routine healing 11/02/2021   Last Assessment & Plan:  Formatting of this note might be different from the original. Nasal fracture. Fell with facial trauma about 3 weeks ago.  CT of the face is reviewed and shows minimally displaced nasal fracture.  Denies any nasal obstruction. EXAM shows mild deviation of the nasal dorsum.  Subtle.  Intranasal exam is unremarkable. PLAN: Reassured I think everything is going to be okay with   Depression    DJD (degenerative joint disease), ankle and foot 01/10/2016   Fall 01/20/2017    Fatigue 02/04/2018   Fracture of surgical neck of right humerus 01/20/2017   Hyperlipemia    Mammographic microcalcification found on diagnostic imaging of breast 08/04/2014   Osteoporosis    Tear of left rotator cuff 07/26/2018   Formatting of this note might be different from the original. 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   Subjective Assessment - 03/24/22 1010     Subjective Tired today, feel a little 'loopy", repors sore left shoulder    Currently in Pain? No/denies                               [facility] Adult PT Treatment/Exercise - 03/24/22 0001       Ambulation/Gait   Gait Comments outside two laps  decent pace      Knee/Hip Exercises: Machines for Strengthening   Cybex Knee Flexion 25# 3x10    Hip Cybex 5# hip abduction and extension 2x10, 30# resisted gait all directions    Other Machine 10# straight arm extension 2x10 cues for posture and core activation, 20# 2x10 rows, 25# lats 2x10  PT Short Term Goals - 02/06/22 1452       PT SHORT TERM GOAL #1   Title independent with initial HEP    Status Partially Met               PT Long Term Goals - 03/24/22 1011       PT LONG TERM GOAL #2   Title report being able to negotiate curbs out in the public without fear or hesitation    Status Achieved                   Plan - 03/24/22 1047     Clinical Impression Statement Patient really doing well, reports that she has practiced up and down from the floor without difficulty at home, still with weak hips  and core    PT Next Visit Plan starting the plan for after PT    Consulted and Agree with Plan of Care Patient              Patient will benefit from skilled therapeutic intervention in order to improve the following deficits and impairments:  Abnormal gait, Decreased range of motion, Difficulty walking, Decreased endurance, Decreased activity tolerance, Decreased balance, Decreased strength, Improper body mechanics, Impaired flexibility, Pain, Increased muscle spasms, Dizziness  Visit Diagnosis: Difficulty in walking, not elsewhere classified  Muscle weakness (generalized)  Repeated falls  Stiffness of right knee, not elsewhere classified     Problem List Patient Active Problem List   Diagnosis Date Noted   Vertigo 02/17/2022   Erythropoietin deficiency anemia 12/08/2020   Barrett's esophagus without dysplasia 11/25/2018   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 [place], [place], [address] Phone: [phone]   Fax:  [phone]  Name: Kim Grant MRN: [id number] Date of Birth: January 23, 1941

## 2022-03-24 NOTE — Patient Instructions (Signed)
Epoetin Alfa injection ?What is this medication? ?EPOETIN ALFA (e POE e tin AL fa) helps your body make more red blood cells. This medicine is used to treat anemia caused by chronic kidney disease, cancer chemotherapy, or HIV-therapy. It may also be used before surgery if you have anemia. ?This medicine may be used for other purposes; ask your health care provider or pharmacist if you have questions. ?COMMON BRAND NAME(S): Epogen, Procrit, Retacrit ?What should I tell my care team before I take this medication? ?They need to know if you have any of these conditions: ?cancer ?heart disease ?high blood pressure ?history of blood clots ?history of stroke ?low levels of folate, iron, or vitamin B12 in the blood ?seizures ?an unusual or allergic reaction to erythropoietin, albumin, benzyl alcohol, hamster proteins, other medicines, foods, dyes, or preservatives ?pregnant or trying to get pregnant ?breast-feeding ?How should I use this medication? ?This medicine is for injection into a vein or under the skin. It is usually given by a health care professional in a hospital or clinic setting. ?If you get this medicine at home, you will be taught how to prepare and give this medicine. Use exactly as directed. Take your medicine at regular intervals. Do not take your medicine more often than directed. ?It is important that you put your used needles and syringes in a special sharps container. Do not put them in a trash can. If you do not have a sharps container, call your pharmacist or healthcare provider to get one. ?A special MedGuide will be given to you by the pharmacist with each prescription and refill. Be sure to read this information carefully each time. ?Talk to your pediatrician regarding the use of this medicine in children. While this drug may be prescribed for selected conditions, precautions do apply. ?Overdosage: If you think you have taken too much of this medicine contact a poison control center or emergency  room at once. ?NOTE: This medicine is only for you. Do not share this medicine with others. ?What if I miss a dose? ?If you miss a dose, take it as soon as you can. If it is almost time for your next dose, take only that dose. Do not take double or extra doses. ?What may interact with this medication? ?Interactions have not been studied. ?This list may not describe all possible interactions. Give your health care provider a list of all the medicines, herbs, non-prescription drugs, or dietary supplements you use. Also tell them if you smoke, drink alcohol, or use illegal drugs. Some items may interact with your medicine. ?What should I watch for while using this medication? ?Your condition will be monitored carefully while you are receiving this medicine. ?You may need blood work done while you are taking this medicine. ?This medicine may cause a decrease in vitamin B6. You should make sure that you get enough vitamin B6 while you are taking this medicine. Discuss the foods you eat and the vitamins you take with your health care professional. ?What side effects may I notice from receiving this medication? ?Side effects that you should report to your doctor or health care professional as soon as possible: ?allergic reactions like skin rash, itching or hives, swelling of the face, lips, or tongue ?seizures ?signs and symptoms of a blood clot such as breathing problems; changes in vision; chest pain; severe, sudden headache; pain, swelling, warmth in the leg; trouble speaking; sudden numbness or weakness of the face, arm or leg ?signs and symptoms of a stroke like   changes in vision; confusion; trouble speaking or understanding; severe headaches; sudden numbness or weakness of the face, arm or leg; trouble walking; dizziness; loss of balance or coordination ?Side effects that usually do not require medical attention (report to your doctor or health care professional if they continue or are  bothersome): ?chills ?cough ?dizziness ?fever ?headaches ?joint pain ?muscle cramps ?muscle pain ?nausea, vomiting ?pain, redness, or irritation at site where injected ?This list may not describe all possible side effects. Call your doctor for medical advice about side effects. You may report side effects to FDA at 1-800-FDA-1088. ?Where should I keep my medication? ?Keep out of the reach of children. ?Store in a refrigerator between 2 and 8 degrees C (36 and 46 degrees F). Do not freeze or shake. Throw away any unused portion if using a single-dose vial. Multi-dose vials can be kept in the refrigerator for up to 21 days after the initial dose. Throw away unused medicine. ?NOTE: This sheet is a summary. It may not cover all possible information. If you have questions about this medicine, talk to your doctor, pharmacist, or health care provider. ?? 2023 Elsevier/Gold Standard (2017-06-12 00:00:00) ? ?

## 2022-03-27 ENCOUNTER — Ambulatory Visit: Payer: Medicare Other | Admitting: Physical Therapy

## 2022-03-27 LAB — IRON AND IRON BINDING CAPACITY (CC-WL,HP ONLY)
Iron: 107 ug/dL (ref 28–170)
Saturation Ratios: 29 % (ref 10.4–31.8)
TIBC: 372 ug/dL (ref 250–450)
UIBC: 265 ug/dL (ref 148–442)

## 2022-03-30 ENCOUNTER — Ambulatory Visit: Payer: Medicare Other | Admitting: Physical Therapy

## 2022-03-30 ENCOUNTER — Encounter: Payer: Self-pay | Admitting: Physical Therapy

## 2022-03-30 DIAGNOSIS — R262 Difficulty in walking, not elsewhere classified: Secondary | ICD-10-CM

## 2022-03-30 DIAGNOSIS — M6281 Muscle weakness (generalized): Secondary | ICD-10-CM

## 2022-03-30 DIAGNOSIS — M25661 Stiffness of right knee, not elsewhere classified: Secondary | ICD-10-CM

## 2022-03-30 DIAGNOSIS — R296 Repeated falls: Secondary | ICD-10-CM

## 2022-03-30 DIAGNOSIS — M25561 Pain in right knee: Secondary | ICD-10-CM | POA: Diagnosis not present

## 2022-03-30 NOTE — Therapy (Signed)
[place]. [place], [place], [address] Phone: [phone]   Fax:  [phone]  Physical Therapy Treatment  Patient Details  Name: Kim Grant MRN: [id number] Date of Birth: 1941-08-08 Referring Provider (PT): Daniel Nones   Encounter Date: 03/30/2022   PT End of Session - 03/30/22 1011     Visit Number 13    Date for PT Re-Evaluation 05/04/22    Authorization Type [employer]    PT Start Time 1008    PT Stop Time 1054    PT Time Calculation (min) 46 min    Activity Tolerance Patient tolerated treatment well    Behavior During Therapy [facility] for tasks assessed/performed             Past Medical History:  Diagnosis Date   Anterior tibialis tendinitis 04/18/2016   Arthritis of knee 10/19/2020   Formatting of this note might be different from the original. 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 HPI:  Complains of stopped up Bilateral ear(s). EXAM: shows cerumen impaction. PLAN: Cerumen removed with various instruments (curettes, suction) giving subjective relief.  External canals and tympanic membranes are otherwise normal.  Return as needed.   Chronic bronchitis ([place]) 08/04/2014   Closed fracture of nasal bone with routine healing 11/02/2021   Last Assessment & Plan:  Formatting of this note might be different from the original. Nasal fracture. Fell with facial trauma about 3 weeks ago.  CT of the face is reviewed and shows minimally displaced nasal fracture.  Denies any nasal obstruction. EXAM shows mild deviation of the nasal dorsum.  Subtle.  Intranasal exam is unremarkable. PLAN: Reassured I think everything is going to be okay with   Depression    DJD (degenerative joint disease), ankle and foot 01/10/2016   Fall 01/20/2017    Fatigue 02/04/2018   Fracture of surgical neck of right humerus 01/20/2017   Hyperlipemia    Mammographic microcalcification found on diagnostic imaging of breast 08/04/2014   Osteoporosis    Tear of left rotator cuff 07/26/2018   Formatting of this note might be different from the original. 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   Subjective Assessment - 03/30/22 1012     Subjective I think I am doing better, still some issues with balance and fatigue    Currently in Pain? No/denies                               [facility] Adult PT Treatment/Exercise - 03/30/22 0001       Knee/Hip Exercises: Aerobic   Recumbent Bike level 5 x 6 minutes    Nustep level 4 x 7 minutes      Knee/Hip Exercises: Machines for Strengthening   Cybex Knee Extension 5# 3x10    Cybex Knee Flexion 25# 3x10    Cybex Leg Press 20# 2x10    Other Machine 10# straight arm extension 2x10 cues for posture  and core activation, 20# 2x10 rows, 25# lats 2x10      Knee/Hip Exercises: Standing   Other Standing Knee Exercises 20# farmer carry                       PT Short Term Goals - 02/06/22 1452       PT SHORT TERM GOAL #1   Title independent with initial HEP    Status Partially Met               PT Long Term Goals - 03/30/22 1155       PT LONG TERM GOAL #3   Title increase hip and knee strength to 4+/5    Status Partially Met                   Plan - 03/30/22 1155     Clinical Impression Statement Started working on her independent gym program and trying to educate her in how to use the machines and adjust, she has a lot of difficulty with the leg press and some of the other adjustments,  At this  point she is not fully safe and does not understand the machines    PT Next Visit Plan I have exercises that I have written up and will try to have her follow at the next visit             Patient will benefit from skilled therapeutic intervention in order to improve the following deficits and impairments:  Abnormal gait, Decreased range of motion, Difficulty walking, Decreased endurance, Decreased activity tolerance, Decreased balance, Decreased strength, Improper body mechanics, Impaired flexibility, Pain, Increased muscle spasms, Dizziness  Visit Diagnosis: Difficulty in walking, not elsewhere classified  Muscle weakness (generalized)  Repeated falls  Stiffness of right knee, not elsewhere classified     Problem List Patient Active Problem List   Diagnosis Date Noted   Vertigo 02/17/2022   Erythropoietin deficiency anemia 12/08/2020   Barrett's esophagus without dysplasia 11/25/2018   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 [place], [place], [address] Phone: [phone]   Fax:  [phone]  Name: Kim Grant MRN: [id number] Date of Birth: 11-17-1940

## 2022-04-03 ENCOUNTER — Ambulatory Visit: Payer: Medicare Other | Admitting: Physical Therapy

## 2022-04-03 ENCOUNTER — Encounter: Payer: Self-pay | Admitting: Physical Therapy

## 2022-04-03 DIAGNOSIS — R262 Difficulty in walking, not elsewhere classified: Secondary | ICD-10-CM

## 2022-04-03 DIAGNOSIS — R296 Repeated falls: Secondary | ICD-10-CM

## 2022-04-03 DIAGNOSIS — M6281 Muscle weakness (generalized): Secondary | ICD-10-CM

## 2022-04-03 DIAGNOSIS — M25661 Stiffness of right knee, not elsewhere classified: Secondary | ICD-10-CM | POA: Diagnosis not present

## 2022-04-03 DIAGNOSIS — M810 Age-related osteoporosis without current pathological fracture: Secondary | ICD-10-CM | POA: Diagnosis not present

## 2022-04-03 DIAGNOSIS — M25561 Pain in right knee: Secondary | ICD-10-CM | POA: Diagnosis not present

## 2022-04-03 NOTE — Therapy (Signed)
[place]. [place], [place], [address] Phone: [phone]   Fax:  [phone]  Physical Therapy Treatment  Patient Details  Name: Kim Grant MRN: [id number] Date of Birth: 1941-01-02 Referring Provider (PT): Daniel Nones   Encounter Date: 04/03/2022   PT End of Session - 04/03/22 1409     Visit Number 14    Date for PT Re-Evaluation 05/04/22    Authorization Type [employer] MC    PT Start Time 0254    PT Stop Time 1440    PT Time Calculation (min) 45 min    Activity Tolerance Patient tolerated treatment well    Behavior During Therapy [facility] for tasks assessed/performed             Past Medical History:  Diagnosis Date   Anterior tibialis tendinitis 04/18/2016   Arthritis of knee 10/19/2020   Formatting of this note might be different from the original. Added automatically from request for surgery [id number]   Asymptomatic postprocedural ovarian failure 08/04/2014   Bilateral impacted cerumen 05/13/2020   Last Assessment & Plan:  Formatting of this note might be different from the original. HPI:  Complains of stopped up Bilateral ear(s). EXAM: shows cerumen impaction. PLAN: Cerumen removed with various instruments (curettes, suction) giving subjective relief.  External canals and tympanic membranes are otherwise normal.  Return as needed.   Chronic bronchitis ([place]) 08/04/2014   Closed fracture of nasal bone with routine healing 11/02/2021   Last Assessment & Plan:  Formatting of this note might be different from the original. Nasal fracture. Fell with facial trauma about 3 weeks ago.  CT of the face is reviewed and shows minimally displaced nasal fracture.  Denies any nasal obstruction. EXAM shows mild deviation of the nasal dorsum.  Subtle.  Intranasal exam is unremarkable. PLAN: Reassured I think everything is going to be okay with   Depression    DJD (degenerative joint disease), ankle and foot 01/10/2016   Fall  01/20/2017   Fatigue 02/04/2018   Fracture of surgical neck of right humerus 01/20/2017   Hyperlipemia    Mammographic microcalcification found on diagnostic imaging of breast 08/04/2014   Osteoporosis    Tear of left rotator cuff 07/26/2018   Formatting of this note might be different from the original. 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   Subjective Assessment - 04/03/22 1409     Subjective I don't feel good a little dizzy, I checked her BP it was 110/70 she told me this was low    Currently in Pain? No/denies                               [facility] Adult PT Treatment/Exercise - 04/03/22 0001       Knee/Hip Exercises: Aerobic   Recumbent Bike level 5 x 6 minutes    Nustep level 4 x 7 minutes    Other Aerobic UBE level 2 x 2 minutes      Knee/Hip Exercises: Machines for Strengthening   Cybex Knee Flexion 25# 3x10    Cybex Leg Press 20# 2x10  Other Machine 10# straight arm extension 2x10 cues for posture and core activation, 20# 2x10 rows, 25# lats 2x10                       PT Short Term Goals - 02/06/22 1452       PT SHORT TERM GOAL #1   Title independent with initial HEP    Status Partially Met               PT Long Term Goals - 03/30/22 1155       PT LONG TERM GOAL #3   Title increase hip and knee strength to 4+/5    Status Partially Met                   Plan - 04/03/22 1410     Clinical Impression Statement Patient reported not feeling great today, BP was a little low.  She did report some pain last week with the knee extension, all exercises today were me trying to educate her on independent use and safety, needs a lot of verbal instruction now to get  things right, she really struggles with this    PT Next Visit Plan still struggling with the exercises try to get her feeling more confident in this    Consulted and Agree with Plan of Care Patient             Patient will benefit from skilled therapeutic intervention in order to improve the following deficits and impairments:  Abnormal gait, Decreased range of motion, Difficulty walking, Decreased endurance, Decreased activity tolerance, Decreased balance, Decreased strength, Improper body mechanics, Impaired flexibility, Pain, Increased muscle spasms, Dizziness  Visit Diagnosis: Difficulty in walking, not elsewhere classified  Muscle weakness (generalized)  Repeated falls  Stiffness of right knee, not elsewhere classified     Problem List Patient Active Problem List   Diagnosis Date Noted   Vertigo 02/17/2022   Erythropoietin deficiency anemia 12/08/2020   Barrett's esophagus without dysplasia 11/25/2018   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 [place], [place], [address] Phone: [phone]   Fax:  [phone]  Name: Kim Grant MRN: [id number] Date of Birth: Jan 30, 1941

## 2022-04-06 ENCOUNTER — Encounter: Payer: Self-pay | Admitting: Physical Therapy

## 2022-04-06 ENCOUNTER — Ambulatory Visit: Payer: Medicare Other | Admitting: Physical Therapy

## 2022-04-06 DIAGNOSIS — M25661 Stiffness of right knee, not elsewhere classified: Secondary | ICD-10-CM | POA: Diagnosis not present

## 2022-04-06 DIAGNOSIS — M6281 Muscle weakness (generalized): Secondary | ICD-10-CM | POA: Diagnosis not present

## 2022-04-06 DIAGNOSIS — M25561 Pain in right knee: Secondary | ICD-10-CM | POA: Diagnosis not present

## 2022-04-06 DIAGNOSIS — R296 Repeated falls: Secondary | ICD-10-CM

## 2022-04-06 DIAGNOSIS — R262 Difficulty in walking, not elsewhere classified: Secondary | ICD-10-CM

## 2022-04-06 NOTE — Therapy (Signed)
[place]. [place], [place], [address] Phone: [phone]   Fax:  [phone]  Physical Therapy Treatment  Patient Details  Name: Kim Grant MRN: [id number] Date of Birth: 1941-03-19 Referring Provider (PT): Daniel Nones   Encounter Date: 04/06/2022   PT End of Session - 04/06/22 1405     Visit Number 15    Date for PT Re-Evaluation 05/04/22    Authorization Type [employer] MC    PT Start Time 1400    PT Stop Time 9702    PT Time Calculation (min) 45 min    Activity Tolerance Patient tolerated treatment well    Behavior During Therapy [facility] for tasks assessed/performed             Past Medical History:  Diagnosis Date   Anterior tibialis tendinitis 04/18/2016   Arthritis of knee 10/19/2020   Formatting of this note might be different from the original. Added automatically from request for surgery [id number]   Asymptomatic postprocedural ovarian failure 08/04/2014   Bilateral impacted cerumen 05/13/2020   Last Assessment & Plan:  Formatting of this note might be different from the original. HPI:  Complains of stopped up Bilateral ear(s). EXAM: shows cerumen impaction. PLAN: Cerumen removed with various instruments (curettes, suction) giving subjective relief.  External canals and tympanic membranes are otherwise normal.  Return as needed.   Chronic bronchitis ([place]) 08/04/2014   Closed fracture of nasal bone with routine healing 11/02/2021   Last Assessment & Plan:  Formatting of this note might be different from the original. Nasal fracture. Fell with facial trauma about 3 weeks ago.  CT of the face is reviewed and shows minimally displaced nasal fracture.  Denies any nasal obstruction. EXAM shows mild deviation of the nasal dorsum.  Subtle.  Intranasal exam is unremarkable. PLAN: Reassured I think everything is going to be okay with   Depression    DJD (degenerative joint disease), ankle and foot 01/10/2016   Fall  01/20/2017   Fatigue 02/04/2018   Fracture of surgical neck of right humerus 01/20/2017   Hyperlipemia    Mammographic microcalcification found on diagnostic imaging of breast 08/04/2014   Osteoporosis    Tear of left rotator cuff 07/26/2018   Formatting of this note might be different from the original. 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   Subjective Assessment - 04/06/22 1410     Subjective Doing well, feeling better today    Currently in Pain? No/denies                               [facility] Adult PT Treatment/Exercise - 04/06/22 0001       High Level Balance   High Level Balance Comments side stepping on and off airex, on airex ball toss, side stepping over objects      Knee/Hip Exercises: Aerobic   Recumbent Bike level 5 x 6 minutes    Nustep level 4 x 7 minutes    Other Aerobic UBE level 2 x 2 minutes      Knee/Hip Exercises: Machines for Strengthening   Cybex Leg Press  20# 2x10                       PT Short Term Goals - 02/06/22 1452       PT SHORT TERM GOAL #1   Title independent with initial HEP    Status Partially Met               PT Long Term Goals - 04/06/22 1425       PT LONG TERM GOAL #4   Title go up and down stairs step over step    Status Partially Met                   Plan - 04/06/22 1425     Clinical Impression Statement Patient feeling better today, most of the treatment was me instructing her in the use of the machines and then her following through with adjusting and performing the exercises.  I did some balance with her as well.  She is getting better but I think one more visit and she will be more indendent and safer.    PT Next Visit  Plan possible one more visit to assure safety and ability    Consulted and Agree with Plan of Care Patient             Patient will benefit from skilled therapeutic intervention in order to improve the following deficits and impairments:  Abnormal gait, Decreased range of motion, Difficulty walking, Decreased endurance, Decreased activity tolerance, Decreased balance, Decreased strength, Improper body mechanics, Impaired flexibility, Pain, Increased muscle spasms, Dizziness  Visit Diagnosis: Difficulty in walking, not elsewhere classified  Muscle weakness (generalized)  Repeated falls  Stiffness of right knee, not elsewhere classified     Problem List Patient Active Problem List   Diagnosis Date Noted   Vertigo 02/17/2022   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 [place], [place], [address] Phone: [phone]   Fax:  [phone]  Name: Kim Grant MRN: [id number] Date of Birth: Sep 14, 1941

## 2022-04-10 ENCOUNTER — Encounter: Payer: Self-pay | Admitting: Physical Therapy

## 2022-04-10 ENCOUNTER — Ambulatory Visit: Payer: Medicare Other | Admitting: Physical Therapy

## 2022-04-10 DIAGNOSIS — R296 Repeated falls: Secondary | ICD-10-CM | POA: Diagnosis not present

## 2022-04-10 DIAGNOSIS — R262 Difficulty in walking, not elsewhere classified: Secondary | ICD-10-CM

## 2022-04-10 DIAGNOSIS — M25661 Stiffness of right knee, not elsewhere classified: Secondary | ICD-10-CM | POA: Diagnosis not present

## 2022-04-10 DIAGNOSIS — M6281 Muscle weakness (generalized): Secondary | ICD-10-CM | POA: Diagnosis not present

## 2022-04-10 DIAGNOSIS — M25561 Pain in right knee: Secondary | ICD-10-CM | POA: Diagnosis not present

## 2022-04-10 NOTE — Therapy (Signed)
[place]. [place], [place], [address] Phone: [phone]   Fax:  [phone]  Physical Therapy Treatment  Patient Details  Name: Kim Grant MRN: [id number] Date of Birth: [birth year] Referring Provider (PT): Daniel Nones   Encounter Date: 04/10/2022   PT End of Session - 04/10/22 1402     Visit Number 16    Date for PT Re-Evaluation 05/04/22    Authorization Type [employer] MC    PT Start Time 0762    PT Stop Time 1440    PT Time Calculation (min) 46 min    Activity Tolerance Patient tolerated treatment well    Behavior During Therapy [facility] for tasks assessed/performed             Past Medical History:  Diagnosis Date   Anterior tibialis tendinitis 04/18/2016   Arthritis of knee 10/19/2020   Formatting of this note might be different from the original. 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 HPI:  Complains of stopped up Bilateral ear(s). EXAM: shows cerumen impaction. PLAN: Cerumen removed with various instruments (curettes, suction) giving subjective relief.  External canals and tympanic membranes are otherwise normal.  Return as needed.   Chronic bronchitis ([place]) 08/04/2014   Closed fracture of nasal bone with routine healing 11/02/2021   Last Assessment & Plan:  Formatting of this note might be different from the original. Nasal fracture. Fell with facial trauma about 3 weeks ago.  CT of the face is reviewed and shows minimally displaced nasal fracture.  Denies any nasal obstruction. EXAM shows mild deviation of the nasal dorsum.  Subtle.  Intranasal exam is unremarkable. PLAN: Reassured I think everything is going to be okay with   Depression    DJD (degenerative joint disease), ankle and foot 01/10/2016   Fall  01/20/2017   Fatigue 02/04/2018   Fracture of surgical neck of right humerus 01/20/2017   Hyperlipemia    Mammographic microcalcification found on diagnostic imaging of breast 08/04/2014   Osteoporosis    Tear of left rotator cuff 07/26/2018   Formatting of this note might be different from the original. Added automatically from request for surgery [id number]   Total knee replacement status, right 04/11/2021    Past Surgical History:  Procedure Laterality Date   ABDOMINAL HYSTERECTOMY     BREAST SURGERY     KNEE SURGERY Right    ROTATOR CUFF REPAIR      There were no vitals filed for this visit.   Subjective Assessment - 04/10/22 1403     Subjective I forget everything once the weekend goes by, on how to adjust these machines.    Currently in Pain? No/denies                               [facility] Adult PT Treatment/Exercise - 04/10/22 0001       Knee/Hip Exercises: Aerobic   Recumbent Bike level 5 x 6 minutes    Nustep level 4 x 7 minutes    Other Aerobic UBE level 2 x 3 minutes      Knee/Hip Exercises: Machines for Strengthening   Cybex Knee Flexion 25# 3x10    Cybex Leg Press 20# 2x10    Other Machine 10# straight arm extension  2x10 cues for posture and core activation, 20# 2x10 rows, 25# lats 2x10                       PT Short Term Goals - 02/06/22 1452       PT SHORT TERM GOAL #1   Title independent with initial HEP    Status Partially Met               PT Long Term Goals - 04/06/22 1425       PT LONG TERM GOAL #4   Title go up and down stairs step over step    Status Partially Met                   Plan - 04/10/22 1457     Clinical Impression Statement Patient really struggled today with coming back and being able to remember the machines and how to adjust needing multiple cues on the machines and how to do this.  I went back toward the end of the session and had her re do some of the machines, for repitition.     PT Next Visit Plan I wrote up her new form and will go over next visit and D/c    Consulted and Agree with Plan of Care Patient             Patient will benefit from skilled therapeutic intervention in order to improve the following deficits and impairments:  Abnormal gait, Decreased range of motion, Difficulty walking, Decreased endurance, Decreased activity tolerance, Decreased balance, Decreased strength, Improper body mechanics, Impaired flexibility, Pain, Increased muscle spasms, Dizziness  Visit Diagnosis: Difficulty in walking, not elsewhere classified  Muscle weakness (generalized)  Repeated falls  Stiffness of right knee, not elsewhere classified     Problem List Patient Active Problem List   Diagnosis Date Noted   Vertigo 02/17/2022   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 [place], [place], [address] Phone: [phone]   Fax:  [phone]  Name: Laylaa Guevarra MRN: [id number] Date of Birth: 1940-11-10

## 2022-04-12 DIAGNOSIS — H90A32 Mixed conductive and sensorineural hearing loss, unilateral, left ear with restricted hearing on the contralateral side: Secondary | ICD-10-CM | POA: Diagnosis not present

## 2022-04-12 DIAGNOSIS — H9190 Unspecified hearing loss, unspecified ear: Secondary | ICD-10-CM | POA: Diagnosis not present

## 2022-04-12 DIAGNOSIS — H6982 Other specified disorders of Eustachian tube, left ear: Secondary | ICD-10-CM | POA: Diagnosis not present

## 2022-04-13 ENCOUNTER — Ambulatory Visit: Payer: Medicare Other | Admitting: Physical Therapy

## 2022-04-13 ENCOUNTER — Encounter: Payer: Self-pay | Admitting: Physical Therapy

## 2022-04-13 DIAGNOSIS — R296 Repeated falls: Secondary | ICD-10-CM

## 2022-04-13 DIAGNOSIS — M25561 Pain in right knee: Secondary | ICD-10-CM

## 2022-04-13 DIAGNOSIS — M25661 Stiffness of right knee, not elsewhere classified: Secondary | ICD-10-CM

## 2022-04-13 DIAGNOSIS — R262 Difficulty in walking, not elsewhere classified: Secondary | ICD-10-CM

## 2022-04-13 DIAGNOSIS — M6281 Muscle weakness (generalized): Secondary | ICD-10-CM

## 2022-04-13 NOTE — Therapy (Signed)
Kim Grant. [place], [place], [address] Phone: [phone]   Fax:  [phone]  Physical Therapy Treatment  Patient Details  Name: Kim Grant MRN: [id number] Date of Birth: 01-Apr-1941 Referring Provider (PT): Daniel Nones   Encounter Date: 04/13/2022   PT End of Session - 04/13/22 1402     Visit Number 17    Date for PT Re-Evaluation 05/04/22    Authorization Type [employer]    PT Start Time 9798    PT Stop Time 1444    PT Time Calculation (min) 47 min    Activity Tolerance Patient tolerated treatment well    Behavior During Therapy [facility] for tasks assessed/performed             Past Medical History:  Diagnosis Date   Anterior tibialis tendinitis 04/18/2016   Arthritis of knee 10/19/2020   Formatting of this note might be different from the original. Added automatically from request for surgery [id number]   Asymptomatic postprocedural ovarian failure 08/04/2014   Bilateral impacted cerumen 05/13/2020   Last Assessment & Plan:  Formatting of this note might be different from the original. HPI:  Complains of stopped up Bilateral ear(s). EXAM: shows cerumen impaction. PLAN: Cerumen removed with various instruments (curettes, suction) giving subjective relief.  External canals and tympanic membranes are otherwise normal.  Return as needed.   Chronic bronchitis ([place]) 08/04/2014   Closed fracture of nasal bone with routine healing 11/02/2021   Last Assessment & Plan:  Formatting of this note might be different from the original. Nasal fracture. Fell with facial trauma about 3 weeks ago.  CT of the face is reviewed and shows minimally displaced nasal fracture.  Denies any nasal obstruction. EXAM shows mild deviation of the nasal dorsum.  Subtle.  Intranasal exam is unremarkable. PLAN: Reassured I think everything is going to be okay with   Depression    DJD (degenerative joint disease), ankle and foot 01/10/2016   Fall  01/20/2017   Fatigue 02/04/2018   Fracture of surgical neck of right humerus 01/20/2017   Hyperlipemia    Mammographic microcalcification found on diagnostic imaging of breast 08/04/2014   Osteoporosis    Tear of left rotator cuff 07/26/2018   Formatting of this note might be different from the original. 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   Subjective Assessment - 04/13/22 1402     Subjective I think I am doing okay, I want to see if I can remember these things today    Currently in Pain? No/denies                               [facility] Adult PT Treatment/Exercise - 04/13/22 0001       Knee/Hip Exercises: Aerobic   Recumbent Bike level 5 x 6 minutes    Nustep level 4 x 7 minutes    Other Aerobic UBE level 2 x 3 minutes                     PT Education - 04/13/22 1456     Education Details  Educated patine on the use of the gym equipment, filled out a flow sheet for her.  had her try to follow and offered assist as needed    Person(s) Educated Patient    Methods Explanation;Demonstration;Verbal cues;Tactile cues;Handout    Comprehension Verbalized understanding;Returned demonstration;Verbal cues required              PT Short Term Goals - 02/06/22 1452       PT SHORT TERM GOAL #1   Title independent with initial HEP    Status Partially Met               PT Long Term Goals - 04/13/22 1402       PT LONG TERM GOAL #1   Title increase AROM of right knee to 0-120 degrees flexion    Status Achieved      PT LONG TERM GOAL #2   Title report being able to negotiate curbs out in the public without fear or hesitation    Status Achieved      PT LONG TERM GOAL #3   Title increase  hip and knee strength to 4+/5    Status Achieved      PT LONG TERM GOAL #4   Title go up and down stairs step over step    Status Achieved      PT LONG TERM GOAL #5   Title resume a fitness/gym program    Status Achieved                   Plan - 04/13/22 1457     Clinical Impression Statement Patient did better today with the independent gym set up, still required some assist and guidance, she is close to independetn and will try next week on her own.  Some of the machines are a little difficult for her to set up.    PT Next Visit Plan D/C with her trying the independent gym program, all goals met    Consulted and Agree with Plan of Care Patient             Patient will benefit from skilled therapeutic intervention in order to improve the following deficits and impairments:  Abnormal gait, Decreased range of motion, Difficulty walking, Decreased endurance, Decreased activity tolerance, Decreased balance, Decreased strength, Improper body mechanics, Impaired flexibility, Pain, Increased muscle spasms, Dizziness  Visit Diagnosis: Difficulty in walking, not elsewhere classified  Muscle weakness (generalized)  Repeated falls  Stiffness of right knee, not elsewhere classified  Acute pain of right knee     Problem List Patient Active Problem List   Diagnosis Date Noted   Vertigo 02/17/2022   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 [place], [place],  [address] Phone: [phone]   Fax:  [phone]  Name: Kim Grant MRN: [id number] Date of Birth: [birth year]

## 2022-05-05 ENCOUNTER — Inpatient Hospital Stay: Payer: Medicare Other | Attending: Hematology & Oncology

## 2022-05-05 ENCOUNTER — Inpatient Hospital Stay: Payer: Medicare Other

## 2022-05-05 VITALS — BP 132/69 | HR 70 | Temp 97.8°F | Resp 18

## 2022-05-05 DIAGNOSIS — D509 Iron deficiency anemia, unspecified: Secondary | ICD-10-CM

## 2022-05-05 DIAGNOSIS — D631 Anemia in chronic kidney disease: Secondary | ICD-10-CM | POA: Diagnosis not present

## 2022-05-05 DIAGNOSIS — N189 Chronic kidney disease, unspecified: Secondary | ICD-10-CM | POA: Diagnosis not present

## 2022-05-05 LAB — CMP (CANCER CENTER ONLY)
ALT: 21 U/L (ref 0–44)
AST: 28 U/L (ref 15–41)
Albumin: 4.4 g/dL (ref 3.5–5.0)
Alkaline Phosphatase: 44 U/L (ref 38–126)
Anion gap: 5 (ref 5–15)
BUN: 22 mg/dL (ref 8–23)
CO2: 28 mmol/L (ref 22–32)
Calcium: 9.5 mg/dL (ref 8.9–10.3)
Chloride: 99 mmol/L (ref 98–111)
Creatinine: 1.1 mg/dL — ABNORMAL HIGH (ref 0.44–1.00)
GFR, Estimated: 51 mL/min — ABNORMAL LOW (ref >60)
Glucose, Bld: 92 mg/dL (ref 70–99)
Potassium: 5.4 mmol/L — ABNORMAL HIGH (ref 3.5–5.1)
Sodium: 132 mmol/L — ABNORMAL LOW (ref 135–145)
Total Bilirubin: 0.4 mg/dL (ref 0.3–1.2)
Total Protein: 6.4 g/dL — ABNORMAL LOW (ref 6.5–8.1)

## 2022-05-05 LAB — CBC WITH DIFFERENTIAL (CANCER CENTER ONLY)
Abs Immature Granulocytes: 0.02 10*3/uL (ref 0.00–0.07)
Basophils Absolute: 0.1 10*3/uL (ref 0.0–0.1)
Basophils Relative: 1 %
Eosinophils Absolute: 0.2 10*3/uL (ref 0.0–0.5)
Eosinophils Relative: 3 %
HCT: 33.4 % — ABNORMAL LOW (ref 36.0–46.0)
Hemoglobin: 10.7 g/dL — ABNORMAL LOW (ref 12.0–15.0)
Immature Granulocytes: 0 %
Lymphocytes Relative: 28 %
Lymphs Abs: 1.8 10*3/uL (ref 0.7–4.0)
MCH: 27 pg (ref 26.0–34.0)
MCHC: 32 g/dL (ref 30.0–36.0)
MCV: 84.3 fL (ref 80.0–100.0)
Monocytes Absolute: 0.5 10*3/uL (ref 0.1–1.0)
Monocytes Relative: 7 %
Neutro Abs: 3.9 10*3/uL (ref 1.7–7.7)
Neutrophils Relative %: 61 %
Platelet Count: 280 10*3/uL (ref 150–400)
RBC: 3.96 MIL/uL (ref 3.87–5.11)
RDW: 13.7 % (ref 11.5–15.5)
WBC Count: 6.5 10*3/uL (ref 4.0–10.5)
nRBC: 0 % (ref 0.0–0.2)

## 2022-05-05 MED ORDER — EPOETIN ALFA-EPBX 40000 UNIT/ML IJ SOLN
40000.0000 [IU] | Freq: Once | INTRAMUSCULAR | Status: AC
  Administered 2022-05-05: 40000 [IU] via SUBCUTANEOUS
  Filled 2022-05-05: qty 1

## 2022-05-05 NOTE — Patient Instructions (Signed)
Epoetin Alfa injection ?What is this medication? ?EPOETIN ALFA (e POE e tin AL fa) helps your body make more red blood cells. This medicine is used to treat anemia caused by chronic kidney disease, cancer chemotherapy, or HIV-therapy. It may also be used before surgery if you have anemia. ?This medicine may be used for other purposes; ask your health care provider or pharmacist if you have questions. ?COMMON BRAND NAME(S): Epogen, Procrit, Retacrit ?What should I tell my care team before I take this medication? ?They need to know if you have any of these conditions: ?cancer ?heart disease ?high blood pressure ?history of blood clots ?history of stroke ?low levels of folate, iron, or vitamin B12 in the blood ?seizures ?an unusual or allergic reaction to erythropoietin, albumin, benzyl alcohol, hamster proteins, other medicines, foods, dyes, or preservatives ?pregnant or trying to get pregnant ?breast-feeding ?How should I use this medication? ?This medicine is for injection into a vein or under the skin. It is usually given by a health care professional in a hospital or clinic setting. ?If you get this medicine at home, you will be taught how to prepare and give this medicine. Use exactly as directed. Take your medicine at regular intervals. Do not take your medicine more often than directed. ?It is important that you put your used needles and syringes in a special sharps container. Do not put them in a trash can. If you do not have a sharps container, call your pharmacist or healthcare provider to get one. ?A special MedGuide will be given to you by the pharmacist with each prescription and refill. Be sure to read this information carefully each time. ?Talk to your pediatrician regarding the use of this medicine in children. While this drug may be prescribed for selected conditions, precautions do apply. ?Overdosage: If you think you have taken too much of this medicine contact a poison control center or emergency  room at once. ?NOTE: This medicine is only for you. Do not share this medicine with others. ?What if I miss a dose? ?If you miss a dose, take it as soon as you can. If it is almost time for your next dose, take only that dose. Do not take double or extra doses. ?What may interact with this medication? ?Interactions have not been studied. ?This list may not describe all possible interactions. Give your health care provider a list of all the medicines, herbs, non-prescription drugs, or dietary supplements you use. Also tell them if you smoke, drink alcohol, or use illegal drugs. Some items may interact with your medicine. ?What should I watch for while using this medication? ?Your condition will be monitored carefully while you are receiving this medicine. ?You may need blood work done while you are taking this medicine. ?This medicine may cause a decrease in vitamin B6. You should make sure that you get enough vitamin B6 while you are taking this medicine. Discuss the foods you eat and the vitamins you take with your health care professional. ?What side effects may I notice from receiving this medication? ?Side effects that you should report to your doctor or health care professional as soon as possible: ?allergic reactions like skin rash, itching or hives, swelling of the face, lips, or tongue ?seizures ?signs and symptoms of a blood clot such as breathing problems; changes in vision; chest pain; severe, sudden headache; pain, swelling, warmth in the leg; trouble speaking; sudden numbness or weakness of the face, arm or leg ?signs and symptoms of a stroke like   changes in vision; confusion; trouble speaking or understanding; severe headaches; sudden numbness or weakness of the face, arm or leg; trouble walking; dizziness; loss of balance or coordination ?Side effects that usually do not require medical attention (report to your doctor or health care professional if they continue or are  bothersome): ?chills ?cough ?dizziness ?fever ?headaches ?joint pain ?muscle cramps ?muscle pain ?nausea, vomiting ?pain, redness, or irritation at site where injected ?This list may not describe all possible side effects. Call your doctor for medical advice about side effects. You may report side effects to FDA at 1-800-FDA-1088. ?Where should I keep my medication? ?Keep out of the reach of children. ?Store in a refrigerator between 2 and 8 degrees C (36 and 46 degrees F). Do not freeze or shake. Throw away any unused portion if using a single-dose vial. Multi-dose vials can be kept in the refrigerator for up to 21 days after the initial dose. Throw away unused medicine. ?NOTE: This sheet is a summary. It may not cover all possible information. If you have questions about this medicine, talk to your doctor, pharmacist, or health care provider. ?? 2023 Elsevier/Gold Standard (2017-06-12 00:00:00) ? ?

## 2022-05-17 DIAGNOSIS — Z780 Asymptomatic menopausal state: Secondary | ICD-10-CM | POA: Diagnosis not present

## 2022-05-17 DIAGNOSIS — M8589 Other specified disorders of bone density and structure, multiple sites: Secondary | ICD-10-CM | POA: Diagnosis not present

## 2022-05-17 DIAGNOSIS — Z13820 Encounter for screening for osteoporosis: Secondary | ICD-10-CM | POA: Diagnosis not present

## 2022-05-17 DIAGNOSIS — M8580 Other specified disorders of bone density and structure, unspecified site: Secondary | ICD-10-CM | POA: Diagnosis not present

## 2022-05-26 DIAGNOSIS — K222 Esophageal obstruction: Secondary | ICD-10-CM | POA: Diagnosis not present

## 2022-05-26 DIAGNOSIS — K2270 Barrett's esophagus without dysplasia: Secondary | ICD-10-CM | POA: Diagnosis not present

## 2022-05-26 DIAGNOSIS — K2950 Unspecified chronic gastritis without bleeding: Secondary | ICD-10-CM | POA: Diagnosis not present

## 2022-05-26 DIAGNOSIS — K219 Gastro-esophageal reflux disease without esophagitis: Secondary | ICD-10-CM | POA: Diagnosis not present

## 2022-05-26 DIAGNOSIS — K259 Gastric ulcer, unspecified as acute or chronic, without hemorrhage or perforation: Secondary | ICD-10-CM | POA: Diagnosis not present

## 2022-05-26 DIAGNOSIS — K449 Diaphragmatic hernia without obstruction or gangrene: Secondary | ICD-10-CM | POA: Diagnosis not present

## 2022-05-26 DIAGNOSIS — K31A19 Gastric intestinal metaplasia without dysplasia, unspecified site: Secondary | ICD-10-CM | POA: Diagnosis not present

## 2022-05-30 DIAGNOSIS — Z96651 Presence of right artificial knee joint: Secondary | ICD-10-CM | POA: Diagnosis not present

## 2022-05-31 ENCOUNTER — Inpatient Hospital Stay: Payer: Medicare Other

## 2022-05-31 ENCOUNTER — Ambulatory Visit: Payer: Medicare Other | Admitting: Family

## 2022-06-23 ENCOUNTER — Inpatient Hospital Stay: Payer: Medicare Other | Admitting: Family

## 2022-06-23 ENCOUNTER — Encounter: Payer: Self-pay | Admitting: Family

## 2022-06-23 ENCOUNTER — Inpatient Hospital Stay: Payer: Medicare Other | Attending: Hematology & Oncology

## 2022-06-23 ENCOUNTER — Inpatient Hospital Stay: Payer: Medicare Other

## 2022-06-23 VITALS — BP 126/75 | HR 66 | Temp 98.0°F | Resp 17 | Wt 165.0 lb

## 2022-06-23 DIAGNOSIS — D631 Anemia in chronic kidney disease: Secondary | ICD-10-CM

## 2022-06-23 DIAGNOSIS — N189 Chronic kidney disease, unspecified: Secondary | ICD-10-CM

## 2022-06-23 DIAGNOSIS — D509 Iron deficiency anemia, unspecified: Secondary | ICD-10-CM

## 2022-06-23 LAB — CBC WITH DIFFERENTIAL (CANCER CENTER ONLY)
Abs Immature Granulocytes: 0.02 10*3/uL (ref 0.00–0.07)
Basophils Absolute: 0 10*3/uL (ref 0.0–0.1)
Basophils Relative: 1 %
Eosinophils Absolute: 0.3 10*3/uL (ref 0.0–0.5)
Eosinophils Relative: 4 %
HCT: 32.6 % — ABNORMAL LOW (ref 36.0–46.0)
Hemoglobin: 10.7 g/dL — ABNORMAL LOW (ref 12.0–15.0)
Immature Granulocytes: 0 %
Lymphocytes Relative: 24 %
Lymphs Abs: 1.5 10*3/uL (ref 0.7–4.0)
MCH: 27.2 pg (ref 26.0–34.0)
MCHC: 32.8 g/dL (ref 30.0–36.0)
MCV: 83 fL (ref 80.0–100.0)
Monocytes Absolute: 0.5 10*3/uL (ref 0.1–1.0)
Monocytes Relative: 9 %
Neutro Abs: 3.8 10*3/uL (ref 1.7–7.7)
Neutrophils Relative %: 62 %
Platelet Count: 278 10*3/uL (ref 150–400)
RBC: 3.93 MIL/uL (ref 3.87–5.11)
RDW: 14.7 % (ref 11.5–15.5)
WBC Count: 6.1 10*3/uL (ref 4.0–10.5)
nRBC: 0 % (ref 0.0–0.2)

## 2022-06-23 LAB — CMP (CANCER CENTER ONLY)
ALT: 25 U/L (ref 0–44)
AST: 30 U/L (ref 15–41)
Albumin: 4.3 g/dL (ref 3.5–5.0)
Alkaline Phosphatase: 54 U/L (ref 38–126)
Anion gap: 8 (ref 5–15)
BUN: 18 mg/dL (ref 8–23)
CO2: 28 mmol/L (ref 22–32)
Calcium: 10 mg/dL (ref 8.9–10.3)
Chloride: 98 mmol/L (ref 98–111)
Creatinine: 1.13 mg/dL — ABNORMAL HIGH (ref 0.44–1.00)
GFR, Estimated: 49 mL/min — ABNORMAL LOW (ref >60)
Glucose, Bld: 91 mg/dL (ref 70–99)
Potassium: 4.6 mmol/L (ref 3.5–5.1)
Sodium: 134 mmol/L — ABNORMAL LOW (ref 135–145)
Total Bilirubin: 0.3 mg/dL (ref 0.3–1.2)
Total Protein: 7.1 g/dL (ref 6.5–8.1)

## 2022-06-23 LAB — FERRITIN: Ferritin: 74 ng/mL (ref 11–307)

## 2022-06-23 MED ORDER — EPOETIN ALFA-EPBX 40000 UNIT/ML IJ SOLN
40000.0000 [IU] | Freq: Once | INTRAMUSCULAR | Status: AC
  Administered 2022-06-23: 40000 [IU] via SUBCUTANEOUS
  Filled 2022-06-23: qty 1

## 2022-06-23 NOTE — Progress Notes (Signed)
Hematology and Oncology Follow Up Visit  Kim Grant [id number] 04-11-1941 81 y.o. 06/23/2022   Principle Diagnosis:  Erythropoietin deficiency anemia    Current Therapy:        Retacrit 40,000 units SQ for Hgb < 11   Interim History:  Kim Grant is here today for follow-up. She is doing well but has had some issues with dizziness from vertigo. Antivert does help her symptoms.  She notes occasional SOB with over exertion in the heat. She takes a break to rest when needed.  No falls or syncope.  Hgb stable at 10.7, MCV 83, platelets 278 and WBC count 6.1.  No obvious blood loss. No bruising or petechiae.  No fever, chills, n/v, cough, rash, dizziness, chest pain, palpitations, abdominal pain or changes in bowel or bladder habits.  No swelling, numbness or tingling in her extremities at this time.  She has tenderness in her shoulder which she states is due to arthritis.  Appetite and hydration are good. Her weight is stable at 165 lbs.   ECOG Performance Status: 1 - Symptomatic but completely ambulatory  Medications:  Allergies as of 06/23/2022   No Known Allergies      Medication List        Accurate as of June 23, 2022  2:43 PM. If you have any questions, ask your nurse or doctor.          STOP taking these medications    famotidine 10 MG tablet Commonly known as: PEPCID Stopped by: Eileen Stanford, NP       TAKE these medications    calcium citrate-vitamin D 315-200 MG-UNIT tablet Commonly known as: CITRACAL+D Take 3 tablets by mouth daily.   citalopram 40 MG tablet Commonly known as: CELEXA Take 1 tablet (40 mg total) by mouth daily.   denosumab 60 MG/ML Sosy injection Commonly known as: PROLIA Inject 60 mg into the skin every 6 (six) months.   ibuprofen 200 MG tablet Commonly known as: ADVIL Take 200 mg by mouth every 6 (six) hours as needed for mild pain or moderate pain.   meclizine 25 MG tablet Commonly known as: ANTIVERT Take 1 tablet (25 mg  total) by mouth 3 (three) times daily as needed for dizziness.   melatonin 5 MG Tabs Take 5 mg by mouth at bedtime as needed.   multivitamin with minerals Tabs tablet Take 1 tablet by mouth daily.   pantoprazole 40 MG tablet Commonly known as: PROTONIX Take 40 mg by mouth 2 (two) times daily.   pravastatin 40 MG tablet Commonly known as: PRAVACHOL Take 1 tablet (40 mg total) by mouth daily.        Allergies: No Known Allergies  Past Medical History, Surgical history, Social history, and Family History were reviewed and updated.  Review of Systems: All other 10 point review of systems is negative.   Physical Exam:  weight is 165 lb (74.8 kg). Her oral temperature is 98 F (36.7 C). Her blood pressure is 126/75 and her pulse is 66. Her respiration is 17 and oxygen saturation is 100%.   Wt Readings from Last 3 Encounters:  06/23/22 165 lb (74.8 kg)  03/24/22 162 lb (73.5 kg)  02/17/22 161 lb 6.4 oz (73.2 kg)    Ocular: Sclerae unicteric, pupils equal, round and reactive to light Ear-nose-throat: Oropharynx clear, dentition fair Lymphatic: No cervical or supraclavicular adenopathy Lungs no rales or rhonchi, good excursion bilaterally Heart regular rate and rhythm, no murmur appreciated Abd soft, nontender, positive bowel  sounds MSK no focal spinal tenderness, no joint edema Neuro: non-focal, well-oriented, appropriate affect Breasts: Deferred   Lab Results  Component Value Date   WBC 6.1 06/23/2022   HGB 10.7 (L) 06/23/2022   HCT 32.6 (L) 06/23/2022   MCV 83.0 06/23/2022   PLT 278 06/23/2022   Lab Results  Component Value Date   FERRITIN 49 03/24/2022   IRON 107 03/24/2022   TIBC 372 03/24/2022   UIBC 265 03/24/2022   IRONPCTSAT 29 03/24/2022   Lab Results  Component Value Date   RETICCTPCT 0.7 03/24/2022   RBC 3.93 06/23/2022   No results found for: "KPAFRELGTCHN", "LAMBDASER", "KAPLAMBRATIO" No results found for: "IGGSERUM", "IGA", "IGMSERUM" No  results found for: "TOTALPROTELP", "ALBUMINELP", "A1GS", "A2GS", "BETS", "BETA2SER", "GAMS", "MSPIKE", "SPEI"   Chemistry      Component Value Date/Time   NA 132 (L) 05/05/2022 1406   K 5.4 (H) 05/05/2022 1406   CL 99 05/05/2022 1406   CO2 28 05/05/2022 1406   BUN 22 05/05/2022 1406   CREATININE 1.10 (H) 05/05/2022 1406      Component Value Date/Time   CALCIUM 9.5 05/05/2022 1406   ALKPHOS 44 05/05/2022 1406   AST 28 05/05/2022 1406   ALT 21 05/05/2022 1406   BILITOT 0.4 05/05/2022 1406       Impression and Plan:  Kim Grant is a very pleasant 81 yo caucasian female with erythropoietin deficiency anemia.  ESA given, Hgb 10.7.  Iron studies pending.  Lab check every month, follow-up in 3 months.   Eileen Stanford, NP 9/1/20232:43 PM

## 2022-06-27 LAB — IRON AND IRON BINDING CAPACITY (CC-WL,HP ONLY)
Iron: 77 ug/dL (ref 28–170)
Saturation Ratios: 22 % (ref 10.4–31.8)
TIBC: 354 ug/dL (ref 250–450)
UIBC: 277 ug/dL (ref 148–442)

## 2022-06-28 LAB — RETICULOCYTES
Immature Retic Fract: 4.9 % (ref 2.3–15.9)
RBC.: 3.91 MIL/uL (ref 3.87–5.11)
Retic Count, Absolute: 30.1 10*3/uL (ref 19.0–186.0)
Retic Ct Pct: 0.7 % (ref 0.4–3.1)

## 2022-07-24 ENCOUNTER — Inpatient Hospital Stay: Payer: Medicare Other | Attending: Hematology & Oncology

## 2022-07-24 ENCOUNTER — Inpatient Hospital Stay: Payer: Medicare Other

## 2022-07-24 DIAGNOSIS — D509 Iron deficiency anemia, unspecified: Secondary | ICD-10-CM

## 2022-07-24 DIAGNOSIS — D631 Anemia in chronic kidney disease: Secondary | ICD-10-CM | POA: Diagnosis not present

## 2022-07-24 DIAGNOSIS — N189 Chronic kidney disease, unspecified: Secondary | ICD-10-CM | POA: Diagnosis not present

## 2022-07-24 LAB — CMP (CANCER CENTER ONLY)
ALT: 17 U/L (ref 0–44)
AST: 24 U/L (ref 15–41)
Albumin: 4.4 g/dL (ref 3.5–5.0)
Alkaline Phosphatase: 50 U/L (ref 38–126)
Anion gap: 8 (ref 5–15)
BUN: 21 mg/dL (ref 8–23)
CO2: 28 mmol/L (ref 22–32)
Calcium: 10.6 mg/dL — ABNORMAL HIGH (ref 8.9–10.3)
Chloride: 100 mmol/L (ref 98–111)
Creatinine: 1.14 mg/dL — ABNORMAL HIGH (ref 0.44–1.00)
GFR, Estimated: 49 mL/min — ABNORMAL LOW (ref >60)
Glucose, Bld: 91 mg/dL (ref 70–99)
Potassium: 5.1 mmol/L (ref 3.5–5.1)
Sodium: 136 mmol/L (ref 135–145)
Total Bilirubin: 0.4 mg/dL (ref 0.3–1.2)
Total Protein: 7.6 g/dL (ref 6.5–8.1)

## 2022-07-24 LAB — CBC WITH DIFFERENTIAL (CANCER CENTER ONLY)
Abs Immature Granulocytes: 0.03 10*3/uL (ref 0.00–0.07)
Basophils Absolute: 0.1 10*3/uL (ref 0.0–0.1)
Basophils Relative: 1 %
Eosinophils Absolute: 0.3 10*3/uL (ref 0.0–0.5)
Eosinophils Relative: 4 %
HCT: 34.6 % — ABNORMAL LOW (ref 36.0–46.0)
Hemoglobin: 11.1 g/dL — ABNORMAL LOW (ref 12.0–15.0)
Immature Granulocytes: 0 %
Lymphocytes Relative: 22 %
Lymphs Abs: 1.5 10*3/uL (ref 0.7–4.0)
MCH: 26.9 pg (ref 26.0–34.0)
MCHC: 32.1 g/dL (ref 30.0–36.0)
MCV: 83.8 fL (ref 80.0–100.0)
Monocytes Absolute: 0.5 10*3/uL (ref 0.1–1.0)
Monocytes Relative: 8 %
Neutro Abs: 4.5 10*3/uL (ref 1.7–7.7)
Neutrophils Relative %: 65 %
Platelet Count: 270 10*3/uL (ref 150–400)
RBC: 4.13 MIL/uL (ref 3.87–5.11)
RDW: 14.6 % (ref 11.5–15.5)
WBC Count: 6.9 10*3/uL (ref 4.0–10.5)
nRBC: 0 % (ref 0.0–0.2)

## 2022-07-31 DIAGNOSIS — I359 Nonrheumatic aortic valve disorder, unspecified: Secondary | ICD-10-CM | POA: Diagnosis not present

## 2022-07-31 DIAGNOSIS — I350 Nonrheumatic aortic (valve) stenosis: Secondary | ICD-10-CM | POA: Diagnosis not present

## 2022-07-31 DIAGNOSIS — E7800 Pure hypercholesterolemia, unspecified: Secondary | ICD-10-CM | POA: Diagnosis not present

## 2022-07-31 DIAGNOSIS — K2270 Barrett's esophagus without dysplasia: Secondary | ICD-10-CM | POA: Diagnosis not present

## 2022-08-01 DIAGNOSIS — R9431 Abnormal electrocardiogram [ECG] [EKG]: Secondary | ICD-10-CM | POA: Diagnosis not present

## 2022-08-11 ENCOUNTER — Ambulatory Visit: Payer: Medicare Other | Admitting: Family

## 2022-08-11 ENCOUNTER — Inpatient Hospital Stay: Payer: Medicare Other

## 2022-08-11 ENCOUNTER — Ambulatory Visit: Payer: Medicare Other

## 2022-08-16 ENCOUNTER — Other Ambulatory Visit: Payer: Self-pay | Admitting: Pharmacist

## 2022-08-17 ENCOUNTER — Ambulatory Visit: Payer: Medicare Other | Attending: Sports Medicine | Admitting: Physical Therapy

## 2022-08-17 ENCOUNTER — Encounter: Payer: Self-pay | Admitting: Physical Therapy

## 2022-08-17 DIAGNOSIS — Z1231 Encounter for screening mammogram for malignant neoplasm of breast: Secondary | ICD-10-CM | POA: Diagnosis not present

## 2022-08-17 DIAGNOSIS — M25511 Pain in right shoulder: Secondary | ICD-10-CM | POA: Insufficient documentation

## 2022-08-17 DIAGNOSIS — M25512 Pain in left shoulder: Secondary | ICD-10-CM | POA: Insufficient documentation

## 2022-08-17 NOTE — Therapy (Signed)
OUTPATIENT PHYSICAL THERAPY SHOULDER EVALUATION   Patient Name: Kim Grant MRN: [id number] DOB:1941-05-05, 81 y.o., female Today's Date: 08/17/2022   PT End of Session - 08/17/22 0930     Visit Number 1    Date for PT Re-Evaluation 11/17/22    Authorization Type BCBS medicare    PT Start Time 0930    PT Stop Time 1015    PT Time Calculation (min) 45 min    Activity Tolerance Patient tolerated treatment well    Behavior During Therapy [facility] for tasks assessed/performed             Past Medical History:  Diagnosis Date   Anterior tibialis tendinitis 04/18/2016   Arthritis of knee 10/19/2020   Formatting of this note might be different from the original. Added automatically from request for surgery [id number]   Asymptomatic postprocedural ovarian failure 08/04/2014   Bilateral impacted cerumen 05/13/2020   Last Assessment & Plan:  Formatting of this note might be different from the original. HPI:  Complains of stopped up Bilateral ear(s). EXAM: shows cerumen impaction. PLAN: Cerumen removed with various instruments (curettes, suction) giving subjective relief.  External canals and tympanic membranes are otherwise normal.  Return as needed.   Chronic bronchitis ([place]) 08/04/2014   Closed fracture of nasal bone with routine healing 11/02/2021   Last Assessment & Plan:  Formatting of this note might be different from the original. Nasal fracture. Fell with facial trauma about 3 weeks ago.  CT of the face is reviewed and shows minimally displaced nasal fracture.  Denies any nasal obstruction. EXAM shows mild deviation of the nasal dorsum.  Subtle.  Intranasal exam is unremarkable. PLAN: Reassured I think everything is going to be okay with   Depression    DJD (degenerative joint disease), ankle and foot 01/10/2016   Fall 01/20/2017   Fatigue 02/04/2018   Fracture of surgical neck of right humerus 01/20/2017   Hyperlipemia    Mammographic microcalcification found on diagnostic imaging of breast  08/04/2014   Osteoporosis    Tear of left rotator cuff 07/26/2018   Formatting of this note might be different from the original. 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   Barrett's esophagus without dysplasia 11/25/2018   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[place]) 05/25/2017   Primary osteoarthritis of left foot 12/02/2015   Age-related osteoporosis without current pathological fracture 08/04/2014   Mixed hyperlipidemia 08/04/2014   Aortic stenosis 08/04/2014   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/2014   Primary osteoarthritis of right knee 06/08/2014    PCP: Josephine Igo  REFERRING PROVIDER: Leslye Peer DIAG: bilateral shoulder pain  THERAPY DIAG:  Acute pain of left shoulder  Acute pain of right shoulder  Rationale for Evaluation and Treatment Rehabilitation  ONSET DATE: September 2023  SUBJECTIVE:  SUBJECTIVE STATEMENT: Patient reports taht over the past 6 months she has really noticed more shoulder pain, reports that she is having more difficulty reaching into the cabinets, dressing and doing hair. She is right hand dominant  PERTINENT HISTORY: Right shoulder humeral fracture and left shoulder RC repair  PAIN:  Are you having pain? Yes: NPRS scale: 4/10 Pain location: bilateral shoulders  Pain description: sharp, ache Aggravating factors: reaching up, out pain up to 8/10 Relieving factors: rest, OTC pain meds pain at best a 3/10  PRECAUTIONS: None  WEIGHT  BEARING RESTRICTIONS: No  FALLS:  Has patient fallen in last 6 months? No  LIVING ENVIRONMENT: Lives with: lives with their family Lives in: House/apartment Stairs: No Has following equipment at home: None  OCCUPATION: retired  PLOF: Independent and gym 2x/week reports not much walking due to "very poor stamina"  PATIENT GOALS:less pain, stronger, take less pain meds, do hair, reach easier  OBJECTIVE:   DIAGNOSTIC FINDINGS:  None recently  PATIENT SURVEYS:  FOTO 36  COGNITION: Overall cognitive status: Within functional limits for tasks assessed     SENSATION: WFL  POSTURE: Fwd head, rounded shoulders  UPPER EXTREMITY ROM:   Active ROM Right eval Left eval  Shoulder flexion 80 100  Shoulder extension    Shoulder abduction 70 60  Shoulder adduction    Shoulder internal rotation 30 10  Shoulder external rotation 40 50  Elbow flexion    Elbow extension    Wrist flexion    Wrist extension    Wrist ulnar deviation    Wrist radial deviation    Wrist pronation    Wrist supination    (Blank rows = not tested)  UPPER EXTREMITY MMT:  MMT Right eval Left eval  Shoulder flexion 3- 3-  Shoulder extension    Shoulder abduction 3- 3-  Shoulder adduction    Shoulder internal rotation 3+ 3+  Shoulder external rotation 3 3  Middle trapezius    Lower trapezius    Elbow flexion 4- 4-  Elbow extension 4- 4-  Wrist flexion    Wrist extension    Wrist ulnar deviation    Wrist radial deviation    Wrist pronation    Wrist supination    Grip strength (lbs)    (Blank rows = not tested)  SHOULDER SPECIAL TESTS: Impingement tests: Neer impingement test: positive    PALPATION:  Very tight in the upper trap, neck, very tender here and in the upper arms and biceps   TODAY'S TREATMENT:                                                                                                                           DATE: 08/18/23 Passive shoulder ROM   PATIENT  EDUCATION: Education details: POC Person educated: Patient Education method: Explanation Education comprehension: verbalized understanding  HOME EXERCISE PROGRAM: Doorway stretch   ASSESSMENT:  CLINICAL IMPRESSION: Patient is a 81 y.o. female who was seen today for  physical therapy evaluation and treatment for bilateral shoulder pain and loss of ROM and function.  She has had right humeral fracture and left RC repair all about 5 years ago, she is right handed, has more loss of motion on the right, reports dififculty cooking and doing hair, some difficulty dressing   OBJECTIVE IMPAIRMENTS: decreased balance, decreased endurance, decreased ROM, decreased strength, increased muscle spasms, impaired flexibility, impaired UE functional use, postural dysfunction, and pain.   REHAB POTENTIAL: Good  CLINICAL DECISION MAKING: Stable/uncomplicated  EVALUATION COMPLEXITY: Low   GOALS: Goals reviewed with patient? Yes  SHORT TERM GOALS: Target date: 08/31/22  Independent with initial HEP Goal status: INITIAL  LONG TERM GOALS: Target date: 11/09/22  Independent with advanced HEP Goal status: INITIAL  2.  Decrease pain overall 50% Goal status: INITIAL  3.  Report no difficulty doing hair Goal status: INITIAL  4.  Report able to cook Thanksgiving dinner without diffiucty Goal status: INITIAL  5.  Increase AROM of the shoulders by 20 degrees for all motions Goal status: INITIAL  PLAN:  PT FREQUENCY: 1-2x/week  PT DURATION: 12 weeks  PLANNED INTERVENTIONS: Therapeutic exercises, Therapeutic activity, Neuromuscular re-education, Balance training, Gait training, Patient/Family education, Self Care, Joint mobilization, Joint manipulation, Dry Needling, Electrical stimulation, Cryotherapy, Moist heat, Taping, Vasopneumatic device, and Manual therapy  PLAN FOR NEXT SESSION: PROM, start some gym, could treat pain as needed   Kire Ferg W, PT 08/17/2022, 9:31 AM

## 2022-08-21 DIAGNOSIS — I359 Nonrheumatic aortic valve disorder, unspecified: Secondary | ICD-10-CM | POA: Diagnosis not present

## 2022-08-23 ENCOUNTER — Inpatient Hospital Stay: Payer: Medicare Other

## 2022-08-23 DIAGNOSIS — K2289 Other specified disease of esophagus: Secondary | ICD-10-CM | POA: Diagnosis not present

## 2022-08-23 DIAGNOSIS — K259 Gastric ulcer, unspecified as acute or chronic, without hemorrhage or perforation: Secondary | ICD-10-CM | POA: Diagnosis not present

## 2022-08-23 DIAGNOSIS — K449 Diaphragmatic hernia without obstruction or gangrene: Secondary | ICD-10-CM | POA: Diagnosis not present

## 2022-08-23 DIAGNOSIS — Z8711 Personal history of peptic ulcer disease: Secondary | ICD-10-CM | POA: Diagnosis not present

## 2022-08-28 ENCOUNTER — Encounter: Payer: Self-pay | Admitting: Physical Therapy

## 2022-08-28 ENCOUNTER — Inpatient Hospital Stay: Payer: Medicare Other | Attending: Hematology & Oncology

## 2022-08-28 ENCOUNTER — Ambulatory Visit: Payer: Medicare Other | Attending: Sports Medicine | Admitting: Physical Therapy

## 2022-08-28 ENCOUNTER — Inpatient Hospital Stay: Payer: Medicare Other

## 2022-08-28 VITALS — BP 126/63 | HR 64 | Temp 98.0°F | Resp 18

## 2022-08-28 DIAGNOSIS — D631 Anemia in chronic kidney disease: Secondary | ICD-10-CM | POA: Insufficient documentation

## 2022-08-28 DIAGNOSIS — N189 Chronic kidney disease, unspecified: Secondary | ICD-10-CM | POA: Insufficient documentation

## 2022-08-28 DIAGNOSIS — R262 Difficulty in walking, not elsewhere classified: Secondary | ICD-10-CM | POA: Diagnosis not present

## 2022-08-28 DIAGNOSIS — M25512 Pain in left shoulder: Secondary | ICD-10-CM | POA: Insufficient documentation

## 2022-08-28 DIAGNOSIS — M25511 Pain in right shoulder: Secondary | ICD-10-CM | POA: Diagnosis not present

## 2022-08-28 DIAGNOSIS — M6281 Muscle weakness (generalized): Secondary | ICD-10-CM | POA: Diagnosis not present

## 2022-08-28 DIAGNOSIS — R296 Repeated falls: Secondary | ICD-10-CM | POA: Insufficient documentation

## 2022-08-28 DIAGNOSIS — D509 Iron deficiency anemia, unspecified: Secondary | ICD-10-CM

## 2022-08-28 LAB — CBC WITH DIFFERENTIAL (CANCER CENTER ONLY)
Abs Immature Granulocytes: 0.02 10*3/uL (ref 0.00–0.07)
Basophils Absolute: 0 10*3/uL (ref 0.0–0.1)
Basophils Relative: 1 %
Eosinophils Absolute: 0.2 10*3/uL (ref 0.0–0.5)
Eosinophils Relative: 4 %
HCT: 32.8 % — ABNORMAL LOW (ref 36.0–46.0)
Hemoglobin: 10.7 g/dL — ABNORMAL LOW (ref 12.0–15.0)
Immature Granulocytes: 0 %
Lymphocytes Relative: 28 %
Lymphs Abs: 1.7 10*3/uL (ref 0.7–4.0)
MCH: 27.7 pg (ref 26.0–34.0)
MCHC: 32.6 g/dL (ref 30.0–36.0)
MCV: 85 fL (ref 80.0–100.0)
Monocytes Absolute: 0.6 10*3/uL (ref 0.1–1.0)
Monocytes Relative: 9 %
Neutro Abs: 3.5 10*3/uL (ref 1.7–7.7)
Neutrophils Relative %: 58 %
Platelet Count: 289 10*3/uL (ref 150–400)
RBC: 3.86 MIL/uL — ABNORMAL LOW (ref 3.87–5.11)
RDW: 14.4 % (ref 11.5–15.5)
WBC Count: 6 10*3/uL (ref 4.0–10.5)
nRBC: 0 % (ref 0.0–0.2)

## 2022-08-28 LAB — CMP (CANCER CENTER ONLY)
ALT: 21 U/L (ref 0–44)
AST: 26 U/L (ref 15–41)
Albumin: 4.6 g/dL (ref 3.5–5.0)
Alkaline Phosphatase: 49 U/L (ref 38–126)
Anion gap: 8 (ref 5–15)
BUN: 27 mg/dL — ABNORMAL HIGH (ref 8–23)
CO2: 29 mmol/L (ref 22–32)
Calcium: 10.4 mg/dL — ABNORMAL HIGH (ref 8.9–10.3)
Chloride: 99 mmol/L (ref 98–111)
Creatinine: 1.16 mg/dL — ABNORMAL HIGH (ref 0.44–1.00)
GFR, Estimated: 48 mL/min — ABNORMAL LOW (ref >60)
Glucose, Bld: 101 mg/dL — ABNORMAL HIGH (ref 70–99)
Potassium: 4.9 mmol/L (ref 3.5–5.1)
Sodium: 136 mmol/L (ref 135–145)
Total Bilirubin: 0.4 mg/dL (ref 0.3–1.2)
Total Protein: 7.4 g/dL (ref 6.5–8.1)

## 2022-08-28 MED ORDER — EPOETIN ALFA-EPBX 40000 UNIT/ML IJ SOLN
40000.0000 [IU] | Freq: Once | INTRAMUSCULAR | Status: AC
  Administered 2022-08-28: 40000 [IU] via SUBCUTANEOUS
  Filled 2022-08-28: qty 1

## 2022-08-28 NOTE — Therapy (Signed)
OUTPATIENT PHYSICAL THERAPY SHOULDER EVALUATION   Patient Name: Kim Grant MRN: [id number] DOB:Aug 23, 1941, 81 y.o., female Today's Date: 08/28/2022   PT End of Session - 08/28/22 1358     Visit Number 2    Date for PT Re-Evaluation 11/17/22    Authorization Type [employer] medicare    PT Start Time 1356    PT Stop Time 1445    PT Time Calculation (min) 49 min    Activity Tolerance Patient tolerated treatment well    Behavior During Therapy [facility] for tasks assessed/performed             Past Medical History:  Diagnosis Date   Anterior tibialis tendinitis 04/18/2016   Arthritis of knee 10/19/2020   Formatting of this note might be different from the original. Added automatically from request for surgery [id number]   Asymptomatic postprocedural ovarian failure 08/04/2014   Bilateral impacted cerumen 05/13/2020   Last Assessment & Plan:  Formatting of this note might be different from the original. HPI:  Complains of stopped up Bilateral ear(s). EXAM: shows cerumen impaction. PLAN: Cerumen removed with various instruments (curettes, suction) giving subjective relief.  External canals and tympanic membranes are otherwise normal.  Return as needed.   Chronic bronchitis ([place]) 08/04/2014   Closed fracture of nasal bone with routine healing 11/02/2021   Last Assessment & Plan:  Formatting of this note might be different from the original. Nasal fracture. Fell with facial trauma about 3 weeks ago.  CT of the face is reviewed and shows minimally displaced nasal fracture.  Denies any nasal obstruction. EXAM shows mild deviation of the nasal dorsum.  Subtle.  Intranasal exam is unremarkable. PLAN: Reassured I think everything is going to be okay with   Depression    DJD (degenerative joint disease), ankle and foot 01/10/2016   Fall 01/20/2017   Fatigue 02/04/2018   Fracture of surgical neck of right humerus 01/20/2017   Hyperlipemia    Mammographic microcalcification found on diagnostic imaging of breast  08/04/2014   Osteoporosis    Tear of left rotator cuff 07/26/2018   Formatting of this note might be different from the original. 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   Barrett's esophagus without dysplasia 11/25/2018   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[place]) 05/25/2017   Primary osteoarthritis of left foot 12/02/2015   Age-related osteoporosis without current pathological fracture 08/04/2014   Mixed hyperlipidemia 08/04/2014   Aortic stenosis 08/04/2014   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/2014   Primary osteoarthritis of right knee 06/08/2014    PCP: Josephine Igo  REFERRING PROVIDER: Leslye Peer DIAG: bilateral shoulder pain  THERAPY DIAG:  Acute pain of left shoulder  Acute pain of right shoulder  Difficulty in walking, not elsewhere classified  Muscle weakness (generalized)  Repeated falls  Rationale for Evaluation and Treatment Rehabilitation  ONSET DATE: September 2023  SUBJECTIVE:  SUBJECTIVE STATEMENT: Reports some increased shoulder pain today, rated 6/10, unsure of why this is going on, reports no changes in activity  PERTINENT HISTORY: Right shoulder humeral fracture and left shoulder RC repair  PAIN:  Are you having pain? Yes: NPRS scale: 6/10 Pain location: bilateral shoulders  Pain description: sharp, ache Aggravating factors: reaching up, out pain up to 8/10 Relieving factors: rest, OTC pain meds pain at best a 3/10  PRECAUTIONS:  None  WEIGHT BEARING RESTRICTIONS: No  FALLS:  Has patient fallen in last 6 months? No  LIVING ENVIRONMENT: Lives with: lives with their family Lives in: House/apartment Stairs: No Has following equipment at home: None  OCCUPATION: retired  PLOF: Independent and gym 2x/week reports not much walking due to "very poor stamina"  PATIENT GOALS:less pain, stronger, take less pain meds, do hair, reach easier  OBJECTIVE:   DIAGNOSTIC FINDINGS:  None recently  PATIENT SURVEYS:  FOTO 36  COGNITION: Overall cognitive status: Within functional limits for tasks assessed     SENSATION: WFL  POSTURE: Fwd head, rounded shoulders  UPPER EXTREMITY ROM:   Active ROM Right eval Left eval  Shoulder flexion 80 100  Shoulder extension    Shoulder abduction 70 60  Shoulder adduction    Shoulder internal rotation 30 10  Shoulder external rotation 40 50  Elbow flexion    Elbow extension    Wrist flexion    Wrist extension    Wrist ulnar deviation    Wrist radial deviation    Wrist pronation    Wrist supination    (Blank rows = not tested)  UPPER EXTREMITY MMT:  MMT Right eval Left eval  Shoulder flexion 3- 3-  Shoulder extension    Shoulder abduction 3- 3-  Shoulder adduction    Shoulder internal rotation 3+ 3+  Shoulder external rotation 3 3  Middle trapezius    Lower trapezius    Elbow flexion 4- 4-  Elbow extension 4- 4-  Wrist flexion    Wrist extension    Wrist ulnar deviation    Wrist radial deviation    Wrist pronation    Wrist supination    Grip strength (lbs)    (Blank rows = not tested)  SHOULDER SPECIAL TESTS: Impingement tests: Neer impingement test: positive    PALPATION:  Very tight in the upper trap, neck, very tender here and in the upper arms and biceps   TODAY'S TREATMENT:                                                                                                                           DATE: 08/28/22 Nustep level 5 x 6  minutes 10# straight arm pulls Red tband ER Red tband horizontal abduction caused pain Doorway stretch Red tband rows and extension Lats 15# 2x10 Wand exercises withj some overpressure Supine passive ROM right shoulder STM to the right upper arm very tender and sore and tight in the pectoral   08/18/23 Passive  shoulder ROM   PATIENT EDUCATION: Education details: POC Person educated: Patient Education method: Explanation Education comprehension: verbalized understanding  HOME EXERCISE PROGRAM: Doorway stretch   ASSESSMENT:  CLINICAL IMPRESSION: Started gym exercises, started some PROM, she is very painful with PROM but does guard she was able to relax once we got moving,  OBJECTIVE IMPAIRMENTS: decreased balance, decreased endurance, decreased ROM, decreased strength, increased muscle spasms, impaired flexibility, impaired UE functional use, postural dysfunction, and pain.   REHAB POTENTIAL: Good  CLINICAL DECISION MAKING: Stable/uncomplicated  EVALUATION COMPLEXITY: Low   GOALS: Goals reviewed with patient? Yes  SHORT TERM GOALS: Target date: 08/31/22  Independent with initial HEP Goal status: INITIAL  LONG TERM GOALS: Target date: 11/09/22  Independent with advanced HEP Goal status: INITIAL  2.  Decrease pain overall 50% Goal status: INITIAL  3.  Report no difficulty doing hair Goal status: INITIAL  4.  Report able to cook Thanksgiving dinner without diffiucty Goal status: INITIAL  5.  Increase AROM of the shoulders by 20 degrees for all motions Goal status: INITIAL  PLAN:  PT FREQUENCY: 1-2x/week  PT DURATION: 12 weeks  PLANNED INTERVENTIONS: Therapeutic exercises, Therapeutic activity, Neuromuscular re-education, Balance training, Gait training, Patient/Family education, Self Care, Joint mobilization, Joint manipulation, Dry Needling, Electrical stimulation, Cryotherapy, Moist heat, Taping, Vasopneumatic device, and Manual therapy  PLAN FOR  NEXT SESSION: PROM, start some gym, could treat pain as needed, look at pectoral tightness   Ramond Darnell W, PT 08/28/2022, 2:02 PM

## 2022-08-28 NOTE — Patient Instructions (Signed)

## 2022-08-30 ENCOUNTER — Ambulatory Visit: Payer: Medicare Other | Admitting: Physical Therapy

## 2022-08-30 ENCOUNTER — Encounter: Payer: Self-pay | Admitting: Physical Therapy

## 2022-08-30 DIAGNOSIS — M25511 Pain in right shoulder: Secondary | ICD-10-CM | POA: Diagnosis not present

## 2022-08-30 DIAGNOSIS — R296 Repeated falls: Secondary | ICD-10-CM | POA: Diagnosis not present

## 2022-08-30 DIAGNOSIS — M25512 Pain in left shoulder: Secondary | ICD-10-CM | POA: Diagnosis not present

## 2022-08-30 DIAGNOSIS — R262 Difficulty in walking, not elsewhere classified: Secondary | ICD-10-CM | POA: Diagnosis not present

## 2022-08-30 DIAGNOSIS — M6281 Muscle weakness (generalized): Secondary | ICD-10-CM | POA: Diagnosis not present

## 2022-08-30 NOTE — Therapy (Signed)
OUTPATIENT PHYSICAL THERAPY SHOULDER TREATMENT   Patient Name: Kim Grant MRN: [id number] DOB:August 08, 1941, 81 y.o., female Today's Date: 08/30/2022   PT End of Session - 08/30/22 1447     Visit Number 3    Date for PT Re-Evaluation 11/17/22    Authorization Type [employer]BS medicare    PT Start Time 1445    PT Stop Time 1530    PT Time Calculation (min) 45 min    Activity Tolerance Patient tolerated treatment well    Behavior During Therapy [facility] for tasks assessed/performed             Past Medical History:  Diagnosis Date   Anterior tibialis tendinitis 04/18/2016   Arthritis of knee 10/19/2020   Formatting of this note might be different from the original. Added automatically from request for surgery [id number]   Asymptomatic postprocedural ovarian failure 08/04/2014   Bilateral impacted cerumen 05/13/2020   Last Assessment & Plan:  Formatting of this note might be different from the original. HPI:  Complains of stopped up Bilateral ear(s). EXAM: shows cerumen impaction. PLAN: Cerumen removed with various instruments (curettes, suction) giving subjective relief.  External canals and tympanic membranes are otherwise normal.  Return as needed.   Chronic bronchitis ([facility]) 08/04/2014   Closed fracture of nasal bone with routine healing 11/02/2021   Last Assessment & Plan:  Formatting of this note might be different from the original. Nasal fracture. Fell with facial trauma about 3 weeks ago.  CT of the face is reviewed and shows minimally displaced nasal fracture.  Denies any nasal obstruction. EXAM shows mild deviation of the nasal dorsum.  Subtle.  Intranasal exam is unremarkable. PLAN: Reassured I think everything is going to be okay with   Depression    DJD (degenerative joint disease), ankle and foot 01/10/2016   Fall 01/20/2017   Fatigue 02/04/2018   Fracture of surgical neck of right humerus 01/20/2017   Hyperlipemia    Mammographic microcalcification found on diagnostic imaging of breast  08/04/2014   Osteoporosis    Tear of left rotator cuff 07/26/2018   Formatting of this note might be different from the original. 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   Barrett's esophagus without dysplasia 11/25/2018   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[facility]) 05/25/2017   Primary osteoarthritis of left foot 12/02/2015   Age-related osteoporosis without current pathological fracture 08/04/2014   Mixed hyperlipidemia 08/04/2014   Aortic stenosis 08/04/2014   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  She is still hurting in the shoulders and reports decreased ROM  PERTINENT HISTORY: Right shoulder humeral fracture and left shoulder RC repair  PAIN:  Are you having pain? Yes: NPRS scale: 6/10 Pain location: bilateral shoulders  Pain description: sharp, ache Aggravating factors: reaching up, out pain up to 8/10 Relieving factors: rest, OTC pain meds pain at best a 3/10  PRECAUTIONS: None  WEIGHT BEARING RESTRICTIONS: No  FALLS:  Has patient  fallen in last 6 months? No  LIVING ENVIRONMENT: Lives with: lives with their family Lives in: House/apartment Stairs: No Has following equipment at home: None  OCCUPATION: retired  PLOF: Independent and gym 2x/week reports not much walking due to "very poor stamina"  PATIENT GOALS:less pain, stronger, take less pain meds, do hair, reach easier  OBJECTIVE:   DIAGNOSTIC FINDINGS:  None recently  PATIENT SURVEYS:  FOTO 36  COGNITION: Overall cognitive status: Within functional limits for tasks assessed     SENSATION: WFL  POSTURE: Fwd head, rounded shoulders  UPPER EXTREMITY ROM:   Active ROM Right eval Left eval  Shoulder flexion 80 100  Shoulder extension    Shoulder abduction 70 60  Shoulder adduction    Shoulder internal rotation 30 10  Shoulder external rotation 40 50  Elbow flexion    Elbow extension    Wrist flexion    Wrist extension    Wrist ulnar deviation    Wrist radial deviation    Wrist pronation    Wrist supination    (Blank rows = not tested)  UPPER EXTREMITY MMT:  MMT Right eval Left eval  Shoulder flexion 3- 3-  Shoulder extension    Shoulder abduction 3- 3-  Shoulder adduction    Shoulder internal rotation 3+ 3+  Shoulder external rotation 3 3  Middle trapezius    Lower trapezius    Elbow flexion 4- 4-  Elbow extension 4- 4-  Wrist flexion    Wrist extension    Wrist ulnar deviation    Wrist radial deviation    Wrist pronation    Wrist supination    Grip strength (lbs)    (Blank rows = not tested)  SHOULDER SPECIAL TESTS: Impingement tests: Neer impingement test: positive    PALPATION:  Very tight in the upper trap, neck, very tender here and in the upper arms and biceps   TODAY'S TREATMENT:                                                                                                                           DATE: 08/30/22 Nustep level 5 x 6 minutes 10# straight arm pulls 20# triceps 2x10 5# biceps Red tband  ER 2x10 Yellow tband horizontal abduction pain could not do 5 Yellow tband IR Fitter side to side and fwd/bkwd 1 blue band STM to the upper arms and to the shoulders in sitting   08/28/22 Nustep level 5 x 6  minutes 10# straight arm pulls Red tband ER Red tband horizontal abduction caused pain Doorway stretch Red tband rows and extension Lats 15# 2x10 Wand exercises withj some overpressure Supine passive ROM right shoulder STM to the right upper arm very tender and sore and tight in the pectoral   08/18/23 Passive shoulder ROM   PATIENT EDUCATION: Education details: POC Person educated: Patient Education method: Explanation Education comprehension: verbalized understanding  HOME EXERCISE PROGRAM: Doorway stretch   ASSESSMENT:  CLINICAL IMPRESSION: Patient doing well she has some significant knots that are very tender in the lateral upper arms.  Working to get this worked out, she has weakness and difficulty with abduction and lifting arms away from the body  OBJECTIVE IMPAIRMENTS: decreased balance, decreased endurance, decreased ROM, decreased strength, increased muscle spasms, impaired flexibility, impaired UE functional use, postural dysfunction, and pain.   REHAB POTENTIAL: Good  CLINICAL DECISION MAKING: Stable/uncomplicated  EVALUATION COMPLEXITY: Low   GOALS: Goals reviewed with patient? Yes  SHORT TERM GOALS: Target date: 08/31/22  Independent with initial HEP Goal status: progressing  LONG TERM GOALS: Target date: 11/09/22  Independent with advanced HEP Goal status: INITIAL  2.  Decrease pain overall 50% Goal status: INITIAL  3.  Report no difficulty doing hair Goal status: INITIAL  4.  Report able to cook Thanksgiving dinner without diffiucty Goal status: INITIAL  5.  Increase AROM of the shoulders by 20 degrees for all motions Goal status: INITIAL  PLAN:  PT FREQUENCY: 1-2x/week  PT DURATION: 12 weeks  PLANNED INTERVENTIONS:  Therapeutic exercises, Therapeutic activity, Neuromuscular re-education, Balance training, Gait training, Patient/Family education, Self Care, Joint mobilization, Joint manipulation, Dry Needling, Electrical stimulation, Cryotherapy, Moist heat, Taping, Vasopneumatic device, and Manual therapy  PLAN FOR NEXT SESSION: PROM, start some gym, could treat pain as needed, look at pectoral tightness   Jaquavius Hudler W, PT 08/30/2022, 2:47 PM

## 2022-09-01 ENCOUNTER — Ambulatory Visit: Payer: Medicare Other | Admitting: Family Medicine

## 2022-09-04 ENCOUNTER — Encounter: Payer: Self-pay | Admitting: Physical Therapy

## 2022-09-04 ENCOUNTER — Ambulatory Visit: Payer: Medicare Other | Admitting: Physical Therapy

## 2022-09-04 DIAGNOSIS — M25512 Pain in left shoulder: Secondary | ICD-10-CM

## 2022-09-04 DIAGNOSIS — M6281 Muscle weakness (generalized): Secondary | ICD-10-CM | POA: Diagnosis not present

## 2022-09-04 DIAGNOSIS — M25511 Pain in right shoulder: Secondary | ICD-10-CM

## 2022-09-04 DIAGNOSIS — R262 Difficulty in walking, not elsewhere classified: Secondary | ICD-10-CM | POA: Diagnosis not present

## 2022-09-04 DIAGNOSIS — R296 Repeated falls: Secondary | ICD-10-CM | POA: Diagnosis not present

## 2022-09-04 NOTE — Therapy (Signed)
OUTPATIENT PHYSICAL THERAPY SHOULDER TREATMENT   Patient Name: Kim Grant MRN: [id number] DOB:07-Jul-1941, 81 y.o., female Today's Date: 09/04/2022   PT End of Session - 09/04/22 1446     Visit Number 4    Date for PT Re-Evaluation 11/17/22    Authorization Type [employer]BS medicare    PT Start Time 1445    PT Stop Time 1530    PT Time Calculation (min) 45 min    Activity Tolerance Patient tolerated treatment well    Behavior During Therapy [facility] for tasks assessed/performed             Past Medical History:  Diagnosis Date   Anterior tibialis tendinitis 04/18/2016   Arthritis of knee 10/19/2020   Formatting of this note might be different from the original. Added automatically from request for surgery [id number]   Asymptomatic postprocedural ovarian failure 08/04/2014   Bilateral impacted cerumen 05/13/2020   Last Assessment & Plan:  Formatting of this note might be different from the original. HPI:  Complains of stopped up Bilateral ear(s). EXAM: shows cerumen impaction. PLAN: Cerumen removed with various instruments (curettes, suction) giving subjective relief.  External canals and tympanic membranes are otherwise normal.  Return as needed.   Chronic bronchitis ([facility]) 08/04/2014   Closed fracture of nasal bone with routine healing 11/02/2021   Last Assessment & Plan:  Formatting of this note might be different from the original. Nasal fracture. Fell with facial trauma about 3 weeks ago.  CT of the face is reviewed and shows minimally displaced nasal fracture.  Denies any nasal obstruction. EXAM shows mild deviation of the nasal dorsum.  Subtle.  Intranasal exam is unremarkable. PLAN: Reassured I think everything is going to be okay with   Depression    DJD (degenerative joint disease), ankle and foot 01/10/2016   Fall 01/20/2017   Fatigue 02/04/2018   Fracture of surgical neck of right humerus 01/20/2017   Hyperlipemia    Mammographic microcalcification found on diagnostic imaging of breast  08/04/2014   Osteoporosis    Tear of left rotator cuff 07/26/2018   Formatting of this note might be different from the original. 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   Barrett's esophagus without dysplasia 11/25/2018   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[facility]) 05/25/2017   Primary osteoarthritis of left foot 12/02/2015   Age-related osteoporosis without current pathological fracture 08/04/2014   Mixed hyperlipidemia 08/04/2014   Aortic stenosis 08/04/2014   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/2014   Primary osteoarthritis of right knee 06/08/2014    PCP: Fanny Bien  REFERRING PROVIDER: Kristine Royal DIAG: bilateral shoulder pain  THERAPY DIAG:  Acute pain of left shoulder  Acute pain of right shoulder  Difficulty in walking, not elsewhere classified  Muscle weakness (generalized)  Rationale for Evaluation and Treatment Rehabilitation  ONSET DATE: September 2023  SUBJECTIVE:  SUBJECTIVE STATEMENT: Patient reports that her upper arms and shoulders are really hurting today  PERTINENT HISTORY: Right shoulder humeral fracture and left shoulder RC repair  PAIN:  Are you having pain? Yes: NPRS scale: 7/10 Pain location: bilateral shoulders  Pain description: sharp, ache Aggravating factors: reaching up, out pain up to 8/10 Relieving factors: rest, OTC pain meds pain at best a 3/10  PRECAUTIONS: None  WEIGHT BEARING RESTRICTIONS: No  FALLS:  Has patient fallen  in last 6 months? No  LIVING ENVIRONMENT: Lives with: lives with their family Lives in: House/apartment Stairs: No Has following equipment at home: None  OCCUPATION: retired  PLOF: Independent and gym 2x/week reports not much walking due to "very poor stamina"  PATIENT GOALS:less pain, stronger, take less pain meds, do hair, reach easier  OBJECTIVE:   DIAGNOSTIC FINDINGS:  None recently  PATIENT SURVEYS:  FOTO 36  COGNITION: Overall cognitive status: Within functional limits for tasks assessed     SENSATION: WFL  POSTURE: Fwd head, rounded shoulders  UPPER EXTREMITY ROM:   Active ROM Right eval Left eval  Shoulder flexion 80 100  Shoulder extension    Shoulder abduction 70 60  Shoulder adduction    Shoulder internal rotation 30 10  Shoulder external rotation 40 50  Elbow flexion    Elbow extension    Wrist flexion    Wrist extension    Wrist ulnar deviation    Wrist radial deviation    Wrist pronation    Wrist supination    (Blank rows = not tested)  UPPER EXTREMITY MMT:  MMT Right eval Left eval  Shoulder flexion 3- 3-  Shoulder extension    Shoulder abduction 3- 3-  Shoulder adduction    Shoulder internal rotation 3+ 3+  Shoulder external rotation 3 3  Middle trapezius    Lower trapezius    Elbow flexion 4- 4-  Elbow extension 4- 4-  Wrist flexion    Wrist extension    Wrist ulnar deviation    Wrist radial deviation    Wrist pronation    Wrist supination    Grip strength (lbs)    (Blank rows = not tested)  SHOULDER SPECIAL TESTS: Impingement tests: Neer impingement test: positive    PALPATION:  Very tight in the upper trap, neck, very tender here and in the upper arms and biceps   TODAY'S TREATMENT:                                                                                                                           DATE: 09/04/22 UBE level 3 x 5 minutes Wall slides and circles, tends to do a lot of compensation and has a lot  of popping in the left shoulder Seated row 15# 5# biceps 20# triceps Fitter 1 blue band side to side and push/pull Education on relaxing and better mechanics for flexion STM to the lateral upper arms  08/30/22 Nustep level 5 x 6 minutes 10#  straight arm pulls 20# triceps 2x10 5# biceps Red tband ER 2x10 Yellow tband horizontal abduction pain could not do 5 Yellow tband IR Fitter side to side and fwd/bkwd 1 blue band STM to the upper arms and to the shoulders in sitting   08/28/22 Nustep level 5 x 6 minutes 10# straight arm pulls Red tband ER Red tband horizontal abduction caused pain Doorway stretch Red tband rows and extension Lats 15# 2x10 Wand exercises withj some overpressure Supine passive ROM right shoulder STM to the right upper arm very tender and sore and tight in the pectoral   08/18/23 Passive shoulder ROM   PATIENT EDUCATION: Education details: POC Person educated: Patient Education method: Explanation Education comprehension: verbalized understanding  HOME EXERCISE PROGRAM: Doorway stretch   ASSESSMENT:  CLINICAL IMPRESSION: Patient reports hurting today, reports that she has been busy, I am noticing more that she does not relax and tends to hold the shoulder in elevation, she also does not do flexion, when I asked her to keep elbows in she had no pain and good ROM, but she tends to be more in scaption and leads with the elbow out away and this causes pain and popping in the left shoulder  OBJECTIVE IMPAIRMENTS: decreased balance, decreased endurance, decreased ROM, decreased strength, increased muscle spasms, impaired flexibility, impaired UE functional use, postural dysfunction, and pain.   REHAB POTENTIAL: Good  CLINICAL DECISION MAKING: Stable/uncomplicated  EVALUATION COMPLEXITY: Low   GOALS: Goals reviewed with patient? Yes  SHORT TERM GOALS: Target date: 08/31/22  Independent with initial HEP Goal status: progressing  LONG TERM  GOALS: Target date: 11/09/22  Independent with advanced HEP Goal status: INITIAL  2.  Decrease pain overall 50% Goal status: INITIAL  3.  Report no difficulty doing hair Goal status: INITIAL  4.  Report able to cook Thanksgiving dinner without diffiucty Goal status: INITIAL  5.  Increase AROM of the shoulders by 20 degrees for all motions Goal status: INITIAL  PLAN:  PT FREQUENCY: 1-2x/week  PT DURATION: 12 weeks  PLANNED INTERVENTIONS: Therapeutic exercises, Therapeutic activity, Neuromuscular re-education, Balance training, Gait training, Patient/Family education, Self Care, Joint mobilization, Joint manipulation, Dry Needling, Electrical stimulation, Cryotherapy, Moist heat, Taping, Vasopneumatic device, and Manual therapy  PLAN FOR NEXT SESSION: PROM, start some gym, could treat pain as needed, look at pectoral tightness   Manning Luna W, PT 09/04/2022, 2:49 PM

## 2022-09-07 ENCOUNTER — Ambulatory Visit: Payer: Medicare Other | Admitting: Physical Therapy

## 2022-09-18 ENCOUNTER — Encounter: Payer: Self-pay | Admitting: Physical Therapy

## 2022-09-18 ENCOUNTER — Ambulatory Visit: Payer: Medicare Other | Admitting: Physical Therapy

## 2022-09-18 DIAGNOSIS — M25511 Pain in right shoulder: Secondary | ICD-10-CM | POA: Diagnosis not present

## 2022-09-18 DIAGNOSIS — R296 Repeated falls: Secondary | ICD-10-CM

## 2022-09-18 DIAGNOSIS — R262 Difficulty in walking, not elsewhere classified: Secondary | ICD-10-CM | POA: Diagnosis not present

## 2022-09-18 DIAGNOSIS — M25512 Pain in left shoulder: Secondary | ICD-10-CM | POA: Diagnosis not present

## 2022-09-18 DIAGNOSIS — M6281 Muscle weakness (generalized): Secondary | ICD-10-CM | POA: Diagnosis not present

## 2022-09-18 NOTE — Therapy (Signed)
OUTPATIENT PHYSICAL THERAPY SHOULDER TREATMENT   Patient Name: Kim Grant MRN: [id number] DOB:01-05-1941, 81 y.o., female Today's Date: 09/18/2022   PT End of Session - 09/18/22 1102     Visit Number 5    Date for PT Re-Evaluation 11/17/22    Authorization Type BCBS medicare    PT Start Time 1100    PT Stop Time 1145    PT Time Calculation (min) 45 min    Activity Tolerance Patient tolerated treatment well    Behavior During Therapy [facility] for tasks assessed/performed             Past Medical History:  Diagnosis Date   Anterior tibialis tendinitis 04/18/2016   Arthritis of knee 10/19/2020   Formatting of this note might be different from the original. Added automatically from request for surgery [id number]   Asymptomatic postprocedural ovarian failure 08/04/2014   Bilateral impacted cerumen 05/13/2020   Last Assessment & Plan:  Formatting of this note might be different from the original. HPI:  Complains of stopped up Bilateral ear(s). EXAM: shows cerumen impaction. PLAN: Cerumen removed with various instruments (curettes, suction) giving subjective relief.  External canals and tympanic membranes are otherwise normal.  Return as needed.   Chronic bronchitis ([place]) 08/04/2014   Closed fracture of nasal bone with routine healing 11/02/2021   Last Assessment & Plan:  Formatting of this note might be different from the original. Nasal fracture. Fell with facial trauma about 3 weeks ago.  CT of the face is reviewed and shows minimally displaced nasal fracture.  Denies any nasal obstruction. EXAM shows mild deviation of the nasal dorsum.  Subtle.  Intranasal exam is unremarkable. PLAN: Reassured I think everything is going to be okay with   Depression    DJD (degenerative joint disease), ankle and foot 01/10/2016   Fall 01/20/2017   Fatigue 02/04/2018   Fracture of surgical neck of right humerus 01/20/2017   Hyperlipemia    Mammographic microcalcification found on diagnostic imaging of breast  08/04/2014   Osteoporosis    Tear of left rotator cuff 07/26/2018   Formatting of this note might be different from the original. 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   Barrett's esophagus without dysplasia 11/25/2018   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[place]) 05/25/2017   Primary osteoarthritis of left foot 12/02/2015   Age-related osteoporosis without current pathological fracture 08/04/2014   Mixed hyperlipidemia 08/04/2014   Aortic stenosis 08/04/2014   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/2014   Primary osteoarthritis of right knee 06/08/2014    PCP: Josephine Igo  REFERRING PROVIDER: Leslye Peer DIAG: bilateral shoulder pain  THERAPY DIAG:  Acute pain of left shoulder  Acute pain of right shoulder  Difficulty in walking, not elsewhere classified  Muscle weakness (generalized)  Repeated falls  Rationale for Evaluation and Treatment Rehabilitation  ONSET DATE: September 2023  SUBJECTIVE:  SUBJECTIVE STATEMENT: Patient reports that her upper arms and shoulders are really hurting today, she reports that she did two rounds of cooking and cleaning for Thanksgiving  PERTINENT HISTORY: Right shoulder humeral fracture and left shoulder RC repair  PAIN:  Are you having pain? Yes: NPRS scale: 8/10 Pain location: bilateral shoulders  Pain description: sharp, ache Aggravating factors: reaching up, out pain up to 8/10 Relieving factors: rest, OTC pain meds pain at  best a 3/10  PRECAUTIONS: None  WEIGHT BEARING RESTRICTIONS: No  FALLS:  Has patient fallen in last 6 months? No  LIVING ENVIRONMENT: Lives with: lives with their family Lives in: House/apartment Stairs: No Has following equipment at home: None  OCCUPATION: retired  PLOF: Independent and gym 2x/week reports not much walking due to "very poor stamina"  PATIENT GOALS:less pain, stronger, take less pain meds, do hair, reach easier  OBJECTIVE:   DIAGNOSTIC FINDINGS:  None recently  PATIENT SURVEYS:  FOTO 36  COGNITION: Overall cognitive status: Within functional limits for tasks assessed     SENSATION: WFL  POSTURE: Fwd head, rounded shoulders  UPPER EXTREMITY ROM:   Active ROM Right eval Left eval  Shoulder flexion 80 100  Shoulder extension    Shoulder abduction 70 60  Shoulder adduction    Shoulder internal rotation 30 10  Shoulder external rotation 40 50  Elbow flexion    Elbow extension    Wrist flexion    Wrist extension    Wrist ulnar deviation    Wrist radial deviation    Wrist pronation    Wrist supination    (Blank rows = not tested)  UPPER EXTREMITY MMT:  MMT Right eval Left eval  Shoulder flexion 3- 3-  Shoulder extension    Shoulder abduction 3- 3-  Shoulder adduction    Shoulder internal rotation 3+ 3+  Shoulder external rotation 3 3  Middle trapezius    Lower trapezius    Elbow flexion 4- 4-  Elbow extension 4- 4-  Wrist flexion    Wrist extension    Wrist ulnar deviation    Wrist radial deviation    Wrist pronation    Wrist supination    Grip strength (lbs)    (Blank rows = not tested)  SHOULDER SPECIAL TESTS: Impingement tests: Neer impingement test: positive    PALPATION:  Very tight in the upper trap, neck, very tender here and in the upper arms and biceps   TODAY'S TREATMENT:                                                                                                                            DATE: 09/18/22 Nustep Level 5 x 6 minutes Seated row 15# 2x10 Lats 15# 2x10 Chest press 5# 2x10 Triceps 20# 2x10 Fitter side to side and push/pull with 1 blue band Yellow tband ER/IR Yellow tband at waist small abduction Wall slides and circles Seated 3# bent over row, and 2# extension Could not  do a reverse fly with 1# due to pain   09/04/22 UBE level 3 x 5 minutes Wall slides and circles, tends to do a lot of compensation and has a lot of popping in the left shoulder Seated row 15# 5# biceps 20# triceps Fitter 1 blue band side to side and push/pull Education on relaxing and better mechanics for flexion STM to the lateral upper arms  08/30/22 Nustep level 5 x 6 minutes 10# straight arm pulls 20# triceps 2x10 5# biceps Red tband ER 2x10 Yellow tband horizontal abduction pain could not do 5 Yellow tband IR Fitter side to side and fwd/bkwd 1 blue band STM to the upper arms and to the shoulders in sitting   08/28/22 Nustep level 5 x 6 minutes 10# straight arm pulls Red tband ER Red tband horizontal abduction caused pain Doorway stretch Red tband rows and extension Lats 15# 2x10 Wand exercises withj some overpressure Supine passive ROM right shoulder STM to the right upper arm very tender and sore and tight in the pectoral   08/18/23 Passive shoulder ROM   PATIENT EDUCATION: Education details: POC Person educated: Patient Education method: Explanation Education comprehension: verbalized understanding  HOME EXERCISE PROGRAM: Doorway stretch   ASSESSMENT:  CLINICAL IMPRESSION: Patient reports hurting today, reports that she has been busy, with Thanksgiving.  She is hurting in the shoulder mms and is tender.  Once we got moving she reported less pain.  She did have pain with a bent over reverse fly, it was a sudden sharp pain, she did have some spasms in the posterior shoulder mms  OBJECTIVE IMPAIRMENTS: decreased balance, decreased endurance, decreased ROM,  decreased strength, increased muscle spasms, impaired flexibility, impaired UE functional use, postural dysfunction, and pain.   REHAB POTENTIAL: Good  CLINICAL DECISION MAKING: Stable/uncomplicated  EVALUATION COMPLEXITY: Low   GOALS: Goals reviewed with patient? Yes  SHORT TERM GOALS: Target date: 08/31/22  Independent with initial HEP Goal status: met  LONG TERM GOALS: Target date: 11/09/22  Independent with advanced HEP Goal status: INITIAL  2.  Decrease pain overall 50% Goal status: ongoing  3.  Report no difficulty doing hair Goal status: ongoing  4.  Report able to cook Thanksgiving dinner without diffiucty Goal status: partially met  5.  Increase AROM of the shoulders by 20 degrees for all motions Goal status: INITIAL  PLAN:  PT FREQUENCY: 1-2x/week  PT DURATION: 12 weeks  PLANNED INTERVENTIONS: Therapeutic exercises, Therapeutic activity, Neuromuscular re-education, Balance training, Gait training, Patient/Family education, Self Care, Joint mobilization, Joint manipulation, Dry Needling, Electrical stimulation, Cryotherapy, Moist heat, Taping, Vasopneumatic device, and Manual therapy  PLAN FOR NEXT SESSION: PROM, start some gym, could treat pain as needed, look at pectoral tightness   Kervin Bones W, PT 09/18/2022, 11:03 AM

## 2022-09-20 ENCOUNTER — Encounter: Payer: Self-pay | Admitting: Physical Therapy

## 2022-09-20 ENCOUNTER — Ambulatory Visit: Payer: Medicare Other | Admitting: Physical Therapy

## 2022-09-20 DIAGNOSIS — M6281 Muscle weakness (generalized): Secondary | ICD-10-CM

## 2022-09-20 DIAGNOSIS — M25512 Pain in left shoulder: Secondary | ICD-10-CM | POA: Diagnosis not present

## 2022-09-20 DIAGNOSIS — R296 Repeated falls: Secondary | ICD-10-CM | POA: Diagnosis not present

## 2022-09-20 DIAGNOSIS — M25511 Pain in right shoulder: Secondary | ICD-10-CM

## 2022-09-20 DIAGNOSIS — R262 Difficulty in walking, not elsewhere classified: Secondary | ICD-10-CM

## 2022-09-20 NOTE — Therapy (Signed)
OUTPATIENT PHYSICAL THERAPY SHOULDER TREATMENT   Patient Name: Kim Grant MRN: [id number] DOB:October 16, 1941, 81 y.o., female Today's Date: 09/20/2022   PT End of Session - 09/20/22 1448     Visit Number 6    Date for PT Re-Evaluation 11/17/22    Authorization Type BCBS medicare    PT Start Time 0761    PT Stop Time 1530    PT Time Calculation (min) 45 min    Activity Tolerance Patient tolerated treatment well    Behavior During Therapy [facility] for tasks assessed/performed             Past Medical History:  Diagnosis Date   Anterior tibialis tendinitis 04/18/2016   Arthritis of knee 10/19/2020   Formatting of this note might be different from the original. Added automatically from request for surgery [id number]   Asymptomatic postprocedural ovarian failure 08/04/2014   Bilateral impacted cerumen 05/13/2020   Last Assessment & Plan:  Formatting of this note might be different from the original. HPI:  Complains of stopped up Bilateral ear(s). EXAM: shows cerumen impaction. PLAN: Cerumen removed with various instruments (curettes, suction) giving subjective relief.  External canals and tympanic membranes are otherwise normal.  Return as needed.   Chronic bronchitis ([place]) 08/04/2014   Closed fracture of nasal bone with routine healing 11/02/2021   Last Assessment & Plan:  Formatting of this note might be different from the original. Nasal fracture. Fell with facial trauma about 3 weeks ago.  CT of the face is reviewed and shows minimally displaced nasal fracture.  Denies any nasal obstruction. EXAM shows mild deviation of the nasal dorsum.  Subtle.  Intranasal exam is unremarkable. PLAN: Reassured I think everything is going to be okay with   Depression    DJD (degenerative joint disease), ankle and foot 01/10/2016   Fall 01/20/2017   Fatigue 02/04/2018   Fracture of surgical neck of right humerus 01/20/2017   Hyperlipemia    Mammographic microcalcification found on diagnostic imaging of breast  08/04/2014   Osteoporosis    Tear of left rotator cuff 07/26/2018   Formatting of this note might be different from the original. 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[place]) 05/25/2017   Primary osteoarthritis of left foot 12/02/2015   Age-related osteoporosis without current pathological fracture 08/04/2014   Mixed hyperlipidemia 08/04/2014   Aortic stenosis 08/04/2014   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/2014   Primary osteoarthritis of right knee 06/08/2014    PCP: Josephine Igo  REFERRING PROVIDER: Leslye Peer DIAG: bilateral shoulder pain  THERAPY DIAG:  Acute pain of left shoulder  Acute pain of right shoulder  Difficulty in walking, not elsewhere classified  Muscle weakness (generalized)  Rationale for Evaluation and Treatment Rehabilitation  ONSET DATE: September 2023  SUBJECTIVE:  SUBJECTIVE STATEMENT: Patient reports That she is still hurting reports that hte right upper arm is hurting today and the left is feeling better PERTINENT HISTORY: Right shoulder humeral fracture and left shoulder RC repair  PAIN:  Are you having pain? Yes: NPRS scale: 7/10 Pain location: bilateral shoulders  Pain description: sharp, ache Aggravating factors: reaching up, out pain up to 8/10 Relieving factors: rest, OTC pain meds pain at best a 3/10  PRECAUTIONS: None  WEIGHT BEARING  RESTRICTIONS: No  FALLS:  Has patient fallen in last 6 months? No  LIVING ENVIRONMENT: Lives with: lives with their family Lives in: House/apartment Stairs: No Has following equipment at home: None  OCCUPATION: retired  PLOF: Independent and gym 2x/week reports not much walking due to "very poor stamina"  PATIENT GOALS:less pain, stronger, take less pain meds, do hair, reach easier  OBJECTIVE:   DIAGNOSTIC FINDINGS:  None recently  PATIENT SURVEYS:  FOTO 36  COGNITION: Overall cognitive status: Within functional limits for tasks assessed     SENSATION: WFL  POSTURE: Fwd head, rounded shoulders  UPPER EXTREMITY ROM:   Active ROM Right eval Left eval  Shoulder flexion 80 100  Shoulder extension    Shoulder abduction 70 60  Shoulder adduction    Shoulder internal rotation 30 10  Shoulder external rotation 40 50  Elbow flexion    Elbow extension    Wrist flexion    Wrist extension    Wrist ulnar deviation    Wrist radial deviation    Wrist pronation    Wrist supination    (Blank rows = not tested)  UPPER EXTREMITY MMT:  MMT Right eval Left eval  Shoulder flexion 3- 3-  Shoulder extension    Shoulder abduction 3- 3-  Shoulder adduction    Shoulder internal rotation 3+ 3+  Shoulder external rotation 3 3  Middle trapezius    Lower trapezius    Elbow flexion 4- 4-  Elbow extension 4- 4-  Wrist flexion    Wrist extension    Wrist ulnar deviation    Wrist radial deviation    Wrist pronation    Wrist supination    Grip strength (lbs)    (Blank rows = not tested)  SHOULDER SPECIAL TESTS: Impingement tests: Neer impingement test: positive    PALPATION:  Very tight in the upper trap, neck, very tender here and in the upper arms and biceps   TODAY'S TREATMENT:                                                                                                                           DATE: 09/20/22 UBE level 3.5 x 5 mintues 10# straight arm  pulls 25# triceps 10# biceps Yellow tband ER and IR Small yellow tband abd 2# wand exercises 1 blue band fitter side to side and push/pull Ball vs wall 2 positions AROM right 100 degrees, left 115 degrees STM to the right upper arm  09/18/22 Nustep Level  5 x 6 minutes Seated row 15# 2x10 Lats 15# 2x10 Chest press 5# 2x10 Triceps 20# 2x10 Fitter side to side and push/pull with 1 blue band Yellow tband ER/IR Yellow tband at waist small abduction Wall slides and circles Seated 3# bent over row, and 2# extension Could not do a reverse fly with 1# due to pain   09/04/22 UBE level 3 x 5 minutes Wall slides and circles, tends to do a lot of compensation and has a lot of popping in the left shoulder Seated row 15# 5# biceps 20# triceps Fitter 1 blue band side to side and push/pull Education on relaxing and better mechanics for flexion STM to the lateral upper arms  08/30/22 Nustep level 5 x 6 minutes 10# straight arm pulls 20# triceps 2x10 5# biceps Red tband ER 2x10 Yellow tband horizontal abduction pain could not do 5 Yellow tband IR Fitter side to side and fwd/bkwd 1 blue band STM to the upper arms and to the shoulders in sitting   08/28/22 Nustep level 5 x 6 minutes 10# straight arm pulls Red tband ER Red tband horizontal abduction caused pain Doorway stretch Red tband rows and extension Lats 15# 2x10 Wand exercises withj some overpressure Supine passive ROM right shoulder STM to the right upper arm very tender and sore and tight in the pectoral   08/18/23 Passive shoulder ROM   PATIENT EDUCATION: Education details: POC Person educated: Patient Education method: Explanation Education comprehension: verbalized understanding  HOME EXERCISE PROGRAM: Doorway stretch   ASSESSMENT:  CLINICAL IMPRESSION: Patient reports feeling better today with the left arm, she had a much improved AROM measurement from the evaluation, still limited and with pain, she  had more pain in the right arm today.  Still very tender to the right lateral arm  OBJECTIVE IMPAIRMENTS: decreased balance, decreased endurance, decreased ROM, decreased strength, increased muscle spasms, impaired flexibility, impaired UE functional use, postural dysfunction, and pain.   REHAB POTENTIAL: Good  CLINICAL DECISION MAKING: Stable/uncomplicated  EVALUATION COMPLEXITY: Low   GOALS: Goals reviewed with patient? Yes  SHORT TERM GOALS: Target date: 08/31/22  Independent with initial HEP Goal status: met  LONG TERM GOALS: Target date: 11/09/22  Independent with advanced HEP Goal status: INITIAL  2.  Decrease pain overall 50% Goal status: ongoing  3.  Report no difficulty doing hair Goal status: ongoing  4.  Report able to cook Thanksgiving dinner without diffiucty Goal status: partially met  5.  Increase AROM of the shoulders by 20 degrees for all motions Goal status: progressing  PLAN:  PT FREQUENCY: 1-2x/week  PT DURATION: 12 weeks  PLANNED INTERVENTIONS: Therapeutic exercises, Therapeutic activity, Neuromuscular re-education, Balance training, Gait training, Patient/Family education, Self Care, Joint mobilization, Joint manipulation, Dry Needling, Electrical stimulation, Cryotherapy, Moist heat, Taping, Vasopneumatic device, and Manual therapy  PLAN FOR NEXT SESSION: see if we can continue to get increase ROM and strength to increase overall function  Cristalle Rohm W, PT 09/20/2022, 2:48 PM

## 2022-09-22 ENCOUNTER — Inpatient Hospital Stay (HOSPITAL_BASED_OUTPATIENT_CLINIC_OR_DEPARTMENT_OTHER): Payer: Medicare Other | Admitting: Family

## 2022-09-22 ENCOUNTER — Encounter: Payer: Self-pay | Admitting: Family

## 2022-09-22 ENCOUNTER — Inpatient Hospital Stay: Payer: Medicare Other | Attending: Hematology & Oncology

## 2022-09-22 ENCOUNTER — Inpatient Hospital Stay: Payer: Medicare Other

## 2022-09-22 VITALS — BP 134/61 | HR 65 | Temp 97.7°F | Resp 20 | Ht 64.0 in | Wt 169.0 lb

## 2022-09-22 DIAGNOSIS — D631 Anemia in chronic kidney disease: Secondary | ICD-10-CM | POA: Diagnosis not present

## 2022-09-22 DIAGNOSIS — D509 Iron deficiency anemia, unspecified: Secondary | ICD-10-CM

## 2022-09-22 DIAGNOSIS — N189 Chronic kidney disease, unspecified: Secondary | ICD-10-CM | POA: Diagnosis not present

## 2022-09-22 LAB — CMP (CANCER CENTER ONLY)
ALT: 19 U/L (ref 0–44)
AST: 24 U/L (ref 15–41)
Albumin: 4.7 g/dL (ref 3.5–5.0)
Alkaline Phosphatase: 51 U/L (ref 38–126)
Anion gap: 10 (ref 5–15)
BUN: 25 mg/dL — ABNORMAL HIGH (ref 8–23)
CO2: 27 mmol/L (ref 22–32)
Calcium: 10.4 mg/dL — ABNORMAL HIGH (ref 8.9–10.3)
Chloride: 97 mmol/L — ABNORMAL LOW (ref 98–111)
Creatinine: 1.13 mg/dL — ABNORMAL HIGH (ref 0.44–1.00)
GFR, Estimated: 49 mL/min — ABNORMAL LOW (ref >60)
Glucose, Bld: 95 mg/dL (ref 70–99)
Potassium: 4.5 mmol/L (ref 3.5–5.1)
Sodium: 134 mmol/L — ABNORMAL LOW (ref 135–145)
Total Bilirubin: 0.4 mg/dL (ref 0.3–1.2)
Total Protein: 7.6 g/dL (ref 6.5–8.1)

## 2022-09-22 LAB — CBC WITH DIFFERENTIAL (CANCER CENTER ONLY)
Abs Immature Granulocytes: 0.03 10*3/uL (ref 0.00–0.07)
Basophils Absolute: 0.1 10*3/uL (ref 0.0–0.1)
Basophils Relative: 1 %
Eosinophils Absolute: 0.1 10*3/uL (ref 0.0–0.5)
Eosinophils Relative: 2 %
HCT: 34.7 % — ABNORMAL LOW (ref 36.0–46.0)
Hemoglobin: 11.1 g/dL — ABNORMAL LOW (ref 12.0–15.0)
Immature Granulocytes: 1 %
Lymphocytes Relative: 23 %
Lymphs Abs: 1.4 10*3/uL (ref 0.7–4.0)
MCH: 27.5 pg (ref 26.0–34.0)
MCHC: 32 g/dL (ref 30.0–36.0)
MCV: 85.9 fL (ref 80.0–100.0)
Monocytes Absolute: 0.4 10*3/uL (ref 0.1–1.0)
Monocytes Relative: 7 %
Neutro Abs: 4 10*3/uL (ref 1.7–7.7)
Neutrophils Relative %: 66 %
Platelet Count: 291 10*3/uL (ref 150–400)
RBC: 4.04 MIL/uL (ref 3.87–5.11)
RDW: 13.9 % (ref 11.5–15.5)
WBC Count: 6.1 10*3/uL (ref 4.0–10.5)
nRBC: 0 % (ref 0.0–0.2)

## 2022-09-22 LAB — RETICULOCYTES
Immature Retic Fract: 1.9 % — ABNORMAL LOW (ref 2.3–15.9)
RBC.: 4.06 MIL/uL (ref 3.87–5.11)
Retic Count, Absolute: 28.4 10*3/uL (ref 19.0–186.0)
Retic Ct Pct: 0.7 % (ref 0.4–3.1)

## 2022-09-22 LAB — FERRITIN: Ferritin: 62 ng/mL (ref 11–307)

## 2022-09-22 NOTE — Progress Notes (Signed)
Hematology and Oncology Follow Up Visit  Kim Grant [id number] 1941-06-17 81 y.o. 09/22/2022   Principle Diagnosis:  Erythropoietin deficiency anemia    Current Therapy:        Retacrit 40,000 units SQ for Hgb < 11   Interim History:  Kim Grant is here today for follow-up. Hgb is 11.1, no injection needed at this time.  She is doing well and staying busy. She has been working out 2 days a week on her own and also doing PT for her right shoulder 2 days a week.  No blood loss, bruising or petechiae noted.  No fever, chills, n/v, cough, rash, dizziness, SOB, chest pain, palpitations, abdominal pain or changes in bowel or bladder habits at this time.  She states that she has occasional chest discomfort secondary to GERD and hiatal hernia.  No swelling, numbness or tingling in her extremities at this time.  No falls or syncope reported.  Appetite and hydration are good. Weight is stable at 169 lbs.   ECOG Performance Status: 1 - Symptomatic but completely ambulatory  Medications:  Allergies as of 09/22/2022   No Known Allergies      Medication List        Accurate as of September 22, 2022  3:07 PM. If you have any questions, ask your nurse or doctor.          STOP taking these medications    ibuprofen 200 MG tablet Commonly known as: ADVIL Stopped by: Eileen Stanford, NP       TAKE these medications    calcium citrate-vitamin D 315-200 MG-UNIT tablet Commonly known as: CITRACAL+D Take 3 tablets by mouth daily.   citalopram 40 MG tablet Commonly known as: CELEXA Take 1 tablet (40 mg total) by mouth daily.   denosumab 60 MG/ML Sosy injection Commonly known as: PROLIA Inject 60 mg into the skin every 6 (six) months.   meclizine 25 MG tablet Commonly known as: ANTIVERT Take 1 tablet (25 mg total) by mouth 3 (three) times daily as needed for dizziness.   melatonin 5 MG Tabs Take 5 mg by mouth at bedtime as needed.   multivitamin with minerals Tabs tablet Take 1  tablet by mouth daily.   pantoprazole 40 MG tablet Commonly known as: PROTONIX Take 40 mg by mouth 2 (two) times daily.   pravastatin 40 MG tablet Commonly known as: PRAVACHOL Take 1 tablet (40 mg total) by mouth daily.        Allergies: No Known Allergies  Past Medical History, Surgical history, Social history, and Family History were reviewed and updated.  Review of Systems: All other 10 point review of systems is negative.   Physical Exam:  height is 5\' 4"  (1.626 m) and weight is 169 lb (76.7 kg). Her oral temperature is 97.7 F (36.5 C). Her blood pressure is 134/61 and her pulse is 65. Her respiration is 20.   Wt Readings from Last 3 Encounters:  09/22/22 169 lb (76.7 kg)  06/23/22 165 lb (74.8 kg)  03/24/22 162 lb (73.5 kg)    Ocular: Sclerae unicteric, pupils equal, round and reactive to light Ear-nose-throat: Oropharynx clear, dentition fair Lymphatic: No cervical or supraclavicular adenopathy Lungs no rales or rhonchi, good excursion bilaterally Heart regular rate and rhythm, no murmur appreciated Abd soft, nontender, positive bowel sounds MSK no focal spinal tenderness, no joint edema Neuro: non-focal, well-oriented, appropriate affect Breasts: Deferred   Lab Results  Component Value Date   WBC 6.1 09/22/2022   HGB  11.1 (L) 09/22/2022   HCT 34.7 (L) 09/22/2022   MCV 85.9 09/22/2022   PLT 291 09/22/2022   Lab Results  Component Value Date   FERRITIN 74 06/23/2022   IRON 77 06/23/2022   TIBC 354 06/23/2022   UIBC 277 06/23/2022   IRONPCTSAT 22 06/23/2022   Lab Results  Component Value Date   RETICCTPCT 0.7 09/22/2022   RBC 4.06 09/22/2022   No results found for: "KPAFRELGTCHN", "LAMBDASER", "KAPLAMBRATIO" No results found for: "IGGSERUM", "IGA", "IGMSERUM" No results found for: "TOTALPROTELP", "ALBUMINELP", "A1GS", "A2GS", "BETS", "BETA2SER", "GAMS", "MSPIKE", "SPEI"   Chemistry      Component Value Date/Time   NA 134 (L) 09/22/2022 1404    K 4.5 09/22/2022 1404   CL 97 (L) 09/22/2022 1404   CO2 27 09/22/2022 1404   BUN 25 (H) 09/22/2022 1404   CREATININE 1.13 (H) 09/22/2022 1404      Component Value Date/Time   CALCIUM 10.4 (H) 09/22/2022 1404   ALKPHOS 51 09/22/2022 1404   AST 24 09/22/2022 1404   ALT 19 09/22/2022 1404   BILITOT 0.4 09/22/2022 1404       Impression and Plan: Kim Grant is a very pleasant 81 yo caucasian female with erythropoietin deficiency anemia.  ESA given, Hgb 10.7.  Iron studies pending.  Lab check every month, follow-up in 3 months.   Eileen Stanford, NP 12/1/20233:07 PM

## 2022-09-25 ENCOUNTER — Encounter: Payer: Self-pay | Admitting: Physical Therapy

## 2022-09-25 ENCOUNTER — Ambulatory Visit: Payer: Medicare Other | Attending: Sports Medicine | Admitting: Physical Therapy

## 2022-09-25 DIAGNOSIS — M6281 Muscle weakness (generalized): Secondary | ICD-10-CM | POA: Insufficient documentation

## 2022-09-25 DIAGNOSIS — R262 Difficulty in walking, not elsewhere classified: Secondary | ICD-10-CM | POA: Insufficient documentation

## 2022-09-25 DIAGNOSIS — M25511 Pain in right shoulder: Secondary | ICD-10-CM | POA: Insufficient documentation

## 2022-09-25 DIAGNOSIS — M25512 Pain in left shoulder: Secondary | ICD-10-CM | POA: Diagnosis not present

## 2022-09-25 DIAGNOSIS — R296 Repeated falls: Secondary | ICD-10-CM | POA: Insufficient documentation

## 2022-09-25 LAB — IRON AND IRON BINDING CAPACITY (CC-WL,HP ONLY)
Iron: 87 ug/dL (ref 28–170)
Saturation Ratios: 22 % (ref 10.4–31.8)
TIBC: 403 ug/dL (ref 250–450)
UIBC: 316 ug/dL (ref 148–442)

## 2022-09-25 NOTE — Therapy (Signed)
OUTPATIENT PHYSICAL THERAPY SHOULDER TREATMENT   Patient Name: Kim Grant MRN: [id number] DOB:1941/01/27, 81 y.o., female Today's Date: 09/25/2022   PT End of Session - 09/25/22 1108     Visit Number 7    Date for PT Re-Evaluation 11/17/22    Authorization Type [employer] medicare    PT Start Time 1100    PT Stop Time 1145    PT Time Calculation (min) 45 min    Activity Tolerance Patient tolerated treatment well    Behavior During Therapy [facility] for tasks assessed/performed             Past Medical History:  Diagnosis Date   Anterior tibialis tendinitis 04/18/2016   Arthritis of knee 10/19/2020   Formatting of this note might be different from the original. Added automatically from request for surgery [id number]   Asymptomatic postprocedural ovarian failure 08/04/2014   Bilateral impacted cerumen 05/13/2020   Last Assessment & Plan:  Formatting of this note might be different from the original. HPI:  Complains of stopped up Bilateral ear(s). EXAM: shows cerumen impaction. PLAN: Cerumen removed with various instruments (curettes, suction) giving subjective relief.  External canals and tympanic membranes are otherwise normal.  Return as needed.   Chronic bronchitis ([place]) 08/04/2014   Closed fracture of nasal bone with routine healing 11/02/2021   Last Assessment & Plan:  Formatting of this note might be different from the original. Nasal fracture. Fell with facial trauma about 3 weeks ago.  CT of the face is reviewed and shows minimally displaced nasal fracture.  Denies any nasal obstruction. EXAM shows mild deviation of the nasal dorsum.  Subtle.  Intranasal exam is unremarkable. PLAN: Reassured I think everything is going to be okay with   Depression    DJD (degenerative joint disease), ankle and foot 01/10/2016   Fall 01/20/2017   Fatigue 02/04/2018   Fracture of surgical neck of right humerus 01/20/2017   Hyperlipemia    Mammographic microcalcification found on diagnostic imaging of breast  08/04/2014   Osteoporosis    Tear of left rotator cuff 07/26/2018   Formatting of this note might be different from the original. 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[place]) 05/25/2017   Primary osteoarthritis of left foot 12/02/2015   Age-related osteoporosis without current pathological fracture 08/04/2014   Mixed hyperlipidemia 08/04/2014   Aortic stenosis 08/04/2014   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/2014   Primary osteoarthritis of right knee 06/08/2014    PCP: Josephine Igo  REFERRING PROVIDER: Leslye Peer DIAG: bilateral shoulder pain  THERAPY DIAG:  Acute pain of left shoulder  Acute pain of right shoulder  Rationale for Evaluation and Treatment Rehabilitation  ONSET DATE: September 2023  SUBJECTIVE:  SUBJECTIVE STATEMENT: Reports that her shoulders continue to hurt, ache reports that she is taking Tylenol a few times a day to help with the pain PERTINENT HISTORY: Right shoulder humeral fracture and left shoulder RC repair  PAIN:  Are you having pain? Yes: NPRS scale: 7/10 Pain location: bilateral shoulders  Pain description: sharp, ache Aggravating factors: reaching up, out pain up to 8/10 Relieving factors: rest, OTC pain meds pain at best a 3/10  PRECAUTIONS: None  WEIGHT BEARING RESTRICTIONS: No  FALLS:  Has patient fallen in last 6 months? No  LIVING  ENVIRONMENT: Lives with: lives with their family Lives in: House/apartment Stairs: No Has following equipment at home: None  OCCUPATION: retired  PLOF: Independent and gym 2x/week reports not much walking due to "very poor stamina"  PATIENT GOALS:less pain, stronger, take less pain meds, do hair, reach easier  OBJECTIVE:   DIAGNOSTIC FINDINGS:  None recently  PATIENT SURVEYS:  FOTO 36  COGNITION: Overall cognitive status: Within functional limits for tasks assessed     SENSATION: WFL  POSTURE: Fwd head, rounded shoulders  UPPER EXTREMITY ROM:   Active ROM Right eval Left eval  Shoulder flexion 80 100  Shoulder extension    Shoulder abduction 70 60  Shoulder adduction    Shoulder internal rotation 30 10  Shoulder external rotation 40 50  Elbow flexion    Elbow extension    Wrist flexion    Wrist extension    Wrist ulnar deviation    Wrist radial deviation    Wrist pronation    Wrist supination    (Blank rows = not tested)  UPPER EXTREMITY MMT:  MMT Right eval Left eval  Shoulder flexion 3- 3-  Shoulder extension    Shoulder abduction 3- 3-  Shoulder adduction    Shoulder internal rotation 3+ 3+  Shoulder external rotation 3 3  Middle trapezius    Lower trapezius    Elbow flexion 4- 4-  Elbow extension 4- 4-  Wrist flexion    Wrist extension    Wrist ulnar deviation    Wrist radial deviation    Wrist pronation    Wrist supination    Grip strength (lbs)    (Blank rows = not tested)  SHOULDER SPECIAL TESTS: Impingement tests: Neer impingement test: positive    PALPATION:  Very tight in the upper trap, neck, very tender here and in the upper arms and biceps   TODAY'S TREATMENT:                                                                                                                           DATE: 09/25/22 UBE level 1 x 6 minutes Rows 15# Lats 15# Chest press 5# Wall slides Red tband ER and IR Small isometric abduction PROM  and STM to both shoiulders  09/20/22 UBE level 3.5 x 5 mintues 10# straight arm pulls 25# triceps 10# biceps Yellow tband ER and IR Small yellow tband abd 2#  wand exercises 1 blue band fitter side to side and push/pull Ball vs wall 2 positions AROM right 100 degrees, left 115 degrees STM to the right upper arm  09/18/22 Nustep Level 5 x 6 minutes Seated row 15# 2x10 Lats 15# 2x10 Chest press 5# 2x10 Triceps 20# 2x10 Fitter side to side and push/pull with 1 blue band Yellow tband ER/IR Yellow tband at waist small abduction Wall slides and circles Seated 3# bent over row, and 2# extension Could not do a reverse fly with 1# due to pain   09/04/22 UBE level 3 x 5 minutes Wall slides and circles, tends to do a lot of compensation and has a lot of popping in the left shoulder Seated row 15# 5# biceps 20# triceps Fitter 1 blue band side to side and push/pull Education on relaxing and better mechanics for flexion STM to the lateral upper arms  08/30/22 Nustep level 5 x 6 minutes 10# straight arm pulls 20# triceps 2x10 5# biceps Red tband ER 2x10 Yellow tband horizontal abduction pain could not do 5 Yellow tband IR Fitter side to side and fwd/bkwd 1 blue band STM to the upper arms and to the shoulders in sitting   08/28/22 Nustep level 5 x 6 minutes 10# straight arm pulls Red tband ER Red tband horizontal abduction caused pain Doorway stretch Red tband rows and extension Lats 15# 2x10 Wand exercises withj some overpressure Supine passive ROM right shoulder STM to the right upper arm very tender and sore and tight in the pectoral   08/18/23 Passive shoulder ROM   PATIENT EDUCATION: Education details: POC Person educated: Patient Education method: Explanation Education comprehension: verbalized understanding  HOME EXERCISE PROGRAM: Doorway stretch   ASSESSMENT:  CLINICAL IMPRESSION: Patient reports she has been sore in the upper arms and shoulders,  she feels very stiff in the right shoulder.  She is very tender in the right biceps and teres as well as rhomboid area.  OBJECTIVE IMPAIRMENTS: decreased balance, decreased endurance, decreased ROM, decreased strength, increased muscle spasms, impaired flexibility, impaired UE functional use, postural dysfunction, and pain.   REHAB POTENTIAL: Good  CLINICAL DECISION MAKING: Stable/uncomplicated  EVALUATION COMPLEXITY: Low   GOALS: Goals reviewed with patient? Yes  SHORT TERM GOALS: Target date: 08/31/22  Independent with initial HEP Goal status: met  LONG TERM GOALS: Target date: 11/09/22  Independent with advanced HEP Goal status: INITIAL  2.  Decrease pain overall 50% Goal status: ongoing  3.  Report no difficulty doing hair Goal status: ongoing  4.  Report able to cook Thanksgiving dinner without diffiucty Goal status: partially met  5.  Increase AROM of the shoulders by 20 degrees for all motions Goal status: progressing  PLAN:  PT FREQUENCY: 1-2x/week  PT DURATION: 12 weeks  PLANNED INTERVENTIONS: Therapeutic exercises, Therapeutic activity, Neuromuscular re-education, Balance training, Gait training, Patient/Family education, Self Care, Joint mobilization, Joint manipulation, Dry Needling, Electrical stimulation, Cryotherapy, Moist heat, Taping, Vasopneumatic device, and Manual therapy  PLAN FOR NEXT SESSION: may look at more STM to see if this will help her pain  Malary Aylesworth W, PT 09/25/2022, 11:08 AM

## 2022-09-27 ENCOUNTER — Ambulatory Visit: Payer: Medicare Other | Admitting: Physical Therapy

## 2022-09-27 ENCOUNTER — Encounter: Payer: Self-pay | Admitting: Physical Therapy

## 2022-09-27 DIAGNOSIS — M25511 Pain in right shoulder: Secondary | ICD-10-CM | POA: Diagnosis not present

## 2022-09-27 DIAGNOSIS — M6281 Muscle weakness (generalized): Secondary | ICD-10-CM | POA: Diagnosis not present

## 2022-09-27 DIAGNOSIS — M25512 Pain in left shoulder: Secondary | ICD-10-CM

## 2022-09-27 DIAGNOSIS — R296 Repeated falls: Secondary | ICD-10-CM | POA: Diagnosis not present

## 2022-09-27 DIAGNOSIS — R262 Difficulty in walking, not elsewhere classified: Secondary | ICD-10-CM | POA: Diagnosis not present

## 2022-09-27 NOTE — Therapy (Signed)
OUTPATIENT PHYSICAL THERAPY SHOULDER TREATMENT   Patient Name: Kim Grant MRN: [id number] DOB:01-Dec-1940, 81 y.o., female Today's Date: 09/27/2022   PT End of Session - 09/27/22 1703     Visit Number 8    Date for PT Re-Evaluation 11/17/22    Authorization Type [employer] medicare    PT Start Time 1700    PT Stop Time 1745    PT Time Calculation (min) 45 min    Activity Tolerance Patient tolerated treatment well    Behavior During Therapy [facility] for tasks assessed/performed             Past Medical History:  Diagnosis Date   Anterior tibialis tendinitis 04/18/2016   Arthritis of knee 10/19/2020   Formatting of this note might be different from the original. Added automatically from request for surgery [id number]   Asymptomatic postprocedural ovarian failure 08/04/2014   Bilateral impacted cerumen 05/13/2020   Last Assessment & Plan:  Formatting of this note might be different from the original. HPI:  Complains of stopped up Bilateral ear(s). EXAM: shows cerumen impaction. PLAN: Cerumen removed with various instruments (curettes, suction) giving subjective relief.  External canals and tympanic membranes are otherwise normal.  Return as needed.   Chronic bronchitis ([place]) 08/04/2014   Closed fracture of nasal bone with routine healing 11/02/2021   Last Assessment & Plan:  Formatting of this note might be different from the original. Nasal fracture. Fell with facial trauma about 3 weeks ago.  CT of the face is reviewed and shows minimally displaced nasal fracture.  Denies any nasal obstruction. EXAM shows mild deviation of the nasal dorsum.  Subtle.  Intranasal exam is unremarkable. PLAN: Reassured I think everything is going to be okay with   Depression    DJD (degenerative joint disease), ankle and foot 01/10/2016   Fall 01/20/2017   Fatigue 02/04/2018   Fracture of surgical neck of right humerus 01/20/2017   Hyperlipemia    Mammographic microcalcification found on diagnostic imaging of breast  08/04/2014   Osteoporosis    Tear of left rotator cuff 07/26/2018   Formatting of this note might be different from the original. 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[place]) 05/25/2017   Primary osteoarthritis of left foot 12/02/2015   Age-related osteoporosis without current pathological fracture 08/04/2014   Mixed hyperlipidemia 08/04/2014   Aortic stenosis 08/04/2014   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/2014   Primary osteoarthritis of right knee 06/08/2014    PCP: Josephine Igo  REFERRING PROVIDER: Leslye Peer DIAG: bilateral shoulder pain  THERAPY DIAG:  Acute pain of left shoulder  Acute pain of right shoulder  Rationale for Evaluation and Treatment Rehabilitation  ONSET DATE: September 2023  SUBJECTIVE:  SUBJECTIVE STATEMENT: Reports that her shoulders continue to hurt, ache reports that she is taking Tylenol a few times a day to help with the pain PERTINENT HISTORY: Right shoulder humeral fracture and left shoulder RC repair  PAIN:  Are you having pain? Yes: NPRS scale: 7/10 Pain location: bilateral shoulders  Pain description: sharp, ache Aggravating factors: reaching up, out pain up to 8/10 Relieving factors: rest, OTC pain meds pain at best a 3/10  PRECAUTIONS: None  WEIGHT BEARING RESTRICTIONS: No  FALLS:  Has patient fallen in last 6 months? No  LIVING  ENVIRONMENT: Lives with: lives with their family Lives in: House/apartment Stairs: No Has following equipment at home: None  OCCUPATION: retired  PLOF: Independent and gym 2x/week reports not much walking due to "very poor stamina"  PATIENT GOALS:less pain, stronger, take less pain meds, do hair, reach easier  OBJECTIVE:   DIAGNOSTIC FINDINGS:  None recently  PATIENT SURVEYS:  FOTO 36  COGNITION: Overall cognitive status: Within functional limits for tasks assessed     SENSATION: WFL  POSTURE: Fwd head, rounded shoulders  UPPER EXTREMITY ROM:   Active ROM Right eval Left eval  Shoulder flexion 80 100  Shoulder extension    Shoulder abduction 70 60  Shoulder adduction    Shoulder internal rotation 30 10  Shoulder external rotation 40 50  Elbow flexion    Elbow extension    Wrist flexion    Wrist extension    Wrist ulnar deviation    Wrist radial deviation    Wrist pronation    Wrist supination    (Blank rows = not tested)  UPPER EXTREMITY MMT:  MMT Right eval Left eval  Shoulder flexion 3- 3-  Shoulder extension    Shoulder abduction 3- 3-  Shoulder adduction    Shoulder internal rotation 3+ 3+  Shoulder external rotation 3 3  Middle trapezius    Lower trapezius    Elbow flexion 4- 4-  Elbow extension 4- 4-  Wrist flexion    Wrist extension    Wrist ulnar deviation    Wrist radial deviation    Wrist pronation    Wrist supination    Grip strength (lbs)    (Blank rows = not tested)  SHOULDER SPECIAL TESTS: Impingement tests: Neer impingement test: positive    PALPATION:  Very tight in the upper trap, neck, very tender here and in the upper arms and biceps   TODAY'S TREATMENT:                                                                                                                           DATE: 09/27/22 Nustep level 5 x 6 minutes Supine 2# and 3# stick chest press and flexion Supine ER and IR with 2# Supine 3# serratus,  isometric circles 5# straight arm pulls 15# rows Lats 15# Ball vs wall Ball approximation circles STM to the right upper trap and shoulder  09/25/22 UBE level 1 x 6  minutes Rows 15# Lats 15# Chest press 5# Wall slides Red tband ER and IR Small isometric abduction PROM and STM to both shoiulders  09/20/22 UBE level 3.5 x 5 mintues 10# straight arm pulls 25# triceps 10# biceps Yellow tband ER and IR Small yellow tband abd 2# wand exercises 1 blue band fitter side to side and push/pull Ball vs wall 2 positions AROM right 100 degrees, left 115 degrees STM to the right upper arm  09/18/22 Nustep Level 5 x 6 minutes Seated row 15# 2x10 Lats 15# 2x10 Chest press 5# 2x10 Triceps 20# 2x10 Fitter side to side and push/pull with 1 blue band Yellow tband ER/IR Yellow tband at waist small abduction Wall slides and circles Seated 3# bent over row, and 2# extension Could not do a reverse fly with 1# due to pain   09/04/22 UBE level 3 x 5 minutes Wall slides and circles, tends to do a lot of compensation and has a lot of popping in the left shoulder Seated row 15# 5# biceps 20# triceps Fitter 1 blue band side to side and push/pull Education on relaxing and better mechanics for flexion STM to the lateral upper arms  08/30/22 Nustep level 5 x 6 minutes 10# straight arm pulls 20# triceps 2x10 5# biceps Red tband ER 2x10 Yellow tband horizontal abduction pain could not do 5 Yellow tband IR Fitter side to side and fwd/bkwd 1 blue band STM to the upper arms and to the shoulders in sitting   PATIENT EDUCATION: Education details: POC Person educated: Patient Education method: Explanation Education comprehension: verbalized understanding  HOME EXERCISE PROGRAM: Doorway stretch   ASSESSMENT:  CLINICAL IMPRESSION: Patient continues to c/o right shoulder pain, today more in the trap and rhomboid area.  I tried to do some approximation exercises and more supine  activities. OBJECTIVE IMPAIRMENTS: decreased balance, decreased endurance, decreased ROM, decreased strength, increased muscle spasms, impaired flexibility, impaired UE functional use, postural dysfunction, and pain.   REHAB POTENTIAL: Good  CLINICAL DECISION MAKING: Stable/uncomplicated  EVALUATION COMPLEXITY: Low   GOALS: Goals reviewed with patient? Yes  SHORT TERM GOALS: Target date: 08/31/22  Independent with initial HEP Goal status: met  LONG TERM GOALS: Target date: 11/09/22  Independent with advanced HEP Goal status: INITIAL  2.  Decrease pain overall 50% Goal status: ongoing  3.  Report no difficulty doing hair Goal status: ongoing  4.  Report able to cook Thanksgiving dinner without diffiucty Goal status: partially met  5.  Increase AROM of the shoulders by 20 degrees for all motions Goal status: progressing  PLAN:  PT FREQUENCY: 1-2x/week  PT DURATION: 12 weeks  PLANNED INTERVENTIONS: Therapeutic exercises, Therapeutic activity, Neuromuscular re-education, Balance training, Gait training, Patient/Family education, Self Care, Joint mobilization, Joint manipulation, Dry Needling, Electrical stimulation, Cryotherapy, Moist heat, Taping, Vasopneumatic device, and Manual therapy  PLAN FOR NEXT SESSION: may look at more STM to see if this will help her pain  Sumner Boast, PT 09/27/2022, 5:03 PM

## 2022-10-02 ENCOUNTER — Encounter: Payer: Self-pay | Admitting: Physical Therapy

## 2022-10-02 ENCOUNTER — Ambulatory Visit: Payer: Medicare Other | Admitting: Physical Therapy

## 2022-10-02 DIAGNOSIS — M25511 Pain in right shoulder: Secondary | ICD-10-CM

## 2022-10-02 DIAGNOSIS — M25512 Pain in left shoulder: Secondary | ICD-10-CM

## 2022-10-02 DIAGNOSIS — M6281 Muscle weakness (generalized): Secondary | ICD-10-CM

## 2022-10-02 DIAGNOSIS — R262 Difficulty in walking, not elsewhere classified: Secondary | ICD-10-CM

## 2022-10-02 DIAGNOSIS — R296 Repeated falls: Secondary | ICD-10-CM | POA: Diagnosis not present

## 2022-10-02 NOTE — Therapy (Signed)
OUTPATIENT PHYSICAL THERAPY SHOULDER TREATMENT   Patient Name: Kim Grant MRN: [id number] DOB:1941/08/19, 81 y.o., female Today's Date: 10/02/2022   PT End of Session - 10/02/22 1118     Visit Number 9    Date for PT Re-Evaluation 11/17/22    Authorization Type [employer] medicare    PT Start Time 1118    PT Stop Time 1200    PT Time Calculation (min) 42 min    Activity Tolerance Patient tolerated treatment well    Behavior During Therapy WFL for tasks assessed/performed             Past Medical History:  Diagnosis Date   Anterior tibialis tendinitis 04/18/2016   Arthritis of knee 10/19/2020   Formatting of this note might be different from the original. Added automatically from request for surgery [id number]   Asymptomatic postprocedural ovarian failure 08/04/2014   Bilateral impacted cerumen 05/13/2020   Last Assessment & Plan:  Formatting of this note might be different from the original. HPI:  Complains of stopped up Bilateral ear(s). EXAM: shows cerumen impaction. PLAN: Cerumen removed with various instruments (curettes, suction) giving subjective relief.  External canals and tympanic membranes are otherwise normal.  Return as needed.   Chronic bronchitis ([place]) 08/04/2014   Closed fracture of nasal bone with routine healing 11/02/2021   Last Assessment & Plan:  Formatting of this note might be different from the original. Nasal fracture. Fell with facial trauma about 3 weeks ago.  CT of the face is reviewed and shows minimally displaced nasal fracture.  Denies any nasal obstruction. EXAM shows mild deviation of the nasal dorsum.  Subtle.  Intranasal exam is unremarkable. PLAN: Reassured I think everything is going to be okay with   Depression    DJD (degenerative joint disease), ankle and foot 01/10/2016   Fall 01/20/2017   Fatigue 02/04/2018   Fracture of surgical neck of right humerus 01/20/2017   Hyperlipemia    Mammographic microcalcification found on diagnostic imaging of breast  08/04/2014   Osteoporosis    Tear of left rotator cuff 07/26/2018   Formatting of this note might be different from the original. 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[place]) 05/25/2017   Primary osteoarthritis of left foot 12/02/2015   Age-related osteoporosis without current pathological fracture 08/04/2014   Mixed hyperlipidemia 08/04/2014   Aortic stenosis 08/04/2014   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/2014   Primary osteoarthritis of right knee 06/08/2014    PCP: Josephine Igo  REFERRING PROVIDER: Leslye Peer DIAG: bilateral shoulder pain  THERAPY DIAG:  Acute pain of left shoulder  Acute pain of right shoulder  Difficulty in walking, not elsewhere classified  Muscle weakness (generalized)  Rationale for Evaluation and Treatment Rehabilitation  ONSET DATE: September 2023  SUBJECTIVE:  SUBJECTIVE STATEMENT: Doing okay, right shoulder is still very sore PERTINENT HISTORY: Right shoulder humeral fracture and left shoulder RC repair  PAIN:  Are you having pain? Yes: NPRS scale: 5/10 Pain location: bilateral shoulders  Pain description: sharp, ache Aggravating factors: reaching up, out pain up to 8/10 Relieving factors: rest, OTC pain meds pain at best a 3/10  PRECAUTIONS: None  WEIGHT BEARING RESTRICTIONS: No  FALLS:  Has patient fallen in last 6 months? No  LIVING  ENVIRONMENT: Lives with: lives with their family Lives in: House/apartment Stairs: No Has following equipment at home: None  OCCUPATION: retired  PLOF: Independent and gym 2x/week reports not much walking due to "very poor stamina"  PATIENT GOALS:less pain, stronger, take less pain meds, do hair, reach easier  OBJECTIVE:   DIAGNOSTIC FINDINGS:  None recently  PATIENT SURVEYS:  FOTO 36  COGNITION: Overall cognitive status: Within functional limits for tasks assessed     SENSATION: WFL  POSTURE: Fwd head, rounded shoulders  UPPER EXTREMITY ROM:   Active ROM Right eval Left eval Right AROM 10/02/22  Shoulder flexion 80 100 100  Shoulder extension     Shoulder abduction 70 60 80  Shoulder adduction     Shoulder internal rotation 30 10   Shoulder external rotation 40 50 50  Elbow flexion     Elbow extension     Wrist flexion     Wrist extension     Wrist ulnar deviation     Wrist radial deviation     Wrist pronation     Wrist supination     (Blank rows = not tested)  UPPER EXTREMITY MMT:  MMT Right eval Left eval  Shoulder flexion 3- 3-  Shoulder extension    Shoulder abduction 3- 3-  Shoulder adduction    Shoulder internal rotation 3+ 3+  Shoulder external rotation 3 3  Middle trapezius    Lower trapezius    Elbow flexion 4- 4-  Elbow extension 4- 4-  Wrist flexion    Wrist extension    Wrist ulnar deviation    Wrist radial deviation    Wrist pronation    Wrist supination    Grip strength (lbs)    (Blank rows = not tested)  SHOULDER SPECIAL TESTS: Impingement tests: Neer impingement test: positive    PALPATION:  Very tight in the upper trap, neck, very tender here and in the upper arms and biceps   TODAY'S TREATMENT:                                                                                                                           DATE: 10/02/22 Nustep level 5 x 6 miuntes 10# straight arm pulls 5# chest press Wall slides and  circles 2# wand exercises Ball vs wall STM to the right upper trap, rhomboid and the upper arm PROM of the right shoulder  09/27/22 Nustep level 5 x 6 minutes Supine 2# and 3# stick chest press  and flexion Supine ER and IR with 2# Supine 3# serratus, isometric circles 5# straight arm pulls 15# rows Lats 15# Ball vs wall Ball approximation circles STM to the right upper trap and shoulder  09/25/22 UBE level 1 x 6 minutes Rows 15# Lats 15# Chest press 5# Wall slides Red tband ER and IR Small isometric abduction PROM and STM to both shoiulders  09/20/22 UBE level 3.5 x 5 mintues 10# straight arm pulls 25# triceps 10# biceps Yellow tband ER and IR Small yellow tband abd 2# wand exercises 1 blue band fitter side to side and push/pull Ball vs wall 2 positions AROM right 100 degrees, left 115 degrees STM to the right upper arm  09/18/22 Nustep Level 5 x 6 minutes Seated row 15# 2x10 Lats 15# 2x10 Chest press 5# 2x10 Triceps 20# 2x10 Fitter side to side and push/pull with 1 blue band Yellow tband ER/IR Yellow tband at waist small abduction Wall slides and circles Seated 3# bent over row, and 2# extension Could not do a reverse fly with 1# due to pain   09/04/22 UBE level 3 x 5 minutes Wall slides and circles, tends to do a lot of compensation and has a lot of popping in the left shoulder Seated row 15# 5# biceps 20# triceps Fitter 1 blue band side to side and push/pull Education on relaxing and better mechanics for flexion STM to the lateral upper arms  PATIENT EDUCATION: Education details: POC Person educated: Patient Education method: Explanation Education comprehension: verbalized understanding  HOME EXERCISE PROGRAM: Doorway stretch   ASSESSMENT:  CLINICAL IMPRESSION: Patient continues to c/o right shoulder pain, today more in the trap and rhomboid area.  I added STM to the upper trap and the rhomboids, I measured and she had 20 degree  improvement of flexion.  So we are progressing, just having some increased pain she does have a lot of spasms and tightness in the upper trap and the rhomboid and into the cervical area OBJECTIVE IMPAIRMENTS: decreased balance, decreased endurance, decreased ROM, decreased strength, increased muscle spasms, impaired flexibility, impaired UE functional use, postural dysfunction, and pain.   REHAB POTENTIAL: Good  CLINICAL DECISION MAKING: Stable/uncomplicated  EVALUATION COMPLEXITY: Low   GOALS: Goals reviewed with patient? Yes  SHORT TERM GOALS: Target date: 08/31/22  Independent with initial HEP Goal status: met  LONG TERM GOALS: Target date: 11/09/22  Independent with advanced HEP Goal status: INITIAL  2.  Decrease pain overall 50% Goal status: ongoing  3.  Report no difficulty doing hair Goal status: ongoing  4.  Report able to cook Thanksgiving dinner without diffiucty Goal status: partially met  5.  Increase AROM of the shoulders by 20 degrees for all motions Goal status: progressing  PLAN:  PT FREQUENCY: 1-2x/week  PT DURATION: 12 weeks  PLANNED INTERVENTIONS: Therapeutic exercises, Therapeutic activity, Neuromuscular re-education, Balance training, Gait training, Patient/Family education, Self Care, Joint mobilization, Joint manipulation, Dry Needling, Electrical stimulation, Cryotherapy, Moist heat, Taping, Vasopneumatic device, and Manual therapy  PLAN FOR NEXT SESSION: may look at more STM to see if this will help her pain  Kaydee Magel W, PT 10/02/2022, 11:20 AM

## 2022-10-04 ENCOUNTER — Ambulatory Visit: Payer: Medicare Other | Admitting: Physical Therapy

## 2022-10-04 ENCOUNTER — Encounter: Payer: Self-pay | Admitting: Physical Therapy

## 2022-10-04 DIAGNOSIS — R262 Difficulty in walking, not elsewhere classified: Secondary | ICD-10-CM

## 2022-10-04 DIAGNOSIS — M6281 Muscle weakness (generalized): Secondary | ICD-10-CM | POA: Diagnosis not present

## 2022-10-04 DIAGNOSIS — M25512 Pain in left shoulder: Secondary | ICD-10-CM | POA: Diagnosis not present

## 2022-10-04 DIAGNOSIS — M25511 Pain in right shoulder: Secondary | ICD-10-CM

## 2022-10-04 DIAGNOSIS — R296 Repeated falls: Secondary | ICD-10-CM | POA: Diagnosis not present

## 2022-10-04 NOTE — Therapy (Signed)
OUTPATIENT PHYSICAL THERAPY SHOULDER TREATMENT Progress Note Reporting Period 08/17/22 to 10/04/22  See note below for Objective Data and Assessment of Progress/Goals.      Patient Name: Kim Grant MRN: [id number] DOB:12-Aug-1941, 81 y.o., female Today's Date: 10/04/2022   PT End of Session - 10/04/22 0940     Visit Number 10    Date for PT Re-Evaluation 11/17/22    Authorization Type [employer] medicare    PT Start Time [phone]    PT Stop Time 1025    PT Time Calculation (min) 52 min    Activity Tolerance Patient tolerated treatment well    Behavior During Therapy [facility] for tasks assessed/performed             Past Medical History:  Diagnosis Date   Anterior tibialis tendinitis 04/18/2016   Arthritis of knee 10/19/2020   Formatting of this note might be different from the original. Added automatically from request for surgery [id number]   Asymptomatic postprocedural ovarian failure 08/04/2014   Bilateral impacted cerumen 05/13/2020   Last Assessment & Plan:  Formatting of this note might be different from the original. HPI:  Complains of stopped up Bilateral ear(s). EXAM: shows cerumen impaction. PLAN: Cerumen removed with various instruments (curettes, suction) giving subjective relief.  External canals and tympanic membranes are otherwise normal.  Return as needed.   Chronic bronchitis ([place]) 08/04/2014   Closed fracture of nasal bone with routine healing 11/02/2021   Last Assessment & Plan:  Formatting of this note might be different from the original. Nasal fracture. Fell with facial trauma about 3 weeks ago.  CT of the face is reviewed and shows minimally displaced nasal fracture.  Denies any nasal obstruction. EXAM shows mild deviation of the nasal dorsum.  Subtle.  Intranasal exam is unremarkable. PLAN: Reassured I think everything is going to be okay with   Depression    DJD (degenerative joint disease), ankle and foot 01/10/2016   Fall 01/20/2017   Fatigue 02/04/2018   Fracture of  surgical neck of right humerus 01/20/2017   Hyperlipemia    Mammographic microcalcification found on diagnostic imaging of breast 08/04/2014   Osteoporosis    Tear of left rotator cuff 07/26/2018   Formatting of this note might be different from the original. 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[place]) 05/25/2017   Primary osteoarthritis of left foot 12/02/2015   Age-related osteoporosis without current pathological fracture 08/04/2014   Mixed hyperlipidemia 08/04/2014   Aortic stenosis 08/04/2014   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/2014   Primary osteoarthritis of right knee 06/08/2014    PCP: Josephine Igo  REFERRING PROVIDER: Leslye Peer DIAG: bilateral shoulder pain  THERAPY DIAG:  Acute pain of left shoulder  Acute pain of right shoulder  Difficulty in walking, not elsewhere classified  Muscle weakness (generalized)  Rationale for Evaluation and Treatment Rehabilitation  ONSET DATE: September 2023  SUBJECTIVE:  SUBJECTIVE STATEMENT: I thought last time helped I still get freally sore in the right shoulder with raising it up PERTINENT HISTORY: Right shoulder humeral fracture and left shoulder RC repair  PAIN:  Are you having pain? Yes: NPRS scale: 4/10 Pain location: bilateral shoulders  Pain description: sharp, ache Aggravating factors: reaching up, out pain up to  8/10 Relieving factors: rest, OTC pain meds pain at best a 3/10  PRECAUTIONS: None  WEIGHT BEARING RESTRICTIONS: No  FALLS:  Has patient fallen in last 6 months? No  LIVING ENVIRONMENT: Lives with: lives with their family Lives in: House/apartment Stairs: No Has following equipment at home: None  OCCUPATION: retired  PLOF: Independent and gym 2x/week reports not much walking due to "very poor stamina"  PATIENT GOALS:less pain, stronger, take less pain meds, do hair, reach easier  OBJECTIVE:   DIAGNOSTIC FINDINGS:  None recently  PATIENT SURVEYS:  FOTO 36  COGNITION: Overall cognitive status: Within functional limits for tasks assessed     SENSATION: WFL  POSTURE: Fwd head, rounded shoulders  UPPER EXTREMITY ROM:   Active ROM Right eval Left eval Right AROM 10/02/22  Shoulder flexion 80 100 100  Shoulder extension     Shoulder abduction 70 60 80  Shoulder adduction     Shoulder internal rotation 30 10   Shoulder external rotation 40 50 50  Elbow flexion     Elbow extension     Wrist flexion     Wrist extension     Wrist ulnar deviation     Wrist radial deviation     Wrist pronation     Wrist supination     (Blank rows = not tested)  UPPER EXTREMITY MMT:  MMT Right eval Left eval  Shoulder flexion 3- 3-  Shoulder extension    Shoulder abduction 3- 3-  Shoulder adduction    Shoulder internal rotation 3+ 3+  Shoulder external rotation 3 3  Middle trapezius    Lower trapezius    Elbow flexion 4- 4-  Elbow extension 4- 4-  Wrist flexion    Wrist extension    Wrist ulnar deviation    Wrist radial deviation    Wrist pronation    Wrist supination    Grip strength (lbs)    (Blank rows = not tested)  SHOULDER SPECIAL TESTS: Impingement tests: Neer impingement test: positive    PALPATION:  Very tight in the upper trap, neck, very tender here and in the upper arms and biceps   TODAY'S TREATMENT:                                                                                                                            DATE: 10/04/22 UBE level 4 x 6 minutes 25# triceps 5# biceps Ball vs wall Fitter 1 blue band push/pull, side to side Supine 1# and 2# flexion, 1# and 2# ER/IR Supine 2# flexion, 3# isometric circles Supine 3# serratus with assist PROM of the  right shoulder  10/02/22 Nustep level 5 x 6 miuntes 10# straight arm pulls 5# chest press Wall slides and circles 2# wand exercises Ball vs wall STM to the right upper trap, rhomboid and the upper arm PROM of the right shoulder  09/27/22 Nustep level 5 x 6 minutes Supine 2# and 3# stick chest press and flexion Supine ER and IR with 2# Supine 3# serratus, isometric circles 5# straight arm pulls 15# rows Lats 15# Ball vs wall Ball approximation circles STM to the right upper trap and shoulder  09/25/22 UBE level 1 x 6 minutes Rows 15# Lats 15# Chest press 5# Wall slides Red tband ER and IR Small isometric abduction PROM and STM to both shoiulders  09/20/22 UBE level 3.5 x 5 mintues 10# straight arm pulls 25# triceps 10# biceps Yellow tband ER and IR Small yellow tband abd 2# wand exercises 1 blue band fitter side to side and push/pull Ball vs wall 2 positions AROM right 100 degrees, left 115 degrees STM to the right upper arm  PATIENT EDUCATION: Education details: POC Person educated: Patient Education method: Explanation Education comprehension: verbalized understanding  HOME EXERCISE PROGRAM: Doorway stretch   ASSESSMENT:  CLINICAL IMPRESSION: Patient continues to c/o right shoulder pain, I continue to try to progress strength and stability she needs help with form and posture and at times will have significant popping and pain.  OBJECTIVE IMPAIRMENTS: decreased balance, decreased endurance, decreased ROM, decreased strength, increased muscle spasms, impaired flexibility, impaired UE functional use, postural dysfunction, and pain.    REHAB POTENTIAL: Good  CLINICAL DECISION MAKING: Stable/uncomplicated  EVALUATION COMPLEXITY: Low   GOALS: Goals reviewed with patient? Yes  SHORT TERM GOALS: Target date: 08/31/22  Independent with initial HEP Goal status: met  LONG TERM GOALS: Target date: 11/09/22  Independent with advanced HEP Goal status: INITIAL  2.  Decrease pain overall 50% Goal status: ongoing  3.  Report no difficulty doing hair Goal status: ongoing  4.  Report able to cook Thanksgiving dinner without diffiucty Goal status: partially met  5.  Increase AROM of the shoulders by 20 degrees for all motions Goal status: progressing  PLAN:  PT FREQUENCY: 1-2x/week  PT DURATION: 12 weeks  PLANNED INTERVENTIONS: Therapeutic exercises, Therapeutic activity, Neuromuscular re-education, Balance training, Gait training, Patient/Family education, Self Care, Joint mobilization, Joint manipulation, Dry Needling, Electrical stimulation, Cryotherapy, Moist heat, Taping, Vasopneumatic device, and Manual therapy  PLAN FOR NEXT SESSION: may look at more STM to see if this will help her pain, continue with the functional strength, cabinet reaching  [place], PT 10/04/2022, 9:40 AM

## 2022-10-09 ENCOUNTER — Ambulatory Visit: Payer: Medicare Other | Admitting: Physical Therapy

## 2022-10-09 ENCOUNTER — Encounter: Payer: Self-pay | Admitting: Physical Therapy

## 2022-10-09 DIAGNOSIS — R296 Repeated falls: Secondary | ICD-10-CM | POA: Diagnosis not present

## 2022-10-09 DIAGNOSIS — M25512 Pain in left shoulder: Secondary | ICD-10-CM

## 2022-10-09 DIAGNOSIS — R262 Difficulty in walking, not elsewhere classified: Secondary | ICD-10-CM | POA: Diagnosis not present

## 2022-10-09 DIAGNOSIS — M6281 Muscle weakness (generalized): Secondary | ICD-10-CM | POA: Diagnosis not present

## 2022-10-09 DIAGNOSIS — M25511 Pain in right shoulder: Secondary | ICD-10-CM

## 2022-10-09 IMAGING — DX DG CHEST 1V PORT
1 series · 1 of 1 positions shown · non-contrast
Comparison: 01/03/2020

CLINICAL DATA: Nasal congestion, cough, headache for 3 days

EXAM:
PORTABLE CHEST 1 VIEW

[chest ap]
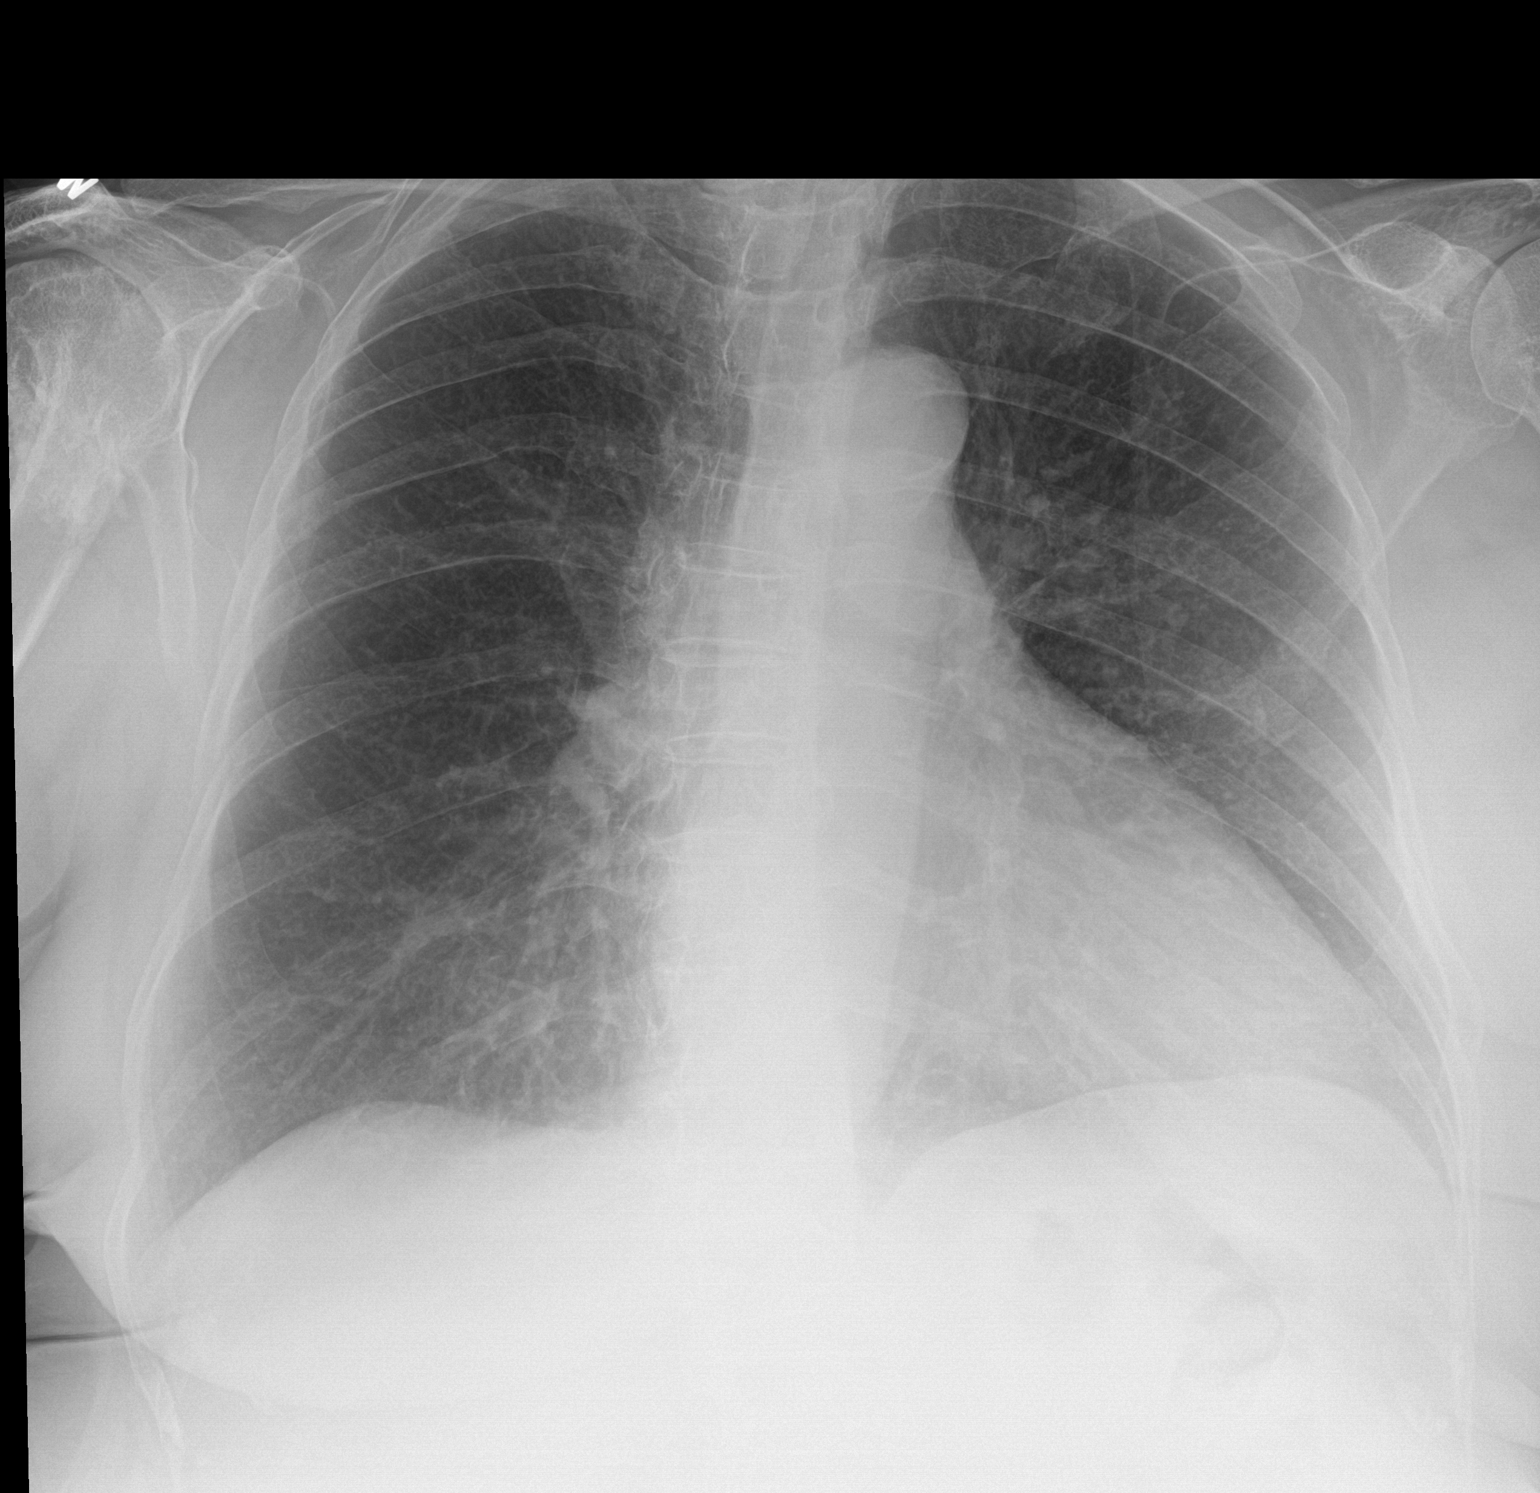

[1 of 1 positions shown; findings below may reference images not displayed]

FINDINGS: Single frontal view of the chest demonstrates an unremarkable
cardiac silhouette. No airspace disease, effusion, or pneumothorax.
No acute bony abnormalities. Chronic posttraumatic changes of the
right shoulder are stable.
IMPRESSION: 1. No acute intrathoracic process.

## 2022-10-09 NOTE — Therapy (Signed)
OUTPATIENT PHYSICAL THERAPY SHOULDER TREATMENT     Patient Name: Kim Grant MRN: [id number] DOB:[birth year], 81 y.o., female Today's Date: 10/09/2022   PT End of Session - 10/09/22 1107     Visit Number 11    Date for PT Re-Evaluation 11/17/22    Authorization Type [employer]BS medicare    PT Start Time 1100    PT Stop Time 1145    PT Time Calculation (min) 45 min    Activity Tolerance Patient tolerated treatment well    Behavior During Therapy [facility] for tasks assessed/performed             Past Medical History:  Diagnosis Date   Anterior tibialis tendinitis 04/18/2016   Arthritis of knee 10/19/2020   Formatting of this note might be different from the original. Added automatically from request for surgery [id number]   Asymptomatic postprocedural ovarian failure 08/04/2014   Bilateral impacted cerumen 05/13/2020   Last Assessment & Plan:  Formatting of this note might be different from the original. HPI:  Complains of stopped up Bilateral ear(s). EXAM: shows cerumen impaction. PLAN: Cerumen removed with various instruments (curettes, suction) giving subjective relief.  External canals and tympanic membranes are otherwise normal.  Return as needed.   Chronic bronchitis ([place]) 08/04/2014   Closed fracture of nasal bone with routine healing 11/02/2021   Last Assessment & Plan:  Formatting of this note might be different from the original. Nasal fracture. Fell with facial trauma about 3 weeks ago.  CT of the face is reviewed and shows minimally displaced nasal fracture.  Denies any nasal obstruction. EXAM shows mild deviation of the nasal dorsum.  Subtle.  Intranasal exam is unremarkable. PLAN: Reassured I think everything is going to be okay with   Depression    DJD (degenerative joint disease), ankle and foot 01/10/2016   Fall 01/20/2017   Fatigue 02/04/2018   Fracture of surgical neck of right humerus 01/20/2017   Hyperlipemia    Mammographic microcalcification found on diagnostic imaging of  breast 08/04/2014   Osteoporosis    Tear of left rotator cuff 07/26/2018   Formatting of this note might be different from the original. 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[place]) 05/25/2017   Primary osteoarthritis of left foot 12/02/2015   Age-related osteoporosis without current pathological fracture 08/04/2014   Mixed hyperlipidemia 08/04/2014   Aortic stenosis 08/04/2014   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 PERTINENT HISTORY: Right shoulder humeral fracture and left shoulder RC repair  PAIN:  Are you having pain? Yes: NPRS scale: 4/10 Pain location: bilateral shoulders  Pain description: sharp, ache Aggravating factors: reaching up, out pain up to 8/10 Relieving factors: rest, OTC pain meds pain at best a 3/10  PRECAUTIONS: None  WEIGHT BEARING RESTRICTIONS:  No  FALLS:  Has patient fallen in last 6 months? No  LIVING ENVIRONMENT: Lives with: lives with their family Lives in: House/apartment Stairs: No Has following equipment at home: None  OCCUPATION: retired  PLOF: Independent and gym 2x/week reports not much walking due to "very poor stamina"  PATIENT GOALS:less pain, stronger, take less pain meds, do hair, reach easier  OBJECTIVE:   DIAGNOSTIC FINDINGS:  None recently  PATIENT SURVEYS:  FOTO 36  COGNITION: Overall cognitive status: Within functional limits for tasks assessed     SENSATION: WFL  POSTURE: Fwd head, rounded shoulders  UPPER EXTREMITY ROM:   Active ROM Right eval Left eval Right AROM 10/02/22  Shoulder flexion 80 100 100  Shoulder extension     Shoulder abduction 70 60 80  Shoulder adduction     Shoulder internal rotation 30 10   Shoulder external rotation 40 50 50  Elbow flexion     Elbow extension     Wrist flexion     Wrist extension     Wrist ulnar deviation     Wrist radial deviation     Wrist pronation     Wrist supination     (Blank rows = not tested)  UPPER EXTREMITY MMT:  MMT Right eval Left eval  Shoulder flexion 3- 3-  Shoulder extension    Shoulder abduction 3- 3-  Shoulder adduction    Shoulder internal rotation 3+ 3+  Shoulder external rotation 3 3  Middle trapezius    Lower trapezius    Elbow flexion 4- 4-  Elbow extension 4- 4-  Wrist flexion    Wrist extension    Wrist ulnar deviation    Wrist radial deviation    Wrist pronation    Wrist supination    Grip strength (lbs)    (Blank rows = not tested)  SHOULDER SPECIAL TESTS: Impingement tests: Neer impingement test: positive    PALPATION:  Very tight in the upper trap, neck, very tender here and in the upper arms and biceps   TODAY'S TREATMENT:                                                                                                                           DATE: 10/09/22 Nustep level 5 x 6  minutes Triceps 25# 2x10 Biceps 5# 2x10 Ball vs wall 2 points Fitter blue band side to side and front to back Yellow tband ER and IR 1# cabinet reaching Supine 1# ER/IR, serratus and flexion PROM right  shoulder with some distraction  10/04/22 UBE level 4 x 6 minutes 25# triceps 5# biceps Ball vs wall Fitter 1 blue band push/pull, side to side Supine 1# and 2# flexion, 1# and 2# ER/IR Supine 2# flexion, 3# isometric circles Supine 3# serratus with assist PROM of the right shoulder  10/02/22 Nustep level 5 x 6 miuntes 10# straight arm pulls 5# chest press Wall slides and circles 2# wand exercises Ball vs wall STM to the right upper trap, rhomboid and the upper arm PROM of the right shoulder  09/27/22 Nustep level 5 x 6 minutes Supine 2# and 3# stick chest press and flexion Supine ER and IR with 2# Supine 3# serratus, isometric circles 5# straight arm pulls 15# rows Lats 15# Ball vs wall Ball approximation circles STM to the right upper trap and shoulder  09/25/22 UBE level 1 x 6 minutes Rows 15# Lats 15# Chest press 5# Wall slides Red tband ER and IR Small isometric abduction PROM and STM to both shoiulders  09/20/22 UBE level 3.5 x 5 mintues 10# straight arm pulls 25# triceps 10# biceps Yellow tband ER and IR Small yellow tband abd 2# wand exercises 1 blue band fitter side to side and push/pull Ball vs wall 2 positions AROM right 100 degrees, left 115 degrees STM to the right upper arm  PATIENT EDUCATION: Education details: POC Person educated: Patient Education method: Explanation Education comprehension: verbalized understanding  HOME EXERCISE PROGRAM: Doorway stretch   ASSESSMENT:  CLINICAL IMPRESSION: Patient reports that she is doing better with less pain overall but some pain with use and soreness with activity.  She was very stiff the first few times with each PROM motion but seemed to loosen up  OBJECTIVE IMPAIRMENTS: decreased  balance, decreased endurance, decreased ROM, decreased strength, increased muscle spasms, impaired flexibility, impaired UE functional use, postural dysfunction, and pain.   REHAB POTENTIAL: Good  CLINICAL DECISION MAKING: Stable/uncomplicated  EVALUATION COMPLEXITY: Low   GOALS: Goals reviewed with patient? Yes  SHORT TERM GOALS: Target date: 08/31/22  Independent with initial HEP Goal status: met  LONG TERM GOALS: Target date: 11/09/22  Independent with advanced HEP Goal status: INITIAL  2.  Decrease pain overall 50% Goal status: ongoing  3.  Report no difficulty doing hair Goal status: partially met  4.  Report able to cook Thanksgiving dinner without diffiucty Goal status: partially met  5.  Increase AROM of the shoulders by 20 degrees for all motions Goal status: progressing  PLAN:  PT FREQUENCY: 1-2x/week  PT DURATION: 12 weeks  PLANNED INTERVENTIONS: Therapeutic exercises, Therapeutic activity, Neuromuscular re-education, Balance training, Gait training, Patient/Family education, Self Care, Joint mobilization, Joint manipulation, Dry Needling, Electrical stimulation, Cryotherapy, Moist heat, Taping, Vasopneumatic device, and Manual therapy  PLAN FOR NEXT SESSION: may look at more STM to see if this will help her pain, continue with the functional strength, cabinet reaching  [place], PT 10/09/2022, 11:08 AM

## 2022-10-11 ENCOUNTER — Ambulatory Visit: Payer: Medicare Other | Admitting: Physical Therapy

## 2022-10-11 ENCOUNTER — Encounter: Payer: Self-pay | Admitting: Physical Therapy

## 2022-10-11 DIAGNOSIS — R296 Repeated falls: Secondary | ICD-10-CM | POA: Diagnosis not present

## 2022-10-11 DIAGNOSIS — M25511 Pain in right shoulder: Secondary | ICD-10-CM | POA: Diagnosis not present

## 2022-10-11 DIAGNOSIS — M25512 Pain in left shoulder: Secondary | ICD-10-CM

## 2022-10-11 DIAGNOSIS — M6281 Muscle weakness (generalized): Secondary | ICD-10-CM | POA: Diagnosis not present

## 2022-10-11 DIAGNOSIS — R262 Difficulty in walking, not elsewhere classified: Secondary | ICD-10-CM | POA: Diagnosis not present

## 2022-10-11 NOTE — Therapy (Signed)
OUTPATIENT PHYSICAL THERAPY SHOULDER TREATMENT     Patient Name: Kim Grant MRN: [id number] DOB:1941/07/09, 81 y.o., female Today's Date: 10/11/2022   PT End of Session - 10/11/22 1108     Visit Number 12    Date for PT Re-Evaluation 11/17/22    Authorization Type [employer]BS medicare    PT Start Time 1100    PT Stop Time 1150    PT Time Calculation (min) 50 min    Activity Tolerance Patient tolerated treatment well    Behavior During Therapy [facility] for tasks assessed/performed             Past Medical History:  Diagnosis Date   Anterior tibialis tendinitis 04/18/2016   Arthritis of knee 10/19/2020   Formatting of this note might be different from the original. Added automatically from request for surgery [id number]   Asymptomatic postprocedural ovarian failure 08/04/2014   Bilateral impacted cerumen 05/13/2020   Last Assessment & Plan:  Formatting of this note might be different from the original. HPI:  Complains of stopped up Bilateral ear(s). EXAM: shows cerumen impaction. PLAN: Cerumen removed with various instruments (curettes, suction) giving subjective relief.  External canals and tympanic membranes are otherwise normal.  Return as needed.   Chronic bronchitis ([place]) 08/04/2014   Closed fracture of nasal bone with routine healing 11/02/2021   Last Assessment & Plan:  Formatting of this note might be different from the original. Nasal fracture. Fell with facial trauma about 3 weeks ago.  CT of the face is reviewed and shows minimally displaced nasal fracture.  Denies any nasal obstruction. EXAM shows mild deviation of the nasal dorsum.  Subtle.  Intranasal exam is unremarkable. PLAN: Reassured I think everything is going to be okay with   Depression    DJD (degenerative joint disease), ankle and foot 01/10/2016   Fall 01/20/2017   Fatigue 02/04/2018   Fracture of surgical neck of right humerus 01/20/2017   Hyperlipemia    Mammographic microcalcification found on diagnostic imaging of  breast 08/04/2014   Osteoporosis    Tear of left rotator cuff 07/26/2018   Formatting of this note might be different from the original. 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[place]) 05/25/2017   Primary osteoarthritis of left foot 12/02/2015   Age-related osteoporosis without current pathological fracture 08/04/2014   Mixed hyperlipidemia 08/04/2014   Aortic stenosis 08/04/2014   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/2014   Primary osteoarthritis of right knee 06/08/2014    PCP: Josephine Igo  REFERRING PROVIDER: Leslye Peer DIAG: bilateral shoulder pain  THERAPY DIAG:  Acute pain of left shoulder  Acute pain of right shoulder  Difficulty in walking, not elsewhere classified  Muscle weakness (generalized)  Repeated falls  Rationale for Evaluation and Treatment Rehabilitation  ONSET DATE: September 2023  SUBJECTIVE:  SUBJECTIVE STATEMENT: I have been doing a lot of cooking and my shoulder is pretty sore.   PERTINENT HISTORY: Right shoulder humeral fracture and left shoulder RC repair  PAIN:  Are you having pain? Yes: NPRS scale: 5/10 Pain location: bilateral shoulders  Pain description: sharp, ache Aggravating factors: reaching up, out pain up to 8/10 Relieving factors: rest, OTC pain meds pain at best a 3/10  PRECAUTIONS: None  WEIGHT BEARING RESTRICTIONS: No  FALLS:   Has patient fallen in last 6 months? No  LIVING ENVIRONMENT: Lives with: lives with their family Lives in: House/apartment Stairs: No Has following equipment at home: None  OCCUPATION: retired  PLOF: Independent and gym 2x/week reports not much walking due to "very poor stamina"  PATIENT GOALS:less pain, stronger, take less pain meds, do hair, reach easier  OBJECTIVE:   DIAGNOSTIC FINDINGS:  None recently  PATIENT SURVEYS:  FOTO 36  COGNITION: Overall cognitive status: Within functional limits for tasks assessed     SENSATION: WFL  POSTURE: Fwd head, rounded shoulders  UPPER EXTREMITY ROM:   Active ROM Right eval Left eval Right AROM 10/02/22  Shoulder flexion 80 100 100  Shoulder extension     Shoulder abduction 70 60 80  Shoulder adduction     Shoulder internal rotation 30 10   Shoulder external rotation 40 50 50  Elbow flexion     Elbow extension     Wrist flexion     Wrist extension     Wrist ulnar deviation     Wrist radial deviation     Wrist pronation     Wrist supination     (Blank rows = not tested)  UPPER EXTREMITY MMT:  MMT Right eval Left eval  Shoulder flexion 3- 3-  Shoulder extension    Shoulder abduction 3- 3-  Shoulder adduction    Shoulder internal rotation 3+ 3+  Shoulder external rotation 3 3  Middle trapezius    Lower trapezius    Elbow flexion 4- 4-  Elbow extension 4- 4-  Wrist flexion    Wrist extension    Wrist ulnar deviation    Wrist radial deviation    Wrist pronation    Wrist supination    Grip strength (lbs)    (Blank rows = not tested)  SHOULDER SPECIAL TESTS: Impingement tests: Neer impingement test: positive    PALPATION:  Very tight in the upper trap, neck, very tender here and in the upper arms and biceps   TODAY'S TREATMENT:                                                                                                                           DATE: 10/11/22 Nustep level 5 x 6 minutes Seated  row 20# Lats 15# Yellow tband ER Yellow tband small abduction Fitter 1 blue band side to side and front to back Ball vs wall 2 places 25# triceps 5# biceps Practiced getting up off the floor, she reports  much easier than 2 months ago 1# cabinet reaching flexion and scaption Yellow tband isometric holds with various PNF angles   10/09/22 Nustep level 5 x 6 minutes Triceps 25# 2x10 Biceps 5# 2x10 Ball vs wall 2 points Fitter blue band side to side and front to back Yellow tband ER and IR 1# cabinet reaching Supine 1# ER/IR, serratus and flexion PROM right shoulder with some distraction  10/04/22 UBE level 4 x 6 minutes 25# triceps 5# biceps Ball vs wall Fitter 1 blue band push/pull, side to side Supine 1# and 2# flexion, 1# and 2# ER/IR Supine 2# flexion, 3# isometric circles Supine 3# serratus with assist PROM of the right shoulder  10/02/22 Nustep level 5 x 6 miuntes 10# straight arm pulls 5# chest press Wall slides and circles 2# wand exercises Ball vs wall STM to the right upper trap, rhomboid and the upper arm PROM of the right shoulder  09/27/22 Nustep level 5 x 6 minutes Supine 2# and 3# stick chest press and flexion Supine ER and IR with 2# Supine 3# serratus, isometric circles 5# straight arm pulls 15# rows Lats 15# Ball vs wall Ball approximation circles STM to the right upper trap and shoulder  09/25/22 UBE level 1 x 6 minutes Rows 15# Lats 15# Chest press 5# Wall slides Red tband ER and IR Small isometric abduction PROM and STM to both shoiulders  PATIENT EDUCATION: Education details: POC Person educated: Patient Education method: Explanation Education comprehension: verbalized understanding  HOME EXERCISE PROGRAM: Doorway stretch   ASSESSMENT:  CLINICAL IMPRESSION: Patient having a little more pain with increased activities of cooking and some cleaning, she was able to get off of the floor today without much difficulty, in the  past she was really struggling with this.  OBJECTIVE IMPAIRMENTS: decreased balance, decreased endurance, decreased ROM, decreased strength, increased muscle spasms, impaired flexibility, impaired UE functional use, postural dysfunction, and pain.   REHAB POTENTIAL: Good  CLINICAL DECISION MAKING: Stable/uncomplicated  EVALUATION COMPLEXITY: Low   GOALS: Goals reviewed with patient? Yes  SHORT TERM GOALS: Target date: 08/31/22  Independent with initial HEP Goal status: met  LONG TERM GOALS: Target date: 11/09/22  Independent with advanced HEP Goal status: ongoing  2.  Decrease pain overall 50% Goal status: ongoing  3.  Report no difficulty doing hair Goal status: partially met  4.  Report able to cook Thanksgiving dinner without diffiucty Goal status: met  5.  Increase AROM of the shoulders by 20 degrees for all motions Goal status: progressing  PLAN:  PT FREQUENCY: 1-2x/week  PT DURATION: 12 weeks  PLANNED INTERVENTIONS: Therapeutic exercises, Therapeutic activity, Neuromuscular re-education, Balance training, Gait training, Patient/Family education, Self Care, Joint mobilization, Joint manipulation, Dry Needling, Electrical stimulation, Cryotherapy, Moist heat, Taping, Vasopneumatic device, and Manual therapy  PLAN FOR NEXT SESSION: may look at more STM to see if this will help her pain, continue with the functional strength, cabinet reaching  [place], PT 10/11/2022, 11:08 AM

## 2022-10-18 ENCOUNTER — Ambulatory Visit: Payer: Medicare Other | Admitting: Physical Therapy

## 2022-10-18 ENCOUNTER — Encounter: Payer: Self-pay | Admitting: Physical Therapy

## 2022-10-18 DIAGNOSIS — R296 Repeated falls: Secondary | ICD-10-CM | POA: Diagnosis not present

## 2022-10-18 DIAGNOSIS — M6281 Muscle weakness (generalized): Secondary | ICD-10-CM | POA: Diagnosis not present

## 2022-10-18 DIAGNOSIS — M25512 Pain in left shoulder: Secondary | ICD-10-CM

## 2022-10-18 DIAGNOSIS — M25511 Pain in right shoulder: Secondary | ICD-10-CM

## 2022-10-18 DIAGNOSIS — R262 Difficulty in walking, not elsewhere classified: Secondary | ICD-10-CM

## 2022-10-18 NOTE — Therapy (Signed)
OUTPATIENT PHYSICAL THERAPY SHOULDER TREATMENT     Patient Name: Kim Grant MRN: [id number] DOB:[birth year], 81 y.o., female Today's Date: 10/18/2022   PT End of Session - 10/18/22 0813     Visit Number 13    Date for PT Re-Evaluation 11/17/22    Authorization Type [employer] medicare    PT Start Time 0808    PT Stop Time 0848    PT Time Calculation (min) 40 min    Activity Tolerance Patient tolerated treatment well    Behavior During Therapy [facility] for tasks assessed/performed             Past Medical History:  Diagnosis Date   Anterior tibialis tendinitis 04/18/2016   Arthritis of knee 10/19/2020   Formatting of this note might be different from the original. Added automatically from request for surgery [id number]   Asymptomatic postprocedural ovarian failure 08/04/2014   Bilateral impacted cerumen 05/13/2020   Last Assessment & Plan:  Formatting of this note might be different from the original. HPI:  Complains of stopped up Bilateral ear(s). EXAM: shows cerumen impaction. PLAN: Cerumen removed with various instruments (curettes, suction) giving subjective relief.  External canals and tympanic membranes are otherwise normal.  Return as needed.   Chronic bronchitis ([place]) 08/04/2014   Closed fracture of nasal bone with routine healing 11/02/2021   Last Assessment & Plan:  Formatting of this note might be different from the original. Nasal fracture. Fell with facial trauma about 3 weeks ago.  CT of the face is reviewed and shows minimally displaced nasal fracture.  Denies any nasal obstruction. EXAM shows mild deviation of the nasal dorsum.  Subtle.  Intranasal exam is unremarkable. PLAN: Reassured I think everything is going to be okay with   Depression    DJD (degenerative joint disease), ankle and foot 01/10/2016   Fall 01/20/2017   Fatigue 02/04/2018   Fracture of surgical neck of right humerus 01/20/2017   Hyperlipemia    Mammographic microcalcification found on diagnostic imaging of  breast 08/04/2014   Osteoporosis    Tear of left rotator cuff 07/26/2018   Formatting of this note might be different from the original. 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[place]) 05/25/2017   Primary osteoarthritis of left foot 12/02/2015   Age-related osteoporosis without current pathological fracture 08/04/2014   Mixed hyperlipidemia 08/04/2014   Aortic stenosis 08/04/2014   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/2014   Primary osteoarthritis of right knee 06/08/2014    PCP: Josephine Igo  REFERRING PROVIDER: Leslye Peer DIAG: bilateral shoulder pain  THERAPY DIAG:  Acute pain of left shoulder  Acute pain of right shoulder  Difficulty in walking, not elsewhere classified  Muscle weakness (generalized)  Repeated falls  Rationale for Evaluation and Treatment Rehabilitation  ONSET DATE: September 2023  SUBJECTIVE:  SUBJECTIVE STATEMENT: I did too much over the holidays, up and cooking and cleaning, very tired and stiff and sore PERTINENT HISTORY: Right shoulder humeral fracture and left shoulder RC repair  PAIN:  Are you having pain? Yes: NPRS scale: 7/10 Pain location: bilateral shoulders  Pain description: sharp, ache Aggravating factors: reaching up, out pain up to 8/10 Relieving factors: rest, OTC pain meds pain at best a 3/10  PRECAUTIONS: None  WEIGHT BEARING  RESTRICTIONS: No  FALLS:  Has patient fallen in last 6 months? No  LIVING ENVIRONMENT: Lives with: lives with their family Lives in: House/apartment Stairs: No Has following equipment at home: None  OCCUPATION: retired  PLOF: Independent and gym 2x/week reports not much walking due to "very poor stamina"  PATIENT GOALS:less pain, stronger, take less pain meds, do hair, reach easier  OBJECTIVE:   DIAGNOSTIC FINDINGS:  None recently  PATIENT SURVEYS:  FOTO 36  COGNITION: Overall cognitive status: Within functional limits for tasks assessed     SENSATION: WFL  POSTURE: Fwd head, rounded shoulders  UPPER EXTREMITY ROM:   Active ROM Right eval Left eval Right AROM 10/02/22  Shoulder flexion 80 100 100  Shoulder extension     Shoulder abduction 70 60 80  Shoulder adduction     Shoulder internal rotation 30 10   Shoulder external rotation 40 50 50  Elbow flexion     Elbow extension     Wrist flexion     Wrist extension     Wrist ulnar deviation     Wrist radial deviation     Wrist pronation     Wrist supination     (Blank rows = not tested)  UPPER EXTREMITY MMT:  MMT Right eval Left eval  Shoulder flexion 3- 3-  Shoulder extension    Shoulder abduction 3- 3-  Shoulder adduction    Shoulder internal rotation 3+ 3+  Shoulder external rotation 3 3  Middle trapezius    Lower trapezius    Elbow flexion 4- 4-  Elbow extension 4- 4-  Wrist flexion    Wrist extension    Wrist ulnar deviation    Wrist radial deviation    Wrist pronation    Wrist supination    Grip strength (lbs)    (Blank rows = not tested)  SHOULDER SPECIAL TESTS: Impingement tests: Neer impingement test: positive    PALPATION:  Very tight in the upper trap, neck, very tender here and in the upper arms and biceps   TODAY'S TREATMENT:                                                                                                                           DATE: 10/18/22 Nustep  level 5 x 6 minutes Wall slides Fitter 1 blue band side to side and front to back Seated row 15# LAts 15# Wand extensions and IR STM to the right shoulder and neck area PROM right shoulder  10/11/22 Nustep level 5  x 6 minutes Seated row 20# Lats 15# Yellow tband ER Yellow tband small abduction Fitter 1 blue band side to side and front to back Ball vs wall 2 places 25# triceps 5# biceps Practiced getting up off the floor, she reports much easier than 2 months ago 1# cabinet reaching flexion and scaption Yellow tband isometric holds with various PNF angles   10/09/22 Nustep level 5 x 6 minutes Triceps 25# 2x10 Biceps 5# 2x10 Ball vs wall 2 points Fitter blue band side to side and front to back Yellow tband ER and IR 1# cabinet reaching Supine 1# ER/IR, serratus and flexion PROM right shoulder with some distraction  10/04/22 UBE level 4 x 6 minutes 25# triceps 5# biceps Ball vs wall Fitter 1 blue band push/pull, side to side Supine 1# and 2# flexion, 1# and 2# ER/IR Supine 2# flexion, 3# isometric circles Supine 3# serratus with assist PROM of the right shoulder  10/02/22 Nustep level 5 x 6 miuntes 10# straight arm pulls 5# chest press Wall slides and circles 2# wand exercises Ball vs wall STM to the right upper trap, rhomboid and the upper arm PROM of the right shoulder  PATIENT EDUCATION: Education details: POC Person educated: Patient Education method: Explanation Education comprehension: verbalized understanding  HOME EXERCISE PROGRAM: Doorway stretch   ASSESSMENT:  CLINICAL IMPRESSION: Patient having a little more pain with increased activities of cooking and some cleaning, she reports that she has a stiff neck and missed out on some sleep, has some knots in the neck and the upper trap  OBJECTIVE IMPAIRMENTS: decreased balance, decreased endurance, decreased ROM, decreased strength, increased muscle spasms, impaired flexibility, impaired UE  functional use, postural dysfunction, and pain.   REHAB POTENTIAL: Good  CLINICAL DECISION MAKING: Stable/uncomplicated  EVALUATION COMPLEXITY: Low   GOALS: Goals reviewed with patient? Yes  SHORT TERM GOALS: Target date: 08/31/22  Independent with initial HEP Goal status: met  LONG TERM GOALS: Target date: 11/09/22  Independent with advanced HEP Goal status: ongoing  2.  Decrease pain overall 50% Goal status: ongoing  3.  Report no difficulty doing hair Goal status: partially met  4.  Report able to cook Thanksgiving dinner without diffiucty Goal status: met  5.  Increase AROM of the shoulders by 20 degrees for all motions Goal status: progressing  PLAN:  PT FREQUENCY: 1-2x/week  PT DURATION: 12 weeks  PLANNED INTERVENTIONS: Therapeutic exercises, Therapeutic activity, Neuromuscular re-education, Balance training, Gait training, Patient/Family education, Self Care, Joint mobilization, Joint manipulation, Dry Needling, Electrical stimulation, Cryotherapy, Moist heat, Taping, Vasopneumatic device, and Manual therapy  PLAN FOR NEXT SESSION: may look at more STM to see if this will help her pain, continue with the functional strength, cabinet reaching  [place], PT 10/18/2022, 8:13 AM

## 2022-10-24 ENCOUNTER — Ambulatory Visit: Payer: Medicare Other | Attending: Sports Medicine | Admitting: Physical Therapy

## 2022-10-24 ENCOUNTER — Encounter: Payer: Self-pay | Admitting: Family

## 2022-10-24 ENCOUNTER — Encounter: Payer: Self-pay | Admitting: Physical Therapy

## 2022-10-24 DIAGNOSIS — M25511 Pain in right shoulder: Secondary | ICD-10-CM | POA: Diagnosis not present

## 2022-10-24 DIAGNOSIS — R262 Difficulty in walking, not elsewhere classified: Secondary | ICD-10-CM | POA: Diagnosis not present

## 2022-10-24 DIAGNOSIS — R296 Repeated falls: Secondary | ICD-10-CM | POA: Insufficient documentation

## 2022-10-24 DIAGNOSIS — M6281 Muscle weakness (generalized): Secondary | ICD-10-CM

## 2022-10-24 DIAGNOSIS — M25512 Pain in left shoulder: Secondary | ICD-10-CM | POA: Diagnosis not present

## 2022-10-24 NOTE — Therapy (Signed)
OUTPATIENT PHYSICAL THERAPY SHOULDER TREATMENT     Patient Name: Kim Grant MRN: [id number] DOB:1940-12-14, 82 y.o., female Today's Date: 10/24/2022   PT End of Session - 10/24/22 1536     Visit Number 14    Date for PT Re-Evaluation 11/17/22    Authorization Type [employer] medicare    PT Start Time 1530    PT Stop Time 1615    PT Time Calculation (min) 45 min    Activity Tolerance Patient tolerated treatment well    Behavior During Therapy [facility] for tasks assessed/performed             Past Medical History:  Diagnosis Date   Anterior tibialis tendinitis 04/18/2016   Arthritis of knee 10/19/2020   Formatting of this note might be different from the original. Added automatically from request for surgery [id number]   Asymptomatic postprocedural ovarian failure 08/04/2014   Bilateral impacted cerumen 05/13/2020   Last Assessment & Plan:  Formatting of this note might be different from the original. HPI:  Complains of stopped up Bilateral ear(s). EXAM: shows cerumen impaction. PLAN: Cerumen removed with various instruments (curettes, suction) giving subjective relief.  External canals and tympanic membranes are otherwise normal.  Return as needed.   Chronic bronchitis ([place]) 08/04/2014   Closed fracture of nasal bone with routine healing 11/02/2021   Last Assessment & Plan:  Formatting of this note might be different from the original. Nasal fracture. Fell with facial trauma about 3 weeks ago.  CT of the face is reviewed and shows minimally displaced nasal fracture.  Denies any nasal obstruction. EXAM shows mild deviation of the nasal dorsum.  Subtle.  Intranasal exam is unremarkable. PLAN: Reassured I think everything is going to be okay with   Depression    DJD (degenerative joint disease), ankle and foot 01/10/2016   Fall 01/20/2017   Fatigue 02/04/2018   Fracture of surgical neck of right humerus 01/20/2017   Hyperlipemia    Mammographic microcalcification found on diagnostic imaging of breast  08/04/2014   Osteoporosis    Tear of left rotator cuff 07/26/2018   Formatting of this note might be different from the original. 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   Barrett's esophagus without dysplasia 11/25/2018   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[place]) 05/25/2017   Primary osteoarthritis of left foot 12/02/2015   Age-related osteoporosis without current pathological fracture 08/04/2014   Mixed hyperlipidemia 08/04/2014   Aortic stenosis 08/04/2014   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/2014   Primary osteoarthritis of right knee 06/08/2014    PCP: Josephine Igo  REFERRING PROVIDER: Leslye Peer DIAG: bilateral shoulder pain  THERAPY DIAG:  Acute pain of left shoulder  Acute pain of right shoulder  Difficulty in walking, not elsewhere classified  Muscle weakness (generalized)  Rationale for Evaluation and Treatment Rehabilitation  ONSET DATE: September 2023  SUBJECTIVE:  SUBJECTIVE STATEMENT: I am still tired but a little less pain.  Sore in the right shoulder PERTINENT HISTORY: Right shoulder humeral fracture and left shoulder RC repair  PAIN:  Are you having pain? Yes: NPRS scale: 4/10 Pain location: bilateral shoulders  Pain description: sharp, ache Aggravating factors: reaching up, out pain up to 8/10 Relieving factors: rest, OTC pain meds pain at best a 3/10  PRECAUTIONS: None  WEIGHT BEARING RESTRICTIONS: No  FALLS:  Has patient fallen in last  6 months? No  LIVING ENVIRONMENT: Lives with: lives with their family Lives in: House/apartment Stairs: No Has following equipment at home: None  OCCUPATION: retired  PLOF: Independent and gym 2x/week reports not much walking due to "very poor stamina"  PATIENT GOALS:less pain, stronger, take less pain meds, do hair, reach easier  OBJECTIVE:   DIAGNOSTIC FINDINGS:  None recently  PATIENT SURVEYS:  FOTO 36  COGNITION: Overall cognitive status: Within functional limits for tasks assessed     SENSATION: WFL  POSTURE: Fwd head, rounded shoulders  UPPER EXTREMITY ROM:   Active ROM Right eval Left eval Right AROM 10/02/22  Shoulder flexion 80 100 100  Shoulder extension     Shoulder abduction 70 60 80  Shoulder adduction     Shoulder internal rotation 30 10   Shoulder external rotation 40 50 50  Elbow flexion     Elbow extension     Wrist flexion     Wrist extension     Wrist ulnar deviation     Wrist radial deviation     Wrist pronation     Wrist supination     (Blank rows = not tested)  UPPER EXTREMITY MMT:  MMT Right eval Left eval  Shoulder flexion 3- 3-  Shoulder extension    Shoulder abduction 3- 3-  Shoulder adduction    Shoulder internal rotation 3+ 3+  Shoulder external rotation 3 3  Middle trapezius    Lower trapezius    Elbow flexion 4- 4-  Elbow extension 4- 4-  Wrist flexion    Wrist extension    Wrist ulnar deviation    Wrist radial deviation    Wrist pronation    Wrist supination    Grip strength (lbs)    (Blank rows = not tested)  SHOULDER SPECIAL TESTS: Impingement tests: Neer impingement test: positive    PALPATION:  Very tight in the upper trap, neck, very tender here and in the upper arms and biceps   TODAY'S TREATMENT:                                                                                                                           DATE: 10/24/22 Nustep level 5 x 6 minutes 5# straight arm pulls Green tband  rows Red tband ER and abduction at sides Supine 2# punches and serratus, flexion and ER/IR 2# cabinet reaching wand extensions Fitter 1 blue band STM and PROM of the right shoulder  10/18/22 Nustep  level 5 x 6 minutes Wall slides Fitter 1 blue band side to side and front to back Seated row 15# LAts 15# Wand extensions and IR STM to the right shoulder and neck area PROM right shoulder  10/11/22 Nustep level 5 x 6 minutes Seated row 20# Lats 15# Yellow tband ER Yellow tband small abduction Fitter 1 blue band side to side and front to back Ball vs wall 2 places 25# triceps 5# biceps Practiced getting up off the floor, she reports much easier than 2 months ago 1# cabinet reaching flexion and scaption Yellow tband isometric holds with various PNF angles   10/09/22 Nustep level 5 x 6 minutes Triceps 25# 2x10 Biceps 5# 2x10 Ball vs wall 2 points Fitter blue band side to side and front to back Yellow tband ER and IR 1# cabinet reaching Supine 1# ER/IR, serratus and flexion PROM right shoulder with some distraction  10/04/22 UBE level 4 x 6 minutes 25# triceps 5# biceps Ball vs wall Fitter 1 blue band push/pull, side to side Supine 1# and 2# flexion, 1# and 2# ER/IR Supine 2# flexion, 3# isometric circles Supine 3# serratus with assist PROM of the right shoulder  10/02/22 Nustep level 5 x 6 miuntes 10# straight arm pulls 5# chest press Wall slides and circles 2# wand exercises Ball vs wall STM to the right upper trap, rhomboid and the upper arm PROM of the right shoulder  PATIENT EDUCATION: Education details: POC Person educated: Patient Education method: Explanation Education comprehension: verbalized understanding  HOME EXERCISE PROGRAM: Doorway stretch   ASSESSMENT:  CLINICAL IMPRESSION: Patient still with the increased pain and fatigue of the holiday, she is overall some better, she was much stiffer today and was tight and painful into ER and  flexion  OBJECTIVE IMPAIRMENTS: decreased balance, decreased endurance, decreased ROM, decreased strength, increased muscle spasms, impaired flexibility, impaired UE functional use, postural dysfunction, and pain.   REHAB POTENTIAL: Good  CLINICAL DECISION MAKING: Stable/uncomplicated  EVALUATION COMPLEXITY: Low   GOALS: Goals reviewed with patient? Yes  SHORT TERM GOALS: Target date: 08/31/22  Independent with initial HEP Goal status: met  LONG TERM GOALS: Target date: 11/09/22  Independent with advanced HEP Goal status: ongoing  2.  Decrease pain overall 50% Goal status: ongoing  3.  Report no difficulty doing hair Goal status: partially met  4.  Report able to cook Thanksgiving dinner without diffiucty Goal status: met  5.  Increase AROM of the shoulders by 20 degrees for all motions Goal status: progressing  PLAN:  PT FREQUENCY: 1-2x/week  PT DURATION: 12 weeks  PLANNED INTERVENTIONS: Therapeutic exercises, Therapeutic activity, Neuromuscular re-education, Balance training, Gait training, Patient/Family education, Self Care, Joint mobilization, Joint manipulation, Dry Needling, Electrical stimulation, Cryotherapy, Moist heat, Taping, Vasopneumatic device, and Manual therapy  PLAN FOR NEXT SESSION: may look at more STM to see if this will help her pain, continue with the functional strength, cabinet reaching  [place], PT 10/24/2022, 3:37 PM

## 2022-10-26 ENCOUNTER — Ambulatory Visit: Payer: Medicare Other | Admitting: Physical Therapy

## 2022-10-27 ENCOUNTER — Inpatient Hospital Stay: Payer: Medicare Other

## 2022-10-27 ENCOUNTER — Inpatient Hospital Stay: Payer: Medicare Other | Attending: Hematology & Oncology

## 2022-10-27 VITALS — BP 128/68 | HR 70 | Temp 97.8°F | Resp 17

## 2022-10-27 DIAGNOSIS — N189 Chronic kidney disease, unspecified: Secondary | ICD-10-CM | POA: Insufficient documentation

## 2022-10-27 DIAGNOSIS — D631 Anemia in chronic kidney disease: Secondary | ICD-10-CM | POA: Diagnosis not present

## 2022-10-27 DIAGNOSIS — D509 Iron deficiency anemia, unspecified: Secondary | ICD-10-CM

## 2022-10-27 LAB — CBC WITH DIFFERENTIAL (CANCER CENTER ONLY)
Abs Immature Granulocytes: 0.02 10*3/uL (ref 0.00–0.07)
Basophils Absolute: 0.1 10*3/uL (ref 0.0–0.1)
Basophils Relative: 1 %
Eosinophils Absolute: 0.2 10*3/uL (ref 0.0–0.5)
Eosinophils Relative: 3 %
HCT: 32.5 % — ABNORMAL LOW (ref 36.0–46.0)
Hemoglobin: 10.4 g/dL — ABNORMAL LOW (ref 12.0–15.0)
Immature Granulocytes: 0 %
Lymphocytes Relative: 24 %
Lymphs Abs: 1.5 10*3/uL (ref 0.7–4.0)
MCH: 27.5 pg (ref 26.0–34.0)
MCHC: 32 g/dL (ref 30.0–36.0)
MCV: 86 fL (ref 80.0–100.0)
Monocytes Absolute: 0.5 10*3/uL (ref 0.1–1.0)
Monocytes Relative: 8 %
Neutro Abs: 4.1 10*3/uL (ref 1.7–7.7)
Neutrophils Relative %: 64 %
Platelet Count: 284 10*3/uL (ref 150–400)
RBC: 3.78 MIL/uL — ABNORMAL LOW (ref 3.87–5.11)
RDW: 13.9 % (ref 11.5–15.5)
WBC Count: 6.4 10*3/uL (ref 4.0–10.5)
nRBC: 0 % (ref 0.0–0.2)

## 2022-10-27 LAB — CMP (CANCER CENTER ONLY)
ALT: 22 U/L (ref 0–44)
AST: 23 U/L (ref 15–41)
Albumin: 4.5 g/dL (ref 3.5–5.0)
Alkaline Phosphatase: 62 U/L (ref 38–126)
Anion gap: 8 (ref 5–15)
BUN: 27 mg/dL — ABNORMAL HIGH (ref 8–23)
CO2: 28 mmol/L (ref 22–32)
Calcium: 9.9 mg/dL (ref 8.9–10.3)
Chloride: 100 mmol/L (ref 98–111)
Creatinine: 1.32 mg/dL — ABNORMAL HIGH (ref 0.44–1.00)
GFR, Estimated: 41 mL/min — ABNORMAL LOW (ref >60)
Glucose, Bld: 99 mg/dL (ref 70–99)
Potassium: 4.3 mmol/L (ref 3.5–5.1)
Sodium: 136 mmol/L (ref 135–145)
Total Bilirubin: 0.4 mg/dL (ref 0.3–1.2)
Total Protein: 7.3 g/dL (ref 6.5–8.1)

## 2022-10-27 MED ORDER — EPOETIN ALFA-EPBX 40000 UNIT/ML IJ SOLN
40000.0000 [IU] | Freq: Once | INTRAMUSCULAR | Status: AC
  Administered 2022-10-27: 40000 [IU] via SUBCUTANEOUS
  Filled 2022-10-27: qty 1

## 2022-10-27 NOTE — Patient Instructions (Signed)

## 2022-10-31 ENCOUNTER — Encounter: Payer: Self-pay | Admitting: Physical Therapy

## 2022-10-31 ENCOUNTER — Ambulatory Visit: Payer: Medicare Other | Admitting: Physical Therapy

## 2022-10-31 DIAGNOSIS — M25512 Pain in left shoulder: Secondary | ICD-10-CM

## 2022-10-31 DIAGNOSIS — R296 Repeated falls: Secondary | ICD-10-CM

## 2022-10-31 DIAGNOSIS — M25511 Pain in right shoulder: Secondary | ICD-10-CM

## 2022-10-31 DIAGNOSIS — M6281 Muscle weakness (generalized): Secondary | ICD-10-CM

## 2022-10-31 DIAGNOSIS — R262 Difficulty in walking, not elsewhere classified: Secondary | ICD-10-CM

## 2022-10-31 NOTE — Therapy (Signed)
OUTPATIENT PHYSICAL THERAPY SHOULDER TREATMENT     Patient Name: Kim Grant MRN: [id number] DOB:Sep 30, 1941, 82 y.o., female Today's Date: 10/31/2022   PT End of Session - 10/31/22 1054     Visit Number 15    Date for PT Re-Evaluation 11/17/22    Authorization Type [employer] medicare    PT Start Time 1054    PT Stop Time 1144    PT Time Calculation (min) 50 min    Activity Tolerance Patient tolerated treatment well    Behavior During Therapy [facility] for tasks assessed/performed             Past Medical History:  Diagnosis Date   Anterior tibialis tendinitis 04/18/2016   Arthritis of knee 10/19/2020   Formatting of this note might be different from the original. Added automatically from request for surgery [id number]   Asymptomatic postprocedural ovarian failure 08/04/2014   Bilateral impacted cerumen 05/13/2020   Last Assessment & Plan:  Formatting of this note might be different from the original. HPI:  Complains of stopped up Bilateral ear(s). EXAM: shows cerumen impaction. PLAN: Cerumen removed with various instruments (curettes, suction) giving subjective relief.  External canals and tympanic membranes are otherwise normal.  Return as needed.   Chronic bronchitis (HCC) 08/04/2014   Closed fracture of nasal bone with routine healing 11/02/2021   Last Assessment & Plan:  Formatting of this note might be different from the original. Nasal fracture. Fell with facial trauma about 3 weeks ago.  CT of the face is reviewed and shows minimally displaced nasal fracture.  Denies any nasal obstruction. EXAM shows mild deviation of the nasal dorsum.  Subtle.  Intranasal exam is unremarkable. PLAN: Reassured I think everything is going to be okay with   Depression    DJD (degenerative joint disease), ankle and foot 01/10/2016   Fall 01/20/2017   Fatigue 02/04/2018   Fracture of surgical neck of right humerus 01/20/2017   Hyperlipemia    Mammographic microcalcification found on diagnostic imaging of breast  08/04/2014   Osteoporosis    Tear of left rotator cuff 07/26/2018   Formatting of this note might be different from the original. 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   Barrett's esophagus without dysplasia 11/25/2018   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HCC) 05/25/2017   Primary osteoarthritis of left foot 12/02/2015   Age-related osteoporosis without current pathological fracture 08/04/2014   Mixed hyperlipidemia 08/04/2014   Aortic stenosis 08/04/2014   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/2014   Primary osteoarthritis of right knee 06/08/2014    PCP: Fanny Bien  REFERRING PROVIDER: Kristine Royal DIAG: bilateral shoulder pain  THERAPY DIAG:  Acute pain of left shoulder  Acute pain of right shoulder  Difficulty in walking, not elsewhere classified  Muscle weakness (generalized)  Repeated falls  Rationale for Evaluation and Treatment Rehabilitation  ONSET DATE: September 2023  SUBJECTIVE:  SUBJECTIVE STATEMENT: I am short of breath today.  My right arm hurts when I move it, massaging it helps.  Hurts with dressing and doing hair, better with cooking PERTINENT HISTORY: Right shoulder humeral fracture and left shoulder RC repair  PAIN:  Are you having pain? Yes: NPRS scale: 4/10 Pain location: bilateral shoulders  Pain description: sharp, ache Aggravating factors: reaching up, out pain up to 8/10 Relieving factors: rest, OTC pain meds pain at best a  3/10  PRECAUTIONS: None  WEIGHT BEARING RESTRICTIONS: No  FALLS:  Has patient fallen in last 6 months? No  LIVING ENVIRONMENT: Lives with: lives with their family Lives in: House/apartment Stairs: No Has following equipment at home: None  OCCUPATION: retired  PLOF: Independent and gym 2x/week reports not much walking due to "very poor stamina"  PATIENT GOALS:less pain, stronger, take less pain meds, do hair, reach easier  OBJECTIVE:   DIAGNOSTIC FINDINGS:  None recently  PATIENT SURVEYS:  FOTO 36  COGNITION: Overall cognitive status: Within functional limits for tasks assessed     SENSATION: WFL  POSTURE: Fwd head, rounded shoulders  UPPER EXTREMITY ROM:   Active ROM Right eval Left eval Right AROM 10/02/22 Right AROM 10/31/22   Shoulder flexion 80 100 100 110  Shoulder extension      Shoulder abduction 70 60 80 87  Shoulder adduction      Shoulder internal rotation 30 10    Shoulder external rotation 40 50 50 55  Elbow flexion      Elbow extension      Wrist flexion      Wrist extension      Wrist ulnar deviation      Wrist radial deviation      Wrist pronation      Wrist supination      (Blank rows = not tested)  UPPER EXTREMITY MMT:  MMT Right eval Left eval  Shoulder flexion 3- 3-  Shoulder extension    Shoulder abduction 3- 3-  Shoulder adduction    Shoulder internal rotation 3+ 3+  Shoulder external rotation 3 3  Middle trapezius    Lower trapezius    Elbow flexion 4- 4-  Elbow extension 4- 4-  Wrist flexion    Wrist extension    Wrist ulnar deviation    Wrist radial deviation    Wrist pronation    Wrist supination    Grip strength (lbs)    (Blank rows = not tested)  SHOULDER SPECIAL TESTS: Impingement tests: Neer impingement test: positive    PALPATION:  Very tight in the upper trap, neck, very tender here and in the upper arms and biceps   TODAY'S TREATMENT:                                                                                                                            DATE: 10/31/22 Nustep level 5 x 6 minutes 5# straight arm pulls 2x10, tried 10# but could not  do Serratus roll up wall with foam roll Fitter 1 blue band side to side and front to back 15# rows 15# lats 5# chest press AAROM in sitting AROM then with overpressure to her painful end range in supine PROM in supine STM to the right shoulder  10/24/22 Nustep level 5 x 6 minutes 5# straight arm pulls Green tband rows Red tband ER and abduction at sides Supine 2# punches and serratus, flexion and ER/IR 2# cabinet reaching wand extensions Fitter 1 blue band STM and PROM of the right shoulder  10/18/22 Nustep level 5 x 6 minutes Wall slides Fitter 1 blue band side to side and front to back Seated row 15# LAts 15# Wand extensions and IR STM to the right shoulder and neck area PROM right shoulder  10/11/22 Nustep level 5 x 6 minutes Seated row 20# Lats 15# Yellow tband ER Yellow tband small abduction Fitter 1 blue band side to side and front to back Ball vs wall 2 places 25# triceps 5# biceps Practiced getting up off the floor, she reports much easier than 2 months ago 1# cabinet reaching flexion and scaption Yellow tband isometric holds with various PNF angles   10/09/22 Nustep level 5 x 6 minutes Triceps 25# 2x10 Biceps 5# 2x10 Ball vs wall 2 points Fitter blue band side to side and front to back Yellow tband ER and IR 1# cabinet reaching Supine 1# ER/IR, serratus and flexion PROM right shoulder with some distraction  PATIENT EDUCATION: Education details: POC Person educated: Patient Education method: Explanation Education comprehension: verbalized understanding  HOME EXERCISE PROGRAM: Doorway stretch   ASSESSMENT:  CLINICAL IMPRESSION: Patient with a nice increase in ROM of the right shoulder of about 7-10 degrees for all motions, she still struggles above 80 degrees with pain, she does get very  tender in the shoulder mms  OBJECTIVE IMPAIRMENTS: decreased balance, decreased endurance, decreased ROM, decreased strength, increased muscle spasms, impaired flexibility, impaired UE functional use, postural dysfunction, and pain.   REHAB POTENTIAL: Good  CLINICAL DECISION MAKING: Stable/uncomplicated  EVALUATION COMPLEXITY: Low   GOALS: Goals reviewed with patient? Yes  SHORT TERM GOALS: Target date: 08/31/22  Independent with initial HEP Goal status: met  LONG TERM GOALS: Target date: 11/09/22  Independent with advanced HEP Goal status: ongoing  2.  Decrease pain overall 50% Goal status: ongoing  3.  Report no difficulty doing hair Goal status: partially met  4.  Report able to cook Thanksgiving dinner without diffiucty Goal status: met  5.  Increase AROM of the shoulders by 20 degrees for all motions Goal status: progressing  PLAN:  PT FREQUENCY: 1-2x/week  PT DURATION: 12 weeks  PLANNED INTERVENTIONS: Therapeutic exercises, Therapeutic activity, Neuromuscular re-education, Balance training, Gait training, Patient/Family education, Self Care, Joint mobilization, Joint manipulation, Dry Needling, Electrical stimulation, Cryotherapy, Moist heat, Taping, Vasopneumatic device, and Manual therapy  PLAN FOR NEXT SESSION:  continue with the functional strength, cabinet reaching, eccentrics  Kimbly Eanes W, PT 10/31/2022, 10:55 AM

## 2022-11-02 ENCOUNTER — Ambulatory Visit: Payer: Medicare Other | Admitting: Physical Therapy

## 2022-11-02 ENCOUNTER — Encounter: Payer: Self-pay | Admitting: Physical Therapy

## 2022-11-02 DIAGNOSIS — M6281 Muscle weakness (generalized): Secondary | ICD-10-CM

## 2022-11-02 DIAGNOSIS — M25511 Pain in right shoulder: Secondary | ICD-10-CM

## 2022-11-02 DIAGNOSIS — R296 Repeated falls: Secondary | ICD-10-CM | POA: Diagnosis not present

## 2022-11-02 DIAGNOSIS — R262 Difficulty in walking, not elsewhere classified: Secondary | ICD-10-CM

## 2022-11-02 DIAGNOSIS — M25512 Pain in left shoulder: Secondary | ICD-10-CM

## 2022-11-02 NOTE — Therapy (Signed)
OUTPATIENT PHYSICAL THERAPY SHOULDER TREATMENT     Patient Name: Adena Sima MRN: [id number] DOB:1940/11/27, 82 y.o., female Today's Date: 11/02/2022   PT End of Session - 11/02/22 1104     Visit Number 16    Date for PT Re-Evaluation 11/17/22    Authorization Type [employer] medicare    PT Start Time 1058    PT Stop Time 1145    PT Time Calculation (min) 47 min    Activity Tolerance Patient tolerated treatment well    Behavior During Therapy [facility] for tasks assessed/performed             Past Medical History:  Diagnosis Date   Anterior tibialis tendinitis 04/18/2016   Arthritis of knee 10/19/2020   Formatting of this note might be different from the original. Added automatically from request for surgery [id number]   Asymptomatic postprocedural ovarian failure 08/04/2014   Bilateral impacted cerumen 05/13/2020   Last Assessment & Plan:  Formatting of this note might be different from the original. HPI:  Complains of stopped up Bilateral ear(s). EXAM: shows cerumen impaction. PLAN: Cerumen removed with various instruments (curettes, suction) giving subjective relief.  External canals and tympanic membranes are otherwise normal.  Return as needed.   Chronic bronchitis ([place]) 08/04/2014   Closed fracture of nasal bone with routine healing 11/02/2021   Last Assessment & Plan:  Formatting of this note might be different from the original. Nasal fracture. Fell with facial trauma about 3 weeks ago.  CT of the face is reviewed and shows minimally displaced nasal fracture.  Denies any nasal obstruction. EXAM shows mild deviation of the nasal dorsum.  Subtle.  Intranasal exam is unremarkable. PLAN: Reassured I think everything is going to be okay with   Depression    DJD (degenerative joint disease), ankle and foot 01/10/2016   Fall 01/20/2017   Fatigue 02/04/2018   Fracture of surgical neck of right humerus 01/20/2017   Hyperlipemia    Mammographic microcalcification found on diagnostic imaging of  breast 08/04/2014   Osteoporosis    Tear of left rotator cuff 07/26/2018   Formatting of this note might be different from the original. 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[place]) 05/25/2017   Primary osteoarthritis of left foot 12/02/2015   Age-related osteoporosis without current pathological fracture 08/04/2014   Mixed hyperlipidemia 08/04/2014   Aortic stenosis 08/04/2014   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/2014   Primary osteoarthritis of right knee 06/08/2014    PCP: Josephine Igo  REFERRING PROVIDER: Leslye Peer DIAG: bilateral shoulder pain  THERAPY DIAG:  Acute pain of left shoulder  Acute pain of right shoulder  Difficulty in walking, not elsewhere classified  Muscle weakness (generalized)  Repeated falls  Rationale for Evaluation and Treatment Rehabilitation  ONSET DATE: September 2023  SUBJECTIVE:  SUBJECTIVE STATEMENT: I am sdoing pretty good, sore on the back of the right shoulder but I can tell I have better ROM PERTINENT HISTORY: Right shoulder humeral fracture and left shoulder RC repair  PAIN:  Are you having pain? Yes: NPRS scale: 4/10 Pain location: bilateral shoulders  Pain description: sharp, ache Aggravating factors: reaching up, out pain up to 8/10 Relieving factors: rest, OTC pain meds pain at best a 3/10  PRECAUTIONS: None  WEIGHT BEARING  RESTRICTIONS: No  FALLS:  Has patient fallen in last 6 months? No  LIVING ENVIRONMENT: Lives with: lives with their family Lives in: House/apartment Stairs: No Has following equipment at home: None  OCCUPATION: retired  PLOF: Independent and gym 2x/week reports not much walking due to "very poor stamina"  PATIENT GOALS:less pain, stronger, take less pain meds, do hair, reach easier  OBJECTIVE:   DIAGNOSTIC FINDINGS:  None recently  PATIENT SURVEYS:  FOTO 36  COGNITION: Overall cognitive status: Within functional limits for tasks assessed     SENSATION: WFL  POSTURE: Fwd head, rounded shoulders  UPPER EXTREMITY ROM:   Active ROM Right eval Left eval Right AROM 10/02/22 Right AROM 10/31/22   Shoulder flexion 80 100 100 110  Shoulder extension      Shoulder abduction 70 60 80 87  Shoulder adduction      Shoulder internal rotation 30 10    Shoulder external rotation 40 50 50 55  Elbow flexion      Elbow extension      Wrist flexion      Wrist extension      Wrist ulnar deviation      Wrist radial deviation      Wrist pronation      Wrist supination      (Blank rows = not tested)  UPPER EXTREMITY MMT:  MMT Right eval Left eval  Shoulder flexion 3- 3-  Shoulder extension    Shoulder abduction 3- 3-  Shoulder adduction    Shoulder internal rotation 3+ 3+  Shoulder external rotation 3 3  Middle trapezius    Lower trapezius    Elbow flexion 4- 4-  Elbow extension 4- 4-  Wrist flexion    Wrist extension    Wrist ulnar deviation    Wrist radial deviation    Wrist pronation    Wrist supination    Grip strength (lbs)    (Blank rows = not tested)  SHOULDER SPECIAL TESTS: Impingement tests: Neer impingement test: positive    PALPATION:  Very tight in the upper trap, neck, very tender here and in the upper arms and biceps   TODAY'S TREATMENT:                                                                                                                            DATE: 11/02/22 UBE level 4 x 6 minutes 2# and 3# WATE bar reaching up to a point on the finger ladder 2# and 3#  bent over row, bent over extension and reverse flies 20# triceps 5# biceps 10# straight arm pulls Supine 3# serratus push and isometric circles Passive stretch to the right shoulder STM to the right rhomboid, levator area  10/31/22 Nustep level 5 x 6 minutes 5# straight arm pulls 2x10, tried 10# but could not do Serratus roll up wall with foam roll Fitter 1 blue band side to side and front to back 15# rows 15# lats 5# chest press AAROM in sitting AROM then with overpressure to her painful end range in supine PROM in supine STM to the right shoulder  10/24/22 Nustep level 5 x 6 minutes 5# straight arm pulls Green tband rows Red tband ER and abduction at sides Supine 2# punches and serratus, flexion and ER/IR 2# cabinet reaching wand extensions Fitter 1 blue band STM and PROM of the right shoulder  10/18/22 Nustep level 5 x 6 minutes Wall slides Fitter 1 blue band side to side and front to back Seated row 15# LAts 15# Wand extensions and IR STM to the right shoulder and neck area PROM right shoulder  10/11/22 Nustep level 5 x 6 minutes Seated row 20# Lats 15# Yellow tband ER Yellow tband small abduction Fitter 1 blue band side to side and front to back Ball vs wall 2 places 25# triceps 5# biceps Practiced getting up off the floor, she reports much easier than 2 months ago 1# cabinet reaching flexion and scaption Yellow tband isometric holds with various PNF angles  PATIENT EDUCATION: Education details: POC Person educated: Patient Education method: Explanation Education comprehension: verbalized understanding  HOME EXERCISE PROGRAM: Doorway stretch   ASSESSMENT:  CLINICAL IMPRESSION: Patient was able to reach with wright bar up the finger ladder with good motion and minimal pain.  She is very sore and tender with a knot in the  rhomboid and the levator area today with an increase c/o pain here OBJECTIVE IMPAIRMENTS: decreased balance, decreased endurance, decreased ROM, decreased strength, increased muscle spasms, impaired flexibility, impaired UE functional use, postural dysfunction, and pain.   REHAB POTENTIAL: Good  CLINICAL DECISION MAKING: Stable/uncomplicated  EVALUATION COMPLEXITY: Low   GOALS: Goals reviewed with patient? Yes  SHORT TERM GOALS: Target date: 08/31/22  Independent with initial HEP Goal status: met  LONG TERM GOALS: Target date: 11/09/22  Independent with advanced HEP Goal status: ongoing  2.  Decrease pain overall 50% Goal status: ongoing  3.  Report no difficulty doing hair Goal status: partially met  4.  Report able to cook Thanksgiving dinner without diffiucty Goal status: met  5.  Increase AROM of the shoulders by 20 degrees for all motions Goal status: progressing  PLAN:  PT FREQUENCY: 1-2x/week  PT DURATION: 12 weeks  PLANNED INTERVENTIONS: Therapeutic exercises, Therapeutic activity, Neuromuscular re-education, Balance training, Gait training, Patient/Family education, Self Care, Joint mobilization, Joint manipulation, Dry Needling, Electrical stimulation, Cryotherapy, Moist heat, Taping, Vasopneumatic device, and Manual therapy  PLAN FOR NEXT SESSION:  continue with the functional strength, cabinet reaching, eccentrics  Jalin Alicea W, PT 11/02/2022, 11:05 AM

## 2022-11-07 ENCOUNTER — Ambulatory Visit: Payer: Medicare Other | Admitting: Physical Therapy

## 2022-11-08 ENCOUNTER — Encounter: Payer: Self-pay | Admitting: Family

## 2022-11-14 ENCOUNTER — Encounter: Payer: Self-pay | Admitting: Family

## 2022-11-14 ENCOUNTER — Ambulatory Visit: Payer: Medicare Other | Admitting: Physical Therapy

## 2022-11-14 ENCOUNTER — Encounter: Payer: Self-pay | Admitting: Physical Therapy

## 2022-11-14 DIAGNOSIS — R296 Repeated falls: Secondary | ICD-10-CM

## 2022-11-14 DIAGNOSIS — M25512 Pain in left shoulder: Secondary | ICD-10-CM

## 2022-11-14 DIAGNOSIS — M25511 Pain in right shoulder: Secondary | ICD-10-CM | POA: Diagnosis not present

## 2022-11-14 DIAGNOSIS — M6281 Muscle weakness (generalized): Secondary | ICD-10-CM

## 2022-11-14 DIAGNOSIS — R262 Difficulty in walking, not elsewhere classified: Secondary | ICD-10-CM | POA: Diagnosis not present

## 2022-11-14 NOTE — Therapy (Signed)
OUTPATIENT PHYSICAL THERAPY SHOULDER TREATMENT     Patient Name: Kim Grant MRN: [id number] DOB:1940/12/15, 82 y.o., female Today's Date: 11/14/2022   PT End of Session - 11/14/22 1059     Visit Number 17    Date for PT Re-Evaluation 11/17/22    Authorization Type [employer] medicare    PT Start Time 1057    PT Stop Time 1143    PT Time Calculation (min) 46 min    Activity Tolerance Patient tolerated treatment well    Behavior During Therapy [facility] for tasks assessed/performed             Past Medical History:  Diagnosis Date   Anterior tibialis tendinitis 04/18/2016   Arthritis of knee 10/19/2020   Formatting of this note might be different from the original. Added automatically from request for surgery [id number]   Asymptomatic postprocedural ovarian failure 08/04/2014   Bilateral impacted cerumen 05/13/2020   Last Assessment & Plan:  Formatting of this note might be different from the original. HPI:  Complains of stopped up Bilateral ear(s). EXAM: shows cerumen impaction. PLAN: Cerumen removed with various instruments (curettes, suction) giving subjective relief.  External canals and tympanic membranes are otherwise normal.  Return as needed.   Chronic bronchitis ([place]) 08/04/2014   Closed fracture of nasal bone with routine healing 11/02/2021   Last Assessment & Plan:  Formatting of this note might be different from the original. Nasal fracture. Fell with facial trauma about 3 weeks ago.  CT of the face is reviewed and shows minimally displaced nasal fracture.  Denies any nasal obstruction. EXAM shows mild deviation of the nasal dorsum.  Subtle.  Intranasal exam is unremarkable. PLAN: Reassured I think everything is going to be okay with   Depression    DJD (degenerative joint disease), ankle and foot 01/10/2016   Fall 01/20/2017   Fatigue 02/04/2018   Fracture of surgical neck of right humerus 01/20/2017   Hyperlipemia    Mammographic microcalcification found on diagnostic imaging of  breast 08/04/2014   Osteoporosis    Tear of left rotator cuff 07/26/2018   Formatting of this note might be different from the original. 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[place]) 05/25/2017   Primary osteoarthritis of left foot 12/02/2015   Age-related osteoporosis without current pathological fracture 08/04/2014   Mixed hyperlipidemia 08/04/2014   Aortic stenosis 08/04/2014   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/2014   Primary osteoarthritis of right knee 06/08/2014    PCP: Josephine Igo  REFERRING PROVIDER: Leslye Peer DIAG: bilateral shoulder pain  THERAPY DIAG:  Acute pain of left shoulder  Acute pain of right shoulder  Difficulty in walking, not elsewhere classified  Muscle weakness (generalized)  Repeated falls  Rationale for Evaluation and Treatment Rehabilitation  ONSET DATE: September 2023  SUBJECTIVE:  SUBJECTIVE STATEMENT: Doing well, I am able to do my chores better and easier with less pain, still hurting and at times it will really ache PERTINENT HISTORY: Right shoulder humeral fracture and left shoulder RC repair  PAIN:  Are you having pain? Yes: NPRS scale: 4/10 Pain location: bilateral shoulders  Pain description: sharp, ache Aggravating factors: reaching up, out pain up to 8/10 Relieving factors: rest, OTC pain meds pain at best a 3/10  PRECAUTIONS:  None  WEIGHT BEARING RESTRICTIONS: No  FALLS:  Has patient fallen in last 6 months? No  LIVING ENVIRONMENT: Lives with: lives with their family Lives in: House/apartment Stairs: No Has following equipment at home: None  OCCUPATION: retired  PLOF: Independent and gym 2x/week reports not much walking due to "very poor stamina"  PATIENT GOALS:less pain, stronger, take less pain meds, do hair, reach easier  OBJECTIVE:   DIAGNOSTIC FINDINGS:  None recently  PATIENT SURVEYS:  FOTO 36  COGNITION: Overall cognitive status: Within functional limits for tasks assessed     SENSATION: WFL  POSTURE: Fwd head, rounded shoulders  UPPER EXTREMITY ROM:   Active ROM Right eval Left eval Right AROM 10/02/22 Right AROM 10/31/22  Right AROM 11/14/22  Shoulder flexion 80 100 100 110 115  Shoulder extension       Shoulder abduction 70 60 80 87 90  Shoulder adduction       Shoulder internal rotation 30 10     Shoulder external rotation 40 50 50 55   Elbow flexion       Elbow extension       Wrist flexion       Wrist extension       Wrist ulnar deviation       Wrist radial deviation       Wrist pronation       Wrist supination       (Blank rows = not tested)  UPPER EXTREMITY MMT:  MMT Right eval Left eval  Shoulder flexion 3- 3-  Shoulder extension    Shoulder abduction 3- 3-  Shoulder adduction    Shoulder internal rotation 3+ 3+  Shoulder external rotation 3 3  Middle trapezius    Lower trapezius    Elbow flexion 4- 4-  Elbow extension 4- 4-  Wrist flexion    Wrist extension    Wrist ulnar deviation    Wrist radial deviation    Wrist pronation    Wrist supination    Grip strength (lbs)    (Blank rows = not tested)  SHOULDER SPECIAL TESTS: Impingement tests: Neer impingement test: positive    PALPATION:  Very tight in the upper trap, neck, very tender here and in the upper arms and biceps   TODAY'S TREATMENT:                                                                                                                            DATE: 11/14/22 Nustep level 5  x 6 minutes Triceps 25# 2x10 5# biceps 10# straight arm pulls Wate bar 2# reaching to finger ladder Ball rolling up wall Serratus roll with 1/2 foam roller Supine PROM Supine 2# ER/IR, punches, isometric circles and flexion  11/02/22 UBE level 4 x 6 minutes 2# and 3# WATE bar reaching up to a point on the finger ladder 2# and 3# bent over row, bent over extension and reverse flies 20# triceps 5# biceps 10# straight arm pulls Supine 3# serratus push and isometric circles Passive stretch to the right shoulder STM to the right rhomboid, levator area  10/31/22 Nustep level 5 x 6 minutes 5# straight arm pulls 2x10, tried 10# but could not do Serratus roll up wall with foam roll Fitter 1 blue band side to side and front to back 15# rows 15# lats 5# chest press AAROM in sitting AROM then with overpressure to her painful end range in supine PROM in supine STM to the right shoulder  10/24/22 Nustep level 5 x 6 minutes 5# straight arm pulls Green tband rows Red tband ER and abduction at sides Supine 2# punches and serratus, flexion and ER/IR 2# cabinet reaching wand extensions Fitter 1 blue band STM and PROM of the right shoulder  10/18/22 Nustep level 5 x 6 minutes Wall slides Fitter 1 blue band side to side and front to back Seated row 15# LAts 15# Wand extensions and IR STM to the right shoulder and neck area PROM right shoulder  PATIENT EDUCATION: Education details: POC Person educated: Patient Education method: Explanation Education comprehension: verbalized understanding  HOME EXERCISE PROGRAM: Doorway stretch   ASSESSMENT:  CLINICAL IMPRESSION: Patient doing well with her home exercises, reports sometimes stiff and a lot of popping, but overall doing better.  She seemed to have a little more pain with the PROM OBJECTIVE IMPAIRMENTS: decreased  balance, decreased endurance, decreased ROM, decreased strength, increased muscle spasms, impaired flexibility, impaired UE functional use, postural dysfunction, and pain.   REHAB POTENTIAL: Good  CLINICAL DECISION MAKING: Stable/uncomplicated  EVALUATION COMPLEXITY: Low   GOALS: Goals reviewed with patient? Yes  SHORT TERM GOALS: Target date: 08/31/22  Independent with initial HEP Goal status: met  LONG TERM GOALS: Target date: 11/09/22  Independent with advanced HEP Goal status: ongoing  2.  Decrease pain overall 50% Goal status: ongoing  3.  Report no difficulty doing hair Goal status: partially met  4.  Report able to cook Thanksgiving dinner without diffiucty Goal status: met  5.  Increase AROM of the shoulders by 20 degrees for all motions Goal status: progressing  PLAN:  PT FREQUENCY: 1-2x/week  PT DURATION: 12 weeks  PLANNED INTERVENTIONS: Therapeutic exercises, Therapeutic activity, Neuromuscular re-education, Balance training, Gait training, Patient/Family education, Self Care, Joint mobilization, Joint manipulation, Dry Needling, Electrical stimulation, Cryotherapy, Moist heat, Taping, Vasopneumatic device, and Manual therapy  PLAN FOR NEXT SESSION:  continue with the functional strength, cabinet reaching, eccentrics  Meria Crilly W, PT 11/14/2022, 11:00 AM

## 2022-11-16 ENCOUNTER — Encounter: Payer: Self-pay | Admitting: Physical Therapy

## 2022-11-16 ENCOUNTER — Ambulatory Visit: Payer: Medicare Other | Admitting: Physical Therapy

## 2022-11-16 DIAGNOSIS — M25512 Pain in left shoulder: Secondary | ICD-10-CM

## 2022-11-16 DIAGNOSIS — M6281 Muscle weakness (generalized): Secondary | ICD-10-CM

## 2022-11-16 DIAGNOSIS — R262 Difficulty in walking, not elsewhere classified: Secondary | ICD-10-CM

## 2022-11-16 DIAGNOSIS — M25511 Pain in right shoulder: Secondary | ICD-10-CM | POA: Diagnosis not present

## 2022-11-16 DIAGNOSIS — R296 Repeated falls: Secondary | ICD-10-CM | POA: Diagnosis not present

## 2022-11-16 NOTE — Therapy (Signed)
OUTPATIENT PHYSICAL THERAPY SHOULDER TREATMENT     Patient Name: Kim Grant MRN: [id number] DOB:[birth year], 82 y.o., female Today's Date: 11/16/2022   PT End of Session - 11/16/22 1101     Visit Number 18    Date for PT Re-Evaluation 11/17/22    Authorization Type [employer] medicare    PT Start Time 1100    PT Stop Time 1148    PT Time Calculation (min) 48 min    Activity Tolerance Patient tolerated treatment well    Behavior During Therapy [facility] for tasks assessed/performed             Past Medical History:  Diagnosis Date   Anterior tibialis tendinitis 04/18/2016   Arthritis of knee 10/19/2020   Formatting of this note might be different from the original. Added automatically from request for surgery [id number]   Asymptomatic postprocedural ovarian failure 08/04/2014   Bilateral impacted cerumen 05/13/2020   Last Assessment & Plan:  Formatting of this note might be different from the original. HPI:  Complains of stopped up Bilateral ear(s). EXAM: shows cerumen impaction. PLAN: Cerumen removed with various instruments (curettes, suction) giving subjective relief.  External canals and tympanic membranes are otherwise normal.  Return as needed.   Chronic bronchitis ([facility]) 08/04/2014   Closed fracture of nasal bone with routine healing 11/02/2021   Last Assessment & Plan:  Formatting of this note might be different from the original. Nasal fracture. Fell with facial trauma about 3 weeks ago.  CT of the face is reviewed and shows minimally displaced nasal fracture.  Denies any nasal obstruction. EXAM shows mild deviation of the nasal dorsum.  Subtle.  Intranasal exam is unremarkable. PLAN: Reassured I think everything is going to be okay with   Depression    DJD (degenerative joint disease), ankle and foot 01/10/2016   Fall 01/20/2017   Fatigue 02/04/2018   Fracture of surgical neck of right humerus 01/20/2017   Hyperlipemia    Mammographic microcalcification found on diagnostic imaging of  breast 08/04/2014   Osteoporosis    Tear of left rotator cuff 07/26/2018   Formatting of this note might be different from the original. 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   Barrett's esophagus without dysplasia 11/25/2018   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[facility]) 05/25/2017   Primary osteoarthritis of left foot 12/02/2015   Age-related osteoporosis without current pathological fracture 08/04/2014   Mixed hyperlipidemia 08/04/2014   Aortic stenosis 08/04/2014   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/2014   Primary osteoarthritis of right knee 06/08/2014    PCP: Fanny Bien  REFERRING PROVIDER: Kristine Royal DIAG: bilateral shoulder pain  THERAPY DIAG:  Acute pain of left shoulder  Acute pain of right shoulder  Difficulty in walking, not elsewhere classified  Muscle weakness (generalized)  Rationale for Evaluation and Treatment Rehabilitation  ONSET DATE: September 2023  SUBJECTIVE:  SUBJECTIVE STATEMENT: I am a little sore int he right biceps area PERTINENT HISTORY: Right shoulder humeral fracture and left shoulder RC repair  PAIN:  Are you having pain? Yes: NPRS scale: 4/10 Pain location: bilateral shoulders  Pain description: sharp, ache Aggravating factors: reaching up, out pain up to 8/10 Relieving factors: rest, OTC pain meds pain at best a 3/10  PRECAUTIONS: None  WEIGHT BEARING RESTRICTIONS: No  FALLS:  Has patient fallen in last 6 months?  No  LIVING ENVIRONMENT: Lives with: lives with their family Lives in: House/apartment Stairs: No Has following equipment at home: None  OCCUPATION: retired  PLOF: Independent and gym 2x/week reports not much walking due to "very poor stamina"  PATIENT GOALS:less pain, stronger, take less pain meds, do hair, reach easier  OBJECTIVE:   DIAGNOSTIC FINDINGS:  None recently  PATIENT SURVEYS:  FOTO 36  COGNITION: Overall cognitive status: Within functional limits for tasks assessed     SENSATION: WFL  POSTURE: Fwd head, rounded shoulders  UPPER EXTREMITY ROM:   Active ROM Right eval Left eval Right AROM 10/02/22 Right AROM 10/31/22  Right AROM 11/14/22  Shoulder flexion 80 100 100 110 115  Shoulder extension       Shoulder abduction 70 60 80 87 90  Shoulder adduction       Shoulder internal rotation 30 10     Shoulder external rotation 40 50 50 55   Elbow flexion       Elbow extension       Wrist flexion       Wrist extension       Wrist ulnar deviation       Wrist radial deviation       Wrist pronation       Wrist supination       (Blank rows = not tested)  UPPER EXTREMITY MMT:  MMT Right eval Left eval  Shoulder flexion 3- 3-  Shoulder extension    Shoulder abduction 3- 3-  Shoulder adduction    Shoulder internal rotation 3+ 3+  Shoulder external rotation 3 3  Middle trapezius    Lower trapezius    Elbow flexion 4- 4-  Elbow extension 4- 4-  Wrist flexion    Wrist extension    Wrist ulnar deviation    Wrist radial deviation    Wrist pronation    Wrist supination    Grip strength (lbs)    (Blank rows = not tested)  SHOULDER SPECIAL TESTS: Impingement tests: Neer impingement test: positive    PALPATION:  Very tight in the upper trap, neck, very tender here and in the upper arms and biceps   TODAY'S TREATMENT:                                                                                                                            DATE: 11/16/22 UBE level 4 x 6 minutes Chest press 5# 2x10 Wand extension 3# Wand IR up back  3# 3# bar reaching up the finger ladder 10# straight arm pulls 1,2 and 3# cabinet reaching flexion and scapular planes Supine 2# ER/IR, 3# punches and isometric circles 1# and 2# flexion in supine Some PROM  11/14/22 Nustep level 5 x 6 minutes Triceps 25# 2x10 5# biceps 10# straight arm pulls Wate bar 2# reaching to finger ladder Ball rolling up wall Serratus roll with 1/2 foam roller Supine PROM Supine 2# ER/IR, punches, isometric circles and flexion  11/02/22 UBE level 4 x 6 minutes 2# and 3# WATE bar reaching up to a point on the finger ladder 2# and 3# bent over row, bent over extension and reverse flies 20# triceps 5# biceps 10# straight arm pulls Supine 3# serratus push and isometric circles Passive stretch to the right shoulder STM to the right rhomboid, levator area  10/31/22 Nustep level 5 x 6 minutes 5# straight arm pulls 2x10, tried 10# but could not do Serratus roll up wall with foam roll Fitter 1 blue band side to side and front to back 15# rows 15# lats 5# chest press AAROM in sitting AROM then with overpressure to her painful end range in supine PROM in supine STM to the right shoulder  10/24/22 Nustep level 5 x 6 minutes 5# straight arm pulls Green tband rows Red tband ER and abduction at sides Supine 2# punches and serratus, flexion and ER/IR 2# cabinet reaching wand extensions Fitter 1 blue band STM and PROM of the right shoulder  10/18/22 Nustep level 5 x 6 minutes Wall slides Fitter 1 blue band side to side and front to back Seated row 15# LAts 15# Wand extensions and IR STM to the right shoulder and neck area PROM right shoulder  PATIENT EDUCATION: Education details: POC Person educated: Patient Education method: Explanation Education comprehension: verbalized understanding  HOME EXERCISE PROGRAM: Doorway stretch    ASSESSMENT:  CLINICAL IMPRESSION: Added a little more strength functionally with the cabinet reaching, she fatigues easily and struggles with the first two reps but after that she does really well OBJECTIVE IMPAIRMENTS: decreased balance, decreased endurance, decreased ROM, decreased strength, increased muscle spasms, impaired flexibility, impaired UE functional use, postural dysfunction, and pain.   REHAB POTENTIAL: Good  CLINICAL DECISION MAKING: Stable/uncomplicated  EVALUATION COMPLEXITY: Low   GOALS: Goals reviewed with patient? Yes  SHORT TERM GOALS: Target date: 08/31/22  Independent with initial HEP Goal status: met  LONG TERM GOALS: Target date: 11/09/22  Independent with advanced HEP Goal status: ongoing  2.  Decrease pain overall 50% Goal status: ongoing  3.  Report no difficulty doing hair Goal status: partially met  4.  Report able to cook Thanksgiving dinner without diffiucty Goal status: met  5.  Increase AROM of the shoulders by 20 degrees for all motions Goal status: progressing  PLAN:  PT FREQUENCY: 1-2x/week  PT DURATION: 12 weeks  PLANNED INTERVENTIONS: Therapeutic exercises, Therapeutic activity, Neuromuscular re-education, Balance training, Gait training, Patient/Family education, Self Care, Joint mobilization, Joint manipulation, Dry Needling, Electrical stimulation, Cryotherapy, Moist heat, Taping, Vasopneumatic device, and Manual therapy  PLAN FOR NEXT SESSION:  continue with the functional strength, cabinet reaching, eccentrics  Neyra Pettie W, PT 11/16/2022, 11:07 AM

## 2022-11-21 ENCOUNTER — Encounter: Payer: Self-pay | Admitting: Physical Therapy

## 2022-11-21 ENCOUNTER — Ambulatory Visit: Payer: Medicare Other | Admitting: Physical Therapy

## 2022-11-21 DIAGNOSIS — M25511 Pain in right shoulder: Secondary | ICD-10-CM | POA: Diagnosis not present

## 2022-11-21 DIAGNOSIS — M25512 Pain in left shoulder: Secondary | ICD-10-CM | POA: Diagnosis not present

## 2022-11-21 DIAGNOSIS — R296 Repeated falls: Secondary | ICD-10-CM | POA: Diagnosis not present

## 2022-11-21 DIAGNOSIS — R262 Difficulty in walking, not elsewhere classified: Secondary | ICD-10-CM

## 2022-11-21 DIAGNOSIS — M6281 Muscle weakness (generalized): Secondary | ICD-10-CM

## 2022-11-21 NOTE — Therapy (Signed)
OUTPATIENT PHYSICAL THERAPY SHOULDER TREATMENT     Patient Name: Kim Grant MRN: [id number] DOB:13-Jun-1941, 82 y.o., female Today's Date: 11/21/2022   PT End of Session - 11/21/22 1105     Visit Number 19    Date for PT Re-Evaluation 12/22/22    Authorization Type [employer] medicare    PT Start Time 1059    PT Stop Time 1148    PT Time Calculation (min) 49 min    Activity Tolerance Patient tolerated treatment well    Behavior During Therapy WFL for tasks assessed/performed             Past Medical History:  Diagnosis Date   Anterior tibialis tendinitis 04/18/2016   Arthritis of knee 10/19/2020   Formatting of this note might be different from the original. Added automatically from request for surgery [id number]   Asymptomatic postprocedural ovarian failure 08/04/2014   Bilateral impacted cerumen 05/13/2020   Last Assessment & Plan:  Formatting of this note might be different from the original. HPI:  Complains of stopped up Bilateral ear(s). EXAM: shows cerumen impaction. PLAN: Cerumen removed with various instruments (curettes, suction) giving subjective relief.  External canals and tympanic membranes are otherwise normal.  Return as needed.   Chronic bronchitis (HCC) 08/04/2014   Closed fracture of nasal bone with routine healing 11/02/2021   Last Assessment & Plan:  Formatting of this note might be different from the original. Nasal fracture. Fell with facial trauma about 3 weeks ago.  CT of the face is reviewed and shows minimally displaced nasal fracture.  Denies any nasal obstruction. EXAM shows mild deviation of the nasal dorsum.  Subtle.  Intranasal exam is unremarkable. PLAN: Reassured I think everything is going to be okay with   Depression    DJD (degenerative joint disease), ankle and foot 01/10/2016   Fall 01/20/2017   Fatigue 02/04/2018   Fracture of surgical neck of right humerus 01/20/2017   Hyperlipemia    Mammographic microcalcification found on diagnostic imaging of  breast 08/04/2014   Osteoporosis    Tear of left rotator cuff 07/26/2018   Formatting of this note might be different from the original. 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   Barrett's esophagus without dysplasia 11/25/2018   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HCC) 05/25/2017   Primary osteoarthritis of left foot 12/02/2015   Age-related osteoporosis without current pathological fracture 08/04/2014   Mixed hyperlipidemia 08/04/2014   Aortic stenosis 08/04/2014   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/2014   Primary osteoarthritis of right knee 06/08/2014    PCP: Fanny Bien  REFERRING PROVIDER: Kristine Royal DIAG: bilateral shoulder pain  THERAPY DIAG:  Acute pain of left shoulder  Acute pain of right shoulder  Difficulty in walking, not elsewhere classified  Muscle weakness (generalized)  Repeated falls  Rationale for Evaluation and Treatment Rehabilitation  ONSET DATE: September 2023  SUBJECTIVE:  SUBJECTIVE STATEMENT: I feel like things are improving and I can reach higher and put things in cabinet easier PERTINENT HISTORY: Right shoulder humeral fracture and left shoulder RC repair  PAIN:  Are you having pain? Yes: NPRS scale: 4/10 Pain location: bilateral shoulders  Pain description: sharp, ache Aggravating factors: reaching up, out pain up to 8/10 Relieving factors: rest, OTC pain meds pain at best a 3/10  PRECAUTIONS: None  WEIGHT BEARING  RESTRICTIONS: No  FALLS:  Has patient fallen in last 6 months? No  LIVING ENVIRONMENT: Lives with: lives with their family Lives in: House/apartment Stairs: No Has following equipment at home: None  OCCUPATION: retired  PLOF: Independent and gym 2x/week reports not much walking due to "very poor stamina"  PATIENT GOALS:less pain, stronger, take less pain meds, do hair, reach easier  OBJECTIVE:   DIAGNOSTIC FINDINGS:  None recently  PATIENT SURVEYS:  FOTO 36  COGNITION: Overall cognitive status: Within functional limits for tasks assessed     SENSATION: WFL  POSTURE: Fwd head, rounded shoulders  UPPER EXTREMITY ROM:   Active ROM Right eval Left eval Right AROM 10/02/22 Right AROM 10/31/22  Right AROM 11/14/22  Shoulder flexion 80 100 100 110 115  Shoulder extension       Shoulder abduction 70 60 80 87 90  Shoulder adduction       Shoulder internal rotation 30 10     Shoulder external rotation 40 50 50 55   Elbow flexion       Elbow extension       Wrist flexion       Wrist extension       Wrist ulnar deviation       Wrist radial deviation       Wrist pronation       Wrist supination       (Blank rows = not tested)  UPPER EXTREMITY MMT:  MMT Right eval Left eval  Shoulder flexion 3- 3-  Shoulder extension    Shoulder abduction 3- 3-  Shoulder adduction    Shoulder internal rotation 3+ 3+  Shoulder external rotation 3 3  Middle trapezius    Lower trapezius    Elbow flexion 4- 4-  Elbow extension 4- 4-  Wrist flexion    Wrist extension    Wrist ulnar deviation    Wrist radial deviation    Wrist pronation    Wrist supination    Grip strength (lbs)    (Blank rows = not tested)  SHOULDER SPECIAL TESTS: Impingement tests: Neer impingement test: positive    PALPATION:  Very tight in the upper trap, neck, very tender here and in the upper arms and biceps   TODAY'S TREATMENT:                                                                                                                            DATE: 11/21/22 Nustep level 5 x 6 minutes 10# straight arm pulls  10# AR press 5# hip extension and abduction 2x10 2# wand reaching flexion, extension and IR behind back ER yellow tband Small shoulder abduction yellow tband 90 degree IR and ER with support and cuing 3# 2 sets 10 3# shld flex,scaption and abd to 90 10 each Supine PROM   11/16/22 UBE level 4 x 6 minutes Chest press 5# 2x10 Wand extension 3# Wand IR up back 3# 3# bar reaching up the finger ladder 10# straight arm pulls 1,2 and 3# cabinet reaching flexion and scapular planes Supine 2# ER/IR, 3# punches and isometric circles 1# and 2# flexion in supine Some PROM  11/14/22 Nustep level 5 x 6 minutes Triceps 25# 2x10 5# biceps 10# straight arm pulls Wate bar 2# reaching to finger ladder Ball rolling up wall Serratus roll with 1/2 foam roller Supine PROM Supine 2# ER/IR, punches, isometric circles and flexion  11/02/22 UBE level 4 x 6 minutes 2# and 3# WATE bar reaching up to a point on the finger ladder 2# and 3# bent over row, bent over extension and reverse flies 20# triceps 5# biceps 10# straight arm pulls Supine 3# serratus push and isometric circles Passive stretch to the right shoulder STM to the right rhomboid, levator area  10/31/22 Nustep level 5 x 6 minutes 5# straight arm pulls 2x10, tried 10# but could not do Serratus roll up wall with foam roll Fitter 1 blue band side to side and front to back 15# rows 15# lats 5# chest press AAROM in sitting AROM then with overpressure to her painful end range in supine PROM in supine STM to the right shoulder  10/24/22 Nustep level 5 x 6 minutes 5# straight arm pulls Green tband rows Red tband ER and abduction at sides Supine 2# punches and serratus, flexion and ER/IR 2# cabinet reaching wand extensions Fitter 1 blue band STM and PROM of the right shoulder  10/18/22 Nustep level 5 x 6  minutes Wall slides Fitter 1 blue band side to side and front to back Seated row 15# LAts 15# Wand extensions and IR STM to the right shoulder and neck area PROM right shoulder  PATIENT EDUCATION: Education details: POC Person educated: Patient Education method: Explanation Education comprehension: verbalized understanding  HOME EXERCISE PROGRAM: Doorway stretch   ASSESSMENT:  CLINICAL IMPRESSION: Continued to push ROM, strength and function.  She reports less pain overall but does report that she tried to get on the floor at home and she really struggled to get up and hurt her foot/toes.  She reported that she had vacuumed the house previously and was very tired and should not have tried this.  Mild pain in  the shoulder OBJECTIVE IMPAIRMENTS: decreased balance, decreased endurance, decreased ROM, decreased strength, increased muscle spasms, impaired flexibility, impaired UE functional use, postural dysfunction, and pain.   REHAB POTENTIAL: Good  CLINICAL DECISION MAKING: Stable/uncomplicated  EVALUATION COMPLEXITY: Low   GOALS: Goals reviewed with patient? Yes  SHORT TERM GOALS: Target date: 08/31/22  Independent with initial HEP Goal status: met  LONG TERM GOALS: Target date: 11/09/22  Independent with advanced HEP Goal status: ongoing  2.  Decrease pain overall 50% Goal status: ongoing  3.  Report no difficulty doing hair Goal status: partially met  4.  Report able to cook Thanksgiving dinner without diffiucty Goal status: met  5.  Increase AROM of the shoulders by 20 degrees for all motions Goal status: met  PLAN:  PT FREQUENCY: 1-2x/week  PT DURATION: 12 weeks  PLANNED  INTERVENTIONS: Therapeutic exercises, Therapeutic activity, Neuromuscular re-education, Balance training, Gait training, Patient/Family education, Self Care, Joint mobilization, Joint manipulation, Dry Needling, Electrical stimulation, Cryotherapy, Moist heat, Taping, Vasopneumatic  device, and Manual therapy  PLAN FOR NEXT SESSION:  continue with the functional strength, cabinet reaching, eccentrics  Travarius Lange W, PT 11/21/2022, 11:06 AM

## 2022-11-23 ENCOUNTER — Ambulatory Visit: Payer: Medicare Other | Attending: Sports Medicine | Admitting: Physical Therapy

## 2022-11-23 ENCOUNTER — Encounter: Payer: Self-pay | Admitting: Physical Therapy

## 2022-11-23 DIAGNOSIS — R296 Repeated falls: Secondary | ICD-10-CM | POA: Diagnosis not present

## 2022-11-23 DIAGNOSIS — M6281 Muscle weakness (generalized): Secondary | ICD-10-CM | POA: Diagnosis not present

## 2022-11-23 DIAGNOSIS — M25511 Pain in right shoulder: Secondary | ICD-10-CM

## 2022-11-23 DIAGNOSIS — M25512 Pain in left shoulder: Secondary | ICD-10-CM | POA: Diagnosis not present

## 2022-11-23 DIAGNOSIS — R262 Difficulty in walking, not elsewhere classified: Secondary | ICD-10-CM | POA: Insufficient documentation

## 2022-11-23 NOTE — Therapy (Signed)
OUTPATIENT PHYSICAL THERAPY SHOULDER TREATMENT Progress Note Reporting Period 10/09/22 to 11/23/22 for visit 11-20  See note below for Objective Data and Assessment of Progress/Goals.        Patient Name: Kim Grant MRN: [id number] DOB:1941-08-31, 82 y.o., female Today's Date: 11/23/2022   PT End of Session - 11/23/22 1106     Visit Number 20    Date for PT Re-Evaluation 12/22/22    Authorization Type [employer] medicare    PT Start Time 1100    PT Stop Time 1149    PT Time Calculation (min) 49 min    Activity Tolerance Patient tolerated treatment well    Behavior During Therapy [facility] for tasks assessed/performed             Past Medical History:  Diagnosis Date   Anterior tibialis tendinitis 04/18/2016   Arthritis of knee 10/19/2020   Formatting of this note might be different from the original. Added automatically from request for surgery [id number]   Asymptomatic postprocedural ovarian failure 08/04/2014   Bilateral impacted cerumen 05/13/2020   Last Assessment & Plan:  Formatting of this note might be different from the original. HPI:  Complains of stopped up Bilateral ear(s). EXAM: shows cerumen impaction. PLAN: Cerumen removed with various instruments (curettes, suction) giving subjective relief.  External canals and tympanic membranes are otherwise normal.  Return as needed.   Chronic bronchitis ([place]) 08/04/2014   Closed fracture of nasal bone with routine healing 11/02/2021   Last Assessment & Plan:  Formatting of this note might be different from the original. Nasal fracture. Fell with facial trauma about 3 weeks ago.  CT of the face is reviewed and shows minimally displaced nasal fracture.  Denies any nasal obstruction. EXAM shows mild deviation of the nasal dorsum.  Subtle.  Intranasal exam is unremarkable. PLAN: Reassured I think everything is going to be okay with   Depression    DJD (degenerative joint disease), ankle and foot 01/10/2016   Fall 01/20/2017   Fatigue 02/04/2018    Fracture of surgical neck of right humerus 01/20/2017   Hyperlipemia    Mammographic microcalcification found on diagnostic imaging of breast 08/04/2014   Osteoporosis    Tear of left rotator cuff 07/26/2018   Formatting of this note might be different from the original. 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[place]) 05/25/2017   Primary osteoarthritis of left foot 12/02/2015   Age-related osteoporosis without current pathological fracture 08/04/2014   Mixed hyperlipidemia 08/04/2014   Aortic stenosis 08/04/2014   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/2014   Primary osteoarthritis of right knee 06/08/2014    PCP: Josephine Igo  REFERRING PROVIDER: Leslye Peer DIAG: bilateral shoulder pain  THERAPY DIAG:  Acute pain of left shoulder  Acute pain of right shoulder  Difficulty in walking, not elsewhere classified  Muscle weakness (generalized)  Rationale for Evaluation and Treatment Rehabilitation  ONSET DATE: September 2023  SUBJECTIVE:  SUBJECTIVE STATEMENT: I hurt when I reach up and try to put dishes up in the cabinet, it hurts but I also feel weak PERTINENT HISTORY: Right shoulder humeral fracture and left shoulder RC repair  PAIN:  Are you having pain? Yes: NPRS scale: 4/10 Pain location: bilateral shoulders  Pain description: sharp, ache Aggravating factors: reaching up, out  pain up to 6/10 Relieving factors: rest, OTC pain meds pain at best a 3/10  PRECAUTIONS: None  WEIGHT BEARING RESTRICTIONS: No  FALLS:  Has patient fallen in last 6 months? No  LIVING ENVIRONMENT: Lives with: lives with their family Lives in: House/apartment Stairs: No Has following equipment at home: None  OCCUPATION: retired  PLOF: Independent and gym 2x/week reports not much walking due to "very poor stamina"  PATIENT GOALS:less pain, stronger, take less pain meds, do hair, reach easier  OBJECTIVE:   DIAGNOSTIC FINDINGS:  None recently  PATIENT SURVEYS:  FOTO 36  COGNITION: Overall cognitive status: Within functional limits for tasks assessed     SENSATION: WFL  POSTURE: Fwd head, rounded shoulders  UPPER EXTREMITY ROM:   Active ROM Right eval Left eval Right AROM 10/02/22 Right AROM 10/31/22  Right AROM 11/14/22  Shoulder flexion 80 100 100 110 115  Shoulder extension       Shoulder abduction 70 60 80 87 90  Shoulder adduction       Shoulder internal rotation 30 10     Shoulder external rotation 40 50 50 55   Elbow flexion       Elbow extension       Wrist flexion       Wrist extension       Wrist ulnar deviation       Wrist radial deviation       Wrist pronation       Wrist supination       (Blank rows = not tested)  UPPER EXTREMITY MMT:  MMT Right eval Left eval  Shoulder flexion 3- 3-  Shoulder extension    Shoulder abduction 3- 3-  Shoulder adduction    Shoulder internal rotation 3+ 3+  Shoulder external rotation 3 3  Middle trapezius    Lower trapezius    Elbow flexion 4- 4-  Elbow extension 4- 4-  Wrist flexion    Wrist extension    Wrist ulnar deviation    Wrist radial deviation    Wrist pronation    Wrist supination    Grip strength (lbs)    (Blank rows = not tested)  SHOULDER SPECIAL TESTS: Impingement tests: Neer impingement test: positive    PALPATION:  Very tight in the upper trap, neck, very tender here and in  the upper arms and biceps   TODAY'S TREATMENT:                                                                                                                           DATE: 11/23/22 UBE level 4 x 6  mintues 2# wate bar reaching up the finger ladder and eccentric down Wall slides with 1#, circles and scaption Ball throwing Ball pass around waist 2# wate bar extension and then IR 10# chest press x 5, then 5# x 8 Yellow tband ER Yellow tband abduction arms at side Supine 2# ER/IR Supine 2# flexion 3# punches and isometric circles STM to the right rhomboid and upper traps  11/21/22 Nustep level 5 x 6 minutes 10# straight arm pulls 10# AR press 5# hip extension and abduction 2x10 2# wand reaching flexion, extension and IR behind back ER yellow tband Small shoulder abduction yellow tband 90 degree IR and ER with support and cuing 3# 2 sets 10 3# shld flex,scaption and abd to 90 10 each Supine PROM   11/16/22 UBE level 4 x 6 minutes Chest press 5# 2x10 Wand extension 3# Wand IR up back 3# 3# bar reaching up the finger ladder 10# straight arm pulls 1,2 and 3# cabinet reaching flexion and scapular planes Supine 2# ER/IR, 3# punches and isometric circles 1# and 2# flexion in supine Some PROM  11/14/22 Nustep level 5 x 6 minutes Triceps 25# 2x10 5# biceps 10# straight arm pulls Wate bar 2# reaching to finger ladder Ball rolling up wall Serratus roll with 1/2 foam roller Supine PROM Supine 2# ER/IR, punches, isometric circles and flexion  11/02/22 UBE level 4 x 6 minutes 2# and 3# WATE bar reaching up to a point on the finger ladder 2# and 3# bent over row, bent over extension and reverse flies 20# triceps 5# biceps 10# straight arm pulls Supine 3# serratus push and isometric circles Passive stretch to the right shoulder STM to the right rhomboid, levator area  10/31/22 Nustep level 5 x 6 minutes 5# straight arm pulls 2x10, tried 10# but could not do Serratus roll up  wall with foam roll Fitter 1 blue band side to side and front to back 15# rows 15# lats 5# chest press AAROM in sitting AROM then with overpressure to her painful end range in supine PROM in supine STM to the right shoulder  PATIENT EDUCATION: Education details: POC Person educated: Patient Education method: Explanation Education comprehension: verbalized understanding  HOME EXERCISE PROGRAM: Doorway stretch   ASSESSMENT:  CLINICAL IMPRESSION: Patient with some knots and tenderness int he right upper trap and the rhomboid, we worked on this today and tried to continue to progress her strength and function OBJECTIVE IMPAIRMENTS: decreased balance, decreased endurance, decreased ROM, decreased strength, increased muscle spasms, impaired flexibility, impaired UE functional use, postural dysfunction, and pain.   REHAB POTENTIAL: Good  CLINICAL DECISION MAKING: Stable/uncomplicated  EVALUATION COMPLEXITY: Low   GOALS: Goals reviewed with patient? Yes  SHORT TERM GOALS: Target date: 08/31/22  Independent with initial HEP Goal status: met  LONG TERM GOALS: Target date: 11/09/22  Independent with advanced HEP Goal status: progressing  2.  Decrease pain overall 50% Goal status: progressing  3.  Report no difficulty doing hair Goal status: partially met  4.  Report able to cook Thanksgiving dinner without diffiucty Goal status: met  5.  Increase AROM of the shoulders by 20 degrees for all motions Goal status: met  PLAN:  PT FREQUENCY: 1-2x/week  PT DURATION: 12 weeks  PLANNED INTERVENTIONS: Therapeutic exercises, Therapeutic activity, Neuromuscular re-education, Balance training, Gait training, Patient/Family education, Self Care, Joint mobilization, Joint manipulation, Dry Needling, Electrical stimulation, Cryotherapy, Moist heat, Taping, Vasopneumatic device, and Manual therapy  PLAN FOR NEXT SESSION:  continue with the  functional strength, cabinet reaching,  eccentrics  Carmine Youngberg W, PT 11/23/2022, 11:07 AM

## 2022-11-24 ENCOUNTER — Inpatient Hospital Stay: Payer: Medicare Other | Attending: Hematology & Oncology

## 2022-11-24 ENCOUNTER — Inpatient Hospital Stay: Payer: Medicare Other

## 2022-11-24 VITALS — BP 114/69 | HR 70 | Temp 98.0°F | Resp 17

## 2022-11-24 DIAGNOSIS — D631 Anemia in chronic kidney disease: Secondary | ICD-10-CM | POA: Diagnosis not present

## 2022-11-24 DIAGNOSIS — N189 Chronic kidney disease, unspecified: Secondary | ICD-10-CM | POA: Diagnosis not present

## 2022-11-24 DIAGNOSIS — D509 Iron deficiency anemia, unspecified: Secondary | ICD-10-CM

## 2022-11-24 LAB — CMP (CANCER CENTER ONLY)
ALT: 21 U/L (ref 0–44)
AST: 25 U/L (ref 15–41)
Albumin: 4.3 g/dL (ref 3.5–5.0)
Alkaline Phosphatase: 58 U/L (ref 38–126)
Anion gap: 9 (ref 5–15)
BUN: 22 mg/dL (ref 8–23)
CO2: 28 mmol/L (ref 22–32)
Calcium: 10.2 mg/dL (ref 8.9–10.3)
Chloride: 98 mmol/L (ref 98–111)
Creatinine: 1.22 mg/dL — ABNORMAL HIGH (ref 0.44–1.00)
GFR, Estimated: 45 mL/min — ABNORMAL LOW (ref >60)
Glucose, Bld: 88 mg/dL (ref 70–99)
Potassium: 4.7 mmol/L (ref 3.5–5.1)
Sodium: 135 mmol/L (ref 135–145)
Total Bilirubin: 0.4 mg/dL (ref 0.3–1.2)
Total Protein: 7.4 g/dL (ref 6.5–8.1)

## 2022-11-24 LAB — CBC WITH DIFFERENTIAL (CANCER CENTER ONLY)
Abs Immature Granulocytes: 0.04 10*3/uL (ref 0.00–0.07)
Basophils Absolute: 0.1 10*3/uL (ref 0.0–0.1)
Basophils Relative: 1 %
Eosinophils Absolute: 0.1 10*3/uL (ref 0.0–0.5)
Eosinophils Relative: 2 %
HCT: 34.2 % — ABNORMAL LOW (ref 36.0–46.0)
Hemoglobin: 10.8 g/dL — ABNORMAL LOW (ref 12.0–15.0)
Immature Granulocytes: 1 %
Lymphocytes Relative: 22 %
Lymphs Abs: 1.6 10*3/uL (ref 0.7–4.0)
MCH: 26.7 pg (ref 26.0–34.0)
MCHC: 31.6 g/dL (ref 30.0–36.0)
MCV: 84.7 fL (ref 80.0–100.0)
Monocytes Absolute: 0.7 10*3/uL (ref 0.1–1.0)
Monocytes Relative: 9 %
Neutro Abs: 5 10*3/uL (ref 1.7–7.7)
Neutrophils Relative %: 65 %
Platelet Count: 346 10*3/uL (ref 150–400)
RBC: 4.04 MIL/uL (ref 3.87–5.11)
RDW: 13.7 % (ref 11.5–15.5)
WBC Count: 7.5 10*3/uL (ref 4.0–10.5)
nRBC: 0 % (ref 0.0–0.2)

## 2022-11-24 MED ORDER — EPOETIN ALFA-EPBX 40000 UNIT/ML IJ SOLN
40000.0000 [IU] | Freq: Once | INTRAMUSCULAR | Status: AC
  Administered 2022-11-24: 40000 [IU] via SUBCUTANEOUS
  Filled 2022-11-24: qty 1

## 2022-11-24 NOTE — Patient Instructions (Signed)

## 2022-11-25 ENCOUNTER — Encounter: Payer: Self-pay | Admitting: Family

## 2022-11-28 ENCOUNTER — Ambulatory Visit: Payer: Medicare Other | Admitting: Physical Therapy

## 2022-11-30 ENCOUNTER — Ambulatory Visit: Payer: Medicare Other | Admitting: Physical Therapy

## 2022-12-01 ENCOUNTER — Ambulatory Visit: Payer: Medicare Other | Admitting: Physical Therapy

## 2022-12-01 ENCOUNTER — Encounter: Payer: Self-pay | Admitting: Physical Therapy

## 2022-12-01 DIAGNOSIS — R262 Difficulty in walking, not elsewhere classified: Secondary | ICD-10-CM

## 2022-12-01 DIAGNOSIS — R296 Repeated falls: Secondary | ICD-10-CM | POA: Diagnosis not present

## 2022-12-01 DIAGNOSIS — M6281 Muscle weakness (generalized): Secondary | ICD-10-CM | POA: Diagnosis not present

## 2022-12-01 DIAGNOSIS — M25512 Pain in left shoulder: Secondary | ICD-10-CM | POA: Diagnosis not present

## 2022-12-01 DIAGNOSIS — M25511 Pain in right shoulder: Secondary | ICD-10-CM

## 2022-12-01 NOTE — Therapy (Signed)
OUTPATIENT PHYSICAL THERAPY SHOULDER TREATMENT      Patient Name: Kim Grant MRN: [id number] DOB:07-18-1941, 82 y.o., female Today's Date: 12/01/2022   PT End of Session - 12/01/22 0934     Visit Number 21    Date for PT Re-Evaluation 12/22/22    Authorization Type [employer] medicare    PT Start Time [phone]    PT Stop Time 1015    PT Time Calculation (min) 46 min    Activity Tolerance Patient tolerated treatment well    Behavior During Therapy [facility] for tasks assessed/performed             Past Medical History:  Diagnosis Date   Anterior tibialis tendinitis 04/18/2016   Arthritis of knee 10/19/2020   Formatting of this note might be different from the original. Added automatically from request for surgery [id number]   Asymptomatic postprocedural ovarian failure 08/04/2014   Bilateral impacted cerumen 05/13/2020   Last Assessment & Plan:  Formatting of this note might be different from the original. HPI:  Complains of stopped up Bilateral ear(s). EXAM: shows cerumen impaction. PLAN: Cerumen removed with various instruments (curettes, suction) giving subjective relief.  External canals and tympanic membranes are otherwise normal.  Return as needed.   Chronic bronchitis ([place]) 08/04/2014   Closed fracture of nasal bone with routine healing 11/02/2021   Last Assessment & Plan:  Formatting of this note might be different from the original. Nasal fracture. Fell with facial trauma about 3 weeks ago.  CT of the face is reviewed and shows minimally displaced nasal fracture.  Denies any nasal obstruction. EXAM shows mild deviation of the nasal dorsum.  Subtle.  Intranasal exam is unremarkable. PLAN: Reassured I think everything is going to be okay with   Depression    DJD (degenerative joint disease), ankle and foot 01/10/2016   Fall 01/20/2017   Fatigue 02/04/2018   Fracture of surgical neck of right humerus 01/20/2017   Hyperlipemia    Mammographic microcalcification found on diagnostic imaging of  breast 08/04/2014   Osteoporosis    Tear of left rotator cuff 07/26/2018   Formatting of this note might be different from the original. 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[place]) 05/25/2017   Primary osteoarthritis of left foot 12/02/2015   Age-related osteoporosis without current pathological fracture 08/04/2014   Mixed hyperlipidemia 08/04/2014   Aortic stenosis 08/04/2014   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/2014   Primary osteoarthritis of right knee 06/08/2014    PCP: Josephine Igo  REFERRING PROVIDER: Leslye Peer DIAG: bilateral shoulder pain  THERAPY DIAG:  Acute pain of left shoulder  Acute pain of right shoulder  Difficulty in walking, not elsewhere classified  Muscle weakness (generalized)  Rationale for Evaluation and Treatment Rehabilitation  ONSET DATE: September 2023  SUBJECTIVE:  SUBJECTIVE STATEMENT: Reports that she put out some pine needles, she reports that she is sore in the shoulders and the upper arm PERTINENT HISTORY: Right shoulder humeral fracture and left shoulder RC repair  PAIN:  Are you having pain? Yes: NPRS scale: 4/10 Pain location: bilateral shoulders  Pain description: sharp, ache Aggravating factors: reaching up, out pain up to 6/10 Relieving factors: rest, OTC pain meds pain at best a 3/10  PRECAUTIONS: None  WEIGHT BEARING  RESTRICTIONS: No  FALLS:  Has patient fallen in last 6 months? No  LIVING ENVIRONMENT: Lives with: lives with their family Lives in: House/apartment Stairs: No Has following equipment at home: None  OCCUPATION: retired  PLOF: Independent and gym 2x/week reports not much walking due to "very poor stamina"  PATIENT GOALS:less pain, stronger, take less pain meds, do hair, reach easier  OBJECTIVE:   DIAGNOSTIC FINDINGS:  None recently  PATIENT SURVEYS:  FOTO 36  COGNITION: Overall cognitive status: Within functional limits for tasks assessed     SENSATION: WFL  POSTURE: Fwd head, rounded shoulders  UPPER EXTREMITY ROM:   Active ROM Right eval Left eval Right AROM 10/02/22 Right AROM 10/31/22  Right AROM 11/14/22 Right AROM  12/01/22  Shoulder flexion 80 100 100 110 115 120  Shoulder extension        Shoulder abduction 70 60 80 87 90   Shoulder adduction        Shoulder internal rotation 30 10      Shoulder external rotation 40 50 50 55    Elbow flexion        Elbow extension        Wrist flexion        Wrist extension        Wrist ulnar deviation        Wrist radial deviation        Wrist pronation        Wrist supination        (Blank rows = not tested)  UPPER EXTREMITY MMT:  MMT Right eval Left eval  Shoulder flexion 3- 3-  Shoulder extension    Shoulder abduction 3- 3-  Shoulder adduction    Shoulder internal rotation 3+ 3+  Shoulder external rotation 3 3  Middle trapezius    Lower trapezius    Elbow flexion 4- 4-  Elbow extension 4- 4-  Wrist flexion    Wrist extension    Wrist ulnar deviation    Wrist radial deviation    Wrist pronation    Wrist supination    Grip strength (lbs)    (Blank rows = not tested)  SHOULDER SPECIAL TESTS: Impingement tests: Neer impingement test: positive    PALPATION:  Very tight in the upper trap, neck, very tender here and in the upper arms and biceps   TODAY'S TREATMENT:  DATE: 12/01/22 UBE level 4 x 6 mintues 2# wate bar up the wall flexion, extension and IR Side step on and off airex On airex ball toss, head turns, eyes closed Ball vs wall PROM of the right shoulder Measured flexion actively to 120 degrees STM to the right deltoid and the upper trap Asked her to add AAROM with both hands in sitting, reclined and in supine  11/23/22 UBE level 4 x 6 mintues 2# wate bar reaching up the finger ladder and eccentric down Wall slides with 1#, circles and scaption Ball throwing Ball pass around waist 2# wate bar extension and then IR 10# chest press x 5, then 5# x 8 Yellow tband ER Yellow tband abduction arms at side Supine 2# ER/IR Supine 2# flexion 3# punches and isometric circles STM to the right rhomboid and upper traps  11/21/22 Nustep level 5 x 6 minutes 10# straight arm pulls 10# AR press 5# hip extension and abduction 2x10 2# wand reaching flexion, extension and IR behind back ER yellow tband Small shoulder abduction yellow tband 90 degree IR and ER with support and cuing 3# 2 sets 10 3# shld flex,scaption and abd to 90 10 each Supine PROM   11/16/22 UBE level 4 x 6 minutes Chest press 5# 2x10 Wand extension 3# Wand IR up back 3# 3# bar reaching up the finger ladder 10# straight arm pulls 1,2 and 3# cabinet reaching flexion and scapular planes Supine 2# ER/IR, 3# punches and isometric circles 1# and 2# flexion in supine Some PROM  11/14/22 Nustep level 5 x 6 minutes Triceps 25# 2x10 5# biceps 10# straight arm pulls Wate bar 2# reaching to finger ladder Ball rolling up wall Serratus roll with 1/2 foam roller Supine PROM Supine 2# ER/IR, punches, isometric circles and flexion  11/02/22 UBE level 4 x 6 minutes 2# and 3# WATE bar reaching up to a point on the finger ladder 2# and 3# bent over row, bent over extension and reverse flies 20#  triceps 5# biceps 10# straight arm pulls Supine 3# serratus push and isometric circles Passive stretch to the right shoulder STM to the right rhomboid, levator area  PATIENT EDUCATION: Education details: POC Person educated: Patient Education method: Explanation Education comprehension: verbalized understanding  HOME EXERCISE PROGRAM: Doorway stretch   ASSESSMENT:  CLINICAL IMPRESSION: Patient with some knots and tenderness int he right upper trap and the rhomboid and deltoid we worked on this today and tried to continue to progress her strength and function, she was a little tired and had a cough today so we did more in the room with her masked and she had some difficulty breathing.  She is continuing to gain AROM  OBJECTIVE IMPAIRMENTS: decreased balance, decreased endurance, decreased ROM, decreased strength, increased muscle spasms, impaired flexibility, impaired UE functional use, postural dysfunction, and pain.   REHAB POTENTIAL: Good  CLINICAL DECISION MAKING: Stable/uncomplicated  EVALUATION COMPLEXITY: Low   GOALS: Goals reviewed with patient? Yes  SHORT TERM GOALS: Target date: 08/31/22  Independent with initial HEP Goal status: met  LONG TERM GOALS: Target date: 11/09/22  Independent with advanced HEP Goal status: progressing  2.  Decrease pain overall 50% Goal status: progressing  3.  Report no difficulty doing hair Goal status: partially met  4.  Report able to cook Thanksgiving dinner without diffiucty Goal status: met  5.  Increase AROM of the shoulders by 20 degrees for all motions Goal status: met  PLAN:  PT FREQUENCY: 1-2x/week  PT  DURATION: 12 weeks  PLANNED INTERVENTIONS: Therapeutic exercises, Therapeutic activity, Neuromuscular re-education, Balance training, Gait training, Patient/Family education, Self Care, Joint mobilization, Joint manipulation, Dry Needling, Electrical stimulation, Cryotherapy, Moist heat, Taping, Vasopneumatic  device, and Manual therapy  PLAN FOR NEXT SESSION:  continue with the functional strength, cabinet reaching, eccentrics  Shardea Cwynar W, PT 12/01/2022, 9:34 AM

## 2022-12-05 ENCOUNTER — Encounter: Payer: Self-pay | Admitting: Physical Therapy

## 2022-12-05 ENCOUNTER — Ambulatory Visit: Payer: Medicare Other | Admitting: Physical Therapy

## 2022-12-05 DIAGNOSIS — M25511 Pain in right shoulder: Secondary | ICD-10-CM

## 2022-12-05 DIAGNOSIS — R296 Repeated falls: Secondary | ICD-10-CM

## 2022-12-05 DIAGNOSIS — M6281 Muscle weakness (generalized): Secondary | ICD-10-CM

## 2022-12-05 DIAGNOSIS — R262 Difficulty in walking, not elsewhere classified: Secondary | ICD-10-CM | POA: Diagnosis not present

## 2022-12-05 DIAGNOSIS — M25512 Pain in left shoulder: Secondary | ICD-10-CM | POA: Diagnosis not present

## 2022-12-05 NOTE — Therapy (Signed)
OUTPATIENT PHYSICAL THERAPY SHOULDER TREATMENT      Patient Name: Kim Grant MRN: [id number] DOB:03/01/1941, 82 y.o., female Today's Date: 12/05/2022   PT End of Session - 12/05/22 1449     Visit Number 22    Date for PT Re-Evaluation 12/22/22    Authorization Type [employer] medicare    PT Start Time 1445    PT Stop Time 1530    PT Time Calculation (min) 45 min    Activity Tolerance Patient tolerated treatment well    Behavior During Therapy [facility] for tasks assessed/performed             Past Medical History:  Diagnosis Date   Anterior tibialis tendinitis 04/18/2016   Arthritis of knee 10/19/2020   Formatting of this note might be different from the original. Added automatically from request for surgery [id number]   Asymptomatic postprocedural ovarian failure 08/04/2014   Bilateral impacted cerumen 05/13/2020   Last Assessment & Plan:  Formatting of this note might be different from the original. HPI:  Complains of stopped up Bilateral ear(s). EXAM: shows cerumen impaction. PLAN: Cerumen removed with various instruments (curettes, suction) giving subjective relief.  External canals and tympanic membranes are otherwise normal.  Return as needed.   Chronic bronchitis ([place]) 08/04/2014   Closed fracture of nasal bone with routine healing 11/02/2021   Last Assessment & Plan:  Formatting of this note might be different from the original. Nasal fracture. Fell with facial trauma about 3 weeks ago.  CT of the face is reviewed and shows minimally displaced nasal fracture.  Denies any nasal obstruction. EXAM shows mild deviation of the nasal dorsum.  Subtle.  Intranasal exam is unremarkable. PLAN: Reassured I think everything is going to be okay with   Depression    DJD (degenerative joint disease), ankle and foot 01/10/2016   Fall 01/20/2017   Fatigue 02/04/2018   Fracture of surgical neck of right humerus 01/20/2017   Hyperlipemia    Mammographic microcalcification found on diagnostic imaging of  breast 08/04/2014   Osteoporosis    Tear of left rotator cuff 07/26/2018   Formatting of this note might be different from the original. 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[place]) 05/25/2017   Primary osteoarthritis of left foot 12/02/2015   Age-related osteoporosis without current pathological fracture 08/04/2014   Mixed hyperlipidemia 08/04/2014   Aortic stenosis 08/04/2014   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/2014   Primary osteoarthritis of right knee 06/08/2014    PCP: Josephine Igo  REFERRING PROVIDER: Leslye Peer DIAG: bilateral shoulder pain  THERAPY DIAG:  Acute pain of left shoulder  Acute pain of right shoulder  Difficulty in walking, not elsewhere classified  Muscle weakness (generalized)  Repeated falls  Rationale for Evaluation and Treatment Rehabilitation  ONSET DATE: September 2023  SUBJECTIVE:  SUBJECTIVE STATEMENT: Reports that she is doing better and has been able to clean around the house more wihtout increase of shoulder pain PERTINENT HISTORY: Right shoulder humeral fracture and left shoulder RC repair  PAIN:  Are you having pain? Yes: NPRS scale: 4/10 Pain location: bilateral shoulders  Pain description: sharp, ache Aggravating factors: reaching up, out pain up to 6/10 Relieving factors: rest, OTC pain meds pain at best a 3/10  PRECAUTIONS:  None  WEIGHT BEARING RESTRICTIONS: No  FALLS:  Has patient fallen in last 6 months? No  LIVING ENVIRONMENT: Lives with: lives with their family Lives in: House/apartment Stairs: No Has following equipment at home: None  OCCUPATION: retired  PLOF: Independent and gym 2x/week reports not much walking due to "very poor stamina"  PATIENT GOALS:less pain, stronger, take less pain meds, do hair, reach easier  OBJECTIVE:   DIAGNOSTIC FINDINGS:  None recently  PATIENT SURVEYS:  FOTO 36  COGNITION: Overall cognitive status: Within functional limits for tasks assessed     SENSATION: WFL  POSTURE: Fwd head, rounded shoulders  UPPER EXTREMITY ROM:   Active ROM Right eval Left eval Right AROM 10/02/22 Right AROM 10/31/22  Right AROM 11/14/22 Right AROM  12/01/22  Shoulder flexion 80 100 100 110 115 120  Shoulder extension        Shoulder abduction 70 60 80 87 90   Shoulder adduction        Shoulder internal rotation 30 10      Shoulder external rotation 40 50 50 55    Elbow flexion        Elbow extension        Wrist flexion        Wrist extension        Wrist ulnar deviation        Wrist radial deviation        Wrist pronation        Wrist supination        (Blank rows = not tested)  UPPER EXTREMITY MMT:  MMT Right eval Left eval  Shoulder flexion 3- 3-  Shoulder extension    Shoulder abduction 3- 3-  Shoulder adduction    Shoulder internal rotation 3+ 3+  Shoulder external rotation 3 3  Middle trapezius    Lower trapezius    Elbow flexion 4- 4-  Elbow extension 4- 4-  Wrist flexion    Wrist extension    Wrist ulnar deviation    Wrist radial deviation    Wrist pronation    Wrist supination    Grip strength (lbs)    (Blank rows = not tested)  SHOULDER SPECIAL TESTS: Impingement tests: Neer impingement test: positive    PALPATION:  Very tight in the upper trap, neck, very tender here and in the upper arms and biceps   TODAY'S TREATMENT:  DATE: 12/05/22 Nustep level 5 x 6 minutes Resisted gait all directions  Seated row 15# 2x10 Lats 15# 2x10 Chest press 5# x10, 10# x8 5# hip abduction and extension Passive stretch to the Shoulder right to end ranges  12/01/22 UBE level 4 x 6 mintues 2# wate bar up the wall flexion, extension and IR Side step on and off airex On airex ball toss, head turns, eyes closed Ball vs wall PROM of the right shoulder Measured flexion actively to 120 degrees STM to the right deltoid and the upper trap Asked her to add AAROM with both hands in sitting, reclined and in supine  11/23/22 UBE level 4 x 6 mintues 2# wate bar reaching up the finger ladder and eccentric down Wall slides with 1#, circles and scaption Ball throwing Ball pass around waist 2# wate bar extension and then IR 10# chest press x 5, then 5# x 8 Yellow tband ER Yellow tband abduction arms at side Supine 2# ER/IR Supine 2# flexion 3# punches and isometric circles STM to the right rhomboid and upper traps  11/21/22 Nustep level 5 x 6 minutes 10# straight arm pulls 10# AR press 5# hip extension and abduction 2x10 2# wand reaching flexion, extension and IR behind back ER yellow tband Small shoulder abduction yellow tband 90 degree IR and ER with support and cuing 3# 2 sets 10 3# shld flex,scaption and abd to 90 10 each Supine PROM   11/16/22 UBE level 4 x 6 minutes Chest press 5# 2x10 Wand extension 3# Wand IR up back 3# 3# bar reaching up the finger ladder 10# straight arm pulls 1,2 and 3# cabinet reaching flexion and scapular planes Supine 2# ER/IR, 3# punches and isometric circles 1# and 2# flexion in supine Some PROM  PATIENT EDUCATION: Education details: POC Person educated: Patient Education method: Explanation Education comprehension: verbalized understanding  HOME EXERCISE  PROGRAM: Doorway stretch   ASSESSMENT:  CLINICAL IMPRESSION: Patient reports that she is doing well, less pain and she reports that she has been able to clean more, still reports less strength in the LE's.  I added some increased strength today and she did well struggled with hip abduction   OBJECTIVE IMPAIRMENTS: decreased balance, decreased endurance, decreased ROM, decreased strength, increased muscle spasms, impaired flexibility, impaired UE functional use, postural dysfunction, and pain.   REHAB POTENTIAL: Good  CLINICAL DECISION MAKING: Stable/uncomplicated  EVALUATION COMPLEXITY: Low   GOALS: Goals reviewed with patient? Yes  SHORT TERM GOALS: Target date: 08/31/22  Independent with initial HEP Goal status: met  LONG TERM GOALS: Target date: 11/09/22  Independent with advanced HEP Goal status: progressing  2.  Decrease pain overall 50% Goal status: progressing  3.  Report no difficulty doing hair Goal status: partially met  4.  Report able to cook Thanksgiving dinner without diffiucty Goal status: met  5.  Increase AROM of the shoulders by 20 degrees for all motions Goal status: met  PLAN:  PT FREQUENCY: 1-2x/week  PT DURATION: 12 weeks  PLANNED INTERVENTIONS: Therapeutic exercises, Therapeutic activity, Neuromuscular re-education, Balance training, Gait training, Patient/Family education, Self Care, Joint mobilization, Joint manipulation, Dry Needling, Electrical stimulation, Cryotherapy, Moist heat, Taping, Vasopneumatic device, and Manual therapy  PLAN FOR NEXT SESSION:  continue with the functional strength, cabinet reaching, eccentrics  Jagger Beahm W, PT 12/05/2022, 2:50 PM

## 2022-12-12 ENCOUNTER — Encounter: Payer: Self-pay | Admitting: Physical Therapy

## 2022-12-12 ENCOUNTER — Ambulatory Visit: Payer: Medicare Other | Admitting: Physical Therapy

## 2022-12-12 DIAGNOSIS — M6281 Muscle weakness (generalized): Secondary | ICD-10-CM

## 2022-12-12 DIAGNOSIS — M25512 Pain in left shoulder: Secondary | ICD-10-CM

## 2022-12-12 DIAGNOSIS — R296 Repeated falls: Secondary | ICD-10-CM | POA: Diagnosis not present

## 2022-12-12 DIAGNOSIS — M25511 Pain in right shoulder: Secondary | ICD-10-CM | POA: Diagnosis not present

## 2022-12-12 DIAGNOSIS — R262 Difficulty in walking, not elsewhere classified: Secondary | ICD-10-CM

## 2022-12-12 NOTE — Therapy (Signed)
OUTPATIENT PHYSICAL THERAPY SHOULDER TREATMENT      Patient Name: Kim Grant MRN: [id number] DOB:1941-09-22, 82 y.o., female Today's Date: 12/12/2022   PT End of Session - 12/12/22 1448     Visit Number 23    Date for PT Re-Evaluation 12/22/22    Authorization Type BCBS medicare    PT Start Time 1445    PT Stop Time 1530    PT Time Calculation (min) 45 min    Activity Tolerance Patient tolerated treatment well    Behavior During Therapy [facility] for tasks assessed/performed             Past Medical History:  Diagnosis Date   Anterior tibialis tendinitis 04/18/2016   Arthritis of knee 10/19/2020   Formatting of this note might be different from the original. Added automatically from request for surgery [id number]   Asymptomatic postprocedural ovarian failure 08/04/2014   Bilateral impacted cerumen 05/13/2020   Last Assessment & Plan:  Formatting of this note might be different from the original. HPI:  Complains of stopped up Bilateral ear(s). EXAM: shows cerumen impaction. PLAN: Cerumen removed with various instruments (curettes, suction) giving subjective relief.  External canals and tympanic membranes are otherwise normal.  Return as needed.   Chronic bronchitis ([place]) 08/04/2014   Closed fracture of nasal bone with routine healing 11/02/2021   Last Assessment & Plan:  Formatting of this note might be different from the original. Nasal fracture. Fell with facial trauma about 3 weeks ago.  CT of the face is reviewed and shows minimally displaced nasal fracture.  Denies any nasal obstruction. EXAM shows mild deviation of the nasal dorsum.  Subtle.  Intranasal exam is unremarkable. PLAN: Reassured I think everything is going to be okay with   Depression    DJD (degenerative joint disease), ankle and foot 01/10/2016   Fall 01/20/2017   Fatigue 02/04/2018   Fracture of surgical neck of right humerus 01/20/2017   Hyperlipemia    Mammographic microcalcification found on diagnostic imaging of  breast 08/04/2014   Osteoporosis    Tear of left rotator cuff 07/26/2018   Formatting of this note might be different from the original. 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[place]) 05/25/2017   Primary osteoarthritis of left foot 12/02/2015   Age-related osteoporosis without current pathological fracture 08/04/2014   Mixed hyperlipidemia 08/04/2014   Aortic stenosis 08/04/2014   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/2014   Primary osteoarthritis of right knee 06/08/2014    PCP: Josephine Igo  REFERRING PROVIDER: Leslye Peer DIAG: bilateral shoulder pain  THERAPY DIAG:  Acute pain of left shoulder  Acute pain of right shoulder  Difficulty in walking, not elsewhere classified  Muscle weakness (generalized)  Repeated falls  Rationale for Evaluation and Treatment Rehabilitation  ONSET DATE: September 2023  SUBJECTIVE:  SUBJECTIVE STATEMENT: Reports that she is doing better and has been able to clean around the house more wihtout increase of shoulder pain, she reports that she is doing things much easier PERTINENT HISTORY: Right shoulder humeral fracture and left shoulder RC repair  PAIN:  Are you having pain? Yes: NPRS scale: 3/10 Pain location: bilateral shoulders  Pain description: sharp, ache Aggravating factors: reaching up, out pain up to 6/10 Relieving factors: rest, OTC  pain meds pain at best a 3/10  PRECAUTIONS: None  WEIGHT BEARING RESTRICTIONS: No  FALLS:  Has patient fallen in last 6 months? No  LIVING ENVIRONMENT: Lives with: lives with their family Lives in: House/apartment Stairs: No Has following equipment at home: None  OCCUPATION: retired  PLOF: Independent and gym 2x/week reports not much walking due to "very poor stamina"  PATIENT GOALS:less pain, stronger, take less pain meds, do hair, reach easier  OBJECTIVE:   DIAGNOSTIC FINDINGS:  None recently  PATIENT SURVEYS:  FOTO 36  COGNITION: Overall cognitive status: Within functional limits for tasks assessed     SENSATION: WFL  POSTURE: Fwd head, rounded shoulders  UPPER EXTREMITY ROM:   Active ROM Right eval Left eval Right AROM 10/02/22 Right AROM 10/31/22  Right AROM 11/14/22 Right AROM  12/01/22  Shoulder flexion 80 100 100 110 115 120  Shoulder extension        Shoulder abduction 70 60 80 87 90   Shoulder adduction        Shoulder internal rotation 30 10      Shoulder external rotation 40 50 50 55    Elbow flexion        Elbow extension        Wrist flexion        Wrist extension        Wrist ulnar deviation        Wrist radial deviation        Wrist pronation        Wrist supination        (Blank rows = not tested)  UPPER EXTREMITY MMT:  MMT Right eval Left eval  Shoulder flexion 3- 3-  Shoulder extension    Shoulder abduction 3- 3-  Shoulder adduction    Shoulder internal rotation 3+ 3+  Shoulder external rotation 3 3  Middle trapezius    Lower trapezius    Elbow flexion 4- 4-  Elbow extension 4- 4-  Wrist flexion    Wrist extension    Wrist ulnar deviation    Wrist radial deviation    Wrist pronation    Wrist supination    Grip strength (lbs)    (Blank rows = not tested)  SHOULDER SPECIAL TESTS: Impingement tests: Neer impingement test: positive    PALPATION:  Very tight in the upper trap, neck, very tender here and in the upper  arms and biceps   TODAY'S TREATMENT:  DATE: 12/12/22 UBE level 4 x 5 minutes Airex balance beam tandem gait and side stepping Cone toe touches 5# hip abduction, flexion and extension 2# UE circuit 2 rounds 2 and 3# cabinet reaching Supine 3# isometric circles, small arc flexion, ER/IR Ball toss 3# extension, flexion with the bar  12/05/22 Nustep level 5 x 6 minutes Resisted gait all directions  Seated row 15# 2x10 Lats 15# 2x10 Chest press 5# x10, 10# x8 5# hip abduction and extension Passive stretch to the Shoulder right to end ranges  12/01/22 UBE level 4 x 6 mintues 2# wate bar up the wall flexion, extension and IR Side step on and off airex On airex ball toss, head turns, eyes closed Ball vs wall PROM of the right shoulder Measured flexion actively to 120 degrees STM to the right deltoid and the upper trap Asked her to add AAROM with both hands in sitting, reclined and in supine  11/23/22 UBE level 4 x 6 mintues 2# wate bar reaching up the finger ladder and eccentric down Wall slides with 1#, circles and scaption Ball throwing Ball pass around waist 2# wate bar extension and then IR 10# chest press x 5, then 5# x 8 Yellow tband ER Yellow tband abduction arms at side Supine 2# ER/IR Supine 2# flexion 3# punches and isometric circles STM to the right rhomboid and upper traps  11/21/22 Nustep level 5 x 6 minutes 10# straight arm pulls 10# AR press 5# hip extension and abduction 2x10 2# wand reaching flexion, extension and IR behind back ER yellow tband Small shoulder abduction yellow tband 90 degree IR and ER with support and cuing 3# 2 sets 10 3# shld flex,scaption and abd to 90 10 each Supine PROM  PATIENT EDUCATION: Education details: POC Person educated: Patient Education method: Explanation Education comprehension: verbalized  understanding  HOME EXERCISE PROGRAM: Doorway stretch   ASSESSMENT:  CLINICAL IMPRESSION: Patient reports that she is doing well, She is wanting to try to do more and more, we added walking today and some cabinet reaching without much issue, above 90 degrees with weight she will struggle and has some pain  OBJECTIVE IMPAIRMENTS: decreased balance, decreased endurance, decreased ROM, decreased strength, increased muscle spasms, impaired flexibility, impaired UE functional use, postural dysfunction, and pain.   REHAB POTENTIAL: Good  CLINICAL DECISION MAKING: Stable/uncomplicated  EVALUATION COMPLEXITY: Low   GOALS: Goals reviewed with patient? Yes  SHORT TERM GOALS: Target date: 08/31/22  Independent with initial HEP Goal status: met  LONG TERM GOALS: Target date: 11/09/22  Independent with advanced HEP Goal status: progressing  2.  Decrease pain overall 50% Goal status: progressing  3.  Report no difficulty doing hair Goal status: partially met  4.  Report able to cook Thanksgiving dinner without diffiucty Goal status: met  5.  Increase AROM of the shoulders by 20 degrees for all motions Goal status: met  PLAN:  PT FREQUENCY: 1-2x/week  PT DURATION: 12 weeks  PLANNED INTERVENTIONS: Therapeutic exercises, Therapeutic activity, Neuromuscular re-education, Balance training, Gait training, Patient/Family education, Self Care, Joint mobilization, Joint manipulation, Dry Needling, Electrical stimulation, Cryotherapy, Moist heat, Taping, Vasopneumatic device, and Manual therapy  PLAN FOR NEXT SESSION:  continue with the functional strength, cabinet reaching, eccentrics  Sherrel Shafer W, PT 12/12/2022, 2:49 PM

## 2022-12-14 ENCOUNTER — Encounter: Payer: Self-pay | Admitting: Physical Therapy

## 2022-12-14 ENCOUNTER — Ambulatory Visit: Payer: Medicare Other | Admitting: Physical Therapy

## 2022-12-14 DIAGNOSIS — M6281 Muscle weakness (generalized): Secondary | ICD-10-CM

## 2022-12-14 DIAGNOSIS — R262 Difficulty in walking, not elsewhere classified: Secondary | ICD-10-CM | POA: Diagnosis not present

## 2022-12-14 DIAGNOSIS — R296 Repeated falls: Secondary | ICD-10-CM | POA: Diagnosis not present

## 2022-12-14 DIAGNOSIS — M25511 Pain in right shoulder: Secondary | ICD-10-CM

## 2022-12-14 DIAGNOSIS — M25512 Pain in left shoulder: Secondary | ICD-10-CM | POA: Diagnosis not present

## 2022-12-14 NOTE — Therapy (Signed)
OUTPATIENT PHYSICAL THERAPY SHOULDER TREATMENT      Patient Name: Kim Grant MRN: [id number] DOB:20-Aug-1941, 82 y.o., female Today's Date: 12/14/2022   PT End of Session - 12/14/22 1455     Visit Number 24    Date for PT Re-Evaluation 12/22/22    Authorization Type [employer]BS medicare    PT Start Time 1450    PT Stop Time 1530    PT Time Calculation (min) 40 min    Activity Tolerance Patient tolerated treatment well    Behavior During Therapy [facility] for tasks assessed/performed             Past Medical History:  Diagnosis Date   Anterior tibialis tendinitis 04/18/2016   Arthritis of knee 10/19/2020   Formatting of this note might be different from the original. Added automatically from request for surgery [id number]   Asymptomatic postprocedural ovarian failure 08/04/2014   Bilateral impacted cerumen 05/13/2020   Last Assessment & Plan:  Formatting of this note might be different from the original. HPI:  Complains of stopped up Bilateral ear(s). EXAM: shows cerumen impaction. PLAN: Cerumen removed with various instruments (curettes, suction) giving subjective relief.  External canals and tympanic membranes are otherwise normal.  Return as needed.   Chronic bronchitis ([place]) 08/04/2014   Closed fracture of nasal bone with routine healing 11/02/2021   Last Assessment & Plan:  Formatting of this note might be different from the original. Nasal fracture. Fell with facial trauma about 3 weeks ago.  CT of the face is reviewed and shows minimally displaced nasal fracture.  Denies any nasal obstruction. EXAM shows mild deviation of the nasal dorsum.  Subtle.  Intranasal exam is unremarkable. PLAN: Reassured I think everything is going to be okay with   Depression    DJD (degenerative joint disease), ankle and foot 01/10/2016   Fall 01/20/2017   Fatigue 02/04/2018   Fracture of surgical neck of right humerus 01/20/2017   Hyperlipemia    Mammographic microcalcification found on diagnostic imaging of  breast 08/04/2014   Osteoporosis    Tear of left rotator cuff 07/26/2018   Formatting of this note might be different from the original. 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[place]) 05/25/2017   Primary osteoarthritis of left foot 12/02/2015   Age-related osteoporosis without current pathological fracture 08/04/2014   Mixed hyperlipidemia 08/04/2014   Aortic stenosis 08/04/2014   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/2014   Primary osteoarthritis of right knee 06/08/2014    PCP: Josephine Igo  REFERRING PROVIDER: Leslye Peer DIAG: bilateral shoulder pain  THERAPY DIAG:  Acute pain of left shoulder  Acute pain of right shoulder  Difficulty in walking, not elsewhere classified  Muscle weakness (generalized)  Repeated falls  Rationale for Evaluation and Treatment Rehabilitation  ONSET DATE: September 2023  SUBJECTIVE:  SUBJECTIVE STATEMENT: Reports that she is a little more sore in the right shoulder, she also reports that she was shaking when she was drinking her coffee yesterday lifting the cup up to her mouth PERTINENT HISTORY: Right shoulder humeral fracture and left shoulder RC repair  PAIN:  Are you having pain? Yes: NPRS scale: 4/10 Pain location: bilateral shoulders  Pain description: sharp, ache Aggravating factors: reaching up, out pain up to 6/10 Relieving factors:  rest, OTC pain meds pain at best a 3/10  PRECAUTIONS: None  WEIGHT BEARING RESTRICTIONS: No  FALLS:  Has patient fallen in last 6 months? No  LIVING ENVIRONMENT: Lives with: lives with their family Lives in: House/apartment Stairs: No Has following equipment at home: None  OCCUPATION: retired  PLOF: Independent and gym 2x/week reports not much walking due to "very poor stamina"  PATIENT GOALS:less pain, stronger, take less pain meds, do hair, reach easier  OBJECTIVE:   DIAGNOSTIC FINDINGS:  None recently  PATIENT SURVEYS:  FOTO 36  COGNITION: Overall cognitive status: Within functional limits for tasks assessed     SENSATION: WFL  POSTURE: Fwd head, rounded shoulders  UPPER EXTREMITY ROM:   Active ROM Right eval Left eval Right AROM 10/02/22 Right AROM 10/31/22  Right AROM 11/14/22 Right AROM  12/01/22  Shoulder flexion 80 100 100 110 115 120  Shoulder extension        Shoulder abduction 70 60 80 87 90   Shoulder adduction        Shoulder internal rotation 30 10      Shoulder external rotation 40 50 50 55    Elbow flexion        Elbow extension        Wrist flexion        Wrist extension        Wrist ulnar deviation        Wrist radial deviation        Wrist pronation        Wrist supination        (Blank rows = not tested)  UPPER EXTREMITY MMT:  MMT Right eval Left eval  Shoulder flexion 3- 3-  Shoulder extension    Shoulder abduction 3- 3-  Shoulder adduction    Shoulder internal rotation 3+ 3+  Shoulder external rotation 3 3  Middle trapezius    Lower trapezius    Elbow flexion 4- 4-  Elbow extension 4- 4-  Wrist flexion    Wrist extension    Wrist ulnar deviation    Wrist radial deviation    Wrist pronation    Wrist supination    Grip strength (lbs)    (Blank rows = not tested)  SHOULDER SPECIAL TESTS: Impingement tests: Neer impingement test: positive    PALPATION:  Very tight in the upper trap, neck, very tender here and in  the upper arms and biceps   TODAY'S TREATMENT:  DATE: 12/14/22 UBE level 4 x 5 minutes 2# UE circuit Red tband horizontal abduction Red tband ER Wall slides and circles Tried W backs with PT overpressure Ball vs wall 2 pointes Supine 3# punches, isometric circles PROM, joint distraction  12/12/22 UBE level 4 x 5 minutes Airex balance beam tandem gait and side stepping Cone toe touches 5# hip abduction, flexion and extension 2# UE circuit 2 rounds 2 and 3# cabinet reaching Supine 3# isometric circles, small arc flexion, ER/IR Ball toss 3# extension, flexion with the bar  12/05/22 Nustep level 5 x 6 minutes Resisted gait all directions  Seated row 15# 2x10 Lats 15# 2x10 Chest press 5# x10, 10# x8 5# hip abduction and extension Passive stretch to the Shoulder right to end ranges  12/01/22 UBE level 4 x 6 mintues 2# wate bar up the wall flexion, extension and IR Side step on and off airex On airex ball toss, head turns, eyes closed Ball vs wall PROM of the right shoulder Measured flexion actively to 120 degrees STM to the right deltoid and the upper trap Asked her to add AAROM with both hands in sitting, reclined and in supine  11/23/22 UBE level 4 x 6 mintues 2# wate bar reaching up the finger ladder and eccentric down Wall slides with 1#, circles and scaption Ball throwing Ball pass around waist 2# wate bar extension and then IR 10# chest press x 5, then 5# x 8 Yellow tband ER Yellow tband abduction arms at side Supine 2# ER/IR Supine 2# flexion 3# punches and isometric circles STM to the right rhomboid and upper traps  11/21/22 Nustep level 5 x 6 minutes 10# straight arm pulls 10# AR press 5# hip extension and abduction 2x10 2# wand reaching flexion, extension and IR behind back ER yellow tband Small shoulder abduction yellow tband 90  degree IR and ER with support and cuing 3# 2 sets 10 3# shld flex,scaption and abd to 90 10 each Supine PROM  PATIENT EDUCATION: Education details: POC Person educated: Patient Education method: Explanation Education comprehension: verbalized understanding  HOME EXERCISE PROGRAM: Doorway stretch   ASSESSMENT:  CLINICAL IMPRESSION: Patient a little sore today, she c/o some weakness and with her hand shaking with drinking coffee. Her motion and overall function is improving.  OBJECTIVE IMPAIRMENTS: decreased balance, decreased endurance, decreased ROM, decreased strength, increased muscle spasms, impaired flexibility, impaired UE functional use, postural dysfunction, and pain.   REHAB POTENTIAL: Good  CLINICAL DECISION MAKING: Stable/uncomplicated  EVALUATION COMPLEXITY: Low   GOALS: Goals reviewed with patient? Yes  SHORT TERM GOALS: Target date: 08/31/22  Independent with initial HEP Goal status: met  LONG TERM GOALS: Target date: 11/09/22  Independent with advanced HEP Goal status: progressing  2.  Decrease pain overall 50% Goal status: progressing  3.  Report no difficulty doing hair Goal status: partially met  4.  Report able to cook Thanksgiving dinner without diffiucty Goal status: met  5.  Increase AROM of the shoulders by 20 degrees for all motions Goal status: met  PLAN:  PT FREQUENCY: 1-2x/week  PT DURATION: 12 weeks  PLANNED INTERVENTIONS: Therapeutic exercises, Therapeutic activity, Neuromuscular re-education, Balance training, Gait training, Patient/Family education, Self Care, Joint mobilization, Joint manipulation, Dry Needling, Electrical stimulation, Cryotherapy, Moist heat, Taping, Vasopneumatic device, and Manual therapy  PLAN FOR NEXT SESSION:  continue with the functional strength, cabinet reaching, eccentrics  Riven Mabile W, PT 12/14/2022, 2:56 PM

## 2022-12-19 ENCOUNTER — Encounter: Payer: Self-pay | Admitting: Physical Therapy

## 2022-12-19 ENCOUNTER — Ambulatory Visit: Payer: Medicare Other | Admitting: Physical Therapy

## 2022-12-19 DIAGNOSIS — M25511 Pain in right shoulder: Secondary | ICD-10-CM | POA: Diagnosis not present

## 2022-12-19 DIAGNOSIS — R262 Difficulty in walking, not elsewhere classified: Secondary | ICD-10-CM | POA: Diagnosis not present

## 2022-12-19 DIAGNOSIS — M25512 Pain in left shoulder: Secondary | ICD-10-CM | POA: Diagnosis not present

## 2022-12-19 DIAGNOSIS — M6281 Muscle weakness (generalized): Secondary | ICD-10-CM | POA: Diagnosis not present

## 2022-12-19 DIAGNOSIS — R296 Repeated falls: Secondary | ICD-10-CM

## 2022-12-19 NOTE — Therapy (Signed)
OUTPATIENT PHYSICAL THERAPY SHOULDER TREATMENT      Patient Name: Kim Grant MRN: [id number] DOB:1941/04/11, 82 y.o., female Today's Date: 12/19/2022   PT End of Session - 12/19/22 1319     Visit Number 25    Date for PT Re-Evaluation 12/22/22    Authorization Type [employer] medicare    PT Start Time [id number]    PT Stop Time 1400    PT Time Calculation (min) 47 min    Activity Tolerance Patient tolerated treatment well    Behavior During Therapy [facility] for tasks assessed/performed             Past Medical History:  Diagnosis Date   Anterior tibialis tendinitis 04/18/2016   Arthritis of knee 10/19/2020   Formatting of this note might be different from the original. Added automatically from request for surgery [id number]   Asymptomatic postprocedural ovarian failure 08/04/2014   Bilateral impacted cerumen 05/13/2020   Last Assessment & Plan:  Formatting of this note might be different from the original. HPI:  Complains of stopped up Bilateral ear(s). EXAM: shows cerumen impaction. PLAN: Cerumen removed with various instruments (curettes, suction) giving subjective relief.  External canals and tympanic membranes are otherwise normal.  Return as needed.   Chronic bronchitis ([place]) 08/04/2014   Closed fracture of nasal bone with routine healing 11/02/2021   Last Assessment & Plan:  Formatting of this note might be different from the original. Nasal fracture. Fell with facial trauma about 3 weeks ago.  CT of the face is reviewed and shows minimally displaced nasal fracture.  Denies any nasal obstruction. EXAM shows mild deviation of the nasal dorsum.  Subtle.  Intranasal exam is unremarkable. PLAN: Reassured I think everything is going to be okay with   Depression    DJD (degenerative joint disease), ankle and foot 01/10/2016   Fall 01/20/2017   Fatigue 02/04/2018   Fracture of surgical neck of right humerus 01/20/2017   Hyperlipemia    Mammographic microcalcification found on diagnostic imaging of  breast 08/04/2014   Osteoporosis    Tear of left rotator cuff 07/26/2018   Formatting of this note might be different from the original. 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[place]) 05/25/2017   Primary osteoarthritis of left foot 12/02/2015   Age-related osteoporosis without current pathological fracture 08/04/2014   Mixed hyperlipidemia 08/04/2014   Aortic stenosis 08/04/2014   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/2014   Primary osteoarthritis of right knee 06/08/2014    PCP: Josephine Igo  REFERRING PROVIDER: Leslye Peer DIAG: bilateral shoulder pain  THERAPY DIAG:  Acute pain of left shoulder  Acute pain of right shoulder  Difficulty in walking, not elsewhere classified  Muscle weakness (generalized)  Repeated falls  Rationale for Evaluation and Treatment Rehabilitation  ONSET DATE: September 2023  SUBJECTIVE:  SUBJECTIVE STATEMENT: No big issues, reports that she fatigues easily when walking PERTINENT HISTORY: Right shoulder humeral fracture and left shoulder RC repair  PAIN:  Are you having pain? Yes: NPRS scale: 4/10 Pain location: bilateral shoulders  Pain description: sharp, ache Aggravating factors: reaching up, out pain up to 6/10 Relieving factors: rest, OTC pain meds pain at best a 3/10  PRECAUTIONS: None  WEIGHT BEARING RESTRICTIONS: No  FALLS:  Has  patient fallen in last 6 months? No  LIVING ENVIRONMENT: Lives with: lives with their family Lives in: House/apartment Stairs: No Has following equipment at home: None  OCCUPATION: retired  PLOF: Independent and gym 2x/week reports not much walking due to "very poor stamina"  PATIENT GOALS:less pain, stronger, take less pain meds, do hair, reach easier  OBJECTIVE:   DIAGNOSTIC FINDINGS:  None recently  PATIENT SURVEYS:  FOTO 36  COGNITION: Overall cognitive status: Within functional limits for tasks assessed     SENSATION: WFL  POSTURE: Fwd head, rounded shoulders  UPPER EXTREMITY ROM:   Active ROM Right eval Left eval Right AROM 10/02/22 Right AROM 10/31/22  Right AROM 11/14/22 Right AROM  12/01/22  Shoulder flexion 80 100 100 110 115 120  Shoulder extension        Shoulder abduction 70 60 80 87 90   Shoulder adduction        Shoulder internal rotation 30 10      Shoulder external rotation 40 50 50 55    Elbow flexion        Elbow extension        Wrist flexion        Wrist extension        Wrist ulnar deviation        Wrist radial deviation        Wrist pronation        Wrist supination        (Blank rows = not tested)  UPPER EXTREMITY MMT:  MMT Right eval Left eval  Shoulder flexion 3- 3-  Shoulder extension    Shoulder abduction 3- 3-  Shoulder adduction    Shoulder internal rotation 3+ 3+  Shoulder external rotation 3 3  Middle trapezius    Lower trapezius    Elbow flexion 4- 4-  Elbow extension 4- 4-  Wrist flexion    Wrist extension    Wrist ulnar deviation    Wrist radial deviation    Wrist pronation    Wrist supination    Grip strength (lbs)    (Blank rows = not tested)  SHOULDER SPECIAL TESTS: Impingement tests: Neer impingement test: positive    PALPATION:  Very tight in the upper trap, neck, very tender here and in the upper arms and biceps   TODAY'S TREATMENT:                                                                                                                            DATE:  12/19/22 Bike LEvel 4 x 5 minutes 3 power bursts Nustep level 5 x 4 minutes Serratus rolls 10# straight arm pulls 40# resisted gait all directions 3# wate bar overhead carry 2 laps Kneel on bosu and use the mat table to get up from their Yellow tband ER 2x10 Yellow tband small abduction Yellow tband horizontal abduction Volleyball Passive stretch right shoulder supine  12/14/22 UBE level 4 x 5 minutes 2# UE circuit Red tband horizontal abduction Red tband ER Wall slides and circles Tried W backs with PT overpressure Ball vs wall 2 pointes Supine 3# punches, isometric circles PROM, joint distraction  12/12/22 UBE level 4 x 5 minutes Airex balance beam tandem gait and side stepping Cone toe touches 5# hip abduction, flexion and extension 2# UE circuit 2 rounds 2 and 3# cabinet reaching Supine 3# isometric circles, small arc flexion, ER/IR Ball toss 3# extension, flexion with the bar  12/05/22 Nustep level 5 x 6 minutes Resisted gait all directions  Seated row 15# 2x10 Lats 15# 2x10 Chest press 5# x10, 10# x8 5# hip abduction and extension Passive stretch to the Shoulder right to end ranges  12/01/22 UBE level 4 x 6 mintues 2# wate bar up the wall flexion, extension and IR Side step on and off airex On airex ball toss, head turns, eyes closed Ball vs wall PROM of the right shoulder Measured flexion actively to 120 degrees STM to the right deltoid and the upper trap Asked her to add AAROM with both hands in sitting, reclined and in supine  11/23/22 UBE level 4 x 6 mintues 2# wate bar reaching up the finger ladder and eccentric down Wall slides with 1#, circles and scaption Ball throwing Ball pass around waist 2# wate bar extension and then IR 10# chest press x 5, then 5# x 8 Yellow tband ER Yellow tband abduction arms at side Supine 2# ER/IR Supine 2# flexion 3# punches and isometric circles STM  to the right rhomboid and upper traps  PATIENT EDUCATION: Education details: POC Person educated: Patient Education method: Explanation Education comprehension: verbalized understanding  HOME EXERCISE PROGRAM: Doorway stretch   ASSESSMENT:  CLINICAL IMPRESSION: Tried some different things to help with her overall function, she has some balance issues and reports that she has not been able to get up from the floor so we tried some things to helpo with this, I did try to have her do some power bursts ont eh bike but she really did not push hard enough for the benefit today OBJECTIVE IMPAIRMENTS: decreased balance, decreased endurance, decreased ROM, decreased strength, increased muscle spasms, impaired flexibility, impaired UE functional use, postural dysfunction, and pain.   REHAB POTENTIAL: Good  CLINICAL DECISION MAKING: Stable/uncomplicated  EVALUATION COMPLEXITY: Low   GOALS: Goals reviewed with patient? Yes  SHORT TERM GOALS: Target date: 08/31/22  Independent with initial HEP Goal status: met  LONG TERM GOALS: Target date: 11/09/22  Independent with advanced HEP Goal status: progressing  2.  Decrease pain overall 50% Goal status: progressing  3.  Report no difficulty doing hair Goal status: met  4.  Report able to cook Thanksgiving dinner without diffiucty Goal status: met  5.  Increase AROM of the shoulders by 20 degrees for all motions Goal status: met  PLAN:  PT FREQUENCY: 1-2x/week  PT DURATION: 12 weeks  PLANNED INTERVENTIONS: Therapeutic exercises, Therapeutic activity, Neuromuscular re-education, Balance training, Gait training, Patient/Family education, Self Care, Joint mobilization, Joint manipulation, Dry Needling, Electrical stimulation, Cryotherapy, Moist heat,  Taping, Vasopneumatic device, and Manual therapy  PLAN FOR NEXT SESSION:  continue with the functional strength, cabinet reaching, eccentrics  Ghali Morissette W, PT 12/19/2022, 1:19 PM

## 2022-12-21 ENCOUNTER — Encounter: Payer: Self-pay | Admitting: Physical Therapy

## 2022-12-21 ENCOUNTER — Ambulatory Visit: Payer: Medicare Other | Admitting: Physical Therapy

## 2022-12-21 DIAGNOSIS — M6281 Muscle weakness (generalized): Secondary | ICD-10-CM | POA: Diagnosis not present

## 2022-12-21 DIAGNOSIS — M25512 Pain in left shoulder: Secondary | ICD-10-CM | POA: Diagnosis not present

## 2022-12-21 DIAGNOSIS — R296 Repeated falls: Secondary | ICD-10-CM | POA: Diagnosis not present

## 2022-12-21 DIAGNOSIS — R262 Difficulty in walking, not elsewhere classified: Secondary | ICD-10-CM | POA: Diagnosis not present

## 2022-12-21 DIAGNOSIS — M25511 Pain in right shoulder: Secondary | ICD-10-CM

## 2022-12-21 NOTE — Therapy (Signed)
OUTPATIENT PHYSICAL THERAPY SHOULDER TREATMENT      Patient Name: Kim Grant MRN: [id number] DOB:10-Mar-1941, 82 y.o., female Today's Date: 12/21/2022   PT End of Session - 12/21/22 1412     Visit Number 26    Date for PT Re-Evaluation 12/22/22    Authorization Type [employer] medicare    PT Start Time 1408    PT Stop Time 1449    PT Time Calculation (min) 41 min    Activity Tolerance Patient tolerated treatment well    Behavior During Therapy [facility] for tasks assessed/performed             Past Medical History:  Diagnosis Date   Anterior tibialis tendinitis 04/18/2016   Arthritis of knee 10/19/2020   Formatting of this note might be different from the original. Added automatically from request for surgery [id number]   Asymptomatic postprocedural ovarian failure 08/04/2014   Bilateral impacted cerumen 05/13/2020   Last Assessment & Plan:  Formatting of this note might be different from the original. HPI:  Complains of stopped up Bilateral ear(s). EXAM: shows cerumen impaction. PLAN: Cerumen removed with various instruments (curettes, suction) giving subjective relief.  External canals and tympanic membranes are otherwise normal.  Return as needed.   Chronic bronchitis ([place]) 08/04/2014   Closed fracture of nasal bone with routine healing 11/02/2021   Last Assessment & Plan:  Formatting of this note might be different from the original. Nasal fracture. Fell with facial trauma about 3 weeks ago.  CT of the face is reviewed and shows minimally displaced nasal fracture.  Denies any nasal obstruction. EXAM shows mild deviation of the nasal dorsum.  Subtle.  Intranasal exam is unremarkable. PLAN: Reassured I think everything is going to be okay with   Depression    DJD (degenerative joint disease), ankle and foot 01/10/2016   Fall 01/20/2017   Fatigue 02/04/2018   Fracture of surgical neck of right humerus 01/20/2017   Hyperlipemia    Mammographic microcalcification found on diagnostic imaging of  breast 08/04/2014   Osteoporosis    Tear of left rotator cuff 07/26/2018   Formatting of this note might be different from the original. 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[place]) 05/25/2017   Primary osteoarthritis of left foot 12/02/2015   Age-related osteoporosis without current pathological fracture 08/04/2014   Mixed hyperlipidemia 08/04/2014   Aortic stenosis 08/04/2014   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/2014   Primary osteoarthritis of right knee 06/08/2014    PCP: Josephine Igo  REFERRING PROVIDER: Leslye Peer DIAG: bilateral shoulder pain  THERAPY DIAG:  Acute pain of right shoulder  Difficulty in walking, not elsewhere classified  Muscle weakness (generalized)  Acute pain of left shoulder  Repeated falls  Rationale for Evaluation and Treatment Rehabilitation  ONSET DATE: September 2023  SUBJECTIVE:  SUBJECTIVE STATEMENT: REally doing pretty good, just a little sore PERTINENT HISTORY: Right shoulder humeral fracture and left shoulder RC repair  PAIN:  Are you having pain? Yes: NPRS scale: 4/10 Pain location: bilateral shoulders  Pain description: sharp, ache Aggravating factors: reaching up, out pain up to 6/10 Relieving factors: rest, OTC pain meds pain at best a 3/10  PRECAUTIONS: None  WEIGHT BEARING RESTRICTIONS: No  FALLS:  Has patient fallen in  last 6 months? No  LIVING ENVIRONMENT: Lives with: lives with their family Lives in: House/apartment Stairs: No Has following equipment at home: None  OCCUPATION: retired  PLOF: Independent and gym 2x/week reports not much walking due to "very poor stamina"  PATIENT GOALS:less pain, stronger, take less pain meds, do hair, reach easier  OBJECTIVE:   DIAGNOSTIC FINDINGS:  None recently  PATIENT SURVEYS:  FOTO 36  COGNITION: Overall cognitive status: Within functional limits for tasks assessed     SENSATION: WFL  POSTURE: Fwd head, rounded shoulders  UPPER EXTREMITY ROM:   Active ROM Right eval Left eval Right AROM 10/02/22 Right AROM 10/31/22  Right AROM 11/14/22 Right AROM  12/01/22 Right AROM 12/21/22  Shoulder flexion 80 100 100 110 115 120 127  Shoulder extension         Shoulder abduction 70 60 80 87 90  104  Shoulder adduction         Shoulder internal rotation 30 10     45  Shoulder external rotation 40 50 50 55   62  Elbow flexion         Elbow extension         Wrist flexion         Wrist extension         Wrist ulnar deviation         Wrist radial deviation         Wrist pronation         Wrist supination         (Blank rows = not tested)  UPPER EXTREMITY MMT:  MMT Right eval Left eval  Shoulder flexion 3- 3-  Shoulder extension    Shoulder abduction 3- 3-  Shoulder adduction    Shoulder internal rotation 3+ 3+  Shoulder external rotation 3 3  Middle trapezius    Lower trapezius    Elbow flexion 4- 4-  Elbow extension 4- 4-  Wrist flexion    Wrist extension    Wrist ulnar deviation    Wrist radial deviation    Wrist pronation    Wrist supination    Grip strength (lbs)    (Blank rows = not tested)  SHOULDER SPECIAL TESTS: Impingement tests: Neer impingement test: positive    PALPATION:  Very tight in the upper trap, neck, very tender here and in the upper arms and biceps   TODAY'S TREATMENT:  DATE: 12/21/22 UBE level 4 x 4 minutes Bike level 4 x 4 minutes with 3 power bursts 3# wate bar overhead carry Wate bar finger ladder reach, wate bar extension On airex ball toss Ball roll on wall Ball vs wall Supine 5# punches, 3# Flexion, 3# ER/IR  12/19/22 Bike LEvel 4 x 5 minutes 3 power bursts Nustep level 5 x 4 minutes Serratus rolls 10# straight arm pulls 40# resisted gait all directions 3# wate bar overhead carry 2 laps Kneel on bosu and use the mat table to get up from their Yellow tband ER 2x10 Yellow tband small abduction Yellow tband horizontal abduction Volleyball Passive stretch right shoulder supine  12/14/22 UBE level 4 x 5 minutes 2# UE circuit Red tband horizontal abduction Red tband ER Wall slides and circles Tried W backs with PT overpressure Ball vs wall 2 pointes Supine 3# punches, isometric circles PROM, joint distraction  12/12/22 UBE level 4 x 5 minutes Airex balance beam tandem gait and side stepping Cone toe touches 5# hip abduction, flexion and extension 2# UE circuit 2 rounds 2 and 3# cabinet reaching Supine 3# isometric circles, small arc flexion, ER/IR Ball toss 3# extension, flexion with the bar  12/05/22 Nustep level 5 x 6 minutes Resisted gait all directions  Seated row 15# 2x10 Lats 15# 2x10 Chest press 5# x10, 10# x8 5# hip abduction and extension Passive stretch to the Shoulder right to end ranges  12/01/22 UBE level 4 x 6 mintues 2# wate bar up the wall flexion, extension and IR Side step on and off airex On airex ball toss, head turns, eyes closed Ball vs wall PROM of the right shoulder Measured flexion actively to 120 degrees STM to the right deltoid and the upper trap Asked her to add AAROM with both hands in sitting, reclined and in supine  11/23/22 UBE level 4 x 6 mintues 2# wate bar reaching up the finger ladder and eccentric  down Wall slides with 1#, circles and scaption Ball throwing Ball pass around waist 2# wate bar extension and then IR 10# chest press x 5, then 5# x 8 Yellow tband ER Yellow tband abduction arms at side Supine 2# ER/IR Supine 2# flexion 3# punches and isometric circles STM to the right rhomboid and upper traps  PATIENT EDUCATION: Education details: POC Person educated: Patient Education method: Explanation Education comprehension: verbalized understanding  HOME EXERCISE PROGRAM: Doorway stretch   ASSESSMENT:  CLINICAL IMPRESSION: I measured today and she has really improved her AROM and this has helped her function.  She is reporting less pain overall.  She is able to reach to shoulder height cabinet with 3# and head high cabinet with 1#.   OBJECTIVE IMPAIRMENTS: decreased balance, decreased endurance, decreased ROM, decreased strength, increased muscle spasms, impaired flexibility, impaired UE functional use, postural dysfunction, and pain.   REHAB POTENTIAL: Good  CLINICAL DECISION MAKING: Stable/uncomplicated  EVALUATION COMPLEXITY: Low   GOALS: Goals reviewed with patient? Yes  SHORT TERM GOALS: Target date: 08/31/22  Independent with initial HEP Goal status: met  LONG TERM GOALS: Target date: 11/09/22  Independent with advanced HEP Goal status: progressing  2.  Decrease pain overall 50% Goal status: progressing  3.  Report no difficulty doing hair Goal status: met  4.  Report able to cook Thanksgiving dinner without diffiucty Goal status: met  5.  Increase AROM of the shoulders by 20 degrees for all motions Goal status: met  PLAN:  PT FREQUENCY: 1-2x/week  PT DURATION:  12 weeks  PLANNED INTERVENTIONS: Therapeutic exercises, Therapeutic activity, Neuromuscular re-education, Balance training, Gait training, Patient/Family education, Self Care, Joint mobilization, Joint manipulation, Dry Needling, Electrical stimulation, Cryotherapy, Moist heat,  Taping, Vasopneumatic device, and Manual therapy  PLAN FOR NEXT SESSION:  Will continue over the next period to maximize her functional motion and strength as well as her balance issues  Sumner Boast, PT 12/21/2022, 2:12 PM

## 2022-12-22 ENCOUNTER — Inpatient Hospital Stay: Payer: Medicare Other | Admitting: Family

## 2022-12-22 ENCOUNTER — Inpatient Hospital Stay: Payer: Medicare Other

## 2022-12-25 ENCOUNTER — Encounter: Payer: Self-pay | Admitting: Physical Therapy

## 2022-12-25 ENCOUNTER — Ambulatory Visit: Payer: Medicare Other | Attending: Sports Medicine | Admitting: Physical Therapy

## 2022-12-25 DIAGNOSIS — M6281 Muscle weakness (generalized): Secondary | ICD-10-CM | POA: Diagnosis not present

## 2022-12-25 DIAGNOSIS — R262 Difficulty in walking, not elsewhere classified: Secondary | ICD-10-CM | POA: Insufficient documentation

## 2022-12-25 DIAGNOSIS — M25511 Pain in right shoulder: Secondary | ICD-10-CM | POA: Insufficient documentation

## 2022-12-25 DIAGNOSIS — M25512 Pain in left shoulder: Secondary | ICD-10-CM | POA: Diagnosis not present

## 2022-12-25 DIAGNOSIS — R296 Repeated falls: Secondary | ICD-10-CM | POA: Diagnosis not present

## 2022-12-25 NOTE — Therapy (Signed)
OUTPATIENT PHYSICAL THERAPY SHOULDER TREATMENT      Patient Name: Kim Grant MRN: [id number] DOB:02-Jun-1941, 82 y.o., female Today's Date: 12/25/2022   PT End of Session - 12/25/22 1410     Visit Number 27    Date for PT Re-Evaluation 01/25/23    Authorization Type [employer] medicare    PT Start Time 1400    PT Stop Time 1445    PT Time Calculation (min) 45 min    Activity Tolerance Patient tolerated treatment well    Behavior During Therapy [facility] for tasks assessed/performed             Past Medical History:  Diagnosis Date   Anterior tibialis tendinitis 04/18/2016   Arthritis of knee 10/19/2020   Formatting of this note might be different from the original. Added automatically from request for surgery [id number]   Asymptomatic postprocedural ovarian failure 08/04/2014   Bilateral impacted cerumen 05/13/2020   Last Assessment & Plan:  Formatting of this note might be different from the original. HPI:  Complains of stopped up Bilateral ear(s). EXAM: shows cerumen impaction. PLAN: Cerumen removed with various instruments (curettes, suction) giving subjective relief.  External canals and tympanic membranes are otherwise normal.  Return as needed.   Chronic bronchitis ([place]) 08/04/2014   Closed fracture of nasal bone with routine healing 11/02/2021   Last Assessment & Plan:  Formatting of this note might be different from the original. Nasal fracture. Fell with facial trauma about 3 weeks ago.  CT of the face is reviewed and shows minimally displaced nasal fracture.  Denies any nasal obstruction. EXAM shows mild deviation of the nasal dorsum.  Subtle.  Intranasal exam is unremarkable. PLAN: Reassured I think everything is going to be okay with   Depression    DJD (degenerative joint disease), ankle and foot 01/10/2016   Fall 01/20/2017   Fatigue 02/04/2018   Fracture of surgical neck of right humerus 01/20/2017   Hyperlipemia    Mammographic microcalcification found on diagnostic imaging of  breast 08/04/2014   Osteoporosis    Tear of left rotator cuff 07/26/2018   Formatting of this note might be different from the original. 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[place]) 05/25/2017   Primary osteoarthritis of left foot 12/02/2015   Age-related osteoporosis without current pathological fracture 08/04/2014   Mixed hyperlipidemia 08/04/2014   Aortic stenosis 08/04/2014   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  Still sore.  Doing better with dressing and some ADL's PERTINENT HISTORY: Right shoulder humeral fracture and left shoulder RC repair  PAIN:  Are you having pain? Yes: NPRS scale: 3/10 Pain location: bilateral shoulders  Pain description: sharp, ache Aggravating factors: reaching up, out pain up to 6/10 Relieving factors: rest, OTC pain meds pain at best a 3/10  PRECAUTIONS: None  WEIGHT BEARING RESTRICTIONS: No  FALLS:   Has patient fallen in last 6 months? No  LIVING ENVIRONMENT: Lives with: lives with their family Lives in: House/apartment Stairs: No Has following equipment at home: None  OCCUPATION: retired  PLOF: Independent and gym 2x/week reports not much walking due to "very poor stamina"  PATIENT GOALS:less pain, stronger, take less pain meds, do hair, reach easier  OBJECTIVE:   DIAGNOSTIC FINDINGS:  None recently  PATIENT SURVEYS:  FOTO 36  COGNITION: Overall cognitive status: Within functional limits for tasks assessed     SENSATION: WFL  POSTURE: Fwd head, rounded shoulders  UPPER EXTREMITY ROM:   Active ROM Right eval Left eval Right AROM 10/02/22 Right AROM 10/31/22  Right AROM 11/14/22 Right AROM  12/01/22 Right AROM 12/21/22  Shoulder flexion 80 100 100 110 115 120 127  Shoulder extension         Shoulder abduction 70 60 80 87 90  104  Shoulder adduction         Shoulder internal rotation 30 10     45  Shoulder external rotation 40 50 50 55   62  Elbow flexion         Elbow extension         Wrist flexion         Wrist extension         Wrist ulnar deviation         Wrist radial deviation         Wrist pronation         Wrist supination         (Blank rows = not tested)  UPPER EXTREMITY MMT:  MMT Right eval Left eval Right 12/25/22  Shoulder flexion 3- 3- 3+  Shoulder extension     Shoulder abduction 3- 3- 3+  Shoulder adduction     Shoulder internal rotation 3+ 3+ 4-  Shoulder external rotation 3 3 4-  Middle trapezius     Lower trapezius     Elbow flexion 4- 4-   Elbow extension 4- 4-   Wrist flexion     Wrist extension     Wrist ulnar deviation     Wrist radial deviation     Wrist pronation     Wrist supination     Grip strength (lbs)     (Blank rows = not tested)  SHOULDER SPECIAL TESTS: Impingement tests: Neer impingement test: positive    PALPATION:  Very tight in the upper trap, neck, very tender here and in the upper arms and  biceps   TODAY'S TREATMENT:  DATE: 12/25/22 Nustep level 5 x 6 minutes 10# straight arm pulls 2x10 25# triceps 2x10 5# biceps 2x10 5# hips abduction, extension and fleixon Doorway stretch 3# bar overhead carry 3# and 1# cabinet reaching PROM, joint mobs of the right shoulder  12/21/22 UBE level 4 x 4 minutes Bike level 4 x 4 minutes with 3 power bursts 3# wate bar overhead carry Wate bar finger ladder reach, wate bar extension On airex ball toss Ball roll on wall Ball vs wall Supine 5# punches, 3# Flexion, 3# ER/IR  12/19/22 Bike LEvel 4 x 5 minutes 3 power bursts Nustep level 5 x 4 minutes Serratus rolls 10# straight arm pulls 40# resisted gait all directions 3# wate bar overhead carry 2 laps Kneel on bosu and use the mat table to get up from their Yellow tband ER 2x10 Yellow tband small abduction Yellow tband horizontal abduction Volleyball Passive stretch right shoulder supine  12/14/22 UBE level 4 x 5 minutes 2# UE circuit Red tband horizontal abduction Red tband ER Wall slides and circles Tried W backs with PT overpressure Ball vs wall 2 pointes Supine 3# punches, isometric circles PROM, joint distraction  12/12/22 UBE level 4 x 5 minutes Airex balance beam tandem gait and side stepping Cone toe touches 5# hip abduction, flexion and extension 2# UE circuit 2 rounds 2 and 3# cabinet reaching Supine 3# isometric circles, small arc flexion, ER/IR Ball toss 3# extension, flexion with the bar  12/05/22 Nustep level 5 x 6 minutes Resisted gait all directions  Seated row 15# 2x10 Lats 15# 2x10 Chest press 5# x10, 10# x8 5# hip abduction and extension Passive stretch to the Shoulder right to end ranges  12/01/22 UBE level 4 x 6 mintues 2# wate bar up the wall flexion, extension and IR Side step on and off airex On airex ball toss, head  turns, eyes closed Ball vs wall PROM of the right shoulder Measured flexion actively to 120 degrees STM to the right deltoid and the upper trap Asked her to add AAROM with both hands in sitting, reclined and in supine  11/23/22 UBE level 4 x 6 mintues 2# wate bar reaching up the finger ladder and eccentric down Wall slides with 1#, circles and scaption Ball throwing Ball pass around waist 2# wate bar extension and then IR 10# chest press x 5, then 5# x 8 Yellow tband ER Yellow tband abduction arms at side Supine 2# ER/IR Supine 2# flexion 3# punches and isometric circles STM to the right rhomboid and upper traps  PATIENT EDUCATION: Education details: POC Person educated: Patient Education method: Explanation Education comprehension: verbalized understanding  HOME EXERCISE PROGRAM: Doorway stretch   ASSESSMENT:  CLINICAL IMPRESSION: Patient continues to improve with her function and strength, much easier reaching to shoulder high with 3#, and can reach to head high with 1#, has times when she is not in pain but other times she is really hurting, overall she is in less pain OBJECTIVE IMPAIRMENTS: decreased balance, decreased endurance, decreased ROM, decreased strength, increased muscle spasms, impaired flexibility, impaired UE functional use, postural dysfunction, and pain.   REHAB POTENTIAL: Good  CLINICAL DECISION MAKING: Stable/uncomplicated  EVALUATION COMPLEXITY: Low   GOALS: Goals reviewed with patient? Yes  SHORT TERM GOALS: Target date: 08/31/22  Independent with initial HEP Goal status: met  LONG TERM GOALS: Target date: 11/09/22  Independent with advanced HEP Goal status: progressing  2.  Decrease pain overall 50% Goal status: progressing  3.  Report no  difficulty doing hair Goal status: met  4.  Report able to cook Thanksgiving dinner without diffiucty Goal status: met  5.  Increase AROM of the shoulders by 20 degrees for all motions Goal  status: met  PLAN:  PT FREQUENCY: 1-2x/week  PT DURATION: 12 weeks  PLANNED INTERVENTIONS: Therapeutic exercises, Therapeutic activity, Neuromuscular re-education, Balance training, Gait training, Patient/Family education, Self Care, Joint mobilization, Joint manipulation, Dry Needling, Electrical stimulation, Cryotherapy, Moist heat, Taping, Vasopneumatic device, and Manual therapy  PLAN FOR NEXT SESSION:  Will continue over the next period to maximize her functional motion and strength as well as her balance issues  Sumner Boast, PT 12/25/2022, 2:11 PM

## 2022-12-27 ENCOUNTER — Ambulatory Visit (INDEPENDENT_AMBULATORY_CARE_PROVIDER_SITE_OTHER): Payer: Medicare Other | Admitting: Family Medicine

## 2022-12-27 ENCOUNTER — Encounter: Payer: Self-pay | Admitting: Family Medicine

## 2022-12-27 VITALS — BP 130/82 | HR 73 | Temp 97.6°F | Ht 64.0 in | Wt 169.4 lb

## 2022-12-27 DIAGNOSIS — I739 Peripheral vascular disease, unspecified: Secondary | ICD-10-CM | POA: Insufficient documentation

## 2022-12-27 DIAGNOSIS — R413 Other amnesia: Secondary | ICD-10-CM | POA: Diagnosis not present

## 2022-12-27 DIAGNOSIS — H9113 Presbycusis, bilateral: Secondary | ICD-10-CM | POA: Diagnosis not present

## 2022-12-27 DIAGNOSIS — M1711 Unilateral primary osteoarthritis, right knee: Secondary | ICD-10-CM

## 2022-12-27 DIAGNOSIS — K449 Diaphragmatic hernia without obstruction or gangrene: Secondary | ICD-10-CM | POA: Insufficient documentation

## 2022-12-27 DIAGNOSIS — K257 Chronic gastric ulcer without hemorrhage or perforation: Secondary | ICD-10-CM | POA: Insufficient documentation

## 2022-12-27 DIAGNOSIS — E782 Mixed hyperlipidemia: Secondary | ICD-10-CM

## 2022-12-27 DIAGNOSIS — Z0000 Encounter for general adult medical examination without abnormal findings: Secondary | ICD-10-CM | POA: Insufficient documentation

## 2022-12-27 DIAGNOSIS — I700 Atherosclerosis of aorta: Secondary | ICD-10-CM | POA: Insufficient documentation

## 2022-12-27 LAB — LIPID PANEL
Cholesterol: 236 mg/dL — ABNORMAL HIGH (ref 0–200)
HDL: 86.1 mg/dL (ref >39.00)
LDL Cholesterol: 132 mg/dL — ABNORMAL HIGH (ref 0–99)
NonHDL: 150.26
Total CHOL/HDL Ratio: 3
Triglycerides: 89 mg/dL (ref 0.0–149.0)
VLDL: 17.8 mg/dL (ref 0.0–40.0)

## 2022-12-27 LAB — URINALYSIS, ROUTINE W REFLEX MICROSCOPIC
Bilirubin Urine: NEGATIVE
Hgb urine dipstick: NEGATIVE
Ketones, ur: NEGATIVE
Leukocytes,Ua: NEGATIVE
Nitrite: NEGATIVE
Specific Gravity, Urine: 1.015 (ref 1.000–1.030)
Total Protein, Urine: NEGATIVE
Urine Glucose: NEGATIVE
Urobilinogen, UA: 0.2 (ref 0.0–1.0)
pH: 7.5 (ref 5.0–8.0)

## 2022-12-27 LAB — B12 AND FOLATE PANEL
Folate: >23.9 ng/mL (ref >5.9)
Vitamin B-12: 892 pg/mL (ref 211–911)

## 2022-12-27 NOTE — Assessment & Plan Note (Signed)
Discussed the potential link between small vessel disease and memory changes. Suggested further evaluation by Neurology to assess cognitive function and explore potential interventions for memory concerns.  Ordering lab work to rule out reversible causes.  Patient depression well-controlled on citalopram, unlikely to be cause of pseudodementia

## 2022-12-27 NOTE — Assessment & Plan Note (Signed)
Noted patient's intermittent shortness of breath and relation to hiatal hernia. Advised watchful waiting, considering cardiology evaluation was satisfactory and symptoms are not pervasive. Proposed revisiting this issue if symptoms escalate or change in character.

## 2022-12-27 NOTE — Progress Notes (Signed)
Assessment  Assessment/Plan:   Problem List Items Addressed This Visit       Cardiovascular and Mediastinum   Aortic atherosclerosis ([place])     Acknowledged a known atherosclerosis and reinforcement of the importance of cholesterol management to prevent progression.      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 Advised watchful waiting, considering cardiology evaluation was satisfactory and symptoms are not pervasive. Proposed revisiting this issue if symptoms escalate or change in character.        Digestive   Chronic gastric ulcer    Discussed concerns about long-term pantoprazole use. Encouraged continued use due to the risk of ulcer complications. Reinforced the need for a follow-up as recommended by Gastroenterology and to discuss any changes to proton pump inhibitor therapy.        Nervous and Auditory   Presbycusis    Discussion regarding measured hearing loss. Mentioned patient's interest in hearing aids and difficulty in obtaining an appointment for fitting. Advised patient to seek audiologist consultation and consider alternative providers for hearing aid fitting if preferred. Recommended follow-up with Audiology or other hearing aid services as needed.        Musculoskeletal and Integument   Primary osteoarthritis of right knee    Discussed the importance of weight management and strength training to support joint health. Advised patient to continue with physical therapy as prescribed and to reach out if there is a need for further intervention or referral back to Orthopedics.        Other   Mixed hyperlipidemia - Primary    Plan to continue with current medication for cholesterol management. Reinforced the importance of a healthy diet low in saturated fats and regular exercise. Lab work ordered to monitor lipid levels.      Relevant Orders   Lipid Profile    Urinalysis, Routine w reflex microscopic   Routine general medical examination at a health care facility   Memory changes    Discussed the potential link between small vessel disease and memory changes. Suggested further evaluation by Neurology to assess cognitive function and explore potential interventions for memory concerns.  Ordering lab work to rule out reversible causes.  Patient depression well-controlled on citalopram, unlikely to be cause of pseudodementia      Relevant Orders   B12 and Folate Panel   Thyroid Panel With TSH   HIV antibody (with reflex)   RPR   Ambulatory referral to Neurology   Lipid Profile   Urinalysis, Routine w reflex microscopic    There are no discontinued medications.  Patient Counseling(The following topics were reviewed and/or handout was given):  -Nutrition: Stressed importance of moderation in sodium/caffeine intake, saturated fat and cholesterol, caloric balance, sufficient intake of fresh fruits, vegetables, and fiber.  -Stressed the importance of regular exercise.   -Substance Abuse: Discussed cessation/primary prevention of tobacco, alcohol, or other drug use; driving or other dangerous activities under the influence; availability of treatment for abuse.   -Injury prevention: Discussed safety belts, safety helmets, smoke detector, smoking near bedding or upholstery.   -Sexuality: Discussed sexually transmitted diseases, partner selection, use of condoms, avoidance of unintended pregnancy and contraceptive alternatives.   -Dental health: Discussed importance of regular tooth brushing, flossing, and dental visits.  -Health maintenance and immunizations reviewed. Please refer to Health maintenance section.  Return to care in 1 year for next preventative visit.  Subjective:  Chief complaint Encounter date: 12/27/2022  Chief Complaint  Patient presents with   Annual Exam    Fasting. Has been seeing PT for right arm.    Kim Grant is a  82 y.o. female who presents today for her annual comprehensive physical exam.    Lifestyle: Diet, exercise status were discussed with the patient during the visit. Depression and anxiety screenings (GAD-7 and PHQ-9) were both scored at 0, indicating no current signs of depression or anxiety. Health Maintenance Due: Advised the patient on the zoster vaccine (Shingrix) due, as it has never been administered.     12/27/2022   10:57 AM 11/18/2020    2:04 PM  GAD-7 Generalized Anxiety Disorder Screening Tool  1. Feeling Nervous, Anxious, or on Edge 0 0  2. Not Being Able to Stop or Control Worrying 0 0  3. Worrying Too Much About Different Things 0 0  4. Trouble Relaxing 0 0  5. Being So Restless it's Hard To Sit Still 0 0  6. Becoming Easily Annoyed or Irritable 0 0  7. Feeling Afraid As If Something Awful Might Happen 0 0  Total GAD-7 Score 0 0  Difficulty At Work, Home, or Getting  Along With Others? Not difficult at all Not difficult at all      12/27/2022   10:57 AM 02/17/2022    2:21 PM 11/18/2020    2:00 PM  Depression screen PHQ 2/9  Decreased Interest 0 0 0  Down, Depressed, Hopeless 0 0 0  PHQ - 2 Score 0 0 0  Altered sleeping 0  0  Tired, decreased energy 0  0  Change in appetite 1  0  Feeling bad or failure about yourself  0  0  Trouble concentrating 0  0  Moving slowly or fidgety/restless 0  0  Suicidal thoughts 0  0  PHQ-9 Score 1  0  Difficult doing work/chores Not difficult at all  Not difficult at all    Health Maintenance Due  Topic Date Due   Zoster Vaccines- Shingrix (1 of 2) Never done   Medicare Annual Wellness (AWV)  11/14/2022    PMH:  The following were reviewed and entered/updated in epic: Past Medical History:  Diagnosis Date   Anterior tibialis tendinitis 04/18/2016   Arthritis of knee 10/19/2020   Formatting of this note might be different from the original. 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 HPI:  Complains of stopped up Bilateral ear(s). EXAM: shows cerumen impaction. PLAN: Cerumen removed with various instruments (curettes, suction) giving subjective relief.  External canals and tympanic membranes are otherwise normal.  Return as needed.   Chronic bronchitis ([place]) 08/04/2014   Closed fracture of nasal bone with routine healing 11/02/2021   Last Assessment & Plan:  Formatting of this note might be different from the original. Nasal fracture. Fell with facial trauma about 3 weeks ago.  CT of the face is reviewed and shows minimally displaced nasal fracture.  Denies any nasal obstruction. EXAM shows mild deviation of the nasal dorsum.  Subtle.  Intranasal exam is unremarkable. 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   Primary osteoarthritis of left foot  12/02/2015   Age-related osteoporosis without current pathological fracture 08/04/2014   Mixed hyperlipidemia 08/04/2014   Aortic stenosis 08/04/2014   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     calcium citrate-vitamin D (CITRACAL+D) 315-200 MG-UNIT per tablet Take 3 tablets by mouth daily.     citalopram (CELEXA) 40 MG tablet Take 1 tablet (40 mg total) by mouth daily. 90 tablet 3   denosumab (PROLIA) 60 MG/ML SOSY injection Inject 60 mg into the skin every 6 (six) months.     melatonin 5 MG TABS Take 5 mg by mouth at bedtime as needed.     Multiple Vitamin (MULTIVITAMIN WITH MINERALS) TABS tablet Take 1 tablet by mouth daily.     pantoprazole (PROTONIX) 40 MG tablet Take 40 mg by mouth 2 (two) times daily.     pravastatin (PRAVACHOL) 40 MG tablet Take 1 tablet (40 mg total) by mouth daily. 90 tablet 3   UNABLE TO FIND Med Name: prevagin (Memory) OTC     meclizine (ANTIVERT) 25 MG tablet Take 1 tablet (25 mg total) by mouth 3 (three) times daily as needed for dizziness. (Patient not taking: Reported on 09/22/2022) 15 tablet 0   No facility-administered medications prior to visit.    No Known Allergies  Social History   Socioeconomic History   Marital status: Widowed    Spouse name: Not on file   Number of children: Not on file   Years of education: Not on file   Highest education level: Not on file  Occupational History   Not on file  Tobacco Use   Smoking status: Former    Packs/day: 0.50    Years: 15.00    Total pack years: 7.50     Types: Cigarettes    Quit date: 02/28/2013    Years since quitting: 9.8    Passive exposure: Never   Smokeless tobacco: Never  Vaping Use   Vaping Use: Never used  Substance and Sexual Activity   Alcohol use: No   Drug use: No   Sexual activity: Not Currently  Other Topics Concern   Not on  file  Social History Narrative   Pt works part time at [employer] in [place] x 18 years. She is widowed, has 2 grown sons. 2 grand daughters, 1 great granddaughter who is 1yo.    Social Determinants of Health   Financial Resource Strain: Not on file  Food Insecurity: Not on file  Transportation Needs: Not on file  Physical Activity: Not on file  Stress: Not on file  Social Connections: Not on file        Objective:  Physical Exam: BP 130/82 (BP Location: Left Arm, Patient Position: Sitting, Cuff Size: Large)   Pulse 73   Temp 97.6 F (36.4 C) (Temporal)   Ht '5\' 4"'$  (1.626 m)   Wt 169 lb 6.4 oz (76.8 kg)   SpO2 97%   BMI 29.08 kg/m   Body mass index is 29.08 kg/m. Wt Readings from Last 3 Encounters:  12/27/22 169 lb 6.4 oz (76.8 kg)  09/22/22 169 lb (76.7 kg)  06/23/22 165 lb (74.8 kg)    Gen: NAD, resting comfortably CV: RRR with no murmurs appreciated Pulm: NWOB, CTAB with no crackles, wheezes, or rhonchi GI: Normal bowel sounds present. Soft, Nontender, Nondistended. MSK: no edema, cyanosis, or clubbing noted Skin: warm, dry Neuro: grossly normal, moves all extremities Psych: Normal affect and thought content      At today's visit, we discussed treatment options, associated risk and benefits, and engage in counseling as needed.  Additionally the following were reviewed: Past medical records, past medical and surgical history, family and social background, as well as relevant laboratory results, imaging findings, and specialty notes, where applicable.  This message was generated using dictation software, and as a result, it may contain unintentional typos or errors.   Nevertheless, extensive effort was made to accurately convey at the pertinent aspects of the patient visit.    There may have been are other unrelated non-urgent complaints, but due to the busy schedule and the amount of time already spent with her, time does not permit to address these issues at today's visit. Another appointment may have or has been requested to review these additional issues.   Marny Lowenstein, MD, MS

## 2022-12-27 NOTE — Assessment & Plan Note (Signed)
Discussion regarding measured hearing loss. Mentioned patient's interest in hearing aids and difficulty in obtaining an appointment for fitting. Advised patient to seek audiologist consultation and consider alternative providers for hearing aid fitting if preferred. Recommended follow-up with Audiology or other hearing aid services as needed.

## 2022-12-27 NOTE — Assessment & Plan Note (Signed)
Plan to continue with current medication for cholesterol management. Reinforced the importance of a healthy diet low in saturated fats and regular exercise. Lab work ordered to monitor lipid levels.

## 2022-12-27 NOTE — Patient Instructions (Signed)
It was pleasure to see you again today.  We are checking labs as discussed.  We are referring to [facility] for memory concerns and they will will contact you for an upcoming visit

## 2022-12-27 NOTE — Assessment & Plan Note (Signed)
Discussed the importance of weight management and strength training to support joint health. Advised patient to continue with physical therapy as prescribed and to reach out if there is a need for further intervention or referral back to Orthopedics.

## 2022-12-27 NOTE — Assessment & Plan Note (Signed)
Discussed concerns about long-term pantoprazole use. Encouraged continued use due to the risk of ulcer complications. Reinforced the need for a follow-up as recommended by Gastroenterology and to discuss any changes to proton pump inhibitor therapy.

## 2022-12-27 NOTE — Assessment & Plan Note (Signed)
Acknowledged a known atherosclerosis and reinforcement of the importance of cholesterol management to prevent progression.

## 2022-12-28 ENCOUNTER — Other Ambulatory Visit: Payer: Self-pay | Admitting: Family Medicine

## 2022-12-28 DIAGNOSIS — E782 Mixed hyperlipidemia: Secondary | ICD-10-CM

## 2022-12-28 LAB — HIV ANTIBODY (ROUTINE TESTING W REFLEX): HIV 1&2 Ab, 4th Generation: NONREACTIVE

## 2022-12-28 LAB — THYROID PANEL WITH TSH
Free Thyroxine Index: 2.2 (ref 1.4–3.8)
T3 Uptake: 27 % (ref 22–35)
T4, Total: 8.2 ug/dL (ref 5.1–11.9)
TSH: 0.76 mIU/L (ref 0.40–4.50)

## 2022-12-28 LAB — RPR: RPR Ser Ql: NONREACTIVE

## 2022-12-28 MED ORDER — ROSUVASTATIN CALCIUM 40 MG PO TABS: 40.0000 mg | ORAL_TABLET | Freq: Every day | ORAL | 3 refills | Status: DC

## 2023-01-01 ENCOUNTER — Inpatient Hospital Stay: Payer: Medicare Other

## 2023-01-01 ENCOUNTER — Inpatient Hospital Stay: Payer: Medicare Other | Attending: Hematology & Oncology

## 2023-01-01 ENCOUNTER — Inpatient Hospital Stay (HOSPITAL_BASED_OUTPATIENT_CLINIC_OR_DEPARTMENT_OTHER): Payer: Medicare Other | Admitting: Family

## 2023-01-01 VITALS — BP 112/83 | HR 81 | Temp 97.8°F | Resp 17 | Ht 64.0 in | Wt 171.0 lb

## 2023-01-01 DIAGNOSIS — D509 Iron deficiency anemia, unspecified: Secondary | ICD-10-CM

## 2023-01-01 DIAGNOSIS — D631 Anemia in chronic kidney disease: Secondary | ICD-10-CM | POA: Diagnosis not present

## 2023-01-01 DIAGNOSIS — N189 Chronic kidney disease, unspecified: Secondary | ICD-10-CM | POA: Diagnosis not present

## 2023-01-01 LAB — CBC WITH DIFFERENTIAL (CANCER CENTER ONLY)
Abs Immature Granulocytes: 0.09 10*3/uL — ABNORMAL HIGH (ref 0.00–0.07)
Basophils Absolute: 0 10*3/uL (ref 0.0–0.1)
Basophils Relative: 1 %
Eosinophils Absolute: 0.2 10*3/uL (ref 0.0–0.5)
Eosinophils Relative: 3 %
HCT: 33.4 % — ABNORMAL LOW (ref 36.0–46.0)
Hemoglobin: 10.7 g/dL — ABNORMAL LOW (ref 12.0–15.0)
Immature Granulocytes: 2 %
Lymphocytes Relative: 24 %
Lymphs Abs: 1.4 10*3/uL (ref 0.7–4.0)
MCH: 26.3 pg (ref 26.0–34.0)
MCHC: 32 g/dL (ref 30.0–36.0)
MCV: 82.1 fL (ref 80.0–100.0)
Monocytes Absolute: 0.4 10*3/uL (ref 0.1–1.0)
Monocytes Relative: 7 %
Neutro Abs: 3.9 10*3/uL (ref 1.7–7.7)
Neutrophils Relative %: 63 %
Platelet Count: 285 10*3/uL (ref 150–400)
RBC: 4.07 MIL/uL (ref 3.87–5.11)
RDW: 14.6 % (ref 11.5–15.5)
WBC Count: 6.1 10*3/uL (ref 4.0–10.5)
nRBC: 0 % (ref 0.0–0.2)

## 2023-01-01 LAB — CMP (CANCER CENTER ONLY)
ALT: 17 U/L (ref 0–44)
AST: 23 U/L (ref 15–41)
Albumin: 4.5 g/dL (ref 3.5–5.0)
Alkaline Phosphatase: 53 U/L (ref 38–126)
Anion gap: 11 (ref 5–15)
BUN: 22 mg/dL (ref 8–23)
CO2: 27 mmol/L (ref 22–32)
Calcium: 10.5 mg/dL — ABNORMAL HIGH (ref 8.9–10.3)
Chloride: 96 mmol/L — ABNORMAL LOW (ref 98–111)
Creatinine: 1.09 mg/dL — ABNORMAL HIGH (ref 0.44–1.00)
GFR, Estimated: 51 mL/min — ABNORMAL LOW (ref >60)
Glucose, Bld: 99 mg/dL (ref 70–99)
Potassium: 4.1 mmol/L (ref 3.5–5.1)
Sodium: 134 mmol/L — ABNORMAL LOW (ref 135–145)
Total Bilirubin: 0.4 mg/dL (ref 0.3–1.2)
Total Protein: 7.4 g/dL (ref 6.5–8.1)

## 2023-01-01 LAB — RETICULOCYTES
Immature Retic Fract: 2.7 % (ref 2.3–15.9)
RBC.: 4.04 MIL/uL (ref 3.87–5.11)
Retic Count, Absolute: 22.6 10*3/uL (ref 19.0–186.0)
Retic Ct Pct: 0.6 % (ref 0.4–3.1)

## 2023-01-01 LAB — FERRITIN: Ferritin: 54 ng/mL (ref 11–307)

## 2023-01-01 MED ORDER — EPOETIN ALFA-EPBX 40000 UNIT/ML IJ SOLN
40000.0000 [IU] | Freq: Once | INTRAMUSCULAR | Status: AC
  Administered 2023-01-01: 40000 [IU] via SUBCUTANEOUS
  Filled 2023-01-01: qty 1

## 2023-01-01 NOTE — Progress Notes (Signed)
Hematology and Oncology Follow Up Visit  Kim Grant [id number] [birth year] 82 y.o. 01/01/2023   Principle Diagnosis:  Erythropoietin deficiency anemia    Current Therapy:        Retacrit 40,000 units SQ for Hgb < 11               Interim History:  Ms. Kim Grant is here today for follow-up and injection. Hgb is 10.7. She is doing quite well! She has been doing PT and working out at the gym between visits. This has really helped her back pain and mobility.  She has also made some new friends and found a great church. This has been so good for her! No blood loss, bruising or petechiae noted.  No fever, chills, n/v, cough, rash, dizziness, SOB, chest pain, palpitations, abdominal pain or changes in bowel or bladder habits.  No swelling in her extremities.  No falls or syncope reported.  Appetite and hydration ar good. Weight is stable at 171 lbs.   ECOG Performance Status: 1 - Symptomatic but completely ambulatory  Medications:  Allergies as of 01/01/2023   No Known Allergies      Medication List        Accurate as of January 01, 2023  3:15 PM. If you have any questions, ask your nurse or doctor.          calcium citrate-vitamin D 315-200 MG-UNIT tablet Commonly known as: CITRACAL+D Take 3 tablets by mouth daily.   citalopram 40 MG tablet Commonly known as: CELEXA Take 1 tablet (40 mg total) by mouth daily.   denosumab 60 MG/ML Sosy injection Commonly known as: PROLIA Inject 60 mg into the skin every 6 (six) months.   meclizine 25 MG tablet Commonly known as: ANTIVERT Take 1 tablet (25 mg total) by mouth 3 (three) times daily as needed for dizziness.   melatonin 5 MG Tabs Take 5 mg by mouth at bedtime as needed.   multivitamin with minerals Tabs tablet Take 1 tablet by mouth daily.   pantoprazole 40 MG tablet Commonly known as: PROTONIX Take 40 mg by mouth 2 (two) times daily.   PREVAGEN PO Take by mouth.   rosuvastatin 40 MG tablet Commonly known as:  CRESTOR Take 1 tablet (40 mg total) by mouth daily.   UNABLE TO FIND Med Name: prevagin (Memory) OTC        Allergies: No Known Allergies  Past Medical History, Surgical history, Social history, and Family History were reviewed and updated.  Review of Systems: All other 10 point review of systems is negative.   Physical Exam:  height is '5\' 4"'$  (1.626 m) and weight is 171 lb (77.6 kg). Her oral temperature is 97.8 F (36.6 C). Her blood pressure is 112/83 and her pulse is 81. Her respiration is 17 and oxygen saturation is 98%.   Wt Readings from Last 3 Encounters:  01/01/23 171 lb (77.6 kg)  12/27/22 169 lb 6.4 oz (76.8 kg)  09/22/22 169 lb (76.7 kg)    Ocular: Sclerae unicteric, pupils equal, round and reactive to light Ear-nose-throat: Oropharynx clear, dentition fair Lymphatic: No cervical or supraclavicular adenopathy Lungs no rales or rhonchi, good excursion bilaterally Heart regular rate and rhythm, no murmur appreciated Abd soft, nontender, positive bowel sounds MSK no focal spinal tenderness, no joint edema Neuro: non-focal, well-oriented, appropriate affect Breasts: Deferred  Lab Results  Component Value Date   WBC 6.1 01/01/2023   HGB 10.7 (L) 01/01/2023   HCT 33.4 (L) 01/01/2023  MCV 82.1 01/01/2023   PLT 285 01/01/2023   Lab Results  Component Value Date   FERRITIN 62 09/22/2022   IRON 87 09/22/2022   TIBC 403 09/22/2022   UIBC 316 09/22/2022   IRONPCTSAT 22 09/22/2022   Lab Results  Component Value Date   RETICCTPCT 0.6 01/01/2023   RBC 4.07 01/01/2023   No results found for: "KPAFRELGTCHN", "LAMBDASER", "KAPLAMBRATIO" No results found for: "IGGSERUM", "IGA", "IGMSERUM" No results found for: "TOTALPROTELP", "ALBUMINELP", "A1GS", "A2GS", "BETS", "BETA2SER", "GAMS", "MSPIKE", "SPEI"   Chemistry      Component Value Date/Time   NA 134 (L) 01/01/2023 1419   K 4.1 01/01/2023 1419   CL 96 (L) 01/01/2023 1419   CO2 27 01/01/2023 1419   BUN 22  01/01/2023 1419   CREATININE 1.09 (H) 01/01/2023 1419      Component Value Date/Time   CALCIUM 10.5 (H) 01/01/2023 1419   ALKPHOS 53 01/01/2023 1419   AST 23 01/01/2023 1419   ALT 17 01/01/2023 1419   BILITOT 0.4 01/01/2023 1419       Impression and Plan: Ms. [name] is a very pleasant 82 yo caucasian female with erythropoietin deficiency anemia.  ESA given, Hgb 10.7.  Iron studies pending.  Lab check every month, follow-up in 3 months.   Lottie Dawson, NP 3/11/20243:15 PM

## 2023-01-01 NOTE — Patient Instructions (Signed)

## 2023-01-02 ENCOUNTER — Encounter: Payer: Self-pay | Admitting: Physical Therapy

## 2023-01-02 ENCOUNTER — Ambulatory Visit: Payer: Medicare Other | Admitting: Physical Therapy

## 2023-01-02 DIAGNOSIS — M6281 Muscle weakness (generalized): Secondary | ICD-10-CM

## 2023-01-02 DIAGNOSIS — M25511 Pain in right shoulder: Secondary | ICD-10-CM | POA: Diagnosis not present

## 2023-01-02 DIAGNOSIS — R262 Difficulty in walking, not elsewhere classified: Secondary | ICD-10-CM | POA: Diagnosis not present

## 2023-01-02 DIAGNOSIS — M25512 Pain in left shoulder: Secondary | ICD-10-CM | POA: Diagnosis not present

## 2023-01-02 DIAGNOSIS — R296 Repeated falls: Secondary | ICD-10-CM | POA: Diagnosis not present

## 2023-01-02 LAB — IRON AND IRON BINDING CAPACITY (CC-WL,HP ONLY)
Iron: 88 ug/dL (ref 28–170)
Saturation Ratios: 23 % (ref 10.4–31.8)
TIBC: 377 ug/dL (ref 250–450)
UIBC: 289 ug/dL (ref 148–442)

## 2023-01-02 NOTE — Therapy (Signed)
OUTPATIENT PHYSICAL THERAPY SHOULDER TREATMENT      Patient Name: Kim Grant MRN: [id number] DOB:08-07-1941, 82 y.o., female Today's Date: 01/02/2023   PT End of Session - 01/02/23 1059     Visit Number 28    Date for PT Re-Evaluation 01/25/23    Authorization Type [employer] medicare    PT Start Time 1059    PT Stop Time 1144    PT Time Calculation (min) 45 min    Activity Tolerance Patient tolerated treatment well    Behavior During Therapy [facility] for tasks assessed/performed             Past Medical History:  Diagnosis Date   Anterior tibialis tendinitis 04/18/2016   Arthritis of knee 10/19/2020   Formatting of this note might be different from the original. Added automatically from request for surgery [id number]   Asymptomatic postprocedural ovarian failure 08/04/2014   Bilateral impacted cerumen 05/13/2020   Last Assessment & Plan:  Formatting of this note might be different from the original. HPI:  Complains of stopped up Bilateral ear(s). EXAM: shows cerumen impaction. PLAN: Cerumen removed with various instruments (curettes, suction) giving subjective relief.  External canals and tympanic membranes are otherwise normal.  Return as needed.   Chronic bronchitis ([place]) 08/04/2014   Closed fracture of nasal bone with routine healing 11/02/2021   Last Assessment & Plan:  Formatting of this note might be different from the original. Nasal fracture. Fell with facial trauma about 3 weeks ago.  CT of the face is reviewed and shows minimally displaced nasal fracture.  Denies any nasal obstruction. EXAM shows mild deviation of the nasal dorsum.  Subtle.  Intranasal exam is unremarkable. PLAN: Reassured I think everything is going to be okay with   Depression    DJD (degenerative joint disease), ankle and foot 01/10/2016   Fall 01/20/2017   Fatigue 02/04/2018   Fracture of surgical neck of right humerus 01/20/2017   Hyperlipemia    Mammographic microcalcification found on diagnostic imaging of  breast 08/04/2014   Osteoporosis    Tear of left rotator cuff 07/26/2018   Formatting of this note might be different from the original. 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   Barrett's esophagus without dysplasia 11/25/2018   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[place]) 05/25/2017   Primary osteoarthritis of left foot 12/02/2015   Age-related osteoporosis without current pathological fracture 08/04/2014   Mixed hyperlipidemia 08/04/2014   Aortic stenosis 08/04/2014   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/2014   Primary osteoarthritis of right knee 06/08/2014    PCP: Josephine Igo  REFERRING PROVIDER: Leslye Peer DIAG: bilateral shoulder pain  THERAPY DIAG:  Acute pain of right shoulder  Difficulty in walking, not elsewhere classified  Muscle weakness (generalized)  Rationale for Evaluation and Treatment Rehabilitation  ONSET DATE: September 2023  SUBJECTIVE:  SUBJECTIVE STATEMENT: Reports fatigue, reports walking better PERTINENT HISTORY: Right shoulder humeral fracture and left shoulder RC repair  PAIN:  Are you having pain? Yes: NPRS scale: 3/10 Pain location: bilateral shoulders  Pain description: sharp,  ache Aggravating factors: reaching up, out pain up to 6/10 Relieving factors: rest, OTC pain meds pain at best a 3/10  PRECAUTIONS: None  WEIGHT BEARING RESTRICTIONS: No  FALLS:  Has patient fallen in last 6 months? No  LIVING ENVIRONMENT: Lives with: lives with their family Lives in: House/apartment Stairs: No Has following equipment at home: None  OCCUPATION: retired  PLOF: Independent and gym 2x/week reports not much walking due to "very poor stamina"  PATIENT GOALS:less pain, stronger, take less pain meds, do hair, reach easier  OBJECTIVE:   DIAGNOSTIC FINDINGS:  None recently  PATIENT SURVEYS:  FOTO 36  COGNITION: Overall cognitive status: Within functional limits for tasks assessed     SENSATION: WFL  POSTURE: Fwd head, rounded shoulders  UPPER EXTREMITY ROM:   Active ROM Right eval Left eval Right AROM 10/02/22 Right AROM 10/31/22  Right AROM 11/14/22 Right AROM  12/01/22 Right AROM 12/21/22  Shoulder flexion 80 100 100 110 115 120 127  Shoulder extension         Shoulder abduction 70 60 80 87 90  104  Shoulder adduction         Shoulder internal rotation 30 10     45  Shoulder external rotation 40 50 50 55   62  Elbow flexion         Elbow extension         Wrist flexion         Wrist extension         Wrist ulnar deviation         Wrist radial deviation         Wrist pronation         Wrist supination         (Blank rows = not tested)  UPPER EXTREMITY MMT:  MMT Right eval Left eval Right 12/25/22  Shoulder flexion 3- 3- 3+  Shoulder extension     Shoulder abduction 3- 3- 3+  Shoulder adduction     Shoulder internal rotation 3+ 3+ 4-  Shoulder external rotation 3 3 4-  Middle trapezius     Lower trapezius     Elbow flexion 4- 4-   Elbow extension 4- 4-   Wrist flexion     Wrist extension     Wrist ulnar deviation     Wrist radial deviation     Wrist pronation     Wrist supination     Grip strength (lbs)     (Blank rows = not  tested)  SHOULDER SPECIAL TESTS: Impingement tests: Neer impingement test: positive    PALPATION:  Very tight in the upper trap, neck, very tender here and in the upper arms and biceps   TODAY'S TREATMENT:  DATE: 01/02/23 Bike level 4 x 6 minutes UBE level 4 x 4 minutes 10# straight arm pulls 2# wate bar flexion, extension and IR Body blade isometrics right shoulder all motions needed help 5# hip abduction and extension 2x10 Use of 6" step to get up to the pull up bar and used this to hang with her feet on the ground by leaning to get a better stretch Supine stretches and reaches  12/25/22 Nustep level 5 x 6 minutes 10# straight arm pulls 2x10 25# triceps 2x10 5# biceps 2x10 5# hips abduction, extension and fleixon Doorway stretch 3# bar overhead carry 3# and 1# cabinet reaching PROM, joint mobs of the right shoulder  12/21/22 UBE level 4 x 4 minutes Bike level 4 x 4 minutes with 3 power bursts 3# wate bar overhead carry Wate bar finger ladder reach, wate bar extension On airex ball toss Ball roll on wall Ball vs wall Supine 5# punches, 3# Flexion, 3# ER/IR  12/19/22 Bike LEvel 4 x 5 minutes 3 power bursts Nustep level 5 x 4 minutes Serratus rolls 10# straight arm pulls 40# resisted gait all directions 3# wate bar overhead carry 2 laps Kneel on bosu and use the mat table to get up from their Yellow tband ER 2x10 Yellow tband small abduction Yellow tband horizontal abduction Volleyball Passive stretch right shoulder supine  12/14/22 UBE level 4 x 5 minutes 2# UE circuit Red tband horizontal abduction Red tband ER Wall slides and circles Tried W backs with PT overpressure Ball vs wall 2 pointes Supine 3# punches, isometric circles PROM, joint distraction  12/12/22 UBE level 4 x 5 minutes Airex balance beam tandem gait and side  stepping Cone toe touches 5# hip abduction, flexion and extension 2# UE circuit 2 rounds 2 and 3# cabinet reaching Supine 3# isometric circles, small arc flexion, ER/IR Ball toss 3# extension, flexion with the bar  12/05/22 Nustep level 5 x 6 minutes Resisted gait all directions  Seated row 15# 2x10 Lats 15# 2x10 Chest press 5# x10, 10# x8 5# hip abduction and extension Passive stretch to the Shoulder right to end ranges  12/01/22 UBE level 4 x 6 mintues 2# wate bar up the wall flexion, extension and IR Side step on and off airex On airex ball toss, head turns, eyes closed Ball vs wall PROM of the right shoulder Measured flexion actively to 120 degrees STM to the right deltoid and the upper trap Asked her to add AAROM with both hands in sitting, reclined and in supine  11/23/22 UBE level 4 x 6 mintues 2# wate bar reaching up the finger ladder and eccentric down Wall slides with 1#, circles and scaption Ball throwing Ball pass around waist 2# wate bar extension and then IR 10# chest press x 5, then 5# x 8 Yellow tband ER Yellow tband abduction arms at side Supine 2# ER/IR Supine 2# flexion 3# punches and isometric circles STM to the right rhomboid and upper traps  PATIENT EDUCATION: Education details: POC Person educated: Patient Education method: Explanation Education comprehension: verbalized understanding  HOME EXERCISE PROGRAM: Doorway stretch   ASSESSMENT:  CLINICAL IMPRESSION: Patient struggles with ER and flexion.  She is improving and showing easier motions.  She does have issues with breathing at times.  We work on some balance and LE strength to help with her overall function  OBJECTIVE IMPAIRMENTS: decreased balance, decreased endurance, decreased ROM, decreased strength, increased muscle spasms, impaired flexibility, impaired UE functional use, postural dysfunction, and pain.  REHAB POTENTIAL: Good  CLINICAL DECISION MAKING:  Stable/uncomplicated  EVALUATION COMPLEXITY: Low   GOALS: Goals reviewed with patient? Yes  SHORT TERM GOALS: Target date: 08/31/22  Independent with initial HEP Goal status: met  LONG TERM GOALS: Target date: 11/09/22  Independent with advanced HEP Goal status: progressing  2.  Decrease pain overall 50% Goal status: progressing  3.  Report no difficulty doing hair Goal status: met  4.  Report able to cook Thanksgiving dinner without diffiucty Goal status: met  5.  Increase AROM of the shoulders by 20 degrees for all motions Goal status: met  PLAN:  PT FREQUENCY: 1-2x/week  PT DURATION: 12 weeks  PLANNED INTERVENTIONS: Therapeutic exercises, Therapeutic activity, Neuromuscular re-education, Balance training, Gait training, Patient/Family education, Self Care, Joint mobilization, Joint manipulation, Dry Needling, Electrical stimulation, Cryotherapy, Moist heat, Taping, Vasopneumatic device, and Manual therapy  PLAN FOR NEXT SESSION:  measure  Neely Kammerer W, PT 01/02/2023, 11:01 AM

## 2023-01-04 ENCOUNTER — Encounter: Payer: Self-pay | Admitting: Physical Therapy

## 2023-01-04 ENCOUNTER — Ambulatory Visit: Payer: Medicare Other | Admitting: Physical Therapy

## 2023-01-04 DIAGNOSIS — M25511 Pain in right shoulder: Secondary | ICD-10-CM

## 2023-01-04 DIAGNOSIS — M6281 Muscle weakness (generalized): Secondary | ICD-10-CM | POA: Diagnosis not present

## 2023-01-04 DIAGNOSIS — M25512 Pain in left shoulder: Secondary | ICD-10-CM

## 2023-01-04 DIAGNOSIS — R296 Repeated falls: Secondary | ICD-10-CM | POA: Diagnosis not present

## 2023-01-04 DIAGNOSIS — R262 Difficulty in walking, not elsewhere classified: Secondary | ICD-10-CM

## 2023-01-04 NOTE — Therapy (Signed)
OUTPATIENT PHYSICAL THERAPY SHOULDER TREATMENT      Patient Name: Kim Grant MRN: [id number] DOB:1940-12-07, 82 y.o., female Today's Date: 01/04/2023   PT End of Session - 01/04/23 1105     Visit Number 29    Date for PT Re-Evaluation 01/25/23    Authorization Type [employer] medicare    PT Start Time 1100    PT Stop Time 1149    PT Time Calculation (min) 49 min    Activity Tolerance Patient tolerated treatment well    Behavior During Therapy WFL for tasks assessed/performed             Past Medical History:  Diagnosis Date   Anterior tibialis tendinitis 04/18/2016   Arthritis of knee 10/19/2020   Formatting of this note might be different from the original. Added automatically from request for surgery [id number]   Asymptomatic postprocedural ovarian failure 08/04/2014   Bilateral impacted cerumen 05/13/2020   Last Assessment & Plan:  Formatting of this note might be different from the original. HPI:  Complains of stopped up Bilateral ear(s). EXAM: shows cerumen impaction. PLAN: Cerumen removed with various instruments (curettes, suction) giving subjective relief.  External canals and tympanic membranes are otherwise normal.  Return as needed.   Chronic bronchitis ([place]) 08/04/2014   Closed fracture of nasal bone with routine healing 11/02/2021   Last Assessment & Plan:  Formatting of this note might be different from the original. Nasal fracture. Fell with facial trauma about 3 weeks ago.  CT of the face is reviewed and shows minimally displaced nasal fracture.  Denies any nasal obstruction. EXAM shows mild deviation of the nasal dorsum.  Subtle.  Intranasal exam is unremarkable. PLAN: Reassured I think everything is going to be okay with   Depression    DJD (degenerative joint disease), ankle and foot 01/10/2016   Fall 01/20/2017   Fatigue 02/04/2018   Fracture of surgical neck of right humerus 01/20/2017   Hyperlipemia    Mammographic microcalcification found on diagnostic imaging of  breast 08/04/2014   Osteoporosis    Tear of left rotator cuff 07/26/2018   Formatting of this note might be different from the original. 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   Barrett's esophagus without dysplasia 11/25/2018   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[place]) 05/25/2017   Primary osteoarthritis of left foot 12/02/2015   Age-related osteoporosis without current pathological fracture 08/04/2014   Mixed hyperlipidemia 08/04/2014   Aortic stenosis 08/04/2014   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/2014   Primary osteoarthritis of right knee 06/08/2014    PCP: Josephine Igo  REFERRING PROVIDER: Leslye Peer DIAG: bilateral shoulder pain  THERAPY DIAG:  Acute pain of right shoulder  Difficulty in walking, not elsewhere classified  Muscle weakness (generalized)  Acute pain of left shoulder  Repeated falls  Rationale for Evaluation and Treatment Rehabilitation  ONSET DATE: September 2023  SUBJECTIVE:  SUBJECTIVE STATEMENT: Reports that she was very tired after the last visit but thought it was good. PERTINENT HISTORY: Right shoulder humeral fracture and left shoulder RC repair  PAIN:  Are you having pain? Yes: NPRS  scale: 3/10 Pain location: bilateral shoulders  Pain description: sharp, ache Aggravating factors: reaching up, out pain up to 6/10 Relieving factors: rest, OTC pain meds pain at best a 3/10  PRECAUTIONS: None  WEIGHT BEARING RESTRICTIONS: No  FALLS:  Has patient fallen in last 6 months? No  LIVING ENVIRONMENT: Lives with: lives with their family Lives in: House/apartment Stairs: No Has following equipment at home: None  OCCUPATION: retired  PLOF: Independent and gym 2x/week reports not much walking due to "very poor stamina"  PATIENT GOALS:less pain, stronger, take less pain meds, do hair, reach easier  OBJECTIVE:   DIAGNOSTIC FINDINGS:  None recently  PATIENT SURVEYS:  FOTO 36  COGNITION: Overall cognitive status: Within functional limits for tasks assessed     SENSATION: WFL  POSTURE: Fwd head, rounded shoulders  UPPER EXTREMITY ROM:   Active ROM Right eval Left eval Right AROM 10/02/22 Right AROM 10/31/22  Right AROM 11/14/22 Right AROM  12/01/22 Right AROM 12/21/22  Shoulder flexion 80 100 100 110 115 120 127  Shoulder extension         Shoulder abduction 70 60 80 87 90  104  Shoulder adduction         Shoulder internal rotation 30 10     45  Shoulder external rotation 40 50 50 55   62  Elbow flexion         Elbow extension         Wrist flexion         Wrist extension         Wrist ulnar deviation         Wrist radial deviation         Wrist pronation         Wrist supination         (Blank rows = not tested)  UPPER EXTREMITY MMT:  MMT Right eval Left eval Right 12/25/22  Shoulder flexion 3- 3- 3+  Shoulder extension     Shoulder abduction 3- 3- 3+  Shoulder adduction     Shoulder internal rotation 3+ 3+ 4-  Shoulder external rotation 3 3 4-  Middle trapezius     Lower trapezius     Elbow flexion 4- 4-   Elbow extension 4- 4-   Wrist flexion     Wrist extension     Wrist ulnar deviation     Wrist radial deviation     Wrist  pronation     Wrist supination     Grip strength (lbs)     (Blank rows = not tested)  SHOULDER SPECIAL TESTS: Impingement tests: Neer impingement test: positive    PALPATION:  Very tight in the upper trap, neck, very tender here and in the upper arms and biceps   TODAY'S TREATMENT:  DATE: 01/04/23 Nustep level 5 x 6 minutes Stretch of the left shoulder flexion using the pull up bar On bosu balancing Airex balance beam tandem and side stepping Leg press 50# 2x10 10# straight arm pulls with her standing on airex 5# chest press 3# wate bar overhead carry Red tband ER/IR Passive stretch right shoulder and joint mobs AROM flexion 130, abduction 110  01/02/23 Bike level 4 x 6 minutes UBE level 4 x 4 minutes 10# straight arm pulls 2# wate bar flexion, extension and IR Body blade isometrics right shoulder all motions needed help 5# hip abduction and extension 2x10 Use of 6" step to get up to the pull up bar and used this to hang with her feet on the ground by leaning to get a better stretch Supine stretches and reaches  12/25/22 Nustep level 5 x 6 minutes 10# straight arm pulls 2x10 25# triceps 2x10 5# biceps 2x10 5# hips abduction, extension and fleixon Doorway stretch 3# bar overhead carry 3# and 1# cabinet reaching PROM, joint mobs of the right shoulder  12/21/22 UBE level 4 x 4 minutes Bike level 4 x 4 minutes with 3 power bursts 3# wate bar overhead carry Wate bar finger ladder reach, wate bar extension On airex ball toss Ball roll on wall Ball vs wall Supine 5# punches, 3# Flexion, 3# ER/IR  12/19/22 Bike LEvel 4 x 5 minutes 3 power bursts Nustep level 5 x 4 minutes Serratus rolls 10# straight arm pulls 40# resisted gait all directions 3# wate bar overhead carry 2 laps Kneel on bosu and use the mat table to get up from their Yellow tband ER  2x10 Yellow tband small abduction Yellow tband horizontal abduction Volleyball Passive stretch right shoulder supine  12/14/22 UBE level 4 x 5 minutes 2# UE circuit Red tband horizontal abduction Red tband ER Wall slides and circles Tried W backs with PT overpressure Ball vs wall 2 pointes Supine 3# punches, isometric circles PROM, joint distraction  12/12/22 UBE level 4 x 5 minutes Airex balance beam tandem gait and side stepping Cone toe touches 5# hip abduction, flexion and extension 2# UE circuit 2 rounds 2 and 3# cabinet reaching Supine 3# isometric circles, small arc flexion, ER/IR Ball toss 3# extension, flexion with the bar  PATIENT EDUCATION: Education details: POC Person educated: Patient Education method: Explanation Education comprehension: verbalized understanding  HOME EXERCISE PROGRAM: Doorway stretch   ASSESSMENT:  CLINICAL IMPRESSION: Patient not sore after the last treatment but reports very tired, she has been responding well to the exercises and stretches, she still has ROM limitations Actively but doing well passively.  She did have some significant right shoulder crepitus with horizontal abduction with red tband so we stopped this after about 3 reps.  Stil lprogressing with her ROM  OBJECTIVE IMPAIRMENTS: decreased balance, decreased endurance, decreased ROM, decreased strength, increased muscle spasms, impaired flexibility, impaired UE functional use, postural dysfunction, and pain.   REHAB POTENTIAL: Good  CLINICAL DECISION MAKING: Stable/uncomplicated  EVALUATION COMPLEXITY: Low   GOALS: Goals reviewed with patient? Yes  SHORT TERM GOALS: Target date: 08/31/22  Independent with initial HEP Goal status: met  LONG TERM GOALS: Target date: 11/09/22  Independent with advanced HEP Goal status: progressing  2.  Decrease pain overall 50% Goal status: progressing  3.  Report no difficulty doing hair Goal status: met  4.  Report able  to cook Thanksgiving dinner without diffiucty Goal status: met  5.  Increase AROM of the shoulders by 20  degrees for all motions Goal status: met  PLAN:  PT FREQUENCY: 1-2x/week  PT DURATION: 12 weeks  PLANNED INTERVENTIONS: Therapeutic exercises, Therapeutic activity, Neuromuscular re-education, Balance training, Gait training, Patient/Family education, Self Care, Joint mobilization, Joint manipulation, Dry Needling, Electrical stimulation, Cryotherapy, Moist heat, Taping, Vasopneumatic device, and Manual therapy  PLAN FOR NEXT SESSION:  will write a progress note next visit, will finalize HEP and gym over the next two weeks  Lochlyn Zullo W, PT 01/04/2023, 11:05 AM

## 2023-01-08 ENCOUNTER — Ambulatory Visit: Payer: Medicare Other | Admitting: Physical Therapy

## 2023-01-08 ENCOUNTER — Encounter: Payer: Self-pay | Admitting: Physical Therapy

## 2023-01-08 DIAGNOSIS — M25511 Pain in right shoulder: Secondary | ICD-10-CM

## 2023-01-08 DIAGNOSIS — M6281 Muscle weakness (generalized): Secondary | ICD-10-CM | POA: Diagnosis not present

## 2023-01-08 DIAGNOSIS — R296 Repeated falls: Secondary | ICD-10-CM

## 2023-01-08 DIAGNOSIS — M25512 Pain in left shoulder: Secondary | ICD-10-CM | POA: Diagnosis not present

## 2023-01-08 DIAGNOSIS — R262 Difficulty in walking, not elsewhere classified: Secondary | ICD-10-CM | POA: Diagnosis not present

## 2023-01-08 NOTE — Therapy (Signed)
OUTPATIENT PHYSICAL THERAPY SHOULDER TREATMENT  Progress Note Reporting Period 12/01/22 to 01/08/23 for visit 21-30  See note below for Objective Data and Assessment of Progress/Goals.        Patient Name: Kim Grant MRN: [id number] DOB:[birth year], 82 y.o., female Today's Date: 01/08/2023   PT End of Session - 01/08/23 1412     Visit Number 30    Date for PT Re-Evaluation 01/25/23    Authorization Type [employer]BS medicare    PT Start Time 1400    PT Stop Time 1445    PT Time Calculation (min) 45 min    Activity Tolerance Patient tolerated treatment well    Behavior During Therapy [facility] for tasks assessed/performed             Past Medical History:  Diagnosis Date   Anterior tibialis tendinitis 04/18/2016   Arthritis of knee 10/19/2020   Formatting of this note might be different from the original. Added automatically from request for surgery [id number]   Asymptomatic postprocedural ovarian failure 08/04/2014   Bilateral impacted cerumen 05/13/2020   Last Assessment & Plan:  Formatting of this note might be different from the original. HPI:  Complains of stopped up Bilateral ear(s). EXAM: shows cerumen impaction. PLAN: Cerumen removed with various instruments (curettes, suction) giving subjective relief.  External canals and tympanic membranes are otherwise normal.  Return as needed.   Chronic bronchitis ([place]) 08/04/2014   Closed fracture of nasal bone with routine healing 11/02/2021   Last Assessment & Plan:  Formatting of this note might be different from the original. Nasal fracture. Fell with facial trauma about 3 weeks ago.  CT of the face is reviewed and shows minimally displaced nasal fracture.  Denies any nasal obstruction. EXAM shows mild deviation of the nasal dorsum.  Subtle.  Intranasal exam is unremarkable. PLAN: Reassured I think everything is going to be okay with   Depression    DJD (degenerative joint disease), ankle and foot 01/10/2016   Fall 01/20/2017   Fatigue 02/04/2018    Fracture of surgical neck of right humerus 01/20/2017   Hyperlipemia    Mammographic microcalcification found on diagnostic imaging of breast 08/04/2014   Osteoporosis    Tear of left rotator cuff 07/26/2018   Formatting of this note might be different from the original. 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   Barrett's esophagus without dysplasia 11/25/2018   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[place]) 05/25/2017   Primary osteoarthritis of left foot 12/02/2015   Age-related osteoporosis without current pathological fracture 08/04/2014   Mixed hyperlipidemia 08/04/2014   Aortic stenosis 08/04/2014   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/2014   Primary osteoarthritis of right knee 06/08/2014    PCP: Josephine Igo  REFERRING PROVIDER: Leslye Peer DIAG: bilateral shoulder pain  THERAPY DIAG:  Acute pain of right shoulder  Difficulty in walking, not elsewhere classified  Muscle weakness (generalized)  Acute  pain of left shoulder  Repeated falls  Rationale for Evaluation and Treatment Rehabilitation  ONSET DATE: September 2023  SUBJECTIVE:                                                                                                                                                                                      SUBJECTIVE STATEMENT: Reports that she is not feeling well today, "Just feels off", O2  saturation 98%, BP 124/74, HR 80 PERTINENT HISTORY: Right shoulder humeral fracture and left shoulder RC repair  PAIN:  Are you having pain? Yes: NPRS scale: 3/10 Pain location: bilateral shoulders  Pain description: sharp, ache Aggravating factors: reaching up, out pain up to 6/10 Relieving factors: rest, OTC pain meds pain at best a 3/10  PRECAUTIONS: None  WEIGHT BEARING RESTRICTIONS: No  FALLS:  Has patient fallen in last 6 months? No  LIVING ENVIRONMENT: Lives with: lives with their family Lives in: House/apartment Stairs: No Has following equipment at home: None  OCCUPATION: retired  PLOF: Independent and gym 2x/week reports not much walking due to "very poor stamina"  PATIENT GOALS:less pain, stronger, take less pain meds, do hair, reach easier  OBJECTIVE:   DIAGNOSTIC FINDINGS:  None recently  PATIENT SURVEYS:  FOTO 36  COGNITION: Overall cognitive status: Within functional limits for tasks assessed     SENSATION: WFL  POSTURE: Fwd head, rounded shoulders  UPPER EXTREMITY ROM:   Active ROM Right eval Left eval Right AROM 10/02/22 Right AROM 10/31/22  Right AROM 11/14/22 Right AROM  12/01/22 Right AROM 12/21/22  Shoulder flexion 80 100 100 110 115 120 127  Shoulder extension         Shoulder abduction 70 60 80 87 90  104  Shoulder adduction         Shoulder internal rotation 30 10     45  Shoulder external rotation 40 50 50 55   62  Elbow flexion         Elbow extension         Wrist flexion         Wrist extension         Wrist ulnar deviation         Wrist radial deviation         Wrist pronation         Wrist supination         (Blank rows = not tested)  UPPER EXTREMITY MMT:  MMT Right eval Left eval Right 12/25/22  Shoulder flexion 3- 3- 3+  Shoulder extension     Shoulder abduction 3- 3- 3+  Shoulder adduction     Shoulder internal  rotation 3+ 3+ 4-  Shoulder external rotation 3 3 4-  Middle trapezius     Lower trapezius      Elbow flexion 4- 4-   Elbow extension 4- 4-   Wrist flexion     Wrist extension     Wrist ulnar deviation     Wrist radial deviation     Wrist pronation     Wrist supination     Grip strength (lbs)     (Blank rows = not tested)  SHOULDER SPECIAL TESTS: Impingement tests: Neer impingement test: positive    PALPATION:  Very tight in the upper trap, neck, very tender here and in the upper arms and biceps   TODAY'S TREATMENT:                                                                                                                           DATE: 01/08/23 UBE level 4.5 x 6 minutes Doorway stretch 20# rows 20# lats 3# bar reach on finger ladder 3# ER bar IR and extension Supine 3# punches, ER/IR and flexion PROM to the right shoulder all motions STM to the rhomboid area  01/04/23 Nustep level 5 x 6 minutes Stretch of the left shoulder flexion using the pull up bar On bosu balancing Airex balance beam tandem and side stepping Leg press 50# 2x10 10# straight arm pulls with her standing on airex 5# chest press 3# wate bar overhead carry Red tband ER/IR Passive stretch right shoulder and joint mobs AROM flexion 130, abduction 110  01/02/23 Bike level 4 x 6 minutes UBE level 4 x 4 minutes 10# straight arm pulls 2# wate bar flexion, extension and IR Body blade isometrics right shoulder all motions needed help 5# hip abduction and extension 2x10 Use of 6" step to get up to the pull up bar and used this to hang with her feet on the ground by leaning to get a better stretch Supine stretches and reaches  12/25/22 Nustep level 5 x 6 minutes 10# straight arm pulls 2x10 25# triceps 2x10 5# biceps 2x10 5# hips abduction, extension and fleixon Doorway stretch 3# bar overhead carry 3# and 1# cabinet reaching PROM, joint mobs of the right shoulder  12/21/22 UBE level 4 x 4 minutes Bike level 4 x 4 minutes with 3 power bursts 3# wate bar overhead carry Wate bar finger  ladder reach, wate bar extension On airex ball toss Ball roll on wall Ball vs wall Supine 5# punches, 3# Flexion, 3# ER/IR  12/19/22 Bike LEvel 4 x 5 minutes 3 power bursts Nustep level 5 x 4 minutes Serratus rolls 10# straight arm pulls 40# resisted gait all directions 3# wate bar overhead carry 2 laps Kneel on bosu and use the mat table to get up from their Yellow tband ER 2x10 Yellow tband small abduction Yellow tband horizontal abduction Volleyball Passive stretch right shoulder supine  PATIENT EDUCATION: Education details: POC Person educated: Patient Education method: Electronics engineer  comprehension: verbalized understanding  HOME EXERCISE PROGRAM: Doorway stretch   ASSESSMENT:  CLINICAL IMPRESSION: Patient reports not feeling well today, all of her vitals were WNL's and once we started she reported feeling fine, just very tight in ER, showed her how to do the stargazer stretch OBJECTIVE IMPAIRMENTS: decreased balance, decreased endurance, decreased ROM, decreased strength, increased muscle spasms, impaired flexibility, impaired UE functional use, postural dysfunction, and pain.   REHAB POTENTIAL: Good  CLINICAL DECISION MAKING: Stable/uncomplicated  EVALUATION COMPLEXITY: Low   GOALS: Goals reviewed with patient? Yes  SHORT TERM GOALS: Target date: 08/31/22  Independent with initial HEP Goal status: met  LONG TERM GOALS: Target date: 11/09/22  Independent with advanced HEP Goal status: progressing  2.  Decrease pain overall 50% Goal status: progressing  3.  Report no difficulty doing hair Goal status: met  4.  Report able to cook Thanksgiving dinner without diffiucty Goal status: met  5.  Increase AROM of the shoulders by 20 degrees for all motions Goal status: met  PLAN:  PT FREQUENCY: 1-2x/week  PT DURATION: 12 weeks  PLANNED INTERVENTIONS: Therapeutic exercises, Therapeutic activity, Neuromuscular re-education, Balance training, Gait  training, Patient/Family education, Self Care, Joint mobilization, Joint manipulation, Dry Needling, Electrical stimulation, Cryotherapy, Moist heat, Taping, Vasopneumatic device, and Manual therapy  PLAN FOR NEXT SESSION:  continue to look at what sh can do on own at home [place], PT 01/08/2023, 2:13 PM

## 2023-01-11 ENCOUNTER — Encounter: Payer: Self-pay | Admitting: Physical Therapy

## 2023-01-11 ENCOUNTER — Ambulatory Visit: Payer: Medicare Other | Admitting: Physical Therapy

## 2023-01-11 DIAGNOSIS — R262 Difficulty in walking, not elsewhere classified: Secondary | ICD-10-CM | POA: Diagnosis not present

## 2023-01-11 DIAGNOSIS — M6281 Muscle weakness (generalized): Secondary | ICD-10-CM | POA: Diagnosis not present

## 2023-01-11 DIAGNOSIS — M25512 Pain in left shoulder: Secondary | ICD-10-CM

## 2023-01-11 DIAGNOSIS — M25511 Pain in right shoulder: Secondary | ICD-10-CM | POA: Diagnosis not present

## 2023-01-11 DIAGNOSIS — R296 Repeated falls: Secondary | ICD-10-CM | POA: Diagnosis not present

## 2023-01-11 NOTE — Therapy (Signed)
OUTPATIENT PHYSICAL THERAPY SHOULDER TREATMENT      Patient Name: Kim Grant MRN: [id number] DOB:May 23, 1941, 82 y.o., female Today's Date: 01/11/2023   PT End of Session - 01/11/23 1408     Visit Number 31    Date for PT Re-Evaluation 01/25/23    Authorization Type [employer]BS medicare    PT Start Time 1406    PT Stop Time 1447    PT Time Calculation (min) 41 min    Activity Tolerance Patient tolerated treatment well    Behavior During Therapy [facility] for tasks assessed/performed             Past Medical History:  Diagnosis Date   Anterior tibialis tendinitis 04/18/2016   Arthritis of knee 10/19/2020   Formatting of this note might be different from the original. Added automatically from request for surgery [id number]   Asymptomatic postprocedural ovarian failure 08/04/2014   Bilateral impacted cerumen 05/13/2020   Last Assessment & Plan:  Formatting of this note might be different from the original. HPI:  Complains of stopped up Bilateral ear(s). EXAM: shows cerumen impaction. PLAN: Cerumen removed with various instruments (curettes, suction) giving subjective relief.  External canals and tympanic membranes are otherwise normal.  Return as needed.   Chronic bronchitis ([place]) 08/04/2014   Closed fracture of nasal bone with routine healing 11/02/2021   Last Assessment & Plan:  Formatting of this note might be different from the original. Nasal fracture. Fell with facial trauma about 3 weeks ago.  CT of the face is reviewed and shows minimally displaced nasal fracture.  Denies any nasal obstruction. EXAM shows mild deviation of the nasal dorsum.  Subtle.  Intranasal exam is unremarkable. PLAN: Reassured I think everything is going to be okay with   Depression    DJD (degenerative joint disease), ankle and foot 01/10/2016   Fall 01/20/2017   Fatigue 02/04/2018   Fracture of surgical neck of right humerus 01/20/2017   Hyperlipemia    Mammographic microcalcification found on diagnostic imaging of  breast 08/04/2014   Osteoporosis    Tear of left rotator cuff 07/26/2018   Formatting of this note might be different from the original. 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   Barrett's esophagus without dysplasia 11/25/2018   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[place]) 05/25/2017   Primary osteoarthritis of left foot 12/02/2015   Age-related osteoporosis without current pathological fracture 08/04/2014   Mixed hyperlipidemia 08/04/2014   Aortic stenosis 08/04/2014   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/2014   Primary osteoarthritis of right knee 06/08/2014    PCP: Josephine Igo  REFERRING PROVIDER: Leslye Peer DIAG: bilateral shoulder pain  THERAPY DIAG:  Acute pain of right shoulder  Difficulty in walking, not elsewhere classified  Muscle weakness (generalized)  Acute pain of left shoulder  Rationale for Evaluation and Treatment Rehabilitation  ONSET DATE: September 2023  SUBJECTIVE:  SUBJECTIVE STATEMENT: Reports that she thinks the last visit she was low on "sugar", "feel good today" PERTINENT HISTORY: Right shoulder humeral fracture and left shoulder RC repair  PAIN:  Are you having pain? Yes: NPRS scale: 2/10 Pain  location: bilateral shoulders  Pain description: sharp, ache Aggravating factors: reaching up, out pain up to 6/10 Relieving factors: rest, OTC pain meds pain at best a 3/10  PRECAUTIONS: None  WEIGHT BEARING RESTRICTIONS: No  FALLS:  Has patient fallen in last 6 months? No  LIVING ENVIRONMENT: Lives with: lives with their family Lives in: House/apartment Stairs: No Has following equipment at home: None  OCCUPATION: retired  PLOF: Independent and gym 2x/week reports not much walking due to "very poor stamina"  PATIENT GOALS:less pain, stronger, take less pain meds, do hair, reach easier  OBJECTIVE:   DIAGNOSTIC FINDINGS:  None recently  PATIENT SURVEYS:  FOTO 36  COGNITION: Overall cognitive status: Within functional limits for tasks assessed     SENSATION: WFL  POSTURE: Fwd head, rounded shoulders  UPPER EXTREMITY ROM:   Active ROM Right eval Left eval Right AROM 10/02/22 Right AROM 10/31/22  Right AROM 11/14/22 Right AROM  12/01/22 Right AROM 12/21/22  Shoulder flexion 80 100 100 110 115 120 127  Shoulder extension         Shoulder abduction 70 60 80 87 90  104  Shoulder adduction         Shoulder internal rotation 30 10     45  Shoulder external rotation 40 50 50 55   62  Elbow flexion         Elbow extension         Wrist flexion         Wrist extension         Wrist ulnar deviation         Wrist radial deviation         Wrist pronation         Wrist supination         (Blank rows = not tested)  UPPER EXTREMITY MMT:  MMT Right eval Left eval Right 12/25/22  Shoulder flexion 3- 3- 3+  Shoulder extension     Shoulder abduction 3- 3- 3+  Shoulder adduction     Shoulder internal rotation 3+ 3+ 4-  Shoulder external rotation 3 3 4-  Middle trapezius     Lower trapezius     Elbow flexion 4- 4-   Elbow extension 4- 4-   Wrist flexion     Wrist extension     Wrist ulnar deviation     Wrist radial deviation     Wrist pronation     Wrist  supination     Grip strength (lbs)     (Blank rows = not tested)  SHOULDER SPECIAL TESTS: Impingement tests: Neer impingement test: positive    PALPATION:  Very tight in the upper trap, neck, very tender here and in the upper arms and biceps   TODAY'S TREATMENT:  DATE: 01/11/23 Brisk walk around the back building, SOB On airex 5# straight arm pulls On airex cone toe touches 5# hip flexion, abduction and extension 2x10 Doorway stretch 2# stick reach flexion and extension Wall slides Passive stretch both shoulders all motions  01/08/23 UBE level 4.5 x 6 minutes Doorway stretch 20# rows 20# lats 3# bar reach on finger ladder 3# ER bar IR and extension Supine 3# punches, ER/IR and flexion PROM to the right shoulder all motions STM to the rhomboid area  01/04/23 Nustep level 5 x 6 minutes Stretch of the left shoulder flexion using the pull up bar On bosu balancing Airex balance beam tandem and side stepping Leg press 50# 2x10 10# straight arm pulls with her standing on airex 5# chest press 3# wate bar overhead carry Red tband ER/IR Passive stretch right shoulder and joint mobs AROM flexion 130, abduction 110  01/02/23 Bike level 4 x 6 minutes UBE level 4 x 4 minutes 10# straight arm pulls 2# wate bar flexion, extension and IR Body blade isometrics right shoulder all motions needed help 5# hip abduction and extension 2x10 Use of 6" step to get up to the pull up bar and used this to hang with her feet on the ground by leaning to get a better stretch Supine stretches and reaches  12/25/22 Nustep level 5 x 6 minutes 10# straight arm pulls 2x10 25# triceps 2x10 5# biceps 2x10 5# hips abduction, extension and fleixon Doorway stretch 3# bar overhead carry 3# and 1# cabinet reaching PROM, joint mobs of the right shoulder  12/21/22 UBE level 4 x 4  minutes Bike level 4 x 4 minutes with 3 power bursts 3# wate bar overhead carry Wate bar finger ladder reach, wate bar extension On airex ball toss Ball roll on wall Ball vs wall Supine 5# punches, 3# Flexion, 3# ER/IR  12/19/22 Bike LEvel 4 x 5 minutes 3 power bursts Nustep level 5 x 4 minutes Serratus rolls 10# straight arm pulls 40# resisted gait all directions 3# wate bar overhead carry 2 laps Kneel on bosu and use the mat table to get up from their Yellow tband ER 2x10 Yellow tband small abduction Yellow tband horizontal abduction Volleyball Passive stretch right shoulder supine  PATIENT EDUCATION: Education details: POC Person educated: Patient Education method: Explanation Education comprehension: verbalized understanding  HOME EXERCISE PROGRAM: Doorway stretch   ASSESSMENT:  CLINICAL IMPRESSION: Patient with some tightness noticed on teh left shoulder today, overall, still good ROM but less that what she has exhibited.  I stretched both shoulders to her limits and she was a little sore.  We are planning on next week being out last and we are talking about HEP OBJECTIVE IMPAIRMENTS: decreased balance, decreased endurance, decreased ROM, decreased strength, increased muscle spasms, impaired flexibility, impaired UE functional use, postural dysfunction, and pain.   REHAB POTENTIAL: Good  CLINICAL DECISION MAKING: Stable/uncomplicated  EVALUATION COMPLEXITY: Low   GOALS: Goals reviewed with patient? Yes  SHORT TERM GOALS: Target date: 08/31/22  Independent with initial HEP Goal status: met  LONG TERM GOALS: Target date: 11/09/22  Independent with advanced HEP Goal status: progressing  2.  Decrease pain overall 50% Goal status: progressing  3.  Report no difficulty doing hair Goal status: met  4.  Report able to cook Thanksgiving dinner without diffiucty Goal status: met  5.  Increase AROM of the shoulders by 20 degrees for all motions Goal status:  met  PLAN:  PT FREQUENCY: 1-2x/week  PT  DURATION: 12 weeks  PLANNED INTERVENTIONS: Therapeutic exercises, Therapeutic activity, Neuromuscular re-education, Balance training, Gait training, Patient/Family education, Self Care, Joint mobilization, Joint manipulation, Dry Needling, Electrical stimulation, Cryotherapy, Moist heat, Taping, Vasopneumatic device, and Manual therapy  PLAN FOR NEXT SESSION:  look to D/C next week Johndavid Geralds W, PT 01/11/2023, 2:09 PM

## 2023-01-16 ENCOUNTER — Ambulatory Visit: Payer: Medicare Other | Admitting: Physical Therapy

## 2023-01-16 ENCOUNTER — Encounter: Payer: Self-pay | Admitting: Physical Therapy

## 2023-01-16 DIAGNOSIS — M25512 Pain in left shoulder: Secondary | ICD-10-CM | POA: Diagnosis not present

## 2023-01-16 DIAGNOSIS — M6281 Muscle weakness (generalized): Secondary | ICD-10-CM | POA: Diagnosis not present

## 2023-01-16 DIAGNOSIS — R296 Repeated falls: Secondary | ICD-10-CM | POA: Diagnosis not present

## 2023-01-16 DIAGNOSIS — M25511 Pain in right shoulder: Secondary | ICD-10-CM | POA: Diagnosis not present

## 2023-01-16 DIAGNOSIS — R262 Difficulty in walking, not elsewhere classified: Secondary | ICD-10-CM | POA: Diagnosis not present

## 2023-01-16 NOTE — Therapy (Signed)
OUTPATIENT PHYSICAL THERAPY SHOULDER TREATMENT      Patient Name: Kim Grant MRN: [id number] DOB:12-29-1940, 82 y.o., female Today's Date: 01/16/2023   PT End of Session - 01/16/23 1322     Visit Number 32    Date for PT Re-Evaluation 01/25/23    Authorization Type [employer]BS medicare    PT Start Time 1315    PT Stop Time 1400    PT Time Calculation (min) 45 min    Activity Tolerance Patient tolerated treatment well    Behavior During Therapy [facility] for tasks assessed/performed             Past Medical History:  Diagnosis Date   Anterior tibialis tendinitis 04/18/2016   Arthritis of knee 10/19/2020   Formatting of this note might be different from the original. Added automatically from request for surgery [id number]   Asymptomatic postprocedural ovarian failure 08/04/2014   Bilateral impacted cerumen 05/13/2020   Last Assessment & Plan:  Formatting of this note might be different from the original. HPI:  Complains of stopped up Bilateral ear(s). EXAM: shows cerumen impaction. PLAN: Cerumen removed with various instruments (curettes, suction) giving subjective relief.  External canals and tympanic membranes are otherwise normal.  Return as needed.   Chronic bronchitis ([place]) 08/04/2014   Closed fracture of nasal bone with routine healing 11/02/2021   Last Assessment & Plan:  Formatting of this note might be different from the original. Nasal fracture. Fell with facial trauma about 3 weeks ago.  CT of the face is reviewed and shows minimally displaced nasal fracture.  Denies any nasal obstruction. EXAM shows mild deviation of the nasal dorsum.  Subtle.  Intranasal exam is unremarkable. PLAN: Reassured I think everything is going to be okay with   Depression    DJD (degenerative joint disease), ankle and foot 01/10/2016   Fall 01/20/2017   Fatigue 02/04/2018   Fracture of surgical neck of right humerus 01/20/2017   Hyperlipemia    Mammographic microcalcification found on diagnostic imaging of  breast 08/04/2014   Osteoporosis    Tear of left rotator cuff 07/26/2018   Formatting of this note might be different from the original. 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   Barrett's esophagus without dysplasia 11/25/2018   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[place]) 05/25/2017   Primary osteoarthritis of left foot 12/02/2015   Age-related osteoporosis without current pathological fracture 08/04/2014   Mixed hyperlipidemia 08/04/2014   Aortic stenosis 08/04/2014   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/2014   Primary osteoarthritis of right knee 06/08/2014    PCP: Josephine Igo  REFERRING PROVIDER: Leslye Peer DIAG: bilateral shoulder pain  THERAPY DIAG:  Acute pain of right shoulder  Difficulty in walking, not elsewhere classified  Muscle weakness (generalized)  Acute pain of left shoulder  Rationale for Evaluation and Treatment Rehabilitation  ONSET DATE: September 2023  SUBJECTIVE:  SUBJECTIVE STATEMENT: Tired but doing well.  Reports that she is a little stiff with reaching up and dressing but not much pain PERTINENT HISTORY: Right shoulder humeral fracture and left shoulder RC repair  PAIN:  Are you having pain?  Yes: NPRS scale: 1/10 Pain location: bilateral shoulders  Pain description: sharp, ache Aggravating factors: reaching up, out pain up to 6/10 Relieving factors: rest, OTC pain meds pain at best a 3/10  PRECAUTIONS: None  WEIGHT BEARING RESTRICTIONS: No  FALLS:  Has patient fallen in last 6 months? No  LIVING ENVIRONMENT: Lives with: lives with their family Lives in: House/apartment Stairs: No Has following equipment at home: None  OCCUPATION: retired  PLOF: Independent and gym 2x/week reports not much walking due to "very poor stamina"  PATIENT GOALS:less pain, stronger, take less pain meds, do hair, reach easier  OBJECTIVE:   DIAGNOSTIC FINDINGS:  None recently  PATIENT SURVEYS:  FOTO 36  COGNITION: Overall cognitive status: Within functional limits for tasks assessed     SENSATION: WFL  POSTURE: Fwd head, rounded shoulders  UPPER EXTREMITY ROM:   Active ROM Right eval Left eval Right AROM 10/02/22 Right AROM 10/31/22  Right AROM 11/14/22 Right AROM  12/01/22 Right AROM 12/21/22  Shoulder flexion 80 100 100 110 115 120 127  Shoulder extension         Shoulder abduction 70 60 80 87 90  104  Shoulder adduction         Shoulder internal rotation 30 10     45  Shoulder external rotation 40 50 50 55   62  Elbow flexion         Elbow extension         Wrist flexion         Wrist extension         Wrist ulnar deviation         Wrist radial deviation         Wrist pronation         Wrist supination         (Blank rows = not tested)  UPPER EXTREMITY MMT:  MMT Right eval Left eval Right 12/25/22  Shoulder flexion 3- 3- 3+  Shoulder extension     Shoulder abduction 3- 3- 3+  Shoulder adduction     Shoulder internal rotation 3+ 3+ 4-  Shoulder external rotation 3 3 4-  Middle trapezius     Lower trapezius     Elbow flexion 4- 4-   Elbow extension 4- 4-   Wrist flexion     Wrist extension     Wrist ulnar deviation     Wrist radial deviation      Wrist pronation     Wrist supination     Grip strength (lbs)     (Blank rows = not tested)  SHOULDER SPECIAL TESTS: Impingement tests: Neer impingement test: positive    PALPATION:  Very tight in the upper trap, neck, very tender here and in the upper arms and biceps   TODAY'S TREATMENT:  DATE: 01/16/23 UBE level 4 x 6 minutes Stretch from the pullup handles 5# chest press 10# straight arm pulls Yellow tband ER/IR Yellow tband small abd 5# hip abduction, flexion and extension PROM, joint mobs right shoulder  01/11/23 Brisk walk around the back building, SOB On airex 5# straight arm pulls On airex cone toe touches 5# hip flexion, abduction and extension 2x10 Doorway stretch 2# stick reach flexion and extension Wall slides Passive stretch both shoulders all motions  01/08/23 UBE level 4.5 x 6 minutes Doorway stretch 20# rows 20# lats 3# bar reach on finger ladder 3# ER bar IR and extension Supine 3# punches, ER/IR and flexion PROM to the right shoulder all motions STM to the rhomboid area  01/04/23 Nustep level 5 x 6 minutes Stretch of the left shoulder flexion using the pull up bar On bosu balancing Airex balance beam tandem and side stepping Leg press 50# 2x10 10# straight arm pulls with her standing on airex 5# chest press 3# wate bar overhead carry Red tband ER/IR Passive stretch right shoulder and joint mobs AROM flexion 130, abduction 110  01/02/23 Bike level 4 x 6 minutes UBE level 4 x 4 minutes 10# straight arm pulls 2# wate bar flexion, extension and IR Body blade isometrics right shoulder all motions needed help 5# hip abduction and extension 2x10 Use of 6" step to get up to the pull up bar and used this to hang with her feet on the ground by leaning to get a better stretch Supine stretches and reaches  12/25/22 Nustep level 5  x 6 minutes 10# straight arm pulls 2x10 25# triceps 2x10 5# biceps 2x10 5# hips abduction, extension and fleixon Doorway stretch 3# bar overhead carry 3# and 1# cabinet reaching PROM, joint mobs of the right shoulder  12/21/22 UBE level 4 x 4 minutes Bike level 4 x 4 minutes with 3 power bursts 3# wate bar overhead carry Wate bar finger ladder reach, wate bar extension On airex ball toss Ball roll on wall Ball vs wall Supine 5# punches, 3# Flexion, 3# ER/IR  12/19/22 Bike LEvel 4 x 5 minutes 3 power bursts Nustep level 5 x 4 minutes Serratus rolls 10# straight arm pulls 40# resisted gait all directions 3# wate bar overhead carry 2 laps Kneel on bosu and use the mat table to get up from their Yellow tband ER 2x10 Yellow tband small abduction Yellow tband horizontal abduction Volleyball Passive stretch right shoulder supine  PATIENT EDUCATION: Education details: POC Person educated: Patient Education method: Explanation Education comprehension: verbalized understanding  HOME EXERCISE PROGRAM: Doorway stretch   ASSESSMENT:  CLINICAL IMPRESSION: Patient doing well overall, we started talking about the HEP and the independent gym program to have her continue to progress on her own after d/c OBJECTIVE IMPAIRMENTS: decreased balance, decreased endurance, decreased ROM, decreased strength, increased muscle spasms, impaired flexibility, impaired UE functional use, postural dysfunction, and pain.   REHAB POTENTIAL: Good  CLINICAL DECISION MAKING: Stable/uncomplicated  EVALUATION COMPLEXITY: Low   GOALS: Goals reviewed with patient? Yes  SHORT TERM GOALS: Target date: 08/31/22  Independent with initial HEP Goal status: met  LONG TERM GOALS: Target date: 11/09/22  Independent with advanced HEP Goal status: progressing  2.  Decrease pain overall 50% Goal status: progressing  3.  Report no difficulty doing hair Goal status: met  4.  Report able to cook  Thanksgiving dinner without diffiucty Goal status: met  5.  Increase AROM of the shoulders by 20 degrees for  all motions Goal status: met  PLAN:  PT FREQUENCY: 1-2x/week  PT DURATION: 12 weeks  PLANNED INTERVENTIONS: Therapeutic exercises, Therapeutic activity, Neuromuscular re-education, Balance training, Gait training, Patient/Family education, Self Care, Joint mobilization, Joint manipulation, Dry Needling, Electrical stimulation, Cryotherapy, Moist heat, Taping, Vasopneumatic device, and Manual therapy  PLAN FOR NEXT SESSION:  assess and d/c Tilford Deaton W, PT 01/16/2023, 1:23 PM

## 2023-01-18 ENCOUNTER — Encounter: Payer: Self-pay | Admitting: Physical Therapy

## 2023-01-18 ENCOUNTER — Ambulatory Visit: Payer: Medicare Other | Admitting: Physical Therapy

## 2023-01-18 DIAGNOSIS — M25512 Pain in left shoulder: Secondary | ICD-10-CM

## 2023-01-18 DIAGNOSIS — R262 Difficulty in walking, not elsewhere classified: Secondary | ICD-10-CM

## 2023-01-18 DIAGNOSIS — M25511 Pain in right shoulder: Secondary | ICD-10-CM | POA: Diagnosis not present

## 2023-01-18 DIAGNOSIS — R296 Repeated falls: Secondary | ICD-10-CM | POA: Diagnosis not present

## 2023-01-18 DIAGNOSIS — M6281 Muscle weakness (generalized): Secondary | ICD-10-CM | POA: Diagnosis not present

## 2023-01-18 NOTE — Therapy (Signed)
OUTPATIENT PHYSICAL THERAPY SHOULDER TREATMENT      Patient Name: Kim Grant MRN: [id number] DOB:15-Dec-1940, 82 y.o., female Today's Date: 01/18/2023   PT End of Session - 01/18/23 1409     Visit Number 33    Date for PT Re-Evaluation 01/25/23    Authorization Type [employer] medicare    PT Start Time 1400    PT Stop Time 1445    PT Time Calculation (min) 45 min    Activity Tolerance Patient tolerated treatment well    Behavior During Therapy [facility] for tasks assessed/performed             Past Medical History:  Diagnosis Date   Anterior tibialis tendinitis 04/18/2016   Arthritis of knee 10/19/2020   Formatting of this note might be different from the original. Added automatically from request for surgery [id number]   Asymptomatic postprocedural ovarian failure 08/04/2014   Bilateral impacted cerumen 05/13/2020   Last Assessment & Plan:  Formatting of this note might be different from the original. HPI:  Complains of stopped up Bilateral ear(s). EXAM: shows cerumen impaction. PLAN: Cerumen removed with various instruments (curettes, suction) giving subjective relief.  External canals and tympanic membranes are otherwise normal.  Return as needed.   Chronic bronchitis ([place]) 08/04/2014   Closed fracture of nasal bone with routine healing 11/02/2021   Last Assessment & Plan:  Formatting of this note might be different from the original. Nasal fracture. Fell with facial trauma about 3 weeks ago.  CT of the face is reviewed and shows minimally displaced nasal fracture.  Denies any nasal obstruction. EXAM shows mild deviation of the nasal dorsum.  Subtle.  Intranasal exam is unremarkable. PLAN: Reassured I think everything is going to be okay with   Depression    DJD (degenerative joint disease), ankle and foot 01/10/2016   Fall 01/20/2017   Fatigue 02/04/2018   Fracture of surgical neck of right humerus 01/20/2017   Hyperlipemia    Mammographic microcalcification found on diagnostic imaging of  breast 08/04/2014   Osteoporosis    Tear of left rotator cuff 07/26/2018   Formatting of this note might be different from the original. 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[place]) 05/25/2017   Primary osteoarthritis of left foot 12/02/2015   Age-related osteoporosis without current pathological fracture 08/04/2014   Mixed hyperlipidemia 08/04/2014   Aortic stenosis 08/04/2014   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/2014   Primary osteoarthritis of right knee 06/08/2014    PCP: Josephine Igo  REFERRING PROVIDER: Leslye Peer DIAG: bilateral shoulder pain  THERAPY DIAG:  Acute pain of right shoulder  Difficulty in walking, not elsewhere classified  Muscle weakness (generalized)  Acute pain of left shoulder  Repeated falls  Rationale for Evaluation and Treatment Rehabilitation  ONSET DATE: September 2023  SUBJECTIVE:  SUBJECTIVE STATEMENT: Feeling really good today, lets make sure I can get up off the floor PERTINENT HISTORY: Right shoulder humeral fracture and left shoulder RC repair  PAIN:  Are you having pain? Yes: NPRS scale:  1/10 Pain location: bilateral shoulders  Pain description: sharp, ache Aggravating factors: reaching up, out pain up to 6/10 Relieving factors: rest, OTC pain meds pain at best a 3/10  PRECAUTIONS: None  WEIGHT BEARING RESTRICTIONS: No  FALLS:  Has patient fallen in last 6 months? No  LIVING ENVIRONMENT: Lives with: lives with their family Lives in: House/apartment Stairs: No Has following equipment at home: None  OCCUPATION: retired  PLOF: Independent and gym 2x/week reports not much walking due to "very poor stamina"  PATIENT GOALS:less pain, stronger, take less pain meds, do hair, reach easier  OBJECTIVE:   DIAGNOSTIC FINDINGS:  None recently  PATIENT SURVEYS:  FOTO 36  COGNITION: Overall cognitive status: Within functional limits for tasks assessed     SENSATION: WFL  POSTURE: Fwd head, rounded shoulders  UPPER EXTREMITY ROM:   Active ROM Right eval Left eval Right AROM 10/02/22 Right AROM 10/31/22  Right AROM 11/14/22 Right AROM  12/01/22 Right AROM 12/21/22  Shoulder flexion 80 100 100 110 115 120 127  Shoulder extension         Shoulder abduction 70 60 80 87 90  104  Shoulder adduction         Shoulder internal rotation 30 10     45  Shoulder external rotation 40 50 50 55   62  Elbow flexion         Elbow extension         Wrist flexion         Wrist extension         Wrist ulnar deviation         Wrist radial deviation         Wrist pronation         Wrist supination         (Blank rows = not tested)  UPPER EXTREMITY MMT:  MMT Right eval Left eval Right 12/25/22  Shoulder flexion 3- 3- 3+  Shoulder extension     Shoulder abduction 3- 3- 3+  Shoulder adduction     Shoulder internal rotation 3+ 3+ 4-  Shoulder external rotation 3 3 4-  Middle trapezius     Lower trapezius     Elbow flexion 4- 4-   Elbow extension 4- 4-   Wrist flexion     Wrist extension     Wrist ulnar deviation     Wrist radial deviation     Wrist pronation      Wrist supination     Grip strength (lbs)     (Blank rows = not tested)  SHOULDER SPECIAL TESTS: Impingement tests: Neer impingement test: positive    PALPATION:  Very tight in the upper trap, neck, very tender here and in the upper arms and biceps   TODAY'S TREATMENT:  DATE: 01/18/23 Worked with her on getting up from the floor x 2 with SBA UBE level 4 x 6 minutes Stretch on pull up bar On airex cone toe touches Side step on and off airex Red tband ER Passive stretches of the shoulder Reveiwed HEP and gym  01/16/23 UBE level 4 x 6 minutes Stretch from the pullup handles 5# chest press 10# straight arm pulls Yellow tband ER/IR Yellow tband small abd 5# hip abduction, flexion and extension PROM, joint mobs right shoulder  01/11/23 Brisk walk around the back building, SOB On airex 5# straight arm pulls On airex cone toe touches 5# hip flexion, abduction and extension 2x10 Doorway stretch 2# stick reach flexion and extension Wall slides Passive stretch both shoulders all motions  01/08/23 UBE level 4.5 x 6 minutes Doorway stretch 20# rows 20# lats 3# bar reach on finger ladder 3# ER bar IR and extension Supine 3# punches, ER/IR and flexion PROM to the right shoulder all motions STM to the rhomboid area  01/04/23 Nustep level 5 x 6 minutes Stretch of the left shoulder flexion using the pull up bar On bosu balancing Airex balance beam tandem and side stepping Leg press 50# 2x10 10# straight arm pulls with her standing on airex 5# chest press 3# wate bar overhead carry Red tband ER/IR Passive stretch right shoulder and joint mobs AROM flexion 130, abduction 110  01/02/23 Bike level 4 x 6 minutes UBE level 4 x 4 minutes 10# straight arm pulls 2# wate bar flexion, extension and IR Body blade isometrics right shoulder all motions needed  help 5# hip abduction and extension 2x10 Use of 6" step to get up to the pull up bar and used this to hang with her feet on the ground by leaning to get a better stretch Supine stretches and reaches  12/25/22 Nustep level 5 x 6 minutes 10# straight arm pulls 2x10 25# triceps 2x10 5# biceps 2x10 5# hips abduction, extension and fleixon Doorway stretch 3# bar overhead carry 3# and 1# cabinet reaching PROM, joint mobs of the right shoulder  PATIENT EDUCATION: Education details: POC Person educated: Patient Education method: Explanation Education comprehension: verbalized understanding  HOME EXERCISE PROGRAM: Doorway stretch   ASSESSMENT:  CLINICAL IMPRESSION: Patient doing well overall,I answered her questions about HEP and gym, we did practice up from the floor and she did this easily once and then a little more difficulty but still independent on the second try.  She feels that she is still a little tight but no issues with ADL's OBJECTIVE IMPAIRMENTS: decreased balance, decreased endurance, decreased ROM, decreased strength, increased muscle spasms, impaired flexibility, impaired UE functional use, postural dysfunction, and pain.   REHAB POTENTIAL: Good  CLINICAL DECISION MAKING: Stable/uncomplicated  EVALUATION COMPLEXITY: Low   GOALS: Goals reviewed with patient? Yes  SHORT TERM GOALS: Target date: 08/31/22  Independent with initial HEP Goal status: met  LONG TERM GOALS: Target date: 11/09/22  Independent with advanced HEP Goal status: met  2.  Decrease pain overall 50% Goal status: met  3.  Report no difficulty doing hair Goal status: met  4.  Report able to cook Thanksgiving dinner without diffiucty Goal status: met  5.  Increase AROM of the shoulders by 20 degrees for all motions Goal status: met  PLAN:  PT FREQUENCY: 1-2x/week  PT DURATION: 12 weeks  PLANNED INTERVENTIONS: Therapeutic exercises, Therapeutic activity, Neuromuscular re-education,  Balance training, Gait training, Patient/Family education, Self Care, Joint mobilization, Joint manipulation, Dry Needling, Electrical  stimulation, Cryotherapy, Moist heat, Taping, Vasopneumatic device, and Manual therapy  PLAN FOR NEXT SESSION:  D/C goals met Sumner Boast, PT 01/18/2023, 2:11 PM

## 2023-01-23 ENCOUNTER — Telehealth: Payer: Self-pay | Admitting: *Deleted

## 2023-01-23 ENCOUNTER — Telehealth: Payer: Self-pay | Admitting: Family Medicine

## 2023-01-23 NOTE — Telephone Encounter (Signed)
Per 12/31/20 LOS - called patient and lvm of upcoming appointments, requested call back to confirm - mailed calendar

## 2023-01-23 NOTE — Telephone Encounter (Signed)
[place] to schedule their annual wellness visit. Appointment made for 01/29/23.  [name] [phone]

## 2023-01-29 ENCOUNTER — Ambulatory Visit (INDEPENDENT_AMBULATORY_CARE_PROVIDER_SITE_OTHER): Payer: Medicare Other

## 2023-01-29 VITALS — Ht 65.0 in | Wt 160.0 lb

## 2023-01-29 DIAGNOSIS — Z0000 Encounter for general adult medical examination without abnormal findings: Secondary | ICD-10-CM

## 2023-01-29 NOTE — Patient Instructions (Signed)
Kim Grant , Thank you for taking time to come for your Medicare Wellness Visit. I appreciate your ongoing commitment to your health goals. Please review the following plan we discussed and let me know if I can assist you in the future.   These are the goals we discussed:  Goals      Patient Stated     01/29/2023, stay upright        This is a list of the screening recommended for you and due dates:  Health Maintenance  Topic Date Due   Zoster (Shingles) Vaccine (1 of 2) Never done   COVID-19 Vaccine (6 - 2023-24 season) 06/23/2022   Flu Shot  08/16/2024*   Medicare Annual Wellness Visit  01/29/2024   DTaP/Tdap/Td vaccine (3 - Td or Tdap) 03/19/2029   Pneumonia Vaccine  Completed   DEXA scan (bone density measurement)  Completed   HPV Vaccine  Aged Out  *Topic was postponed. The date shown is not the original due date.    Advanced directives: Please bring a copy of your POA (Power of Attorney) and/or Living Will to your next appointment.   Conditions/risks identified: none  Next appointment: Follow up in one year for your annual wellness visit    Preventive Care 65 Years and Older, Female Preventive care refers to lifestyle choices and visits with your health care provider that can promote health and wellness. What does preventive care include? A yearly physical exam. This is also called an annual well check. Dental exams once or twice a year. Routine eye exams. Ask your health care provider how often you should have your eyes checked. Personal lifestyle choices, including: Daily care of your teeth and gums. Regular physical activity. Eating a healthy diet. Avoiding tobacco and drug use. Limiting alcohol use. Practicing safe sex. Taking low-dose aspirin every day. Taking vitamin and mineral supplements as recommended by your health care provider. What happens during an annual well check? The services and screenings done by your health care provider during your annual well  check will depend on your age, overall health, lifestyle risk factors, and family history of disease. Counseling  Your health care provider may ask you questions about your: Alcohol use. Tobacco use. Drug use. Emotional well-being. Home and relationship well-being. Sexual activity. Eating habits. History of falls. Memory and ability to understand (cognition). Work and work Astronomer. Reproductive health. Screening  You may have the following tests or measurements: Height, weight, and BMI. Blood pressure. Lipid and cholesterol levels. These may be checked every 5 years, or more frequently if you are over 69 years old. Skin check. Lung cancer screening. You may have this screening every year starting at age 67 if you have a 30-pack-year history of smoking and currently smoke or have quit within the past 15 years. Fecal occult blood test (FOBT) of the stool. You may have this test every year starting at age 73. Flexible sigmoidoscopy or colonoscopy. You may have a sigmoidoscopy every 5 years or a colonoscopy every 10 years starting at age 28. Hepatitis C blood test. Hepatitis B blood test. Sexually transmitted disease (STD) testing. Diabetes screening. This is done by checking your blood sugar (glucose) after you have not eaten for a while (fasting). You may have this done every 1-3 years. Bone density scan. This is done to screen for osteoporosis. You may have this done starting at age 53. Mammogram. This may be done every 1-2 years. Talk to your health care provider about how often you should  have regular mammograms. Talk with your health care provider about your test results, treatment options, and if necessary, the need for more tests. Vaccines  Your health care provider may recommend certain vaccines, such as: Influenza vaccine. This is recommended every year. Tetanus, diphtheria, and acellular pertussis (Tdap, Td) vaccine. You may need a Td booster every 10 years. Zoster  vaccine. You may need this after age 69. Pneumococcal 13-valent conjugate (PCV13) vaccine. One dose is recommended after age 39. Pneumococcal polysaccharide (PPSV23) vaccine. One dose is recommended after age 43. Talk to your health care provider about which screenings and vaccines you need and how often you need them. This information is not intended to replace advice given to you by your health care provider. Make sure you discuss any questions you have with your health care provider. Document Released: 11/05/2015 Document Revised: 06/28/2016 Document Reviewed: 08/10/2015 Elsevier Interactive Patient Education  2017 [place] Prevention in the Home Falls can cause injuries. They can happen to people of all ages. There are many things you can do to make your home safe and to help prevent falls. What can I do on the outside of my home? Regularly fix the edges of walkways and driveways and fix any cracks. Remove anything that might make you trip as you walk through a door, such as a raised step or threshold. Trim any bushes or trees on the path to your home. Use bright outdoor lighting. Clear any walking paths of anything that might make someone trip, such as rocks or tools. Regularly check to see if handrails are loose or broken. Make sure that both sides of any steps have handrails. Any raised decks and porches should have guardrails on the edges. Have any leaves, snow, or ice cleared regularly. Use sand or salt on walking paths during winter. Clean up any spills in your garage right away. This includes oil or grease spills. What can I do in the bathroom? Use night lights. Install grab bars by the toilet and in the tub and shower. Do not use towel bars as grab bars. Use non-skid mats or decals in the tub or shower. If you need to sit down in the shower, use a plastic, non-slip stool. Keep the floor dry. Clean up any water that spills on the floor as soon as it happens. Remove  soap buildup in the tub or shower regularly. Attach bath mats securely with double-sided non-slip rug tape. Do not have throw rugs and other things on the floor that can make you trip. What can I do in the bedroom? Use night lights. Make sure that you have a light by your bed that is easy to reach. Do not use any sheets or blankets that are too big for your bed. They should not hang down onto the floor. Have a firm chair that has side arms. You can use this for support while you get dressed. Do not have throw rugs and other things on the floor that can make you trip. What can I do in the kitchen? Clean up any spills right away. Avoid walking on wet floors. Keep items that you use a lot in easy-to-reach places. If you need to reach something above you, use a strong step stool that has a grab bar. Keep electrical cords out of the way. Do not use floor polish or wax that makes floors slippery. If you must use wax, use non-skid floor wax. Do not have throw rugs and other things on the floor  that can make you trip. What can I do with my stairs? Do not leave any items on the stairs. Make sure that there are handrails on both sides of the stairs and use them. Fix handrails that are broken or loose. Make sure that handrails are as long as the stairways. Check any carpeting to make sure that it is firmly attached to the stairs. Fix any carpet that is loose or worn. Avoid having throw rugs at the top or bottom of the stairs. If you do have throw rugs, attach them to the floor with carpet tape. Make sure that you have a light switch at the top of the stairs and the bottom of the stairs. If you do not have them, ask someone to add them for you. What else can I do to help prevent falls? Wear shoes that: Do not have high heels. Have rubber bottoms. Are comfortable and fit you well. Are closed at the toe. Do not wear sandals. If you use a stepladder: Make sure that it is fully opened. Do not climb a  closed stepladder. Make sure that both sides of the stepladder are locked into place. Ask someone to hold it for you, if possible. Clearly mark and make sure that you can see: Any grab bars or handrails. First and last steps. Where the edge of each step is. Use tools that help you move around (mobility aids) if they are needed. These include: Canes. Walkers. Scooters. Crutches. Turn on the lights when you go into a dark area. Replace any light bulbs as soon as they burn out. Set up your furniture so you have a clear path. Avoid moving your furniture around. If any of your floors are uneven, fix them. If there are any pets around you, be aware of where they are. Review your medicines with your doctor. Some medicines can make you feel dizzy. This can increase your chance of falling. Ask your doctor what other things that you can do to help prevent falls. This information is not intended to replace advice given to you by your health care provider. Make sure you discuss any questions you have with your health care provider. Document Released: 08/05/2009 Document Revised: 03/16/2016 Document Reviewed: 11/13/2014 Elsevier Interactive Patient Education  2017 [employer].

## 2023-01-29 NOTE — Progress Notes (Signed)
I connected with  Kim Grant on 01/29/23 by a audio enabled telemedicine application and verified that I am speaking with the correct person using two identifiers.  Patient Location: Home  Provider Location: Office/Clinic  I discussed the limitations of evaluation and management by telemedicine. The patient expressed understanding and agreed to proceed.  Subjective:   Kim Grant is a 82 y.o. female who presents for Medicare Annual (Subsequent) preventive examination.  Review of Systems     Cardiac Risk Factors include: advanced age (>62men, >55 women);dyslipidemia     Objective:    Today's Vitals   01/29/23 1357  Weight: 160 lb (72.6 kg)  Height: 5\' 5"  (1.651 m)   Body mass index is 26.63 kg/m.     01/29/2023    2:01 PM 01/01/2023    2:28 PM 09/22/2022    2:20 PM 08/17/2022    9:30 AM 06/23/2022    2:35 PM 03/24/2022    2:06 PM 02/10/2022    1:45 PM  Advanced Directives  Does Patient Have a Medical Advance Directive? Yes No Yes No Yes No Yes  Type of Estate agent of [place];Living will  Healthcare Power of [place];Living will  Healthcare Power of [place];Living will    Does patient want to make changes to medical advance directive?     No - Patient declined    Copy of Healthcare Power of Attorney in Chart? No - copy requested  No - copy requested  No - copy requested    Would patient like information on creating a medical advance directive?   No - Patient declined  No - Patient declined No - Patient declined No - Patient declined    Current Medications (verified) Outpatient Encounter Medications as of 01/29/2023  Medication Sig   Apoaequorin (PREVAGEN PO) Take by mouth.   calcium citrate-vitamin D (CITRACAL+D) 315-200 MG-UNIT per tablet Take 3 tablets by mouth daily.   citalopram (CELEXA) 40 MG tablet Take 1 tablet (40 mg total) by mouth daily.   denosumab (PROLIA) 60 MG/ML SOSY injection Inject 60 mg into the skin every 6 (six) months.   meclizine  (ANTIVERT) 25 MG tablet Take 1 tablet (25 mg total) by mouth 3 (three) times daily as needed for dizziness.   melatonin 5 MG TABS Take 5 mg by mouth at bedtime as needed.   Multiple Vitamin (MULTIVITAMIN WITH MINERALS) TABS tablet Take 1 tablet by mouth daily.   pantoprazole (PROTONIX) 40 MG tablet Take 40 mg by mouth 2 (two) times daily.   rosuvastatin (CRESTOR) 40 MG tablet Take 1 tablet (40 mg total) by mouth daily.   UNABLE TO FIND Med Name: prevagin (Memory) OTC   No facility-administered encounter medications on file as of 01/29/2023.    Allergies (verified) Patient has no known allergies.   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 HPI:  Complains of stopped up Bilateral ear(s). EXAM: shows cerumen impaction. PLAN: Cerumen removed with various instruments (curettes, suction) giving subjective relief.  External canals and tympanic membranes are otherwise normal.  Return as needed.   Chronic bronchitis 08/04/2014   Closed fracture of nasal bone with routine healing 11/02/2021  Last Assessment & Plan:  Formatting of this note might be different from the original. Nasal fracture. Fell with facial trauma about 3 weeks ago.  CT of the face is reviewed and shows minimally displaced nasal fracture.  Denies any nasal obstruction. EXAM shows mild deviation of the nasal dorsum.  Subtle.  Intranasal exam is unremarkable. 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   Past Surgical History:  Procedure Laterality Date   ABDOMINAL HYSTERECTOMY     BREAST SURGERY     KNEE SURGERY Right    ROTATOR CUFF REPAIR     Family History  Problem Relation Age of Onset   Cancer Mother        ovarian    Heart disease Father    Social History   Socioeconomic History   Marital status: Widowed    Spouse name: Not on file   Number of children: Not on file   Years of education: Not on file   Highest education level: Not on file  Occupational History   Not on file  Tobacco Use   Smoking status: Former    Packs/day: 0.50    Years: 15.00    Additional pack years: 0.00    Total pack years: 7.50    Types: Cigarettes    Quit date: 02/28/2013    Years since quitting: 9.9    Passive exposure: Never   Smokeless tobacco: Never  Vaping Use   Vaping Use: Never used  Substance and Sexual Activity   Alcohol use: No   Drug use: No   Sexual activity: Not Currently  Other Topics Concern   Not on file  Social History Narrative   Pt works part time at [employer] in [place] x 18 years. She is widowed, has 2 grown sons. 2 grand daughters, 1 great granddaughter who is 1yo.    Social Determinants of Health   Financial Resource Strain: Low Risk  (01/29/2023)   Overall Financial Resource Strain (CARDIA)    Difficulty of Paying Living Expenses: Not hard at all  Food Insecurity: No Food Insecurity (01/29/2023)   Hunger Vital Sign    Worried About Running Out of Food in the Last Year: Never true    Ran Out of Food in the Last Year: Never true  Transportation Needs: No Transportation Needs (01/29/2023)   PRAPARE - Therapist, artTransportation     Lack of Transportation (Medical): No    Lack of Transportation (Non-Medical): No  Physical Activity: Sufficiently Active (01/29/2023)   Exercise Vital Sign    Days of Exercise per Week: 3 days    Minutes of Exercise per Session: 50 min  Stress: No Stress Concern Present (01/29/2023)   [employer] - Occupational Stress Questionnaire    Feeling of Stress : Not at all  Social Connections: Not on file    Tobacco Counseling Counseling given: Not Answered   Clinical Intake:  Pre-visit preparation completed: Yes  Pain : No/denies pain  Nutritional Status: BMI 25 -29 Overweight Nutritional Risks: None Diabetes: No  How often do you need to have someone help you when you read instructions, pamphlets, or other written materials from your doctor or pharmacy?: 1 - Never  Diabetic? no  Interpreter Needed?: No  Information entered by :: NAllen LPN   Activities of Daily Living    01/29/2023    2:01 PM  In your present state of health, do you have any difficulty performing the following activities:  Hearing? 0  Comment has hearing aids  Vision? 0  Difficulty concentrating or making decisions? 1  Walking or climbing stairs? 0  Dressing or bathing? 0  Doing errands, shopping? 0  Preparing Food and eating ? N  Using the Toilet? N  In the past six months, have you accidently leaked urine? Y  Do you have problems with loss of bowel control? N  Managing your Medications? N  Managing your Finances? N  Housekeeping or managing your Housekeeping? N    Patient Care Team: Garnette Gunner, MD as PCP - General (Family Medicine)  Indicate any recent Medical Services you may have received from other than [facility] providers in the past year (date may be approximate).     Assessment:   This is a routine wellness examination for [place].  Hearing/Vision screen Vision Screening - Comments:: Regular eye exams, [facility]  Dietary issues and exercise  activities discussed: Current Exercise Habits: Home exercise routine, Type of exercise: strength training/weights;walking;Other - see comments (stationary bike), Time (Minutes): 50, Frequency (Times/Week): 3, Weekly Exercise (Minutes/Week): 150   Goals Addressed             This Visit's Progress    Patient Stated       01/29/2023, stay upright       Depression Screen    01/29/2023    2:01 PM 12/27/2022   10:57 AM 02/17/2022    2:21 PM 11/18/2020    2:00 PM  PHQ 2/9 Scores  PHQ - 2 Score 0 0 0 0  PHQ- 9 Score  1  0    Fall Risk    01/29/2023    2:01 PM 12/27/2022   10:06 AM 02/17/2022    2:21 PM  Fall Risk   Falls in the past year? 0 0 1  Number falls in past yr: 0 0 1  Injury with Fall? 0 0 1  Risk for fall due to : Medication side effect    Follow up Falls prevention discussed;Education provided;Falls evaluation completed      FALL RISK PREVENTION PERTAINING TO THE HOME:  Any stairs in or around the home? No  If so, are there any without handrails? N/a Home free of loose throw rugs in walkways, pet beds, electrical cords, etc? Yes  Adequate lighting in your home to reduce risk of falls? Yes   ASSISTIVE DEVICES UTILIZED TO PREVENT FALLS:  Life alert? No  Use of a cane, walker or w/c? No  Grab bars in the bathroom? No  Shower chair or bench in shower? No  Elevated toilet seat or a handicapped toilet? Yes   TIMED UP AND GO:  Was the test performed? No .      Cognitive Function:        01/29/2023    2:04 PM  6CIT Screen  What Year? 0 points  What month? 0 points  What time? 0 points  Count back from 20 0 points  Months in reverse 0 points  Repeat phrase 0 points  Total Score 0 points    Immunizations Immunization History  Administered Date(s) Administered   Influenza, High Dose Seasonal PF 07/10/2016, 07/05/2018   Influenza-Unspecified 08/27/2007, 09/09/2010, 07/23/2015, 07/10/2016   PFIZER Comirnaty(Gray Top)Covid-19 Tri-Sucrose Vaccine 12/01/2019,  12/23/2019, 07/27/2020   PFIZER(Purple Top)SARS-COV-2 Vaccination 12/01/2019, 12/23/2019   Pneumococcal Conjugate-13 08/28/2017   Pneumococcal Polysaccharide-23 09/19/2010   Td 02/09/2007   Tdap 03/20/2019    TDAP status: Up to date  Flu Vaccine status: Up to date  Pneumococcal vaccine status: Up to date  Covid-19 vaccine status: Completed vaccines  Qualifies for Shingles Vaccine? Yes   Zostavax completed No   Shingrix Completed?: No.    Education has been provided regarding the importance of this vaccine. Patient has been advised to call insurance company to determine out of pocket expense if they have not yet received this vaccine. Advised may also receive vaccine at local pharmacy or Health Dept. Verbalized acceptance and understanding.  Screening Tests Health Maintenance  Topic Date Due   Zoster Vaccines- Shingrix (1 of 2) Never done   COVID-19 Vaccine (6 - 2023-24 season) 06/23/2022   Medicare Annual Wellness (AWV)  11/14/2022   INFLUENZA VACCINE  08/16/2024 (Originally 05/24/2023)   DTaP/Tdap/Td (3 - Td or Tdap) 03/19/2029   Pneumonia Vaccine 57+ Years old  Completed   DEXA SCAN  Completed   HPV VACCINES  Aged Out    Health Maintenance  Health Maintenance Due  Topic Date Due   Zoster Vaccines- Shingrix (1 of 2) Never done   COVID-19 Vaccine (6 - 2023-24 season) 06/23/2022   Medicare Annual Wellness (AWV)  11/14/2022    Colorectal cancer screening: No longer required.   Mammogram status: Completed 08/16/2021. Repeat every year  Bone Density status: Completed 01/21/2018.   Lung Cancer Screening: (Low Dose CT Chest recommended if Age 46-80 years, 30 pack-year currently smoking OR have quit w/in 15years.) does not qualify.   Lung Cancer Screening Referral: no  Additional Screening:  Hepatitis C Screening: does not qualify;   Vision Screening: Recommended annual ophthalmology exams for early detection of glaucoma and other disorders of the eye. Is the patient up  to date with their annual eye exam?  Yes  Who is the provider or what is the name of the office in which the patient attends annual eye exams? [facility] If pt is not established with a provider, would they like to be referred to a provider to establish care? No .   Dental Screening: Recommended annual dental exams for proper oral hygiene  Community Resource Referral / Chronic Care Management: CRR required this visit?  No   CCM required this visit?  No      Plan:     I have personally reviewed and noted the following in the patient's chart:   Medical and social history Use of alcohol, tobacco or illicit drugs  Current medications and supplements including opioid prescriptions. Patient is not currently taking opioid prescriptions. Functional ability and status Nutritional status Physical activity Advanced directives List of other physicians Hospitalizations, surgeries, and ER visits in previous 12 months Vitals Screenings to include cognitive, depression, and falls Referrals and appointments  In addition, I have reviewed and discussed with patient certain preventive protocols, quality metrics, and best practice recommendations. A written personalized care plan for preventive services as well as general preventive health recommendations were provided to patient.     Barb Merino, LPN   10/28/1094   Nurse Notes: none  Due  to this being a virtual visit, the after visit summary with patients personalized plan was offered to patient via mail or my-chart.  to pick up at office at next visit

## 2023-02-01 ENCOUNTER — Inpatient Hospital Stay: Payer: Medicare Other | Attending: Hematology & Oncology

## 2023-02-01 ENCOUNTER — Inpatient Hospital Stay: Payer: Medicare Other

## 2023-02-01 DIAGNOSIS — D631 Anemia in chronic kidney disease: Secondary | ICD-10-CM | POA: Diagnosis not present

## 2023-02-01 DIAGNOSIS — N189 Chronic kidney disease, unspecified: Secondary | ICD-10-CM | POA: Diagnosis not present

## 2023-02-01 DIAGNOSIS — D509 Iron deficiency anemia, unspecified: Secondary | ICD-10-CM

## 2023-02-01 LAB — CMP (CANCER CENTER ONLY)
ALT: 16 U/L (ref 0–44)
AST: 23 U/L (ref 15–41)
Albumin: 4.3 g/dL (ref 3.5–5.0)
Alkaline Phosphatase: 53 U/L (ref 38–126)
Anion gap: 7 (ref 5–15)
BUN: 22 mg/dL (ref 8–23)
CO2: 28 mmol/L (ref 22–32)
Calcium: 9.7 mg/dL (ref 8.9–10.3)
Chloride: 98 mmol/L (ref 98–111)
Creatinine: 1.41 mg/dL — ABNORMAL HIGH (ref 0.44–1.00)
GFR, Estimated: 38 mL/min — ABNORMAL LOW (ref >60)
Glucose, Bld: 138 mg/dL — ABNORMAL HIGH (ref 70–99)
Potassium: 4.5 mmol/L (ref 3.5–5.1)
Sodium: 133 mmol/L — ABNORMAL LOW (ref 135–145)
Total Bilirubin: 0.3 mg/dL (ref 0.3–1.2)
Total Protein: 7 g/dL (ref 6.5–8.1)

## 2023-02-01 LAB — CBC WITH DIFFERENTIAL (CANCER CENTER ONLY)
Abs Immature Granulocytes: 0.02 10*3/uL (ref 0.00–0.07)
Basophils Absolute: 0 10*3/uL (ref 0.0–0.1)
Basophils Relative: 1 %
Eosinophils Absolute: 0.1 10*3/uL (ref 0.0–0.5)
Eosinophils Relative: 2 %
HCT: 34.3 % — ABNORMAL LOW (ref 36.0–46.0)
Hemoglobin: 11.1 g/dL — ABNORMAL LOW (ref 12.0–15.0)
Immature Granulocytes: 0 %
Lymphocytes Relative: 24 %
Lymphs Abs: 1.4 10*3/uL (ref 0.7–4.0)
MCH: 26.4 pg (ref 26.0–34.0)
MCHC: 32.4 g/dL (ref 30.0–36.0)
MCV: 81.5 fL (ref 80.0–100.0)
Monocytes Absolute: 0.5 10*3/uL (ref 0.1–1.0)
Monocytes Relative: 8 %
Neutro Abs: 4.1 10*3/uL (ref 1.7–7.7)
Neutrophils Relative %: 65 %
Platelet Count: 313 10*3/uL (ref 150–400)
RBC: 4.21 MIL/uL (ref 3.87–5.11)
RDW: 14.9 % (ref 11.5–15.5)
WBC Count: 6.1 10*3/uL (ref 4.0–10.5)
nRBC: 0 % (ref 0.0–0.2)

## 2023-02-12 DIAGNOSIS — R413 Other amnesia: Secondary | ICD-10-CM | POA: Diagnosis not present

## 2023-02-27 ENCOUNTER — Other Ambulatory Visit: Payer: Self-pay | Admitting: Family Medicine

## 2023-02-27 DIAGNOSIS — E782 Mixed hyperlipidemia: Secondary | ICD-10-CM

## 2023-03-02 ENCOUNTER — Inpatient Hospital Stay: Payer: Medicare Other

## 2023-03-05 ENCOUNTER — Inpatient Hospital Stay: Payer: Medicare Other

## 2023-03-05 ENCOUNTER — Inpatient Hospital Stay: Payer: Medicare Other | Attending: Hematology & Oncology

## 2023-03-05 VITALS — BP 139/68 | HR 66 | Temp 97.9°F | Resp 17

## 2023-03-05 DIAGNOSIS — D509 Iron deficiency anemia, unspecified: Secondary | ICD-10-CM

## 2023-03-05 DIAGNOSIS — D631 Anemia in chronic kidney disease: Secondary | ICD-10-CM

## 2023-03-05 DIAGNOSIS — N189 Chronic kidney disease, unspecified: Secondary | ICD-10-CM | POA: Insufficient documentation

## 2023-03-05 LAB — CBC WITH DIFFERENTIAL (CANCER CENTER ONLY)
Abs Immature Granulocytes: 0.1 10*3/uL — ABNORMAL HIGH (ref 0.00–0.07)
Basophils Absolute: 0.1 10*3/uL (ref 0.0–0.1)
Basophils Relative: 1 %
Eosinophils Absolute: 0.2 10*3/uL (ref 0.0–0.5)
Eosinophils Relative: 2 %
HCT: 32.2 % — ABNORMAL LOW (ref 36.0–46.0)
Hemoglobin: 10.3 g/dL — ABNORMAL LOW (ref 12.0–15.0)
Immature Granulocytes: 1 %
Lymphocytes Relative: 18 %
Lymphs Abs: 1.6 10*3/uL (ref 0.7–4.0)
MCH: 26.5 pg (ref 26.0–34.0)
MCHC: 32 g/dL (ref 30.0–36.0)
MCV: 82.8 fL (ref 80.0–100.0)
Monocytes Absolute: 0.7 10*3/uL (ref 0.1–1.0)
Monocytes Relative: 8 %
Neutro Abs: 6.3 10*3/uL (ref 1.7–7.7)
Neutrophils Relative %: 70 %
Platelet Count: 289 10*3/uL (ref 150–400)
RBC: 3.89 MIL/uL (ref 3.87–5.11)
RDW: 15.8 % — ABNORMAL HIGH (ref 11.5–15.5)
WBC Count: 9 10*3/uL (ref 4.0–10.5)
nRBC: 0 % (ref 0.0–0.2)

## 2023-03-05 LAB — CMP (CANCER CENTER ONLY)
ALT: 25 U/L (ref 0–44)
AST: 30 U/L (ref 15–41)
Albumin: 4.4 g/dL (ref 3.5–5.0)
Alkaline Phosphatase: 61 U/L (ref 38–126)
Anion gap: 7 (ref 5–15)
BUN: 21 mg/dL (ref 8–23)
CO2: 28 mmol/L (ref 22–32)
Calcium: 10 mg/dL (ref 8.9–10.3)
Chloride: 97 mmol/L — ABNORMAL LOW (ref 98–111)
Creatinine: 1.14 mg/dL — ABNORMAL HIGH (ref 0.44–1.00)
GFR, Estimated: 48 mL/min — ABNORMAL LOW (ref >60)
Glucose, Bld: 90 mg/dL (ref 70–99)
Potassium: 4.4 mmol/L (ref 3.5–5.1)
Sodium: 132 mmol/L — ABNORMAL LOW (ref 135–145)
Total Bilirubin: 0.4 mg/dL (ref 0.3–1.2)
Total Protein: 7.1 g/dL (ref 6.5–8.1)

## 2023-03-05 MED ORDER — EPOETIN ALFA-EPBX 40000 UNIT/ML IJ SOLN
40000.0000 [IU] | Freq: Once | INTRAMUSCULAR | Status: AC
  Administered 2023-03-05: 40000 [IU] via SUBCUTANEOUS
  Filled 2023-03-05: qty 1

## 2023-03-27 DIAGNOSIS — Z7189 Other specified counseling: Secondary | ICD-10-CM | POA: Diagnosis not present

## 2023-03-27 DIAGNOSIS — M810 Age-related osteoporosis without current pathological fracture: Secondary | ICD-10-CM | POA: Diagnosis not present

## 2023-03-27 DIAGNOSIS — Z87311 Personal history of (healed) other pathological fracture: Secondary | ICD-10-CM | POA: Diagnosis not present

## 2023-04-02 ENCOUNTER — Other Ambulatory Visit: Payer: Self-pay | Admitting: Family Medicine

## 2023-04-02 DIAGNOSIS — F330 Major depressive disorder, recurrent, mild: Secondary | ICD-10-CM

## 2023-04-02 DIAGNOSIS — E782 Mixed hyperlipidemia: Secondary | ICD-10-CM

## 2023-04-02 NOTE — Telephone Encounter (Signed)
Chart supports rx. Last OV: 01/29/2023 Next OV: 02/01/2024

## 2023-04-03 ENCOUNTER — Encounter: Payer: Self-pay | Admitting: Medical Oncology

## 2023-04-03 ENCOUNTER — Telehealth: Payer: Self-pay | Admitting: Family Medicine

## 2023-04-03 ENCOUNTER — Inpatient Hospital Stay: Payer: Medicare Other | Attending: Hematology & Oncology

## 2023-04-03 ENCOUNTER — Inpatient Hospital Stay (HOSPITAL_BASED_OUTPATIENT_CLINIC_OR_DEPARTMENT_OTHER): Payer: Medicare Other | Admitting: Medical Oncology

## 2023-04-03 ENCOUNTER — Other Ambulatory Visit: Payer: Self-pay

## 2023-04-03 ENCOUNTER — Inpatient Hospital Stay: Payer: Medicare Other

## 2023-04-03 VITALS — BP 141/50 | HR 62 | Temp 98.0°F | Resp 19 | Ht 64.0 in | Wt 166.0 lb

## 2023-04-03 DIAGNOSIS — D631 Anemia in chronic kidney disease: Secondary | ICD-10-CM | POA: Insufficient documentation

## 2023-04-03 DIAGNOSIS — N189 Chronic kidney disease, unspecified: Secondary | ICD-10-CM | POA: Diagnosis not present

## 2023-04-03 DIAGNOSIS — D509 Iron deficiency anemia, unspecified: Secondary | ICD-10-CM | POA: Diagnosis not present

## 2023-04-03 DIAGNOSIS — E782 Mixed hyperlipidemia: Secondary | ICD-10-CM

## 2023-04-03 LAB — RETICULOCYTES
Immature Retic Fract: 2 % — ABNORMAL LOW (ref 2.3–15.9)
RBC.: 3.98 MIL/uL (ref 3.87–5.11)
Retic Count, Absolute: 27.5 10*3/uL (ref 19.0–186.0)
Retic Ct Pct: 0.7 % (ref 0.4–3.1)

## 2023-04-03 LAB — CMP (CANCER CENTER ONLY)
ALT: 14 U/L (ref 0–44)
AST: 20 U/L (ref 15–41)
Albumin: 4.4 g/dL (ref 3.5–5.0)
Alkaline Phosphatase: 60 U/L (ref 38–126)
Anion gap: 9 (ref 5–15)
BUN: 23 mg/dL (ref 8–23)
CO2: 25 mmol/L (ref 22–32)
Calcium: 9.4 mg/dL (ref 8.9–10.3)
Chloride: 101 mmol/L (ref 98–111)
Creatinine: 1.14 mg/dL — ABNORMAL HIGH (ref 0.44–1.00)
GFR, Estimated: 48 mL/min — ABNORMAL LOW (ref >60)
Glucose, Bld: 90 mg/dL (ref 70–99)
Potassium: 4.4 mmol/L (ref 3.5–5.1)
Sodium: 135 mmol/L (ref 135–145)
Total Bilirubin: 0.4 mg/dL (ref 0.3–1.2)
Total Protein: 7 g/dL (ref 6.5–8.1)

## 2023-04-03 LAB — CBC WITH DIFFERENTIAL (CANCER CENTER ONLY)
Abs Immature Granulocytes: 0.01 10*3/uL (ref 0.00–0.07)
Basophils Absolute: 0.1 10*3/uL (ref 0.0–0.1)
Basophils Relative: 1 %
Eosinophils Absolute: 0.2 10*3/uL (ref 0.0–0.5)
Eosinophils Relative: 3 %
HCT: 33.3 % — ABNORMAL LOW (ref 36.0–46.0)
Hemoglobin: 10.7 g/dL — ABNORMAL LOW (ref 12.0–15.0)
Immature Granulocytes: 0 %
Lymphocytes Relative: 26 %
Lymphs Abs: 1.4 10*3/uL (ref 0.7–4.0)
MCH: 26.8 pg (ref 26.0–34.0)
MCHC: 32.1 g/dL (ref 30.0–36.0)
MCV: 83.3 fL (ref 80.0–100.0)
Monocytes Absolute: 0.4 10*3/uL (ref 0.1–1.0)
Monocytes Relative: 8 %
Neutro Abs: 3.4 10*3/uL (ref 1.7–7.7)
Neutrophils Relative %: 62 %
Platelet Count: 286 10*3/uL (ref 150–400)
RBC: 4 MIL/uL (ref 3.87–5.11)
RDW: 15.1 % (ref 11.5–15.5)
WBC Count: 5.4 10*3/uL (ref 4.0–10.5)
nRBC: 0 % (ref 0.0–0.2)

## 2023-04-03 LAB — FERRITIN: Ferritin: 46 ng/mL (ref 11–307)

## 2023-04-03 MED ORDER — ROSUVASTATIN CALCIUM 40 MG PO TABS
40.0000 mg | ORAL_TABLET | Freq: Every day | ORAL | 3 refills | Status: DC
Start: 2023-04-03 — End: 2023-12-24

## 2023-04-03 MED ORDER — EPOETIN ALFA-EPBX 40000 UNIT/ML IJ SOLN
40000.0000 [IU] | Freq: Once | INTRAMUSCULAR | Status: AC
  Administered 2023-04-03: 40000 [IU] via SUBCUTANEOUS
  Filled 2023-04-03: qty 1

## 2023-04-03 NOTE — Progress Notes (Signed)
Hematology and Oncology Follow Up Visit  Kim Grant [id number] Jun 18, 1941 82 y.o. 04/03/2023   Principle Diagnosis:  Erythropoietin deficiency anemia    Current Therapy:        Retacrit 40,000 units SQ for Hgb < 11               Interim History:  Kim Grant is here today for follow-up and injection.   She reports that she is doing really well. She is working out a lot and this has given her so much joy and strength.  No blood loss, bruising or petechiae noted.  No fever, chills, n/v, cough, rash, dizziness, SOB, chest pain, palpitations, abdominal pain or changes in bowel or bladder habits.  No swelling in her extremities.  No falls or syncope reported.  Appetite and hydration ar good.   ECOG Performance Status: 1 - Symptomatic but completely ambulatory  Medications:  Allergies as of 04/03/2023   No Known Allergies      Medication List        Accurate as of April 03, 2023  2:59 PM. If you have any questions, ask your nurse or doctor.          STOP taking these medications    pantoprazole 40 MG tablet Commonly known as: PROTONIX Stopped by: Rushie Chestnut, PA-C       TAKE these medications    calcium citrate-vitamin D 315-200 MG-UNIT tablet Commonly known as: CITRACAL+D Take 3 tablets by mouth daily.   citalopram 40 MG tablet Commonly known as: CELEXA TAKE 1 TABLET BY MOUTH EVERY DAY   denosumab 60 MG/ML Sosy injection Commonly known as: PROLIA Inject 60 mg into the skin every 6 (six) months.   meclizine 25 MG tablet Commonly known as: ANTIVERT Take 1 tablet (25 mg total) by mouth 3 (three) times daily as needed for dizziness.   melatonin 5 MG Tabs Take 5 mg by mouth at bedtime as needed.   multivitamin with minerals Tabs tablet Take 1 tablet by mouth daily.   omeprazole 40 MG capsule Commonly known as: PRILOSEC Take 40 mg by mouth daily.   PREVAGEN PO Take by mouth.   rosuvastatin 40 MG tablet Commonly known as: CRESTOR Take 1 tablet  (40 mg total) by mouth daily.   UNABLE TO FIND Med Name: prevagin (Memory) OTC        Allergies: No Known Allergies  Past Medical History, Surgical history, Social history, and Family History were reviewed and updated.  Review of Systems: All other 10 point review of systems is negative.   Physical Exam:  height is 5\' 4"  (1.626 m) and weight is 166 lb (75.3 kg). Her oral temperature is 98 F (36.7 C). Her blood pressure is 141/50 (abnormal) and her pulse is 62. Her respiration is 19 and oxygen saturation is 99%.   Wt Readings from Last 3 Encounters:  04/03/23 166 lb (75.3 kg)  01/29/23 160 lb (72.6 kg)  01/01/23 171 lb (77.6 kg)    Ocular: Sclerae unicteric, pupils equal, round and reactive to light Ear-nose-throat: Oropharynx clear, dentition fair Lymphatic: No cervical or supraclavicular adenopathy Lungs no rales or rhonchi, good excursion bilaterally Heart regular rate and rhythm, no murmur appreciated Abd soft, nontender, positive bowel sounds MSK no focal spinal tenderness, no joint edema Neuro: non-focal, well-oriented, appropriate affect  Lab Results  Component Value Date   WBC 5.4 04/03/2023   HGB 10.7 (L) 04/03/2023   HCT 33.3 (L) 04/03/2023   MCV 83.3 04/03/2023  PLT 286 04/03/2023   Lab Results  Component Value Date   FERRITIN 54 01/01/2023   IRON 88 01/01/2023   TIBC 377 01/01/2023   UIBC 289 01/01/2023   IRONPCTSAT 23 01/01/2023   Lab Results  Component Value Date   RETICCTPCT 0.7 04/03/2023   RBC 4.00 04/03/2023   No results found for: "KPAFRELGTCHN", "LAMBDASER", "KAPLAMBRATIO" No results found for: "IGGSERUM", "IGA", "IGMSERUM" No results found for: "TOTALPROTELP", "ALBUMINELP", "A1GS", "A2GS", "BETS", "BETA2SER", "GAMS", "MSPIKE", "SPEI"   Chemistry      Component Value Date/Time   NA 135 04/03/2023 1406   K 4.4 04/03/2023 1406   CL 101 04/03/2023 1406   CO2 25 04/03/2023 1406   BUN 23 04/03/2023 1406   CREATININE 1.14 (H)  04/03/2023 1406      Component Value Date/Time   CALCIUM 9.4 04/03/2023 1406   ALKPHOS 60 04/03/2023 1406   AST 20 04/03/2023 1406   ALT 14 04/03/2023 1406   BILITOT 0.4 04/03/2023 1406       Impression and Plan: Kim Grant is a very pleasant 82 yo caucasian female with erythropoietin deficiency anemia.  ESA given due to her Hgb being 10.7 Iron studies pending.   Disposition RTC monthly for lab (CBC) and injection RTC 3 months APP, labs (CBC, CMP) and injection-[place]  Rushie Chestnut, PA-C 6/11/20242:59 PM

## 2023-04-03 NOTE — Telephone Encounter (Signed)
error 

## 2023-04-03 NOTE — Telephone Encounter (Signed)
Went over lab results from 3/7 with patient. Pt never received lipid lab results and was unaware of new rx that was sent in. I read her the annotation from 3/7 and she verbalized understanding. Resent the rosuvastatin to [facility] and scheduled pt for 6 week follow up OV w/ fasting labs per annotation below.      "Patient still with significantly elevated cholesterol.  I recommend starting a stronger statin medication called rosuvastatin.  And recommend stopping the pravastatin.  Patient follow-up in 6 weeks for office visit and recheck with fasting labs. "

## 2023-04-03 NOTE — Telephone Encounter (Signed)
Pt has question about medication denial and also her cholesterol results . Please give the pt a call

## 2023-04-04 DIAGNOSIS — K080 Exfoliation of teeth due to systemic causes: Secondary | ICD-10-CM | POA: Diagnosis not present

## 2023-04-04 LAB — IRON AND IRON BINDING CAPACITY (CC-WL,HP ONLY)
Iron: 83 ug/dL (ref 28–170)
Saturation Ratios: 22 % (ref 10.4–31.8)
TIBC: 374 ug/dL (ref 250–450)
UIBC: 291 ug/dL (ref 148–442)

## 2023-05-03 ENCOUNTER — Inpatient Hospital Stay: Payer: Medicare Other

## 2023-05-03 ENCOUNTER — Inpatient Hospital Stay: Payer: Medicare Other | Attending: Hematology & Oncology

## 2023-05-03 VITALS — BP 119/60 | HR 64 | Temp 97.8°F | Resp 20

## 2023-05-03 DIAGNOSIS — N189 Chronic kidney disease, unspecified: Secondary | ICD-10-CM | POA: Diagnosis not present

## 2023-05-03 DIAGNOSIS — D631 Anemia in chronic kidney disease: Secondary | ICD-10-CM

## 2023-05-03 LAB — CBC WITH DIFFERENTIAL (CANCER CENTER ONLY)
Abs Immature Granulocytes: 0.02 10*3/uL (ref 0.00–0.07)
Basophils Absolute: 0.1 10*3/uL (ref 0.0–0.1)
Basophils Relative: 1 %
Eosinophils Absolute: 0.2 10*3/uL (ref 0.0–0.5)
Eosinophils Relative: 3 %
HCT: 34.4 % — ABNORMAL LOW (ref 36.0–46.0)
Hemoglobin: 10.9 g/dL — ABNORMAL LOW (ref 12.0–15.0)
Immature Granulocytes: 0 %
Lymphocytes Relative: 28 %
Lymphs Abs: 1.6 10*3/uL (ref 0.7–4.0)
MCH: 26.3 pg (ref 26.0–34.0)
MCHC: 31.7 g/dL (ref 30.0–36.0)
MCV: 82.9 fL (ref 80.0–100.0)
Monocytes Absolute: 0.5 10*3/uL (ref 0.1–1.0)
Monocytes Relative: 8 %
Neutro Abs: 3.5 10*3/uL (ref 1.7–7.7)
Neutrophils Relative %: 60 %
Platelet Count: 278 10*3/uL (ref 150–400)
RBC: 4.15 MIL/uL (ref 3.87–5.11)
RDW: 14.4 % (ref 11.5–15.5)
WBC Count: 5.8 10*3/uL (ref 4.0–10.5)
nRBC: 0 % (ref 0.0–0.2)

## 2023-05-03 MED ORDER — EPOETIN ALFA-EPBX 40000 UNIT/ML IJ SOLN
40000.0000 [IU] | Freq: Once | INTRAMUSCULAR | Status: AC
  Administered 2023-05-03: 40000 [IU] via SUBCUTANEOUS
  Filled 2023-05-03: qty 1

## 2023-05-03 NOTE — Patient Instructions (Signed)

## 2023-05-15 ENCOUNTER — Ambulatory Visit: Payer: Medicare Other | Admitting: Family Medicine

## 2023-05-31 DIAGNOSIS — Z96651 Presence of right artificial knee joint: Secondary | ICD-10-CM | POA: Diagnosis not present

## 2023-06-05 ENCOUNTER — Inpatient Hospital Stay: Payer: Medicare Other

## 2023-06-18 ENCOUNTER — Ambulatory Visit (INDEPENDENT_AMBULATORY_CARE_PROVIDER_SITE_OTHER): Payer: Medicare Other | Admitting: Family Medicine

## 2023-06-18 ENCOUNTER — Encounter: Payer: Self-pay | Admitting: Family Medicine

## 2023-06-18 VITALS — BP 120/72 | HR 69 | Temp 97.8°F | Wt 172.2 lb

## 2023-06-18 DIAGNOSIS — M810 Age-related osteoporosis without current pathological fracture: Secondary | ICD-10-CM

## 2023-06-18 DIAGNOSIS — M1711 Unilateral primary osteoarthritis, right knee: Secondary | ICD-10-CM

## 2023-06-18 DIAGNOSIS — E669 Obesity, unspecified: Secondary | ICD-10-CM | POA: Diagnosis not present

## 2023-06-18 DIAGNOSIS — Z6829 Body mass index (BMI) 29.0-29.9, adult: Secondary | ICD-10-CM

## 2023-06-18 DIAGNOSIS — E782 Mixed hyperlipidemia: Secondary | ICD-10-CM | POA: Diagnosis not present

## 2023-06-18 DIAGNOSIS — I739 Peripheral vascular disease, unspecified: Secondary | ICD-10-CM

## 2023-06-18 DIAGNOSIS — F330 Major depressive disorder, recurrent, mild: Secondary | ICD-10-CM

## 2023-06-18 LAB — LIPID PANEL
Cholesterol: 180 mg/dL (ref 0–200)
HDL: 59.6 mg/dL (ref >39.00)
LDL Cholesterol: 87 mg/dL (ref 0–99)
NonHDL: 120.67
Total CHOL/HDL Ratio: 3
Triglycerides: 166 mg/dL — ABNORMAL HIGH (ref 0.0–149.0)
VLDL: 33.2 mg/dL (ref 0.0–40.0)

## 2023-06-18 NOTE — Assessment & Plan Note (Signed)
Continuing on rosuvastatin 40 mg daily with no reported side effects.  Plan:  Recheck cholesterol levels today. Continue current medication and monitor for any side effects.

## 2023-06-18 NOTE — Progress Notes (Signed)
Assessment/Plan:   Problem List Items Addressed This Visit       Cardiovascular and Mediastinum   Small vessel disease (HCC)   Relevant Orders   Amb ref to Medical Nutrition Therapy-MNT     Musculoskeletal and Integument   Age-related osteoporosis without current pathological fracture   Relevant Orders   Amb ref to Medical Nutrition Therapy-MNT   Primary osteoarthritis of right knee   Relevant Orders   Amb ref to Medical Nutrition Therapy-MNT     Other   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HCC)    Reports worsened symptoms of depression despite being on citalopram 40 mg daily, affecting her energy levels and potentially contributing to weight gain.  Differential Diagnosis:  Depression exacerbated by obesity vs. Ineffective current medication dosage Plan:  Continue current dose of citalopram 40 mg daily. Encourage regular physical activity for mood improvement. Consider referral to a therapist or psychologist for additional support. Follow up in 3 months to reassess symptoms and effectiveness of the current regimen.      Relevant Orders   Amb ref to Medical Nutrition Therapy-MNT   Mixed hyperlipidemia - Primary    Continuing on rosuvastatin 40 mg daily with no reported side effects.  Plan:  Recheck cholesterol levels today. Continue current medication and monitor for any side effects.      Relevant Orders   Lipid Profile   Amb ref to Medical Nutrition Therapy-MNT   Obesity with serious comorbidity    BMI 29 with multiple comorbidities including atherosclerotic disease, hyperlipidemia, osteoporosis, osteoarthritis , heart disease.  Continued weight gain leading to increase in joint pain, depressed mood reports struggling with weight gain since 2019 and seeks assistance in managing her weight. Despite a healthy diet and regular exercise, she finds herself unable to lose weight  Differential Diagnosis:  Lifestyle and dietary habits vs Depression  influencing eating habits  Plan:  Refer to a dietitian for personalized nutritional counseling and weight loss strategies. Encourage continuation of current exercise regimen. Monitor weight and adjust plan as needed over the next 3 months.        Relevant Orders   Amb ref to Medical Nutrition Therapy-MNT    There are no discontinued medications.  Return in about 3 months (around 09/18/2023) for weight management .    Subjective:   Encounter date: 06/18/2023  Kim Grant is a 82 y.o. female who has Age-related osteoporosis without current pathological fracture; Barrett's esophagus without dysplasia; 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HCC); Mixed hyperlipidemia; Aortic stenosis; Erythropoietin deficiency anemia; Primary osteoarthritis of right knee; Presbycusis; Primary osteoarthritis of left foot; Vertigo; Chronic gastric ulcer; Hiatal hernia; Aortic atherosclerosis (HCC); Small vessel disease (HCC); Routine general medical examination at a health care facility; Memory changes; and Obesity with serious comorbidity on their problem list..   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/2014), Closed fracture of nasal bone with routine healing (11/02/2021), Depression, DJD (degenerative joint disease), ankle and foot (01/10/2016), Fall (01/20/2017), Fatigue (02/04/2018), Fracture of surgical neck of right humerus (01/20/2017), Hyperlipemia, Mammographic microcalcification found on diagnostic imaging of breast (08/04/2014), Osteoporosis, Tear of left rotator cuff (07/26/2018), and Total knee replacement status, 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 She  has been unable to successfully lose the gained weight despite regular exercise, including weights and cycling. She follows a healthy diet consisting of BP greens, chicken, and fish but struggles with carb cravings driven by depressive feelings. Kim Grant is on citalopram 40 mg for depression, which she reports has been less effective lately, leading to constant fatigue and increased depressive symptoms. Despite her best efforts to manage her weight through diet and exercise, she feels unsuccessful and is considering additional help with weight loss strategies.  Chronic Conditions Management: Kim Grant is monitored for several chronic conditions. She has a history of Barrett's esophagus, treated by a gastroenterologist, and has an upcoming ENT appointment. She also follows up with orthopedics for a knee replacement performed recently and is being treated with erythropoietin. Additionally, she continues to take rosuvastatin 40 mg for cholesterol without experiencing any side effects. Her shoulder, previously treated with therapy, shows improvement, allowing her better mobility and ability to exercise.      06/18/2023   10:37 AM 01/29/2023    2:01 PM 12/27/2022   10:57 AM  Depression screen PHQ 2/9  Decreased Interest 1 0 0  Down, Depressed, Hopeless 0 0 0  PHQ - 2 Score 1 0 0  Altered sleeping 0  0  Tired, decreased energy 1  0  Change in appetite 3  1  Feeling bad or failure about yourself  0  0  Trouble concentrating 0  0  Moving slowly or fidgety/restless 0  0  Suicidal thoughts 0  0  PHQ-9 Score 5  1  Difficult doing work/chores Somewhat difficult  Not difficult at all       06/18/2023   10:38 AM  GAD 7 : Generalized Anxiety Score  Nervous, Anxious, on Edge 0  Control/stop worrying 0  Worry too much - different things 0  Trouble relaxing 0  Restless 0  Easily annoyed or irritable 0  Afraid - awful might happen 0  Total GAD 7 Score 0  Anxiety Difficulty  Somewhat difficult    ROS: No SI or HI.    Review of Systems  Constitutional:  Positive for malaise/fatigue. Negative for weight loss.  HENT:  Positive for hearing loss (Chronic, follows with ENT).   Gastrointestinal:  Positive for heartburn (With Barrett's esophagus, controlled on Nexium).  Musculoskeletal:  Positive for joint pain.  Psychiatric/Behavioral:  Positive for depression and memory loss (Recently followed with neurology, stable,). Negative for suicidal ideas. The patient is nervous/anxious.     Past Surgical History:  Procedure Laterality Date   ABDOMINAL HYSTERECTOMY     BREAST SURGERY     KNEE SURGERY Right    ROTATOR CUFF REPAIR      Outpatient Medications Prior to Visit  Medication Sig Dispense Refill   Apoaequorin (PREVAGEN PO) Take by mouth.     calcium citrate-vitamin D (CITRACAL+D) 315-200 MG-UNIT per tablet Take 3 tablets by mouth daily.     citalopram (CELEXA) 40 MG tablet TAKE 1 TABLET BY MOUTH EVERY DAY 90 tablet 3   denosumab (PROLIA) 60 MG/ML SOSY injection Inject 60 mg into the skin every 6 (six) months.     meclizine (ANTIVERT) 25 MG tablet Take 1 tablet (25 mg total) by mouth 3 (three) times daily as needed for dizziness. 15 tablet 0   melatonin 5 MG TABS Take 5 mg by mouth at bedtime as needed.     Multiple Vitamin (MULTIVITAMIN WITH MINERALS) TABS tablet Take 1 tablet by mouth daily.     omeprazole (PRILOSEC)  40 MG capsule Take 40 mg by mouth daily.     rosuvastatin (CRESTOR) 40 MG tablet Take 1 tablet (40 mg total) by mouth daily. 90 tablet 3   UNABLE TO FIND Med Name: prevagin (Memory) OTC     No facility-administered medications prior to visit.    Family History  Problem Relation Age of Onset   Cancer Mother        ovarian    Heart disease Father     Social History   Socioeconomic History   Marital status: Widowed    Spouse name: Not on file   Number of children: Not on file   Years of education: Not on file   Highest education  level: Not on file  Occupational History   Not on file  Tobacco Use   Smoking status: Former    Current packs/day: 0.00    Average packs/day: 0.5 packs/day for 15.0 years (7.5 ttl pk-yrs)    Types: Cigarettes    Start date: 02/28/1998    Quit date: 02/28/2013    Years since quitting: 10.3    Passive exposure: Never   Smokeless tobacco: Never  Vaping Use   Vaping status: Never Used  Substance and Sexual Activity   Alcohol use: No   Drug use: No   Sexual activity: Not Currently  Other Topics Concern   Not on file  Social History Narrative   Pt works part time at [employer] hospital in [place] x 18 years. She is widowed, has 2 grown sons. 2 grand daughters, 1 great granddaughter who is 1yo.    Social Determinants of Health   Financial Resource Strain: Low Risk  (01/29/2023)   Overall Financial Resource Strain (CARDIA)    Difficulty of Paying Living Expenses: Not hard at all  Food Insecurity: No Food Insecurity (01/29/2023)   Hunger Vital Sign    Worried About Running Out of Food in the Last Year: Never true    Ran Out of Food in the Last Year: Never true  Transportation Needs: No Transportation Needs (01/29/2023)   PRAPARE - Administrator, Civil Service (Medical): No    Lack of Transportation (Non-Medical): No  Physical Activity: Sufficiently Active (01/29/2023)   Exercise Vital Sign    Days of Exercise per Week: 3 days    Minutes of Exercise per Session: 50 min  Stress: No Stress Concern Present (01/29/2023)   [employer] of Occupational Health - Occupational Stress Questionnaire    Feeling of Stress : Not at all  Social Connections: Not on file  Intimate Partner Violence: Not on file                                                                                                  Objective:  Physical Exam: BP 120/72 (BP Location: Left Arm, Patient Position: Sitting, Cuff Size: Large)   Pulse 69   Temp 97.8 F (36.6 C) (Temporal)   Wt 172 lb 3.2 oz (78.1 kg)    SpO2 98%   BMI 29.56 kg/m   Wt Readings from Last 3 Encounters:  06/18/23 172  lb 3.2 oz (78.1 kg)  04/03/23 166 lb (75.3 kg)  01/29/23 160 lb (72.6 kg)     Physical Exam Constitutional:      General: She is not in acute distress.    Appearance: Normal appearance. She is not ill-appearing or toxic-appearing.  HENT:     Head: Normocephalic and atraumatic.     Nose: Nose normal. No congestion.  Eyes:     General: No scleral icterus.    Extraocular Movements: Extraocular movements intact.  Cardiovascular:     Rate and Rhythm: Normal rate and regular rhythm.     Pulses: Normal pulses.     Heart sounds: Normal heart sounds.  Pulmonary:     Effort: Pulmonary effort is normal. No respiratory distress.     Breath sounds: Normal breath sounds.  Abdominal:     General: Abdomen is flat. Bowel sounds are normal.     Palpations: Abdomen is soft.  Musculoskeletal:        General: Normal range of motion.  Lymphadenopathy:     Cervical: No cervical adenopathy.  Skin:    General: Skin is warm and dry.     Findings: No rash.  Neurological:     General: No focal deficit present.     Mental Status: She is alert and oriented to person, place, and time. Mental status is at baseline.  Psychiatric:        Mood and Affect: Mood is anxious and depressed. Affect is tearful.        Speech: Speech is not rapid and pressured.        Behavior: Behavior is cooperative.        Thought Content: Thought content does not include homicidal or suicidal ideation.     No results found.  Recent Results (from the past 2160 hour(s))  Reticulocytes     Status: Abnormal   Collection Time: 04/03/23  2:05 PM  Result Value Ref Range   Retic Ct Pct 0.7 0.4 - 3.1 %   RBC. 3.98 3.87 - 5.11 MIL/uL   Retic Count, Absolute 27.5 19.0 - 186.0 K/uL   Immature Retic Fract 2.0 (L) 2.3 - 15.9 %    Comment: Performed at [facility] at [facility], [address] [place], [place] [address]   Iron and Iron Binding Capacity ([facility] only)     Status: None   Collection Time: 04/03/23  2:06 PM  Result Value Ref Range   Iron 83 28 - 170 ug/dL   TIBC 604 540 - 981 ug/dL   Saturation Ratios 22 10.4 - 31.8 %   UIBC 291 148 - 442 ug/dL    Comment: Performed at [facility], [address] [place], [place] [address]  Ferritin     Status: None   Collection Time: 04/03/23  2:06 PM  Result Value Ref Range   Ferritin 46 11 - 307 ng/mL    Comment: Performed at [facility], [address] [place], [place] [address]  CMP (Cancer Center only)     Status: Abnormal   Collection Time: 04/03/23  2:06 PM  Result Value Ref Range   Sodium 135 135 - 145 mmol/L   Potassium 4.4 3.5 - 5.1 mmol/L   [place] 101 98 - 111 mmol/L   CO2 25 22 - 32 mmol/L   Glucose, Bld 90 70 - 99 mg/dL    Comment: Glucose reference range applies only to samples taken after fasting for at  least 8 hours.   BUN 23 8 - 23 mg/dL   Creatinine 4.09 (H) 8.11 - 1.00 mg/dL   Calcium 9.4 8.9 - 91.4 mg/dL   Total Protein 7.0 6.5 - 8.1 g/dL   Albumin 4.4 3.5 - 5.0 g/dL   AST 20 15 - 41 U/L   ALT 14 0 - 44 U/L   Alkaline Phosphatase 60 38 - 126 U/L   Total Bilirubin 0.4 0.3 - 1.2 mg/dL   GFR, Estimated 48 (L) >60 mL/min    Comment: (NOTE) Calculated using the CKD-EPI Creatinine Equation (2021)    Anion gap 9 5 - 15    Comment: Performed at [facility] at [facility], [address], [place], [place] [address]  CBC with Differential (Cancer Center Only)     Status: Abnormal   Collection Time: 04/03/23  2:06 PM  Result Value Ref Range   WBC Count 5.4 4.0 - 10.5 K/uL   RBC 4.00 3.87 - 5.11 MIL/uL   Hemoglobin 10.7 (L) 12.0 - 15.0 g/dL   HCT 62.1 (L) 30.8 - 65.7 %   MCV 83.3 80.0 - 100.0 fL   MCH 26.8 26.0 - 34.0 pg   MCHC 32.1 30.0 - 36.0 g/dL   RDW 84.6 96.2 - 95.2 %   Platelet Count 286 150 - 400 K/uL   nRBC 0.0 0.0 - 0.2 %   Neutrophils  Relative % 62 %   Neutro Abs 3.4 1.7 - 7.7 K/uL   Lymphocytes Relative 26 %   Lymphs Abs 1.4 0.7 - 4.0 K/uL   Monocytes Relative 8 %   Monocytes Absolute 0.4 0.1 - 1.0 K/uL   Eosinophils Relative 3 %   Eosinophils Absolute 0.2 0.0 - 0.5 K/uL   Basophils Relative 1 %   Basophils Absolute 0.1 0.0 - 0.1 K/uL   Immature Granulocytes 0 %   Abs Immature Granulocytes 0.01 0.00 - 0.07 K/uL    Comment: Performed at [facility] at [facility], [address] [place], [place] [address]  CBC with Differential (Cancer Center Only)     Status: Abnormal   Collection Time: 05/03/23  2:01 PM  Result Value Ref Range   WBC Count 5.8 4.0 - 10.5 K/uL   RBC 4.15 3.87 - 5.11 MIL/uL   Hemoglobin 10.9 (L) 12.0 - 15.0 g/dL   HCT 44.0 (L) 10.2 - 72.5 %   MCV 82.9 80.0 - 100.0 fL   MCH 26.3 26.0 - 34.0 pg   MCHC 31.7 30.0 - 36.0 g/dL   RDW 36.6 44.0 - 34.7 %   Platelet Count 278 150 - 400 K/uL   nRBC 0.0 0.0 - 0.2 %   Neutrophils Relative % 60 %   Neutro Abs 3.5 1.7 - 7.7 K/uL   Lymphocytes Relative 28 %   Lymphs Abs 1.6 0.7 - 4.0 K/uL   Monocytes Relative 8 %   Monocytes Absolute 0.5 0.1 - 1.0 K/uL   Eosinophils Relative 3 %   Eosinophils Absolute 0.2 0.0 - 0.5 K/uL   Basophils Relative 1 %   Basophils Absolute 0.1 0.0 - 0.1 K/uL   Immature Granulocytes 0 %   Abs Immature Granulocytes 0.02 0.00 - 0.07 K/uL    Comment: Performed at [facility] at [facility], [address] [place], [place] [address]        Garner Nash, MD, MS

## 2023-06-18 NOTE — Assessment & Plan Note (Signed)
Reports worsened symptoms of depression despite being on citalopram 40 mg daily, affecting her energy levels and potentially contributing to weight gain.  Differential Diagnosis:  Depression exacerbated by obesity vs. Ineffective current medication dosage Plan:  Continue current dose of citalopram 40 mg daily. Encourage regular physical activity for mood improvement. Consider referral to a therapist or psychologist for additional support. Follow up in 3 months to reassess symptoms and effectiveness of the current regimen.

## 2023-06-18 NOTE — Assessment & Plan Note (Signed)
BMI 29 with multiple comorbidities including atherosclerotic disease, hyperlipidemia, osteoporosis, osteoarthritis , heart disease.  Continued weight gain leading to increase in joint pain, depressed mood reports struggling with weight gain since 2019 and seeks assistance in managing her weight. Despite a healthy diet and regular exercise, she finds herself unable to lose weight  Differential Diagnosis:  Lifestyle and dietary habits vs Depression influencing eating habits  Plan:  Refer to a dietitian for personalized nutritional counseling and weight loss strategies. Encourage continuation of current exercise regimen. Monitor weight and adjust plan as needed over the next 3 months.

## 2023-06-18 NOTE — Patient Instructions (Signed)
Continue exercising as you have been, aiming for at least 2-3 times per week. Follow a balanced, healthy diet,  and try to manage carb cravings. Stay on your current medications: citalopram 40 mg for depression and rosuvastatin 40 mg for cholesterol management. Follow up with a dietitian as referred to help with weight management. Monitor your cholesterol levels and get the required blood work done as directed.

## 2023-06-19 ENCOUNTER — Other Ambulatory Visit: Payer: Self-pay | Admitting: Medical Oncology

## 2023-06-19 ENCOUNTER — Inpatient Hospital Stay: Payer: Medicare Other

## 2023-06-19 ENCOUNTER — Inpatient Hospital Stay: Payer: Medicare Other | Attending: Hematology & Oncology

## 2023-06-19 DIAGNOSIS — D509 Iron deficiency anemia, unspecified: Secondary | ICD-10-CM

## 2023-06-19 DIAGNOSIS — D631 Anemia in chronic kidney disease: Secondary | ICD-10-CM

## 2023-06-19 DIAGNOSIS — N189 Chronic kidney disease, unspecified: Secondary | ICD-10-CM | POA: Diagnosis not present

## 2023-06-19 LAB — CMP (CANCER CENTER ONLY)
ALT: 22 U/L (ref 0–44)
AST: 31 U/L (ref 15–41)
Albumin: 4.4 g/dL (ref 3.5–5.0)
Alkaline Phosphatase: 50 U/L (ref 38–126)
Anion gap: 6 (ref 5–15)
BUN: 20 mg/dL (ref 8–23)
CO2: 29 mmol/L (ref 22–32)
Calcium: 10.1 mg/dL (ref 8.9–10.3)
Chloride: 99 mmol/L (ref 98–111)
Creatinine: 1.08 mg/dL — ABNORMAL HIGH (ref 0.44–1.00)
GFR, Estimated: 52 mL/min — ABNORMAL LOW (ref >60)
Glucose, Bld: 106 mg/dL — ABNORMAL HIGH (ref 70–99)
Potassium: 4.7 mmol/L (ref 3.5–5.1)
Sodium: 134 mmol/L — ABNORMAL LOW (ref 135–145)
Total Bilirubin: 0.4 mg/dL (ref 0.3–1.2)
Total Protein: 6.7 g/dL (ref 6.5–8.1)

## 2023-06-19 LAB — CBC WITH DIFFERENTIAL (CANCER CENTER ONLY)
Abs Immature Granulocytes: 0.01 10*3/uL (ref 0.00–0.07)
Basophils Absolute: 0 10*3/uL (ref 0.0–0.1)
Basophils Relative: 1 %
Eosinophils Absolute: 0.2 10*3/uL (ref 0.0–0.5)
Eosinophils Relative: 3 %
HCT: 33.3 % — ABNORMAL LOW (ref 36.0–46.0)
Hemoglobin: 10.8 g/dL — ABNORMAL LOW (ref 12.0–15.0)
Immature Granulocytes: 0 %
Lymphocytes Relative: 24 %
Lymphs Abs: 1.4 10*3/uL (ref 0.7–4.0)
MCH: 26.8 pg (ref 26.0–34.0)
MCHC: 32.4 g/dL (ref 30.0–36.0)
MCV: 82.6 fL (ref 80.0–100.0)
Monocytes Absolute: 0.4 10*3/uL (ref 0.1–1.0)
Monocytes Relative: 7 %
Neutro Abs: 3.7 10*3/uL (ref 1.7–7.7)
Neutrophils Relative %: 65 %
Platelet Count: 236 10*3/uL (ref 150–400)
RBC: 4.03 MIL/uL (ref 3.87–5.11)
RDW: 15.2 % (ref 11.5–15.5)
WBC Count: 5.7 10*3/uL (ref 4.0–10.5)
nRBC: 0 % (ref 0.0–0.2)

## 2023-06-19 LAB — RETICULOCYTES
Immature Retic Fract: 1.9 % — ABNORMAL LOW (ref 2.3–15.9)
RBC.: 4.08 MIL/uL (ref 3.87–5.11)
Retic Count, Absolute: 28.2 10*3/uL (ref 19.0–186.0)
Retic Ct Pct: 0.7 % (ref 0.4–3.1)

## 2023-06-19 LAB — IRON AND IRON BINDING CAPACITY (CC-WL,HP ONLY)
Iron: 97 ug/dL (ref 28–170)
Saturation Ratios: 29 % (ref 10.4–31.8)
TIBC: 339 ug/dL (ref 250–450)
UIBC: 242 ug/dL (ref 148–442)

## 2023-06-19 LAB — FERRITIN: Ferritin: 49 ng/mL (ref 11–307)

## 2023-06-19 NOTE — Progress Notes (Signed)
Patient came today for a Retacrit injection. HGB is 10.8. Per treatment parameters, patient should get the injection. Patient refused the injection today because she said," it makes her feel bad and also the HGB has been staying steady. " I talked with Clent Jacks, PA, and she said it was OK to HOLD it today. Patient comes back in two weeks, 07/03/23, for lab/Sarah/injection if needed. Patient verbalized understanding.

## 2023-06-22 DIAGNOSIS — K219 Gastro-esophageal reflux disease without esophagitis: Secondary | ICD-10-CM | POA: Diagnosis not present

## 2023-06-22 DIAGNOSIS — R1310 Dysphagia, unspecified: Secondary | ICD-10-CM | POA: Diagnosis not present

## 2023-06-22 DIAGNOSIS — K2270 Barrett's esophagus without dysplasia: Secondary | ICD-10-CM | POA: Diagnosis not present

## 2023-07-02 DIAGNOSIS — K2100 Gastro-esophageal reflux disease with esophagitis, without bleeding: Secondary | ICD-10-CM | POA: Diagnosis not present

## 2023-07-02 DIAGNOSIS — Z8719 Personal history of other diseases of the digestive system: Secondary | ICD-10-CM | POA: Diagnosis not present

## 2023-07-02 DIAGNOSIS — K219 Gastro-esophageal reflux disease without esophagitis: Secondary | ICD-10-CM | POA: Diagnosis not present

## 2023-07-02 DIAGNOSIS — K2289 Other specified disease of esophagus: Secondary | ICD-10-CM | POA: Diagnosis not present

## 2023-07-02 DIAGNOSIS — K449 Diaphragmatic hernia without obstruction or gangrene: Secondary | ICD-10-CM | POA: Diagnosis not present

## 2023-07-02 DIAGNOSIS — K3189 Other diseases of stomach and duodenum: Secondary | ICD-10-CM | POA: Diagnosis not present

## 2023-07-02 DIAGNOSIS — K2270 Barrett's esophagus without dysplasia: Secondary | ICD-10-CM | POA: Diagnosis not present

## 2023-07-02 DIAGNOSIS — K222 Esophageal obstruction: Secondary | ICD-10-CM | POA: Diagnosis not present

## 2023-07-02 DIAGNOSIS — R1310 Dysphagia, unspecified: Secondary | ICD-10-CM | POA: Diagnosis not present

## 2023-07-03 ENCOUNTER — Inpatient Hospital Stay: Payer: Medicare Other | Attending: Hematology & Oncology

## 2023-07-03 ENCOUNTER — Other Ambulatory Visit: Payer: Self-pay | Admitting: Medical Oncology

## 2023-07-03 ENCOUNTER — Inpatient Hospital Stay: Payer: Medicare Other | Admitting: Medical Oncology

## 2023-07-03 ENCOUNTER — Inpatient Hospital Stay: Payer: Medicare Other

## 2023-07-03 DIAGNOSIS — D509 Iron deficiency anemia, unspecified: Secondary | ICD-10-CM

## 2023-07-03 DIAGNOSIS — D631 Anemia in chronic kidney disease: Secondary | ICD-10-CM

## 2023-07-03 DIAGNOSIS — N189 Chronic kidney disease, unspecified: Secondary | ICD-10-CM | POA: Insufficient documentation

## 2023-07-16 ENCOUNTER — Other Ambulatory Visit: Payer: Self-pay

## 2023-07-16 ENCOUNTER — Encounter: Payer: Self-pay | Admitting: Medical Oncology

## 2023-07-16 ENCOUNTER — Inpatient Hospital Stay (HOSPITAL_BASED_OUTPATIENT_CLINIC_OR_DEPARTMENT_OTHER): Payer: Medicare Other | Admitting: Medical Oncology

## 2023-07-16 ENCOUNTER — Inpatient Hospital Stay: Payer: Medicare Other

## 2023-07-16 VITALS — BP 127/66 | HR 62 | Temp 97.7°F | Resp 18 | Ht 64.0 in | Wt 171.1 lb

## 2023-07-16 DIAGNOSIS — D631 Anemia in chronic kidney disease: Secondary | ICD-10-CM

## 2023-07-16 DIAGNOSIS — D509 Iron deficiency anemia, unspecified: Secondary | ICD-10-CM

## 2023-07-16 DIAGNOSIS — N189 Chronic kidney disease, unspecified: Secondary | ICD-10-CM | POA: Diagnosis not present

## 2023-07-16 LAB — RETICULOCYTES
Immature Retic Fract: 5.5 % (ref 2.3–15.9)
RBC.: 3.81 MIL/uL — ABNORMAL LOW (ref 3.87–5.11)
Retic Count, Absolute: 30.5 10*3/uL (ref 19.0–186.0)
Retic Ct Pct: 0.8 % (ref 0.4–3.1)

## 2023-07-16 LAB — CMP (CANCER CENTER ONLY)
ALT: 17 U/L (ref 0–44)
AST: 24 U/L (ref 15–41)
Albumin: 4.2 g/dL (ref 3.5–5.0)
Alkaline Phosphatase: 41 U/L (ref 38–126)
Anion gap: 5 (ref 5–15)
BUN: 18 mg/dL (ref 8–23)
CO2: 29 mmol/L (ref 22–32)
Calcium: 9 mg/dL (ref 8.9–10.3)
Chloride: 101 mmol/L (ref 98–111)
Creatinine: 1.1 mg/dL — ABNORMAL HIGH (ref 0.44–1.00)
GFR, Estimated: 50 mL/min — ABNORMAL LOW (ref >60)
Glucose, Bld: 88 mg/dL (ref 70–99)
Potassium: 4.7 mmol/L (ref 3.5–5.1)
Sodium: 135 mmol/L (ref 135–145)
Total Bilirubin: 0.3 mg/dL (ref 0.3–1.2)
Total Protein: 6.5 g/dL (ref 6.5–8.1)

## 2023-07-16 LAB — IRON AND IRON BINDING CAPACITY (CC-WL,HP ONLY)
Iron: 75 ug/dL (ref 28–170)
Saturation Ratios: 21 % (ref 10.4–31.8)
TIBC: 360 ug/dL (ref 250–450)
UIBC: 285 ug/dL (ref 148–442)

## 2023-07-16 LAB — CBC WITH DIFFERENTIAL (CANCER CENTER ONLY)
Abs Immature Granulocytes: 0.07 10*3/uL (ref 0.00–0.07)
Basophils Absolute: 0 10*3/uL (ref 0.0–0.1)
Basophils Relative: 1 %
Eosinophils Absolute: 0.2 10*3/uL (ref 0.0–0.5)
Eosinophils Relative: 3 %
HCT: 31.5 % — ABNORMAL LOW (ref 36.0–46.0)
Hemoglobin: 10.1 g/dL — ABNORMAL LOW (ref 12.0–15.0)
Immature Granulocytes: 1 %
Lymphocytes Relative: 27 %
Lymphs Abs: 1.5 10*3/uL (ref 0.7–4.0)
MCH: 26.7 pg (ref 26.0–34.0)
MCHC: 32.1 g/dL (ref 30.0–36.0)
MCV: 83.3 fL (ref 80.0–100.0)
Monocytes Absolute: 0.5 10*3/uL (ref 0.1–1.0)
Monocytes Relative: 9 %
Neutro Abs: 3.1 10*3/uL (ref 1.7–7.7)
Neutrophils Relative %: 59 %
Platelet Count: 249 10*3/uL (ref 150–400)
RBC: 3.78 MIL/uL — ABNORMAL LOW (ref 3.87–5.11)
RDW: 16.6 % — ABNORMAL HIGH (ref 11.5–15.5)
WBC Count: 5.3 10*3/uL (ref 4.0–10.5)
nRBC: 0 % (ref 0.0–0.2)

## 2023-07-16 LAB — FERRITIN: Ferritin: 63 ng/mL (ref 11–307)

## 2023-07-16 MED ORDER — EPOETIN ALFA-EPBX 40000 UNIT/ML IJ SOLN
40000.0000 [IU] | Freq: Once | INTRAMUSCULAR | Status: AC
  Administered 2023-07-16: 40000 [IU] via SUBCUTANEOUS
  Filled 2023-07-16: qty 1

## 2023-07-16 NOTE — Progress Notes (Signed)
Hematology and Oncology Follow Up Visit  Kim Grant [id number] 1941/09/05 82 y.o. 07/16/2023   Principle Diagnosis:  Erythropoietin deficiency anemia    Current Therapy:        Retacrit 40,000 units SQ for Hgb < 11               Interim History:  Kim Grant is here today for follow-up and injection.   She had a crazy day with care troubles today but is doing well otherwise.   She is working out 2-3 times per weeks. She uses machines and this has given her so much joy and strength.  No blood loss, bruising or petechiae noted.  No fever, chills, n/v, cough, rash, dizziness, SOB, chest pain, palpitations, abdominal pain or changes in bowel or bladder habits.  No swelling in her extremities.  No falls or syncope reported.  Appetite and hydration ar good.   Wt Readings from Last 3 Encounters:  07/16/23 171 lb 1.3 oz (77.6 kg)  06/18/23 172 lb 3.2 oz (78.1 kg)  04/03/23 166 lb (75.3 kg)     ECOG Performance Status: 1 - Symptomatic but completely ambulatory  Medications:  Allergies as of 07/16/2023   No Known Allergies      Medication List        Accurate as of July 16, 2023 12:14 PM. If you have any questions, ask your nurse or doctor.          calcium citrate-vitamin D 315-200 MG-UNIT tablet Commonly known as: CITRACAL+D Take 3 tablets by mouth daily.   citalopram 40 MG tablet Commonly known as: CELEXA TAKE 1 TABLET BY MOUTH EVERY DAY   denosumab 60 MG/ML Sosy injection Commonly known as: PROLIA Inject 60 mg into the skin every 6 (six) months.   meclizine 25 MG tablet Commonly known as: ANTIVERT Take 1 tablet (25 mg total) by mouth 3 (three) times daily as needed for dizziness.   melatonin 5 MG Tabs Take 5 mg by mouth at bedtime as needed.   multivitamin with minerals Tabs tablet Take 1 tablet by mouth daily.   omeprazole 40 MG capsule Commonly known as: PRILOSEC Take 40 mg by mouth daily.   PREVAGEN PO Take by mouth.   rosuvastatin 40 MG  tablet Commonly known as: CRESTOR Take 1 tablet (40 mg total) by mouth daily.        Allergies: No Known Allergies  Past Medical History, Surgical history, Social history, and Family History were reviewed and updated.  Review of Systems: All other 10 point review of systems is negative.   Physical Exam:  height is 5\' 4"  (1.626 m) and weight is 171 lb 1.3 oz (77.6 kg). Her oral temperature is 97.7 F (36.5 C). Her blood pressure is 127/66 and her pulse is 62. Her respiration is 18 and oxygen saturation is 100%.   Wt Readings from Last 3 Encounters:  07/16/23 171 lb 1.3 oz (77.6 kg)  06/18/23 172 lb 3.2 oz (78.1 kg)  04/03/23 166 lb (75.3 kg)    Ocular: Sclerae unicteric, pupils equal, round and reactive to light Ear-nose-throat: Oropharynx clear, dentition fair Lymphatic: No cervical or supraclavicular adenopathy Lungs no rales or rhonchi, good excursion bilaterally Heart regular rate and rhythm, no murmur appreciated Abd soft, nontender, positive bowel sounds MSK no focal spinal tenderness, no joint edema Neuro: non-focal, well-oriented, appropriate affect  Lab Results  Component Value Date   WBC 5.3 07/16/2023   HGB 10.1 (L) 07/16/2023   HCT 31.5 (L) 07/16/2023  MCV 83.3 07/16/2023   PLT 249 07/16/2023   Lab Results  Component Value Date   FERRITIN 49 06/19/2023   IRON 97 06/19/2023   TIBC 339 06/19/2023   UIBC 242 06/19/2023   IRONPCTSAT 29 06/19/2023   Lab Results  Component Value Date   RETICCTPCT 0.8 07/16/2023   RBC 3.81 (L) 07/16/2023   No results found for: "KPAFRELGTCHN", "LAMBDASER", "KAPLAMBRATIO" No results found for: "IGGSERUM", "IGA", "IGMSERUM" No results found for: "TOTALPROTELP", "ALBUMINELP", "A1GS", "A2GS", "BETS", "BETA2SER", "GAMS", "MSPIKE", "SPEI"   Chemistry      Component Value Date/Time   NA 135 07/16/2023 1118   K 4.7 07/16/2023 1118   CL 101 07/16/2023 1118   CO2 29 07/16/2023 1118   BUN 18 07/16/2023 1118   CREATININE  1.10 (H) 07/16/2023 1118      Component Value Date/Time   CALCIUM 9.0 07/16/2023 1118   ALKPHOS 41 07/16/2023 1118   AST 24 07/16/2023 1118   ALT 17 07/16/2023 1118   BILITOT 0.3 07/16/2023 1118       Impression and Plan: Kim Grant is a very pleasant 82 yo caucasian female with erythropoietin deficiency anemia.  ESA given due to her Hgb being 10.1 Iron studies pending.   Disposition ESA today RTC monthly for lab (CBC) and injection RTC 3 months APP, labs (CBC, CMP) and injection-[place]  Rushie Chestnut, PA-C 9/23/202412:14 PM

## 2023-07-16 NOTE — Patient Instructions (Signed)

## 2023-07-17 ENCOUNTER — Telehealth: Payer: Self-pay | Admitting: Family Medicine

## 2023-07-17 LAB — ERYTHROPOIETIN: Erythropoietin: 5.1 m[IU]/mL (ref 2.6–18.5)

## 2023-07-17 NOTE — Telephone Encounter (Signed)
Kim Grant [phone]  Pt stated that she could not make an appt with [facility]  Nutrition Due to they need more information on why she needs this visit. Her insurance will not cover without. They want OV notes explaining why.  She would like to speak with Patsy Lager about this. You can reach her after 1:00

## 2023-07-17 NOTE — Telephone Encounter (Signed)
Pt stated that insurance will not pay for the services not unless you have diabetes or kidney disease. She stated that she does have a kidney disease and asked for the phone number to the nutritionist. I provided her with the number from referral. [facility] [place] [phone]

## 2023-07-18 ENCOUNTER — Other Ambulatory Visit: Payer: Self-pay

## 2023-07-18 DIAGNOSIS — D631 Anemia in chronic kidney disease: Secondary | ICD-10-CM

## 2023-07-25 ENCOUNTER — Telehealth: Payer: Self-pay | Admitting: Dietician

## 2023-07-25 ENCOUNTER — Inpatient Hospital Stay: Payer: Medicare Other | Attending: Hematology & Oncology | Admitting: Dietician

## 2023-07-25 DIAGNOSIS — N189 Chronic kidney disease, unspecified: Secondary | ICD-10-CM | POA: Insufficient documentation

## 2023-07-25 DIAGNOSIS — D631 Anemia in chronic kidney disease: Secondary | ICD-10-CM | POA: Insufficient documentation

## 2023-07-25 NOTE — Telephone Encounter (Signed)
Attempted to reach patient for a scheduled remote nutrition consult. Line busy (3 attempts), attempted to send text but mobile# isn't wireless.  Gennaro Africa, RDN, LDN Registered Dietitian, [facility] Part Time Remote (Usual office hours: Tuesday-Thursday) Cell: [phone]

## 2023-07-25 NOTE — Progress Notes (Signed)
Nutrition Assessment: Reached out to patient at home telephone number.    Reason for Assessment: New Patient Assessment   ASSESSMENT: Patient is 14 who is followed by Lucilla Edin for IDA.  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 She relayed that she wanted to live to 89 and she is working to hard to maintain her health.  She thought she wasn't getting enough protein and wanted to know if she should be taking a protein supplement.  She has Barrett's Esophagus, GERD and coexisting conditions that include osteoporosis. She has no food allergies.  She shops for herself. She reports 2 meal per day pattern, Usual foods: Breakfast: toast, couple T PB, scram egg with spinach or rye toast oatmeal with blueberries Lunch/Dinner: Chicken sandwich, 2 pieces bread, lettuce, tomato, and green beans (likes varied protein sources) usually eats about 3oz at her meals Snacks: yogurts, nuts (mostly walnuts, almond and pecans), will buy cookies and overeat  Fluids: coffee (black with sweet and low) 20-30oz and water 40oz   Nutrition Focused Physical Exam: unable to perform NFPE   Medications: MVI and Ca+ supplements   Labs: 9/23 GFR 50    Anthropometrics: weight up slightly  Height: 64" Weight: 171# UBW: 165-170# BMI: 29.37   Estimated Energy Needs  Kcals: 2000 Protein: 47 g Fluid: 2L   NUTRITION DIAGNOSIS: Food and Nutrition Related Knowledge Deficit related to CKD  as evidenced by no prior need for nutrition related information.    INTERVENTION:   Relayed that nutrition services are wrap around service provided at no charge and encouraged continued communication if experiencing any nutritional impact symptoms (NIS). Educated on importance of adequate nourishment with calorie and protein energy intake with nutrient dense foods when possible to maintain weight/strength and QOL.   Encouraged adequate hydration Suggested oral nutrition supplement that provide  less than 10g protein per serving if she wanted  but also didn't recommend she use. Discussed strategies for using snacks to meet protein needs (yogurt, nuts, cheese sticks) Provided contact information for future questions  MONITORING, EVALUATION, GOAL: weight trends, nutrition impact symptoms, PO intake, labs   Next Visit: PRN at patient or provider request.  Gennaro Africa, RDN, LDN Registered Dietitian, [facility] Part Time Remote (Usual office hours: Tuesday-Thursday) Mobile: [phone]

## 2023-08-16 ENCOUNTER — Other Ambulatory Visit: Payer: Self-pay

## 2023-08-16 DIAGNOSIS — D631 Anemia in chronic kidney disease: Secondary | ICD-10-CM

## 2023-08-17 ENCOUNTER — Inpatient Hospital Stay: Payer: Medicare Other

## 2023-08-20 ENCOUNTER — Inpatient Hospital Stay: Payer: Medicare Other

## 2023-08-20 VITALS — BP 137/73 | HR 72 | Temp 97.9°F | Resp 17

## 2023-08-20 DIAGNOSIS — N189 Chronic kidney disease, unspecified: Secondary | ICD-10-CM | POA: Diagnosis not present

## 2023-08-20 DIAGNOSIS — D631 Anemia in chronic kidney disease: Secondary | ICD-10-CM | POA: Diagnosis not present

## 2023-08-20 LAB — CBC WITH DIFFERENTIAL (CANCER CENTER ONLY)
Abs Immature Granulocytes: 0.02 10*3/uL (ref 0.00–0.07)
Basophils Absolute: 0.1 10*3/uL (ref 0.0–0.1)
Basophils Relative: 1 %
Eosinophils Absolute: 0.2 10*3/uL (ref 0.0–0.5)
Eosinophils Relative: 3 %
HCT: 32.6 % — ABNORMAL LOW (ref 36.0–46.0)
Hemoglobin: 10.7 g/dL — ABNORMAL LOW (ref 12.0–15.0)
Immature Granulocytes: 0 %
Lymphocytes Relative: 23 %
Lymphs Abs: 1.5 10*3/uL (ref 0.7–4.0)
MCH: 27.2 pg (ref 26.0–34.0)
MCHC: 32.8 g/dL (ref 30.0–36.0)
MCV: 83 fL (ref 80.0–100.0)
Monocytes Absolute: 0.5 10*3/uL (ref 0.1–1.0)
Monocytes Relative: 8 %
Neutro Abs: 4 10*3/uL (ref 1.7–7.7)
Neutrophils Relative %: 65 %
Platelet Count: 269 10*3/uL (ref 150–400)
RBC: 3.93 MIL/uL (ref 3.87–5.11)
RDW: 15.1 % (ref 11.5–15.5)
WBC Count: 6.2 10*3/uL (ref 4.0–10.5)
nRBC: 0 % (ref 0.0–0.2)

## 2023-08-20 MED ORDER — EPOETIN ALFA-EPBX 40000 UNIT/ML IJ SOLN
40000.0000 [IU] | Freq: Once | INTRAMUSCULAR | Status: AC
  Administered 2023-08-20: 40000 [IU] via SUBCUTANEOUS
  Filled 2023-08-20: qty 1

## 2023-09-14 ENCOUNTER — Inpatient Hospital Stay: Payer: Medicare Other

## 2023-09-18 ENCOUNTER — Inpatient Hospital Stay: Payer: Medicare Other

## 2023-09-18 ENCOUNTER — Other Ambulatory Visit: Payer: Self-pay | Admitting: *Deleted

## 2023-09-18 ENCOUNTER — Inpatient Hospital Stay: Payer: Medicare Other | Attending: Hematology & Oncology

## 2023-09-18 VITALS — BP 124/74 | HR 74 | Temp 98.0°F | Resp 18

## 2023-09-18 DIAGNOSIS — N189 Chronic kidney disease, unspecified: Secondary | ICD-10-CM | POA: Insufficient documentation

## 2023-09-18 DIAGNOSIS — D509 Iron deficiency anemia, unspecified: Secondary | ICD-10-CM

## 2023-09-18 DIAGNOSIS — D631 Anemia in chronic kidney disease: Secondary | ICD-10-CM

## 2023-09-18 LAB — CBC WITH DIFFERENTIAL (CANCER CENTER ONLY)
Abs Immature Granulocytes: 0.02 10*3/uL (ref 0.00–0.07)
Basophils Absolute: 0 10*3/uL (ref 0.0–0.1)
Basophils Relative: 1 %
Eosinophils Absolute: 0.2 10*3/uL (ref 0.0–0.5)
Eosinophils Relative: 4 %
HCT: 33.4 % — ABNORMAL LOW (ref 36.0–46.0)
Hemoglobin: 10.9 g/dL — ABNORMAL LOW (ref 12.0–15.0)
Immature Granulocytes: 0 %
Lymphocytes Relative: 24 %
Lymphs Abs: 1.6 10*3/uL (ref 0.7–4.0)
MCH: 27.5 pg (ref 26.0–34.0)
MCHC: 32.6 g/dL (ref 30.0–36.0)
MCV: 84.1 fL (ref 80.0–100.0)
Monocytes Absolute: 0.6 10*3/uL (ref 0.1–1.0)
Monocytes Relative: 9 %
Neutro Abs: 4.2 10*3/uL (ref 1.7–7.7)
Neutrophils Relative %: 62 %
Platelet Count: 277 10*3/uL (ref 150–400)
RBC: 3.97 MIL/uL (ref 3.87–5.11)
RDW: 13.8 % (ref 11.5–15.5)
WBC Count: 6.6 10*3/uL (ref 4.0–10.5)
nRBC: 0 % (ref 0.0–0.2)

## 2023-09-18 MED ORDER — EPOETIN ALFA-EPBX 40000 UNIT/ML IJ SOLN
40000.0000 [IU] | Freq: Once | INTRAMUSCULAR | Status: AC
  Administered 2023-09-18: 40000 [IU] via SUBCUTANEOUS
  Filled 2023-09-18: qty 1

## 2023-09-18 NOTE — Patient Instructions (Signed)

## 2023-09-19 DIAGNOSIS — Z1231 Encounter for screening mammogram for malignant neoplasm of breast: Secondary | ICD-10-CM | POA: Diagnosis not present

## 2023-09-19 LAB — HM MAMMOGRAPHY

## 2023-09-27 ENCOUNTER — Ambulatory Visit: Payer: Medicare Other | Admitting: Family Medicine
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
## 2023-10-02 DIAGNOSIS — H40053 Ocular hypertension, bilateral: Secondary | ICD-10-CM | POA: Diagnosis not present

## 2023-10-02 DIAGNOSIS — Z7189 Other specified counseling: Secondary | ICD-10-CM | POA: Diagnosis not present

## 2023-10-02 DIAGNOSIS — M810 Age-related osteoporosis without current pathological fracture: Secondary | ICD-10-CM | POA: Diagnosis not present

## 2023-10-18 ENCOUNTER — Other Ambulatory Visit: Payer: Self-pay | Admitting: *Deleted

## 2023-10-18 DIAGNOSIS — D509 Iron deficiency anemia, unspecified: Secondary | ICD-10-CM

## 2023-10-19 ENCOUNTER — Inpatient Hospital Stay: Payer: Medicare Other

## 2023-10-19 ENCOUNTER — Inpatient Hospital Stay: Payer: Medicare Other | Admitting: Family

## 2023-10-25 ENCOUNTER — Inpatient Hospital Stay: Payer: Medicare Other | Attending: Hematology & Oncology

## 2023-10-25 ENCOUNTER — Inpatient Hospital Stay: Payer: Medicare Other

## 2023-10-25 ENCOUNTER — Inpatient Hospital Stay: Payer: Medicare Other | Admitting: Medical Oncology

## 2023-10-25 VITALS — BP 118/61 | HR 63 | Temp 98.1°F | Resp 17 | Ht 64.0 in | Wt 168.0 lb

## 2023-10-25 DIAGNOSIS — D631 Anemia in chronic kidney disease: Secondary | ICD-10-CM | POA: Insufficient documentation

## 2023-10-25 DIAGNOSIS — N189 Chronic kidney disease, unspecified: Secondary | ICD-10-CM | POA: Insufficient documentation

## 2023-10-25 DIAGNOSIS — D508 Other iron deficiency anemias: Secondary | ICD-10-CM | POA: Diagnosis not present

## 2023-10-25 DIAGNOSIS — D509 Iron deficiency anemia, unspecified: Secondary | ICD-10-CM

## 2023-10-25 LAB — CBC WITH DIFFERENTIAL (CANCER CENTER ONLY)
Abs Immature Granulocytes: 0.01 10*3/uL (ref 0.00–0.07)
Basophils Absolute: 0.1 10*3/uL (ref 0.0–0.1)
Basophils Relative: 1 %
Eosinophils Absolute: 0.2 10*3/uL (ref 0.0–0.5)
Eosinophils Relative: 3 %
HCT: 33 % — ABNORMAL LOW (ref 36.0–46.0)
Hemoglobin: 10.5 g/dL — ABNORMAL LOW (ref 12.0–15.0)
Immature Granulocytes: 0 %
Lymphocytes Relative: 25 %
Lymphs Abs: 1.5 10*3/uL (ref 0.7–4.0)
MCH: 26.6 pg (ref 26.0–34.0)
MCHC: 31.8 g/dL (ref 30.0–36.0)
MCV: 83.5 fL (ref 80.0–100.0)
Monocytes Absolute: 0.5 10*3/uL (ref 0.1–1.0)
Monocytes Relative: 8 %
Neutro Abs: 3.8 10*3/uL (ref 1.7–7.7)
Neutrophils Relative %: 63 %
Platelet Count: 256 10*3/uL (ref 150–400)
RBC: 3.95 MIL/uL (ref 3.87–5.11)
RDW: 14.2 % (ref 11.5–15.5)
WBC Count: 6.1 10*3/uL (ref 4.0–10.5)
nRBC: 0 % (ref 0.0–0.2)

## 2023-10-25 LAB — CMP (CANCER CENTER ONLY)
ALT: 18 U/L (ref 0–44)
AST: 24 U/L (ref 15–41)
Albumin: 4.3 g/dL (ref 3.5–5.0)
Alkaline Phosphatase: 41 U/L (ref 38–126)
Anion gap: 8 (ref 5–15)
BUN: 22 mg/dL (ref 8–23)
CO2: 27 mmol/L (ref 22–32)
Calcium: 9.7 mg/dL (ref 8.9–10.3)
Chloride: 98 mmol/L (ref 98–111)
Creatinine: 1.14 mg/dL — ABNORMAL HIGH (ref 0.44–1.00)
GFR, Estimated: 48 mL/min — ABNORMAL LOW (ref >60)
Glucose, Bld: 99 mg/dL (ref 70–99)
Potassium: 4.4 mmol/L (ref 3.5–5.1)
Sodium: 133 mmol/L — ABNORMAL LOW (ref 135–145)
Total Bilirubin: 0.4 mg/dL (ref 0.0–1.2)
Total Protein: 7 g/dL (ref 6.5–8.1)

## 2023-10-25 MED ORDER — EPOETIN ALFA-EPBX 40000 UNIT/ML IJ SOLN
40000.0000 [IU] | Freq: Once | INTRAMUSCULAR | Status: AC
  Administered 2023-10-25: 40000 [IU] via SUBCUTANEOUS
  Filled 2023-10-25: qty 1

## 2023-10-25 NOTE — Patient Instructions (Signed)

## 2023-10-25 NOTE — Progress Notes (Signed)
 Hematology and Oncology Follow Up Visit  Kim Grant [id number] 11-24-1940 83 y.o. 10/25/2023   Principle Diagnosis:  Erythropoietin  deficiency anemia    Current Therapy:        Retacrit  40,000 units SQ for Hgb < 11               Interim History:  Kim Grant is here today for follow-up and injection.   Today she states that she is doing well personally but her great grandchild was just diagnosed with a rare disorder that has been hard on the family.   She is still working out 2-3 times per weeks.  No blood loss, bruising or petechiae noted.  No fever, chills, n/v, cough, rash, dizziness, SOB, chest pain, palpitations, abdominal pain or changes in bowel or bladder habits.  No swelling in her extremities.  No falls or syncope reported.  Appetite and hydration ar good.   Wt Readings from Last 3 Encounters:  10/25/23 168 lb (76.2 kg)  07/16/23 171 lb 1.3 oz (77.6 kg)  06/18/23 172 lb 3.2 oz (78.1 kg)   ECOG Performance Status: 1 - Symptomatic but completely ambulatory  Medications:  Allergies as of 10/25/2023   No Known Allergies      Medication List        Accurate as of October 25, 2023  3:09 PM. If you have any questions, ask your nurse or doctor.          calcium  citrate-vitamin D  315-200 MG-UNIT tablet Commonly known as: CITRACAL+D Take 3 tablets by mouth daily.   citalopram  40 MG tablet Commonly known as: CELEXA  TAKE 1 TABLET BY MOUTH EVERY DAY   denosumab  60 MG/ML Sosy injection Commonly known as: PROLIA  Inject 60 mg into the skin every 6 (six) months.   meclizine  25 MG tablet Commonly known as: ANTIVERT  Take 1 tablet (25 mg total) by mouth 3 (three) times daily as needed for dizziness.   melatonin 5 MG Tabs Take 5 mg by mouth at bedtime as needed.   multivitamin with minerals Tabs tablet Take 1 tablet by mouth daily.   omeprazole 40 MG capsule Commonly known as: PRILOSEC Take 40 mg by mouth daily.   PREVAGEN PO Take by mouth.   rosuvastatin  40  MG tablet Commonly known as: CRESTOR  Take 1 tablet (40 mg total) by mouth daily.        Allergies: No Known Allergies  Past Medical History, Surgical history, Social history, and Family History were reviewed and updated.  Review of Systems: All other 10 point review of systems is negative.   Physical Exam:  height is 5' 4 (1.626 m) and weight is 168 lb (76.2 kg). Her oral temperature is 98.1 F (36.7 C). Her blood pressure is 118/61 and her pulse is 63. Her respiration is 17 and oxygen saturation is 100%.   Wt Readings from Last 3 Encounters:  10/25/23 168 lb (76.2 kg)  07/16/23 171 lb 1.3 oz (77.6 kg)  06/18/23 172 lb 3.2 oz (78.1 kg)   Ocular: Sclerae unicteric, pupils equal, round and reactive to light Ear-nose-throat: Oropharynx clear, dentition fair Lymphatic: No cervical or supraclavicular adenopathy Lungs no rales or rhonchi, good excursion bilaterally Heart regular rate and rhythm, no murmur appreciated Abd soft, nontender, positive bowel sounds MSK no focal spinal tenderness, no joint edema Neuro: non-focal, well-oriented, appropriate affect  Lab Results  Component Value Date   WBC 6.1 10/25/2023   HGB 10.5 (L) 10/25/2023   HCT 33.0 (L) 10/25/2023   MCV  83.5 10/25/2023   PLT 256 10/25/2023   Lab Results  Component Value Date   FERRITIN 63 07/16/2023   IRON 75 07/16/2023   TIBC 360 07/16/2023   UIBC 285 07/16/2023   IRONPCTSAT 21 07/16/2023   Lab Results  Component Value Date   RETICCTPCT 0.8 07/16/2023   RBC 3.95 10/25/2023   No results found for: KPAFRELGTCHN, LAMBDASER, KAPLAMBRATIO No results found for: IGGSERUM, IGA, IGMSERUM No results found for: STEPHANY CARLOTA BENSON MARKEL EARLA JOANNIE DOC VICK, SPEI   Chemistry      Component Value Date/Time   NA 135 07/16/2023 1118   K 4.7 07/16/2023 1118   CL 101 07/16/2023 1118   CO2 29 07/16/2023 1118   BUN 18 07/16/2023 1118   CREATININE 1.10 (H)  07/16/2023 1118      Component Value Date/Time   CALCIUM  9.0 07/16/2023 1118   ALKPHOS 41 07/16/2023 1118   AST 24 07/16/2023 1118   ALT 17 07/16/2023 1118   BILITOT 0.3 07/16/2023 1118     Encounter Diagnoses  Name Primary?   Iron deficiency anemia, unspecified iron deficiency anemia type    Erythropoietin  deficiency anemia Yes    Impression and Plan: Kim Grant is a very pleasant 83 yo caucasian female with erythropoietin  deficiency anemia.   Very sorry to hear about her great grand daughter. I hope that she improves soon.   ESA given due to her Hgb being 10.5 Iron studies pending.   Disposition ESA today RTC monthly for lab (CBC) and injection RTC 3 months APP, labs (CBC, CMP) and injection-[place]  Lauraine CHRISTELLA Dais, PA-C 1/2/20253:09 PM

## 2023-10-31 DIAGNOSIS — H40053 Ocular hypertension, bilateral: Secondary | ICD-10-CM | POA: Diagnosis not present

## 2023-11-21 ENCOUNTER — Other Ambulatory Visit: Payer: Self-pay | Admitting: Medical Oncology

## 2023-11-21 DIAGNOSIS — D509 Iron deficiency anemia, unspecified: Secondary | ICD-10-CM

## 2023-11-21 DIAGNOSIS — D631 Anemia in chronic kidney disease: Secondary | ICD-10-CM

## 2023-11-22 ENCOUNTER — Inpatient Hospital Stay: Payer: Medicare Other

## 2023-11-27 DIAGNOSIS — K080 Exfoliation of teeth due to systemic causes: Secondary | ICD-10-CM | POA: Diagnosis not present

## 2023-12-18 DIAGNOSIS — K080 Exfoliation of teeth due to systemic causes: Secondary | ICD-10-CM | POA: Diagnosis not present

## 2023-12-20 ENCOUNTER — Inpatient Hospital Stay: Payer: Medicare Other

## 2023-12-20 ENCOUNTER — Inpatient Hospital Stay: Payer: Medicare Other | Attending: Hematology & Oncology

## 2023-12-20 VITALS — BP 130/72 | HR 69 | Temp 98.1°F | Resp 17

## 2023-12-20 DIAGNOSIS — D631 Anemia in chronic kidney disease: Secondary | ICD-10-CM | POA: Diagnosis not present

## 2023-12-20 DIAGNOSIS — D509 Iron deficiency anemia, unspecified: Secondary | ICD-10-CM

## 2023-12-20 DIAGNOSIS — N189 Chronic kidney disease, unspecified: Secondary | ICD-10-CM | POA: Diagnosis not present

## 2023-12-20 LAB — CBC
HCT: 32.2 % — ABNORMAL LOW (ref 36.0–46.0)
Hemoglobin: 10.6 g/dL — ABNORMAL LOW (ref 12.0–15.0)
MCH: 27.2 pg (ref 26.0–34.0)
MCHC: 32.9 g/dL (ref 30.0–36.0)
MCV: 82.8 fL (ref 80.0–100.0)
Platelets: 246 10*3/uL (ref 150–400)
RBC: 3.89 MIL/uL (ref 3.87–5.11)
RDW: 16.1 % — ABNORMAL HIGH (ref 11.5–15.5)
WBC: 8.5 10*3/uL (ref 4.0–10.5)
nRBC: 0 % (ref 0.0–0.2)

## 2023-12-20 MED ORDER — EPOETIN ALFA-EPBX 40000 UNIT/ML IJ SOLN
40000.0000 [IU] | Freq: Once | INTRAMUSCULAR | Status: AC
  Administered 2023-12-20: 40000 [IU] via SUBCUTANEOUS
  Filled 2023-12-20: qty 1

## 2023-12-20 NOTE — Patient Instructions (Signed)

## 2023-12-24 ENCOUNTER — Other Ambulatory Visit: Payer: Self-pay | Admitting: Family Medicine

## 2023-12-24 DIAGNOSIS — F330 Major depressive disorder, recurrent, mild: Secondary | ICD-10-CM

## 2023-12-24 DIAGNOSIS — E782 Mixed hyperlipidemia: Secondary | ICD-10-CM

## 2023-12-26 ENCOUNTER — Other Ambulatory Visit: Payer: Self-pay | Admitting: Family Medicine

## 2023-12-26 ENCOUNTER — Ambulatory Visit: Admitting: Family Medicine

## 2023-12-26 ENCOUNTER — Encounter: Payer: Self-pay | Admitting: Family Medicine

## 2023-12-26 VITALS — BP 116/72 | HR 88 | Temp 98.3°F | Wt 176.6 lb

## 2023-12-26 DIAGNOSIS — U071 COVID-19: Secondary | ICD-10-CM

## 2023-12-26 DIAGNOSIS — I350 Nonrheumatic aortic (valve) stenosis: Secondary | ICD-10-CM | POA: Diagnosis not present

## 2023-12-26 DIAGNOSIS — N1831 Chronic kidney disease, stage 3a: Secondary | ICD-10-CM | POA: Diagnosis not present

## 2023-12-26 DIAGNOSIS — D631 Anemia in chronic kidney disease: Secondary | ICD-10-CM

## 2023-12-26 LAB — POCT INFLUENZA A/B
Influenza A, POC: NEGATIVE
Influenza B, POC: NEGATIVE

## 2023-12-26 LAB — POC COVID19 BINAXNOW: SARS Coronavirus 2 Ag: POSITIVE — AB

## 2023-12-26 MED ORDER — FLUTICASONE PROPIONATE 50 MCG/ACT NA SUSP: 2.0000 | Freq: Every day | NASAL | 6 refills | Status: AC

## 2023-12-26 MED ORDER — NIRMATRELVIR/RITONAVIR (PAXLOVID) TABLET (RENAL DOSING): 2.0000 | ORAL_TABLET | Freq: Two times a day (BID) | ORAL | 0 refills | Status: AC

## 2023-12-26 MED ORDER — DM-GUAIFENESIN ER 30-600 MG PO TB12: 1.0000 | ORAL_TABLET | Freq: Two times a day (BID) | ORAL | Status: DC

## 2023-12-26 NOTE — Telephone Encounter (Signed)
  Tried calling patient, but voicemail is full. Will try calling later. Needs to update pharmacy. [facility] is out of paxlovid.

## 2023-12-26 NOTE — Progress Notes (Signed)
 Assessment/Plan:     COVID-19 infection   She tested positive for COVID-19 with symptoms of nasal congestion, headache, cough, sore throat, and mild dyspnea. She denies chest pain and fever. Symptoms began on Monday, and she is managing them with Mucinex and Tylenol. Due to her age and underlying conditions, including aortic stenosis and CKD stage 3, she is at increased risk for severe disease. Discussed Paxlovid to reduce hospitalization risk, with potential side effects of gastrointestinal upset and taste abnormalities. She is hesitant about the COVID vaccine due to family experiences but is informed she does not need the vaccine for the next three months.   - Prescribe Paxlovid for COVID-19 treatment, considering a lower dose due to CKD3.   - Recommend fluticasone nasal spray, two sprays in each nostril once daily, for congestion.   - Advise using Mucinex DM for cough management.   - Continue Tylenol for headache and pain relief.   - Instruct to follow up if symptoms worsen or become severe, and to seek emergency care if necessary.      There are no discontinued medications.  Return if symptoms worsen or fail to improve.    Subjective:   Encounter date: 12/26/2023  Kim Grant is a 83 y.o. female who has Age-related osteoporosis without current pathological fracture; Barrett's esophagus without dysplasia; 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HCC); Mixed hyperlipidemia; Aortic stenosis; Erythropoietin deficiency anemia; Primary osteoarthritis of right knee; Presbycusis; Primary osteoarthritis of left foot; Vertigo; Chronic gastric ulcer; Hiatal hernia; Aortic atherosclerosis (HCC); Small vessel disease (HCC); Routine general medical examination at a health care facility; Memory changes; Obesity with serious comorbidity; CKD stage 3a, GFR 45-59 ml/min (HCC); and COVID-19 on their problem list..   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   She presents with chief complaint of Nasal Congestion (Congestion , headaches , cough, x Monday.  tx with mucinex, tylenol with little to no help.) .    History of Present Illness   Kim Grant is an 83 year old female with aortic stenosis and CKD stage 3 who presents with viral URI symptoms and tested positive for COVID-19.  She has been experiencing symptoms consistent with a viral upper respiratory infection, including nasal congestion, frontal headaches, and cough, since Monday afternoon. She tested positive for COVID-19. No fever, but she has a sore throat, likely due to coughing. No ear popping or clicking. She experiences mild shortness of breath and wheezing but denies any chest pain.  For symptom management, she is taking Mucinex (not the DM version) and Tylenol for her headaches. She has not received a COVID vaccine this year.  She generally enjoys good health and engages in weight lifting and using weight machines. This is the first time she has been sick in one to three years.  She mentions a significant family history involving her great-granddaughter, who was severely ill with a virus that led to neurological complications, including meningitis and encephalitis, requiring hospitalization and lumbar punctures. Her great-granddaughter has since recovered.       Review of  Systems  All  other systems reviewed and are negative.   Past Surgical History:  Procedure Laterality Date   ABDOMINAL HYSTERECTOMY     BREAST SURGERY     KNEE SURGERY Right    ROTATOR CUFF REPAIR      Outpatient Medications Prior to Visit  Medication Sig Dispense Refill   Apoaequorin (PREVAGEN PO) Take by mouth.     calcium citrate-vitamin D (CITRACAL+D) 315-200 MG-UNIT per tablet Take 3 tablets by mouth daily.     citalopram (CELEXA) 40 MG tablet TAKE 1 TABLET BY MOUTH EVERY DAY 90 tablet 3   denosumab (PROLIA) 60 MG/ML SOSY injection Inject 60 mg into the skin every 6 (six) months.     meclizine (ANTIVERT) 25 MG tablet Take 1 tablet (25 mg total) by mouth 3 (three) times daily as needed for dizziness. 15 tablet 0   melatonin 5 MG TABS Take 5 mg by mouth at bedtime as needed.     Multiple Vitamin (MULTIVITAMIN WITH MINERALS) TABS tablet Take 1 tablet by mouth daily.     omeprazole (PRILOSEC) 40 MG capsule Take 40 mg by mouth daily.     rosuvastatin (CRESTOR) 40 MG tablet TAKE 1 TABLET BY MOUTH EVERY DAY FOR CHOLESTEROL 90 tablet 3   No facility-administered medications prior to visit.    Family History  Problem Relation Age of Onset   Cancer Mother        ovarian    Heart disease Father     Social History   Socioeconomic History   Marital status: Widowed    Spouse name: Not on file   Number of children: Not on file   Years of education: Not on file   Highest education level: Not on file  Occupational History   Not on file  Tobacco Use   Smoking status: Former    Current packs/day: 0.00    Average packs/day: 0.5 packs/day for 15.0 years (7.5 ttl pk-yrs)    Types: Cigarettes    Start date: 02/28/1998    Quit date: 02/28/2013    Years since quitting: 10.8    Passive exposure: Never   Smokeless tobacco: Never  Vaping Use   Vaping status: Never Used  Substance and Sexual Activity   Alcohol use: No   Drug use: No   Sexual activity: Not Currently  Other Topics Concern   Not on  file  Social History Narrative   Pt works part time at [employer] hospital in [place] x 18 years. She is widowed, has 2 grown sons. 2 grand daughters, 1 great granddaughter who is 1yo.    Social Drivers of Corporate investment banker Strain: Low Risk  (01/29/2023)   Overall Financial Resource Strain (CARDIA)    Difficulty of Paying Living Expenses: Not hard at all  Food Insecurity: No Food Insecurity (01/29/2023)   Hunger Vital Sign    Worried About Running Out of Food in the Last Year: Never true    Ran Out of Food in the Last Year: Never true  Transportation Needs: No Transportation Needs (01/29/2023)   PRAPARE - Administrator, Civil Service (Medical): No    Lack of Transportation (Non-Medical): No  Physical Activity: Sufficiently Active (01/29/2023)   Exercise Vital Sign    Days of Exercise per Week: 3 days    Minutes of Exercise per Session: 50 min  Stress: No Stress Concern Present (01/29/2023)   [employer] of Occupational Health - Occupational Stress Questionnaire    Feeling  of Stress : Not at all  Social Connections: Not on file  Intimate Partner Violence: Not on file                                                                                                  Objective:  Physical Exam: BP 116/72   Pulse 88   Temp 98.3 F (36.8 C) (Oral)   Wt 176 lb 9.6 oz (80.1 kg)   SpO2 99%   BMI 30.31 kg/m    Physical Exam   HEENT: Throat normal. NECK: No cervical lymphadenopathy.      Physical Exam Constitutional:      General: She is not in acute distress.    Appearance: Normal appearance. She is not ill-appearing or toxic-appearing.  HENT:     Head: Normocephalic and atraumatic.     Right Ear: Hearing, tympanic membrane, ear canal and external ear normal.     Left Ear: Hearing, tympanic membrane, ear canal and external ear normal.     Nose: Congestion present.     Right Sinus: No maxillary sinus tenderness or frontal sinus tenderness.     Left Sinus: No  maxillary sinus tenderness or frontal sinus tenderness.     Mouth/Throat:     Lips: No lesions.     Mouth: Mucous membranes are moist.     Dentition: Normal dentition.     Tongue: No lesions.     Pharynx: Oropharynx is clear. No pharyngeal swelling, posterior oropharyngeal erythema or postnasal drip.     Tonsils: No tonsillar exudate.  Eyes:     General: No scleral icterus.    Extraocular Movements: Extraocular movements intact.  Cardiovascular:     Rate and Rhythm: Normal rate and regular rhythm.     Pulses: Normal pulses.     Heart sounds: Normal heart sounds.  Pulmonary:     Effort: Pulmonary effort is normal. No respiratory distress.     Breath sounds: Normal breath sounds.  Abdominal:     General: Abdomen is flat. Bowel sounds are normal.     Palpations: Abdomen is soft.  Musculoskeletal:        General: Normal range of motion.  Lymphadenopathy:     Cervical: No cervical adenopathy.  Skin:    General: Skin is warm and dry.     Findings: No rash.  Neurological:     General: No focal deficit present.     Mental Status: She is alert and oriented to person, place, and time. Mental status is at baseline.  Psychiatric:        Mood and Affect: Mood normal.        Behavior: Behavior normal.        Thought Content: Thought content normal.        Judgment: Judgment normal.     No results found.  Recent Results (from the past 2160 hours)  CBC with Differential (Cancer Center Only)     Status: Abnormal   Collection Time: 10/25/23  2:42 PM  Result Value Ref Range   WBC Count 6.1 4.0 - 10.5 K/uL   RBC  3.95 3.87 - 5.11 MIL/uL   Hemoglobin 10.5 (L) 12.0 - 15.0 g/dL   HCT 78.2 (L) 95.6 - 21.3 %   MCV 83.5 80.0 - 100.0 fL   MCH 26.6 26.0 - 34.0 pg   MCHC 31.8 30.0 - 36.0 g/dL   RDW 08.6 57.8 - 46.9 %   Platelet Count 256 150 - 400 K/uL   nRBC 0.0 0.0 - 0.2 %   Neutrophils Relative % 63 %   Neutro Abs 3.8 1.7 - 7.7 K/uL   Lymphocytes Relative 25 %   Lymphs Abs 1.5 0.7 -  4.0 K/uL   Monocytes Relative 8 %   Monocytes Absolute 0.5 0.1 - 1.0 K/uL   Eosinophils Relative 3 %   Eosinophils Absolute 0.2 0.0 - 0.5 K/uL   Basophils Relative 1 %   Basophils Absolute 0.1 0.0 - 0.1 K/uL   Immature Granulocytes 0 %   Abs Immature Granulocytes 0.01 0.00 - 0.07 K/uL    Comment: Performed at [facility] at [facility], [address], [place], [place] [address]  CMP (Cancer Center only)     Status: Abnormal   Collection Time: 10/25/23  2:42 PM  Result Value Ref Range   Sodium 133 (L) 135 - 145 mmol/L   Potassium 4.4 3.5 - 5.1 mmol/L   Chloride 98 98 - 111 mmol/L   CO2 27 22 - 32 mmol/L   Glucose, Bld 99 70 - 99 mg/dL    Comment: Glucose reference range applies only to samples taken after fasting for at least 8 hours.   BUN 22 8 - 23 mg/dL   Creatinine 8.41 (H) 3.24 - 1.00 mg/dL   Calcium 9.7 8.9 - 40.1 mg/dL   Total Protein 7.0 6.5 - 8.1 g/dL   Albumin 4.3 3.5 - 5.0 g/dL   AST 24 15 - 41 U/L   ALT 18 0 - 44 U/L   Alkaline Phosphatase 41 38 - 126 U/L   Total Bilirubin 0.4 0.0 - 1.2 mg/dL   GFR, Estimated 48 (L) >60 mL/min    Comment: (NOTE) Calculated using the CKD-EPI Creatinine Equation (2021)    Anion gap 8 5 - 15    Comment: Performed at [facility] at [facility], [address] [place], [place] [address]  CBC     Status: Abnormal   Collection Time: 12/20/23  2:32 PM  Result Value Ref Range   WBC 8.5 4.0 - 10.5 K/uL   RBC 3.89 3.87 - 5.11 MIL/uL   Hemoglobin 10.6 (L) 12.0 - 15.0 g/dL   HCT 36.6 (L) 44.0 - 34.7 %   MCV 82.8 80.0 - 100.0 fL   MCH 27.2 26.0 - 34.0 pg   MCHC 32.9 30.0 - 36.0 g/dL   RDW 42.5 (H) 95.6 - 38.7 %   Platelets 246 150 - 400 K/uL   nRBC 0.0 0.0 - 0.2 %    Comment: Performed at [facility] at [facility], [address] [place], [place] [address]  POC COVID-19 Bin[employer]Now     Status: Abnormal   Collection Time: 12/26/23  4:17 PM  Result Value  Ref Range   SARS Coronavirus 2 Ag Positive (A) Negative  POCT Influenza A/B     Status: None   Collection Time: 12/26/23  4:17 PM  Result Value Ref Range   Influenza A, POC Negative Negative   Influenza B, POC Negative Negative  Garner Nash, MD, MS

## 2023-12-26 NOTE — Patient Instructions (Signed)
 VISIT SUMMARY:  Today, we discussed your recent positive COVID-19 test and your symptoms, which include nasal congestion, headache, cough, sore throat, and mild shortness of breath. We also reviewed your chronic conditions, including aortic stenosis and chronic kidney disease stage 3, and how they may affect your treatment plan.  YOUR PLAN:  -COVID-19 INFECTION: You have tested positive for COVID-19 and are experiencing symptoms such as nasal congestion, headache, cough, sore throat, and mild shortness of breath. COVID-19 is a viral infection that can cause respiratory symptoms. We have prescribed Paxlovid to help reduce the risk of severe disease and hospitalization, considering a lower dose due to your chronic kidney disease. We also recommend using fluticasone nasal spray for congestion, Mucinex DM for cough, and continuing Tylenol for headache and pain relief. Please follow up if your symptoms worsen or become severe, and seek emergency care if necessary.  -AORTIC STENOSIS: Aortic stenosis is a condition where the heart's aortic valve narrows, which can increase the risk of severe outcomes if you contract COVID-19. No changes to your current management plan were made during this visit.  -CHRONIC KIDNEY DISEASE STAGE 3 (CKD3): Chronic kidney disease stage 3 means your kidneys are moderately damaged and not working as well as they should. This condition requires careful monitoring, especially when taking new medications like Paxlovid. We will monitor your kidney function while you are on this medication.  INSTRUCTIONS:  Please take Paxlovid as prescribed, using the lower dose due to your chronic kidney disease. Use fluticasone nasal spray, two sprays in each nostril once daily, for congestion. Take Mucinex DM for cough management and continue using Tylenol for headache and pain relief. Follow up if your symptoms worsen or become severe, and seek emergency care if necessary.  For more information,  you can read your full clinical note, available in your patient portal.

## 2023-12-27 ENCOUNTER — Inpatient Hospital Stay: Payer: Medicare Other

## 2023-12-27 ENCOUNTER — Inpatient Hospital Stay: Payer: Medicare Other | Admitting: Medical Oncology

## 2023-12-27 NOTE — Telephone Encounter (Signed)
 Spoke with Harriett Sine and she states that she had prescription transferred to a [employer], but she is feeling better and feels she no longer needs the medication. She states she will go get it if she starts to feel worse today.

## 2024-01-16 ENCOUNTER — Other Ambulatory Visit: Payer: Self-pay

## 2024-01-16 DIAGNOSIS — D631 Anemia in chronic kidney disease: Secondary | ICD-10-CM

## 2024-01-17 ENCOUNTER — Inpatient Hospital Stay: Payer: Medicare Other

## 2024-01-17 ENCOUNTER — Inpatient Hospital Stay: Payer: Medicare Other | Admitting: Medical Oncology

## 2024-01-17 ENCOUNTER — Encounter: Payer: Self-pay | Admitting: Medical Oncology

## 2024-01-17 ENCOUNTER — Inpatient Hospital Stay

## 2024-01-17 ENCOUNTER — Inpatient Hospital Stay: Attending: Hematology & Oncology

## 2024-01-17 ENCOUNTER — Other Ambulatory Visit: Payer: Self-pay | Admitting: Medical Oncology

## 2024-01-17 ENCOUNTER — Inpatient Hospital Stay (HOSPITAL_BASED_OUTPATIENT_CLINIC_OR_DEPARTMENT_OTHER): Admitting: Medical Oncology

## 2024-01-17 VITALS — BP 116/56 | HR 65 | Resp 18 | Wt 177.8 lb

## 2024-01-17 DIAGNOSIS — N189 Chronic kidney disease, unspecified: Secondary | ICD-10-CM | POA: Diagnosis not present

## 2024-01-17 DIAGNOSIS — D631 Anemia in chronic kidney disease: Secondary | ICD-10-CM

## 2024-01-17 DIAGNOSIS — D509 Iron deficiency anemia, unspecified: Secondary | ICD-10-CM | POA: Diagnosis not present

## 2024-01-17 DIAGNOSIS — E875 Hyperkalemia: Secondary | ICD-10-CM

## 2024-01-17 LAB — CMP (CANCER CENTER ONLY)
ALT: 19 U/L (ref 0–44)
AST: 25 U/L (ref 15–41)
Albumin: 4.5 g/dL (ref 3.5–5.0)
Alkaline Phosphatase: 45 U/L (ref 38–126)
Anion gap: 8 (ref 5–15)
BUN: 24 mg/dL — ABNORMAL HIGH (ref 8–23)
CO2: 28 mmol/L (ref 22–32)
Calcium: 10.3 mg/dL (ref 8.9–10.3)
Chloride: 98 mmol/L (ref 98–111)
Creatinine: 1.18 mg/dL — ABNORMAL HIGH (ref 0.44–1.00)
GFR, Estimated: 46 mL/min — ABNORMAL LOW (ref >60)
Glucose, Bld: 93 mg/dL (ref 70–99)
Potassium: 5.5 mmol/L — ABNORMAL HIGH (ref 3.5–5.1)
Sodium: 134 mmol/L — ABNORMAL LOW (ref 135–145)
Total Bilirubin: 0.4 mg/dL (ref 0.0–1.2)
Total Protein: 6.8 g/dL (ref 6.5–8.1)

## 2024-01-17 LAB — CBC WITH DIFFERENTIAL (CANCER CENTER ONLY)
Abs Immature Granulocytes: 0.09 10*3/uL — ABNORMAL HIGH (ref 0.00–0.07)
Basophils Absolute: 0 10*3/uL (ref 0.0–0.1)
Basophils Relative: 1 %
Eosinophils Absolute: 0.2 10*3/uL (ref 0.0–0.5)
Eosinophils Relative: 2 %
HCT: 31.6 % — ABNORMAL LOW (ref 36.0–46.0)
Hemoglobin: 10.1 g/dL — ABNORMAL LOW (ref 12.0–15.0)
Immature Granulocytes: 1 %
Lymphocytes Relative: 26 %
Lymphs Abs: 1.7 10*3/uL (ref 0.7–4.0)
MCH: 26.9 pg (ref 26.0–34.0)
MCHC: 32 g/dL (ref 30.0–36.0)
MCV: 84 fL (ref 80.0–100.0)
Monocytes Absolute: 0.5 10*3/uL (ref 0.1–1.0)
Monocytes Relative: 7 %
Neutro Abs: 4.1 10*3/uL (ref 1.7–7.7)
Neutrophils Relative %: 63 %
Platelet Count: 278 10*3/uL (ref 150–400)
RBC: 3.76 MIL/uL — ABNORMAL LOW (ref 3.87–5.11)
RDW: 15.9 % — ABNORMAL HIGH (ref 11.5–15.5)
WBC Count: 6.6 10*3/uL (ref 4.0–10.5)
nRBC: 0 % (ref 0.0–0.2)

## 2024-01-17 LAB — FERRITIN: Ferritin: 71 ng/mL (ref 11–307)

## 2024-01-17 LAB — RETICULOCYTES
Immature Retic Fract: 1.9 % — ABNORMAL LOW (ref 2.3–15.9)
RBC.: 3.72 MIL/uL — ABNORMAL LOW (ref 3.87–5.11)
Retic Count, Absolute: 29 10*3/uL (ref 19.0–186.0)
Retic Ct Pct: 0.8 % (ref 0.4–3.1)

## 2024-01-17 MED ORDER — EPOETIN ALFA-EPBX 40000 UNIT/ML IJ SOLN
40000.0000 [IU] | Freq: Once | INTRAMUSCULAR | Status: AC
  Administered 2024-01-17: 40000 [IU] via SUBCUTANEOUS
  Filled 2024-01-17: qty 1

## 2024-01-17 NOTE — Progress Notes (Addendum)
 Hematology and Oncology Follow Up Visit  Kim Grant [id number] 1941/06/03 83 y.o. 01/17/2024   Principle Diagnosis:  Erythropoietin deficiency anemia    Current Therapy:        Retacrit 40,000 units SQ for Hgb < 11               Interim History:  Kim Grant is here today for follow-up and injection.   Today she states that she is doing well. She has finally gotten almost back to baseline after getting COVID-19 3 weeks ago. She is happy to report that her granddaughter is doing much better as well.   Just started back at the gym after recovering from COVID. Goal is to return to the gym every 2-3 times per weeks.  No blood loss, bruising or petechiae noted.  No fever, chills, n/v, cough, rash, dizziness, SOB, chest pain, palpitations, abdominal pain or changes in bowel or bladder habits.  No swelling in her extremities.  No falls or syncope reported.  Appetite and hydration ar good.   Wt Readings from Last 3 Encounters:  01/17/24 177 lb 12.8 oz (80.6 kg)  12/26/23 176 lb 9.6 oz (80.1 kg)  10/25/23 168 lb (76.2 kg)   ECOG Performance Status: 1 - Symptomatic but completely ambulatory  Medications:  Allergies as of 01/17/2024   No Known Allergies      Medication List        Accurate as of January 17, 2024  2:29 PM. If you have any questions, ask your nurse or doctor.          calcium citrate-vitamin D 315-200 MG-UNIT tablet Commonly known as: CITRACAL+D Take 3 tablets by mouth daily.   citalopram 40 MG tablet Commonly known as: CELEXA TAKE 1 TABLET BY MOUTH EVERY DAY   denosumab 60 MG/ML Sosy injection Commonly known as: PROLIA Inject 60 mg into the skin every 6 (six) months.   dextromethorphan-guaiFENesin 30-600 MG 12hr tablet Commonly known as: MUCINEX DM Take 1 tablet by mouth 2 (two) times daily.   fluticasone 50 MCG/ACT nasal spray Commonly known as: FLONASE Place 2 sprays into both nostrils daily.   meclizine 25 MG tablet Commonly known as:  ANTIVERT Take 1 tablet (25 mg total) by mouth 3 (three) times daily as needed for dizziness.   melatonin 5 MG Tabs Take 5 mg by mouth at bedtime as needed.   multivitamin with minerals Tabs tablet Take 1 tablet by mouth daily.   omeprazole 40 MG capsule Commonly known as: PRILOSEC Take 40 mg by mouth daily.   PREVAGEN PO Take by mouth.   rosuvastatin 40 MG tablet Commonly known as: CRESTOR TAKE 1 TABLET BY MOUTH EVERY DAY FOR CHOLESTEROL        Allergies: No Known Allergies  Past Medical History, Surgical history, Social history, and Family History were reviewed and updated.  Review of Systems: All other 10 point review of systems is negative.   Physical Exam:  weight is 177 lb 12.8 oz (80.6 kg). Her blood pressure is 116/56 (abnormal) and her pulse is 65. Her respiration is 18 and oxygen saturation is 98%.   Wt Readings from Last 3 Encounters:  01/17/24 177 lb 12.8 oz (80.6 kg)  12/26/23 176 lb 9.6 oz (80.1 kg)  10/25/23 168 lb (76.2 kg)   Ocular: Sclerae unicteric, pupils equal, round and reactive to light Ear-nose-throat: Oropharynx clear, dentition fair Lymphatic: No cervical or supraclavicular adenopathy Lungs no rales or rhonchi, good excursion bilaterally Heart regular rate and rhythm,  no murmur appreciated Abd soft, nontender, positive bowel sounds MSK no focal spinal tenderness, no joint edema Neuro: non-focal, well-oriented, appropriate affect  Lab Results  Component Value Date   WBC 6.6 01/17/2024   HGB 10.1 (L) 01/17/2024   HCT 31.6 (L) 01/17/2024   MCV 84.0 01/17/2024   PLT 278 01/17/2024   Lab Results  Component Value Date   FERRITIN 63 07/16/2023   IRON 75 07/16/2023   TIBC 360 07/16/2023   UIBC 285 07/16/2023   IRONPCTSAT 21 07/16/2023   Lab Results  Component Value Date   RETICCTPCT 0.8 01/17/2024   RBC 3.76 (L) 01/17/2024   RBC 3.72 (L) 01/17/2024   No results found for: "KPAFRELGTCHN", "LAMBDASER", "KAPLAMBRATIO" No results  found for: "IGGSERUM", "IGA", "IGMSERUM" No results found for: "TOTALPROTELP", "ALBUMINELP", "A1GS", "A2GS", "BETS", "BETA2SER", "GAMS", "MSPIKE", "SPEI"   Chemistry      Component Value Date/Time   NA 133 (L) 10/25/2023 1442   K 4.4 10/25/2023 1442   CL 98 10/25/2023 1442   CO2 27 10/25/2023 1442   BUN 22 10/25/2023 1442   CREATININE 1.14 (H) 10/25/2023 1442      Component Value Date/Time   CALCIUM 9.7 10/25/2023 1442   ALKPHOS 41 10/25/2023 1442   AST 24 10/25/2023 1442   ALT 18 10/25/2023 1442   BILITOT 0.4 10/25/2023 1442     Encounter Diagnoses  Name Primary?   Erythropoietin deficiency anemia Yes   Iron deficiency anemia, unspecified iron deficiency anemia type     Impression and Plan: Kim Grant is a very pleasant 83 yo caucasian female with erythropoietin deficiency anemia.   ESA given due to her Hgb being 10.1 CMP pending Iron studies pending.   Disposition ESA today RTC monthly for lab (CBC) and injection RTC 3 months APP, labs (CBC, CMP) and injection-[place]  Rushie Chestnut, PA-C 3/27/20252:29 PM

## 2024-01-17 NOTE — Patient Instructions (Signed)

## 2024-01-18 LAB — IRON AND IRON BINDING CAPACITY (CC-WL,HP ONLY)
Iron: 103 ug/dL (ref 28–170)
Saturation Ratios: 27 % (ref 10.4–31.8)
TIBC: 379 ug/dL (ref 250–450)
UIBC: 276 ug/dL (ref 148–442)

## 2024-01-30 DIAGNOSIS — I350 Nonrheumatic aortic (valve) stenosis: Secondary | ICD-10-CM | POA: Diagnosis not present

## 2024-01-30 DIAGNOSIS — I359 Nonrheumatic aortic valve disorder, unspecified: Secondary | ICD-10-CM | POA: Diagnosis not present

## 2024-01-30 DIAGNOSIS — E7800 Pure hypercholesterolemia, unspecified: Secondary | ICD-10-CM | POA: Diagnosis not present

## 2024-01-31 DIAGNOSIS — I2510 Atherosclerotic heart disease of native coronary artery without angina pectoris: Secondary | ICD-10-CM | POA: Diagnosis not present

## 2024-01-31 DIAGNOSIS — I517 Cardiomegaly: Secondary | ICD-10-CM | POA: Diagnosis not present

## 2024-02-01 DIAGNOSIS — K2270 Barrett's esophagus without dysplasia: Secondary | ICD-10-CM | POA: Diagnosis not present

## 2024-02-01 DIAGNOSIS — K219 Gastro-esophageal reflux disease without esophagitis: Secondary | ICD-10-CM | POA: Diagnosis not present

## 2024-02-05 ENCOUNTER — Encounter: Admitting: Family Medicine

## 2024-02-18 ENCOUNTER — Inpatient Hospital Stay: Attending: Hematology & Oncology

## 2024-02-18 ENCOUNTER — Inpatient Hospital Stay

## 2024-02-18 DIAGNOSIS — D631 Anemia in chronic kidney disease: Secondary | ICD-10-CM | POA: Insufficient documentation

## 2024-02-18 DIAGNOSIS — D509 Iron deficiency anemia, unspecified: Secondary | ICD-10-CM | POA: Diagnosis not present

## 2024-02-18 DIAGNOSIS — N189 Chronic kidney disease, unspecified: Secondary | ICD-10-CM | POA: Diagnosis not present

## 2024-02-18 DIAGNOSIS — E875 Hyperkalemia: Secondary | ICD-10-CM

## 2024-02-18 LAB — BASIC METABOLIC PANEL - CANCER CENTER ONLY
Anion gap: 9 (ref 5–15)
BUN: 25 mg/dL — ABNORMAL HIGH (ref 8–23)
CO2: 25 mmol/L (ref 22–32)
Calcium: 10.4 mg/dL — ABNORMAL HIGH (ref 8.9–10.3)
Chloride: 101 mmol/L (ref 98–111)
Creatinine: 1.16 mg/dL — ABNORMAL HIGH (ref 0.44–1.00)
GFR, Estimated: 47 mL/min — ABNORMAL LOW (ref >60)
Glucose, Bld: 106 mg/dL — ABNORMAL HIGH (ref 70–99)
Potassium: 5 mmol/L (ref 3.5–5.1)
Sodium: 135 mmol/L (ref 135–145)

## 2024-02-18 LAB — CBC
HCT: 33.7 % — ABNORMAL LOW (ref 36.0–46.0)
Hemoglobin: 11.1 g/dL — ABNORMAL LOW (ref 12.0–15.0)
MCH: 27.1 pg (ref 26.0–34.0)
MCHC: 32.9 g/dL (ref 30.0–36.0)
MCV: 82.4 fL (ref 80.0–100.0)
Platelets: 267 10*3/uL (ref 150–400)
RBC: 4.09 MIL/uL (ref 3.87–5.11)
RDW: 14.8 % (ref 11.5–15.5)
WBC: 6.1 10*3/uL (ref 4.0–10.5)
nRBC: 0 % (ref 0.0–0.2)

## 2024-02-18 NOTE — Progress Notes (Signed)
 Hemoglobin noted at 11.1 on todays labs Copy of labs given to patient. Pt instructed to follow current schedule and to call our office should issues occur. Pt verbalized understanding of instructions. Voices no occur issues or problems.

## 2024-03-14 ENCOUNTER — Other Ambulatory Visit: Payer: Self-pay

## 2024-03-14 DIAGNOSIS — D631 Anemia in chronic kidney disease: Secondary | ICD-10-CM

## 2024-03-18 ENCOUNTER — Inpatient Hospital Stay: Attending: Hematology & Oncology

## 2024-03-18 ENCOUNTER — Inpatient Hospital Stay

## 2024-04-01 ENCOUNTER — Inpatient Hospital Stay

## 2024-04-01 ENCOUNTER — Inpatient Hospital Stay: Attending: Hematology & Oncology

## 2024-04-01 VITALS — BP 122/82 | HR 63 | Temp 98.0°F | Resp 18

## 2024-04-01 DIAGNOSIS — D631 Anemia in chronic kidney disease: Secondary | ICD-10-CM | POA: Insufficient documentation

## 2024-04-01 DIAGNOSIS — N1831 Chronic kidney disease, stage 3a: Secondary | ICD-10-CM | POA: Insufficient documentation

## 2024-04-01 LAB — CBC WITH DIFFERENTIAL (CANCER CENTER ONLY)
Abs Immature Granulocytes: 0.01 10*3/uL (ref 0.00–0.07)
Basophils Absolute: 0.1 10*3/uL (ref 0.0–0.1)
Basophils Relative: 1 %
Eosinophils Absolute: 0.2 10*3/uL (ref 0.0–0.5)
Eosinophils Relative: 3 %
HCT: 31.5 % — ABNORMAL LOW (ref 36.0–46.0)
Hemoglobin: 10.5 g/dL — ABNORMAL LOW (ref 12.0–15.0)
Immature Granulocytes: 0 %
Lymphocytes Relative: 21 %
Lymphs Abs: 1.4 10*3/uL (ref 0.7–4.0)
MCH: 27.5 pg (ref 26.0–34.0)
MCHC: 33.3 g/dL (ref 30.0–36.0)
MCV: 82.5 fL (ref 80.0–100.0)
Monocytes Absolute: 0.7 10*3/uL (ref 0.1–1.0)
Monocytes Relative: 10 %
Neutro Abs: 4.2 10*3/uL (ref 1.7–7.7)
Neutrophils Relative %: 65 %
Platelet Count: 250 10*3/uL (ref 150–400)
RBC: 3.82 MIL/uL — ABNORMAL LOW (ref 3.87–5.11)
RDW: 14.5 % (ref 11.5–15.5)
WBC Count: 6.5 10*3/uL (ref 4.0–10.5)
nRBC: 0 % (ref 0.0–0.2)

## 2024-04-01 LAB — CMP (CANCER CENTER ONLY)
ALT: 16 U/L (ref 0–44)
AST: 22 U/L (ref 15–41)
Albumin: 4.6 g/dL (ref 3.5–5.0)
Alkaline Phosphatase: 43 U/L (ref 38–126)
Anion gap: 7 (ref 5–15)
BUN: 24 mg/dL — ABNORMAL HIGH (ref 8–23)
CO2: 27 mmol/L (ref 22–32)
Calcium: 10.1 mg/dL (ref 8.9–10.3)
Chloride: 102 mmol/L (ref 98–111)
Creatinine: 1.27 mg/dL — ABNORMAL HIGH (ref 0.44–1.00)
GFR, Estimated: 42 mL/min — ABNORMAL LOW (ref >60)
Glucose, Bld: 100 mg/dL — ABNORMAL HIGH (ref 70–99)
Potassium: 5.1 mmol/L (ref 3.5–5.1)
Sodium: 136 mmol/L (ref 135–145)
Total Bilirubin: 0.4 mg/dL (ref 0.0–1.2)
Total Protein: 7.2 g/dL (ref 6.5–8.1)

## 2024-04-01 MED ORDER — EPOETIN ALFA-EPBX 40000 UNIT/ML IJ SOLN
40000.0000 [IU] | Freq: Once | INTRAMUSCULAR | Status: AC
  Administered 2024-04-01: 40000 [IU] via SUBCUTANEOUS
  Filled 2024-04-01: qty 1

## 2024-04-01 NOTE — Patient Instructions (Signed)

## 2024-04-17 ENCOUNTER — Inpatient Hospital Stay

## 2024-04-17 ENCOUNTER — Inpatient Hospital Stay: Admitting: Medical Oncology

## 2024-04-24 DIAGNOSIS — M2012 Hallux valgus (acquired), left foot: Secondary | ICD-10-CM | POA: Diagnosis not present

## 2024-04-24 DIAGNOSIS — L853 Xerosis cutis: Secondary | ICD-10-CM | POA: Diagnosis not present

## 2024-04-29 DIAGNOSIS — H40053 Ocular hypertension, bilateral: Secondary | ICD-10-CM | POA: Diagnosis not present

## 2024-04-30 ENCOUNTER — Other Ambulatory Visit: Payer: Self-pay | Admitting: Medical Oncology

## 2024-04-30 DIAGNOSIS — D509 Iron deficiency anemia, unspecified: Secondary | ICD-10-CM

## 2024-04-30 DIAGNOSIS — D631 Anemia in chronic kidney disease: Secondary | ICD-10-CM

## 2024-05-01 ENCOUNTER — Inpatient Hospital Stay: Admitting: Medical Oncology

## 2024-05-01 ENCOUNTER — Inpatient Hospital Stay

## 2024-05-01 ENCOUNTER — Inpatient Hospital Stay: Attending: Hematology & Oncology

## 2024-05-01 ENCOUNTER — Encounter: Payer: Self-pay | Admitting: Medical Oncology

## 2024-05-01 VITALS — BP 129/62 | HR 66 | Temp 98.1°F | Resp 18 | Ht 64.0 in | Wt 173.4 lb

## 2024-05-01 DIAGNOSIS — N1831 Chronic kidney disease, stage 3a: Secondary | ICD-10-CM | POA: Diagnosis not present

## 2024-05-01 DIAGNOSIS — D631 Anemia in chronic kidney disease: Secondary | ICD-10-CM | POA: Diagnosis not present

## 2024-05-01 DIAGNOSIS — D509 Iron deficiency anemia, unspecified: Secondary | ICD-10-CM

## 2024-05-01 LAB — IRON AND IRON BINDING CAPACITY (CC-WL,HP ONLY)
Iron: 91 ug/dL (ref 28–170)
Saturation Ratios: 25 % (ref 10.4–31.8)
TIBC: 370 ug/dL (ref 250–450)
UIBC: 279 ug/dL (ref 148–442)

## 2024-05-01 LAB — CMP (CANCER CENTER ONLY)
ALT: 18 U/L (ref 0–44)
AST: 25 U/L (ref 15–41)
Albumin: 4.4 g/dL (ref 3.5–5.0)
Alkaline Phosphatase: 44 U/L (ref 38–126)
Anion gap: 8 (ref 5–15)
BUN: 24 mg/dL — ABNORMAL HIGH (ref 8–23)
CO2: 27 mmol/L (ref 22–32)
Calcium: 10.2 mg/dL (ref 8.9–10.3)
Chloride: 100 mmol/L (ref 98–111)
Creatinine: 1.23 mg/dL — ABNORMAL HIGH (ref 0.44–1.00)
GFR, Estimated: 44 mL/min — ABNORMAL LOW (ref >60)
Glucose, Bld: 96 mg/dL (ref 70–99)
Potassium: 5.1 mmol/L (ref 3.5–5.1)
Sodium: 135 mmol/L (ref 135–145)
Total Bilirubin: 0.4 mg/dL (ref 0.0–1.2)
Total Protein: 7.1 g/dL (ref 6.5–8.1)

## 2024-05-01 LAB — CBC WITH DIFFERENTIAL (CANCER CENTER ONLY)
Abs Immature Granulocytes: 0.03 K/uL (ref 0.00–0.07)
Basophils Absolute: 0 K/uL (ref 0.0–0.1)
Basophils Relative: 1 %
Eosinophils Absolute: 0.2 K/uL (ref 0.0–0.5)
Eosinophils Relative: 4 %
HCT: 32.6 % — ABNORMAL LOW (ref 36.0–46.0)
Hemoglobin: 10.6 g/dL — ABNORMAL LOW (ref 12.0–15.0)
Immature Granulocytes: 1 %
Lymphocytes Relative: 22 %
Lymphs Abs: 1.3 K/uL (ref 0.7–4.0)
MCH: 26.8 pg (ref 26.0–34.0)
MCHC: 32.5 g/dL (ref 30.0–36.0)
MCV: 82.5 fL (ref 80.0–100.0)
Monocytes Absolute: 0.5 K/uL (ref 0.1–1.0)
Monocytes Relative: 9 %
Neutro Abs: 3.8 K/uL (ref 1.7–7.7)
Neutrophils Relative %: 63 %
Platelet Count: 288 K/uL (ref 150–400)
RBC: 3.95 MIL/uL (ref 3.87–5.11)
RDW: 14.8 % (ref 11.5–15.5)
WBC Count: 5.8 K/uL (ref 4.0–10.5)
nRBC: 0 % (ref 0.0–0.2)

## 2024-05-01 LAB — RETICULOCYTES
Immature Retic Fract: 3.7 % (ref 2.3–15.9)
RBC.: 3.95 MIL/uL (ref 3.87–5.11)
Retic Count, Absolute: 22.1 K/uL (ref 19.0–186.0)
Retic Ct Pct: 0.6 % (ref 0.4–3.1)

## 2024-05-01 LAB — FERRITIN: Ferritin: 86 ng/mL (ref 11–307)

## 2024-05-01 MED ORDER — EPOETIN ALFA-EPBX 40000 UNIT/ML IJ SOLN
40000.0000 [IU] | Freq: Once | INTRAMUSCULAR | Status: AC
  Administered 2024-05-01: 40000 [IU] via SUBCUTANEOUS
  Filled 2024-05-01: qty 1

## 2024-05-01 NOTE — Progress Notes (Signed)
 Hematology and Oncology Follow Up Visit  Kim Grant [id number] Apr 13, 1941 83 y.o. 05/01/2024   Principle Diagnosis:  Erythropoietin  deficiency anemia    Current Therapy:        Retacrit  40,000 units SQ for Hgb < 11               Interim History:  Kim Grant is here today for follow-up and injection.   Today she states that she is doing ok other than having increased trouble with vertigo episodes due to the weather systems and heat. This started many years ago and she was evaluated for a potential stroke at the time in the ER. Fortunately no stroke was seen and symptoms are mainly limited to the hottest parts of the summer. She denies any falls.  No blood loss, bruising or petechiae noted.  No fever, chills, n/v, cough, rash, SOB, chest pain, palpitations, abdominal pain or changes in bowel or bladder habits.  No swelling in her extremities.  No falls or syncope reported.  Appetite and hydration ar good.   Wt Readings from Last 3 Encounters:  05/01/24 173 lb 6.4 oz (78.7 kg)  01/17/24 177 lb 12.8 oz (80.6 kg)  12/26/23 176 lb 9.6 oz (80.1 kg)   ECOG Performance Status: 1 - Symptomatic but completely ambulatory  Medications:  Allergies as of 05/01/2024   No Known Allergies      Medication List        Accurate as of May 01, 2024 11:50 AM. If you have any questions, ask your nurse or doctor.          calcium  citrate-vitamin D  315-200 MG-UNIT tablet Commonly known as: CITRACAL+D Take 3 tablets by mouth daily.   citalopram  40 MG tablet Commonly known as: CELEXA  TAKE 1 TABLET BY MOUTH EVERY DAY   denosumab  60 MG/ML Sosy injection Commonly known as: PROLIA  Inject 60 mg into the skin every 6 (six) months.   famotidine 40 MG tablet Commonly known as: PEPCID Take 40 mg by mouth daily.   fluticasone  50 MCG/ACT nasal spray Commonly known as: FLONASE  Place 2 sprays into both nostrils daily.   latanoprost 0.005 % ophthalmic solution Commonly known as: XALATAN Place 1  drop into the right eye at bedtime.   meclizine  25 MG tablet Commonly known as: ANTIVERT  Take 1 tablet (25 mg total) by mouth 3 (three) times daily as needed for dizziness.   melatonin 5 MG Tabs Take 5 mg by mouth at bedtime as needed.   multivitamin with minerals Tabs tablet Take 1 tablet by mouth daily.   omeprazole 40 MG capsule Commonly known as: PRILOSEC Take 40 mg by mouth daily.   PREVAGEN PO Take by mouth.   rosuvastatin  40 MG tablet Commonly known as: CRESTOR  TAKE 1 TABLET BY MOUTH EVERY DAY FOR CHOLESTEROL        Allergies: No Known Allergies  Past Medical History, Surgical history, Social history, and Family History were reviewed and updated.  Review of Systems: All other 10 point review of systems is negative.   Physical Exam:  height is 5' 4 (1.626 m) and weight is 173 lb 6.4 oz (78.7 kg). Her oral temperature is 98.1 F (36.7 C). Her blood pressure is 129/62 and her pulse is 66. Her respiration is 18 and oxygen saturation is 98%.   Wt Readings from Last 3 Encounters:  05/01/24 173 lb 6.4 oz (78.7 kg)  01/17/24 177 lb 12.8 oz (80.6 kg)  12/26/23 176 lb 9.6 oz (80.1 kg)  Constitutional: NAD, no slurred speech  Ocular: Sclerae unicteric, pupils equal, round and reactive to light Ear-nose-throat: Oropharynx clear, dentition fair Lymphatic: No cervical or supraclavicular adenopathy Lungs no rales or rhonchi, good excursion bilaterally Heart regular rate and rhythm, no murmur appreciated Abd soft, nontender, positive bowel sounds MSK no focal spinal tenderness, no joint edema Neuro: non-focal, well-oriented, appropriate affect. Cranial nerves are intact. Normal gait.   Lab Results  Component Value Date   WBC 5.8 05/01/2024   HGB 10.6 (L) 05/01/2024   HCT 32.6 (L) 05/01/2024   MCV 82.5 05/01/2024   PLT 288 05/01/2024   Lab Results  Component Value Date   FERRITIN 71 01/17/2024   IRON 103 01/17/2024   TIBC 379 01/17/2024   UIBC 276 01/17/2024    IRONPCTSAT 27 01/17/2024   Lab Results  Component Value Date   RETICCTPCT 0.6 05/01/2024   RBC 3.95 05/01/2024   RBC 3.95 05/01/2024   No results found for: KPAFRELGTCHN, LAMBDASER, KAPLAMBRATIO No results found for: IGGSERUM, IGA, IGMSERUM No results found for: STEPHANY CARLOTA BENSON MARKEL EARLA JOANNIE DOC VICK, SPEI   Chemistry      Component Value Date/Time   NA 136 04/01/2024 1259   K 5.1 04/01/2024 1259   CL 102 04/01/2024 1259   CO2 27 04/01/2024 1259   BUN 24 (H) 04/01/2024 1259   CREATININE 1.27 (H) 04/01/2024 1259      Component Value Date/Time   CALCIUM  10.1 04/01/2024 1259   ALKPHOS 43 04/01/2024 1259   AST 22 04/01/2024 1259   ALT 16 04/01/2024 1259   BILITOT 0.4 04/01/2024 1259     No diagnosis found.  Impression and Plan: Kim Grant is a very pleasant 83 yo caucasian female with erythropoietin  deficiency anemia.   ESA given due to her Hgb being 10.6 Iron studies pending.  I have suggested a visit to her ENT for discussions of her vertigo. We discussed safety and fall precautions. She has been advised to not drive while having an episode of vertigo to avoid injury to self/others.   Disposition ESA today RTC monthly for lab (CBC) and injection RTC 3 months APP, labs (CBC, CMP) and injection-[place]  Lauraine CHRISTELLA Dais, PA-C 7/10/202511:50 AM

## 2024-05-01 NOTE — Patient Instructions (Signed)

## 2024-05-05 ENCOUNTER — Ambulatory Visit: Payer: Self-pay | Admitting: Medical Oncology

## 2024-05-13 DIAGNOSIS — Z7189 Other specified counseling: Secondary | ICD-10-CM | POA: Diagnosis not present

## 2024-05-13 DIAGNOSIS — M810 Age-related osteoporosis without current pathological fracture: Secondary | ICD-10-CM | POA: Diagnosis not present

## 2024-05-30 ENCOUNTER — Other Ambulatory Visit: Payer: Self-pay

## 2024-05-30 DIAGNOSIS — D509 Iron deficiency anemia, unspecified: Secondary | ICD-10-CM

## 2024-05-30 DIAGNOSIS — D631 Anemia in chronic kidney disease: Secondary | ICD-10-CM

## 2024-06-02 ENCOUNTER — Inpatient Hospital Stay: Attending: Hematology & Oncology

## 2024-06-02 ENCOUNTER — Ambulatory Visit: Admitting: Medical Oncology

## 2024-06-02 ENCOUNTER — Inpatient Hospital Stay

## 2024-06-02 DIAGNOSIS — D631 Anemia in chronic kidney disease: Secondary | ICD-10-CM | POA: Insufficient documentation

## 2024-06-02 DIAGNOSIS — N189 Chronic kidney disease, unspecified: Secondary | ICD-10-CM | POA: Diagnosis not present

## 2024-06-02 DIAGNOSIS — D509 Iron deficiency anemia, unspecified: Secondary | ICD-10-CM

## 2024-06-02 LAB — CBC (CANCER CENTER ONLY)
HCT: 34 % — ABNORMAL LOW (ref 36.0–46.0)
Hemoglobin: 11 g/dL — ABNORMAL LOW (ref 12.0–15.0)
MCH: 27 pg (ref 26.0–34.0)
MCHC: 32.4 g/dL (ref 30.0–36.0)
MCV: 83.3 fL (ref 80.0–100.0)
Platelet Count: 294 K/uL (ref 150–400)
RBC: 4.08 MIL/uL (ref 3.87–5.11)
RDW: 14.3 % (ref 11.5–15.5)
WBC Count: 7.5 K/uL (ref 4.0–10.5)
nRBC: 0 % (ref 0.0–0.2)

## 2024-06-02 NOTE — Progress Notes (Signed)
 Patients Hemoglobin noted at 11.0 on todays labs. No shot indicated per MD orders. Pt given current copy of labs and current copy of schedule. Instructed to follow current schedule and to call if issue occur. Pt verbalized understanding of said above instructions

## 2024-06-06 ENCOUNTER — Ambulatory Visit (INDEPENDENT_AMBULATORY_CARE_PROVIDER_SITE_OTHER)

## 2024-06-06 VITALS — BP 110/60 | HR 80 | Temp 98.1°F | Ht 64.5 in | Wt 174.4 lb

## 2024-06-06 DIAGNOSIS — Z0000 Encounter for general adult medical examination without abnormal findings: Secondary | ICD-10-CM | POA: Diagnosis not present

## 2024-06-06 NOTE — Progress Notes (Signed)
 Subjective:   Cherissa Hook is a 83 y.o. who presents for a Medicare Wellness preventive visit.  As a reminder, Annual Wellness Visits don't include a physical exam, and some assessments may be limited, especially if this visit is performed virtually. We may recommend an in-person follow-up visit with your provider if needed.  Visit Complete: In person    Persons Participating in Visit: Patient.  AWV Questionnaire: No: Patient Medicare AWV questionnaire was not completed prior to this visit.  Cardiac Risk Factors include: advanced age (>68men, >15 women);dyslipidemia     Objective:    Today's Vitals   06/06/24 1540  BP: 110/60  Pulse: 80  Temp: 98.1 F (36.7 C)  TempSrc: Oral  SpO2: 92%  Weight: 174 lb 6.4 oz (79.1 kg)  Height: 5' 4.5 (1.638 m)   Body mass index is 29.47 kg/m.     06/06/2024    3:51 PM 05/01/2024   11:38 AM 01/17/2024    2:17 PM 10/25/2023    2:51 PM 07/16/2023   11:38 AM 04/03/2023    2:34 PM 01/29/2023    2:01 PM  Advanced Directives  Does Patient Have a Medical Advance Directive? Yes Yes Yes Yes Yes No Yes  Type of Estate agent of [place];Living will Healthcare Power of [place];Living will Healthcare Power of [place];Living will Healthcare Power of [place];Living will Healthcare Power of [place];Living will  Healthcare Power of [place];Living will  Does patient want to make changes to medical advance directive?  No - Patient declined   No - Patient declined    Copy of Healthcare Power of Attorney in Chart? No - copy requested No - copy requested No - copy requested  No - copy requested  No - copy requested  Would patient like information on creating a medical advance directive?  No - Patient declined No - Patient declined  No - Patient declined No - Patient declined     Current Medications (verified) Outpatient Encounter Medications as of 06/06/2024  Medication Sig   Apoaequorin (PREVAGEN PO) Take by mouth.   calcium   citrate-vitamin D  (CITRACAL+D) 315-200 MG-UNIT per tablet Take 3 tablets by mouth daily.   citalopram  (CELEXA ) 40 MG tablet TAKE 1 TABLET BY MOUTH EVERY DAY   denosumab  (PROLIA ) 60 MG/ML SOSY injection Inject 60 mg into the skin every 6 (six) months.   famotidine (PEPCID) 40 MG tablet Take 40 mg by mouth daily.   fluticasone  (FLONASE ) 50 MCG/ACT nasal spray Place 2 sprays into both nostrils daily.   latanoprost (XALATAN) 0.005 % ophthalmic solution Place 1 drop into the right eye at bedtime.   meclizine  (ANTIVERT ) 25 MG tablet Take 1 tablet (25 mg total) by mouth 3 (three) times daily as needed for dizziness.   melatonin 5 MG TABS Take 5 mg by mouth at bedtime as needed.   Multiple Vitamin (MULTIVITAMIN WITH MINERALS) TABS tablet Take 1 tablet by mouth daily.   omeprazole (PRILOSEC) 40 MG capsule Take 40 mg by mouth daily.   rosuvastatin  (CRESTOR ) 40 MG tablet TAKE 1 TABLET BY MOUTH EVERY DAY FOR CHOLESTEROL   No facility-administered encounter medications on file as of 06/06/2024.    Allergies (verified) Patient has no known allergies.   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 HPI:  Complains of stopped up Bilateral ear(s). EXAM: shows cerumen impaction. PLAN: Cerumen removed with various instruments (curettes, suction) giving subjective relief.  External canals and tympanic membranes are otherwise normal.  Return as needed.   Chronic bronchitis (HCC) 08/04/2014   Closed fracture of nasal bone with routine healing 11/02/2021   Last Assessment & Plan:  Formatting of this note might be different from the original.  Nasal fracture. Fell with facial trauma about 3 weeks ago.  CT of the face is reviewed and shows minimally displaced nasal fracture.  Denies any nasal obstruction. EXAM shows mild deviation of the nasal dorsum.  Subtle.  Intranasal exam is unremarkable. 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   Past Surgical History:  Procedure Laterality Date   ABDOMINAL HYSTERECTOMY     BREAST SURGERY     KNEE SURGERY Right    ROTATOR CUFF REPAIR     Family History  Problem Relation Age of Onset   Cancer Mother        ovarian    Heart disease Father    Social History   Socioeconomic History   Marital status: Widowed    Spouse name: Not on file   Number of children: Not on file   Years of education: Not on file   Highest education level: Not on file  Occupational History   Not on file  Tobacco Use   Smoking status: Former    Current packs/day: 0.00    Average packs/day: 0.5 packs/day for 15.0 years (7.5 ttl pk-yrs)    Types: Cigarettes    Start date: 02/28/1998    Quit date: 02/28/2013    Years since quitting: 11.2    Passive exposure: Never   Smokeless tobacco: Never  Vaping Use   Vaping status: Never Used  Substance and Sexual Activity   Alcohol use: No   Drug use: No   Sexual activity: Not Currently  Other Topics Concern   Not on file  Social History Narrative   Pt works part time at [employer] hospital in [place] x 18 years. She is widowed, has 2 grown sons. 2 grand daughters, 1 great  granddaughter who is 1yo.    Social Drivers of Corporate investment banker Strain: Low Risk  (06/06/2024)   Overall Financial Resource Strain (CARDIA)    Difficulty of Paying Living Expenses: Not hard at all  Food Insecurity: No Food Insecurity (06/06/2024)   Hunger Vital Sign    Worried About Running Out of Food in the Last Year: Never true    Ran Out of Food in the Last Year: Never true  Transportation Needs: No Transportation Needs (06/06/2024)   PRAPARE - Administrator, Civil Service (Medical): No    Lack of Transportation (Non-Medical): No  Physical Activity: Insufficiently Active (06/06/2024)   Exercise Vital Sign  Days of Exercise per Week: 2 days    Minutes of Exercise per Session: 60 min  Stress: No Stress Concern Present (06/06/2024)   [employer] of Occupational Health - Occupational Stress Questionnaire    Feeling of Stress: Not at all  Social Connections: Socially Isolated (06/06/2024)   Social Connection and Isolation Panel    Frequency of Communication with Friends and Family: More than three times a week    Frequency of Social Gatherings with Friends and Family: More than three times a week    Attends Religious Services: Never    Database administrator or Organizations: No    Attends Banker Meetings: Never    Marital Status: Widowed    Tobacco Counseling Counseling given: Not Answered    Clinical Intake:  Pre-visit preparation completed: Yes  Pain : No/denies pain     Nutritional Status: BMI 25 -29 Overweight Nutritional Risks: None Diabetes: No  No results found for: HGBA1C   How often do you need to have someone help you when you read instructions, pamphlets, or other written materials from your doctor or pharmacy?: 1 - Never  Interpreter Needed?: No  Information entered by :: NAllen LPN   Activities of Daily Living     06/06/2024    3:42 PM  In your present state of health, do you have any difficulty  performing the following activities:  Hearing? 1  Comment has hearing aids  Vision? 0  Difficulty concentrating or making decisions? 0  Walking or climbing stairs? 0  Dressing or bathing? 0  Doing errands, shopping? 0  Preparing Food and eating ? N  Using the Toilet? N  In the past six months, have you accidently leaked urine? Y  Comment wears a pad  Do you have problems with loss of bowel control? N  Managing your Medications? N  Managing your Finances? N  Housekeeping or managing your Housekeeping? N    Patient Care Team: Sebastian Beverley NOVAK, MD as PCP - General (Family Medicine)  I have updated your Care Teams any recent Medical Services you may have received from other providers in the past year.     Assessment:   This is a routine wellness examination for [place].  Hearing/Vision screen Hearing Screening - Comments:: Has hearing aids that are maintained Vision Screening - Comments:: Regular eye exams, [facility]   Goals Addressed             This Visit's Progress    Patient Stated       06/06/2024, wants to lose 10 pounds       Depression Screen     06/06/2024    3:53 PM 05/01/2024   11:40 AM 06/18/2023   10:37 AM 01/29/2023    2:01 PM 12/27/2022   10:57 AM 02/17/2022    2:21 PM 11/18/2020    2:00 PM  PHQ 2/9 Scores  PHQ - 2 Score 0 0 1 0 0 0 0  PHQ- 9 Score 1  5  1   0    Fall Risk     06/06/2024    3:52 PM 01/29/2023    2:01 PM 12/27/2022   10:06 AM 02/17/2022    2:21 PM  Fall Risk   Falls in the past year? 0 0 0 1  Number falls in past yr: 0 0 0 1  Injury with Fall? 0 0 0 1  Risk for fall due to : Medication side effect Medication side effect  Follow up Falls evaluation completed;Falls prevention discussed Falls prevention discussed;Education provided;Falls evaluation completed      MEDICARE RISK AT HOME:  Medicare Risk at Home Any stairs in or around the home?: No If so, are there any without handrails?: No Home free of loose throw rugs in  walkways, pet beds, electrical cords, etc?: Yes Adequate lighting in your home to reduce risk of falls?: Yes Life alert?: No Use of a cane, walker or w/c?: No Grab bars in the bathroom?: No Shower chair or bench in shower?: No Elevated toilet seat or a handicapped toilet?: Yes  TIMED UP AND GO:  Was the test performed?  Yes  Length of time to ambulate 10 feet: 5 sec Gait steady and fast without use of assistive device  Cognitive Function: 6CIT completed        06/06/2024    3:56 PM 01/29/2023    2:04 PM  6CIT Screen  What Year? 0 points 0 points  What month? 0 points 0 points  What time? 0 points 0 points  Count back from 20 0 points 0 points  Months in reverse 0 points 0 points  Repeat phrase 2 points 0 points  Total Score 2 points 0 points    Immunizations Immunization History  Administered Date(s) Administered   Influenza, High Dose Seasonal PF 07/10/2016, 07/05/2018   Influenza-Unspecified 08/27/2007, 09/09/2010, 07/23/2015, 07/10/2016, 07/16/2019, 08/04/2019   PFIZER Comirnaty(Gray Top)Covid-19 Tri-Sucrose Vaccine 07/27/2020   PFIZER(Purple Top)SARS-COV-2 Vaccination 12/01/2019, 12/23/2019   Pneumococcal Conjugate-13 08/28/2017   Pneumococcal Polysaccharide-23 09/19/2010   Td 02/09/2007   Tdap 03/20/2019    Screening Tests Health Maintenance  Topic Date Due   Zoster Vaccines- Shingrix (1 of 2) Never done   COVID-19 Vaccine (4 - 2024-25 season) 06/24/2023   INFLUENZA VACCINE  05/23/2024   Medicare Annual Wellness (AWV)  06/06/2025   DTaP/Tdap/Td (3 - Td or Tdap) 03/19/2029   Pneumococcal Vaccine: 50+ Years  Completed   DEXA SCAN  Completed   HPV VACCINES  Aged Out   Meningococcal B Vaccine  Aged Out    Health Maintenance  Health Maintenance Due  Topic Date Due   Zoster Vaccines- Shingrix (1 of 2) Never done   COVID-19 Vaccine (4 - 2024-25 season) 06/24/2023   INFLUENZA VACCINE  05/23/2024   Health Maintenance Items Addressed: Due for flu and covid  vaccine.  Additional Screening:  Vision Screening: Recommended annual ophthalmology exams for early detection of glaucoma and other disorders of the eye. Would you like a referral to an eye doctor? No    Dental Screening: Recommended annual dental exams for proper oral hygiene  Community Resource Referral / Chronic Care Management: CRR required this visit?  No   CCM required this visit?  No   Plan:    I have personally reviewed and noted the following in the patient's chart:   Medical and social history Use of alcohol, tobacco or illicit drugs  Current medications and supplements including opioid prescriptions. Patient is not currently taking opioid prescriptions. Functional ability and status Nutritional status Physical activity Advanced directives List of other physicians Hospitalizations, surgeries, and ER visits in previous 12 months Vitals Screenings to include cognitive, depression, and falls Referrals and appointments  In addition, I have reviewed and discussed with patient certain preventive protocols, quality metrics, and best practice recommendations. A written personalized care plan for preventive services as well as general preventive health recommendations were provided to patient.   Ardella FORBES Dawn, LPN   1/84/7974  After Visit Summary: (In Person-Printed) AVS printed and given to the patient  Notes: Nothing significant to report at this time.

## 2024-06-06 NOTE — Patient Instructions (Signed)
 Ms. Kim Grant , Thank you for taking time out of your busy schedule to complete your Annual Wellness Visit with me. I enjoyed our conversation and look forward to speaking with you again next year. I, as well as your care team,  appreciate your ongoing commitment to your health goals. Please review the following plan we discussed and let me know if I can assist you in the future. Your Game plan/ To Do List    Referrals: If you haven't heard from the office you've been referred to, please reach out to them at the phone provided.   Follow up Visits: We will see or speak with you next year for your Next Medicare AWV with our clinical staff Have you seen your provider in the last 6 months (3 months if uncontrolled diabetes)? Yes  Clinician Recommendations:  Aim for 30 minutes of exercise or brisk walking, 6-8 glasses of water, and 5 servings of fruits and vegetables each day.       This is a list of the screenings recommended for you:  Health Maintenance  Topic Date Due   Zoster (Shingles) Vaccine (1 of 2) Never done   COVID-19 Vaccine (4 - 2024-25 season) 06/24/2023   Flu Shot  05/23/2024   Medicare Annual Wellness Visit  06/06/2025   DTaP/Tdap/Td vaccine (3 - Td or Tdap) 03/19/2029   Pneumococcal Vaccine for age over 46  Completed   DEXA scan (bone density measurement)  Completed   HPV Vaccine  Aged Out   Meningitis B Vaccine  Aged Out    Advanced directives: (Copy Requested) Please bring a copy of your health care power of attorney and living will to the office to be added to your chart at your convenience. You can mail to [facility] [address] [place], [place] [address] or email to [email or web address] Advance Care Planning is important because it:  [x]  Makes sure you receive the medical care that is consistent with your values, goals, and preferences  [x]  It provides guidance to your family and loved ones and reduces their decisional burden about whether or  not they are making the right decisions based on your wishes.  Follow the link provided in your after visit summary or read over the paperwork we have mailed to you to help you started getting your Advance Directives in place. If you need assistance in completing these, please reach out to us  so that we can help you!  See attachments for Preventive Care and Fall Prevention Tips.

## 2024-07-03 ENCOUNTER — Ambulatory Visit: Admitting: Family Medicine

## 2024-07-03 ENCOUNTER — Ambulatory Visit

## 2024-07-03 ENCOUNTER — Inpatient Hospital Stay: Attending: Hematology & Oncology

## 2024-07-03 ENCOUNTER — Inpatient Hospital Stay

## 2024-07-03 ENCOUNTER — Encounter: Payer: Self-pay | Admitting: Family Medicine

## 2024-07-03 VITALS — BP 124/62 | HR 63 | Temp 97.4°F | Resp 18 | Wt 175.2 lb

## 2024-07-03 VITALS — BP 110/63 | HR 76 | Temp 98.0°F | Resp 18

## 2024-07-03 DIAGNOSIS — M810 Age-related osteoporosis without current pathological fracture: Secondary | ICD-10-CM | POA: Diagnosis not present

## 2024-07-03 DIAGNOSIS — M19072 Primary osteoarthritis, left ankle and foot: Secondary | ICD-10-CM | POA: Diagnosis not present

## 2024-07-03 DIAGNOSIS — K2270 Barrett's esophagus without dysplasia: Secondary | ICD-10-CM | POA: Diagnosis not present

## 2024-07-03 DIAGNOSIS — R053 Chronic cough: Secondary | ICD-10-CM | POA: Diagnosis not present

## 2024-07-03 DIAGNOSIS — N1831 Chronic kidney disease, stage 3a: Secondary | ICD-10-CM | POA: Insufficient documentation

## 2024-07-03 DIAGNOSIS — M1711 Unilateral primary osteoarthritis, right knee: Secondary | ICD-10-CM

## 2024-07-03 DIAGNOSIS — Z0000 Encounter for general adult medical examination without abnormal findings: Secondary | ICD-10-CM

## 2024-07-03 DIAGNOSIS — K257 Chronic gastric ulcer without hemorrhage or perforation: Secondary | ICD-10-CM

## 2024-07-03 DIAGNOSIS — D631 Anemia in chronic kidney disease: Secondary | ICD-10-CM

## 2024-07-03 DIAGNOSIS — I350 Nonrheumatic aortic (valve) stenosis: Secondary | ICD-10-CM | POA: Diagnosis not present

## 2024-07-03 DIAGNOSIS — R058 Other specified cough: Secondary | ICD-10-CM

## 2024-07-03 DIAGNOSIS — E782 Mixed hyperlipidemia: Secondary | ICD-10-CM

## 2024-07-03 DIAGNOSIS — Z87891 Personal history of nicotine dependence: Secondary | ICD-10-CM | POA: Diagnosis not present

## 2024-07-03 DIAGNOSIS — I700 Atherosclerosis of aorta: Secondary | ICD-10-CM | POA: Diagnosis not present

## 2024-07-03 DIAGNOSIS — D509 Iron deficiency anemia, unspecified: Secondary | ICD-10-CM

## 2024-07-03 DIAGNOSIS — F330 Major depressive disorder, recurrent, mild: Secondary | ICD-10-CM

## 2024-07-03 LAB — CBC
HCT: 32 % — ABNORMAL LOW (ref 36.0–46.0)
Hemoglobin: 10.4 g/dL — ABNORMAL LOW (ref 12.0–15.0)
MCH: 27 pg (ref 26.0–34.0)
MCHC: 32.5 g/dL (ref 30.0–36.0)
MCV: 83.1 fL (ref 80.0–100.0)
Platelets: 281 K/uL (ref 150–400)
RBC: 3.85 MIL/uL — ABNORMAL LOW (ref 3.87–5.11)
RDW: 14.5 % (ref 11.5–15.5)
WBC: 7.3 K/uL (ref 4.0–10.5)
nRBC: 0 % (ref 0.0–0.2)

## 2024-07-03 LAB — COMPREHENSIVE METABOLIC PANEL WITH GFR
ALT: 22 U/L (ref 0–35)
AST: 27 U/L (ref 0–37)
Albumin: 4.4 g/dL (ref 3.5–5.2)
Alkaline Phosphatase: 43 U/L (ref 39–117)
BUN: 19 mg/dL (ref 6–23)
CO2: 26 meq/L (ref 19–32)
Calcium: 9.6 mg/dL (ref 8.4–10.5)
Chloride: 97 meq/L (ref 96–112)
Creatinine, Ser: 1.02 mg/dL (ref 0.40–1.20)
GFR: 51.27 mL/min — ABNORMAL LOW (ref >60.00)
Glucose, Bld: 77 mg/dL (ref 70–99)
Potassium: 4.1 meq/L (ref 3.5–5.1)
Sodium: 129 meq/L — ABNORMAL LOW (ref 135–145)
Total Bilirubin: 0.4 mg/dL (ref 0.2–1.2)
Total Protein: 7.2 g/dL (ref 6.0–8.3)

## 2024-07-03 LAB — CMP (CANCER CENTER ONLY)
ALT: 25 U/L (ref 0–44)
AST: 31 U/L (ref 15–41)
Albumin: 4.5 g/dL (ref 3.5–5.0)
Alkaline Phosphatase: 54 U/L (ref 38–126)
Anion gap: 10 (ref 5–15)
BUN: 20 mg/dL (ref 8–23)
CO2: 27 mmol/L (ref 22–32)
Calcium: 9.7 mg/dL (ref 8.9–10.3)
Chloride: 100 mmol/L (ref 98–111)
Creatinine: 1.13 mg/dL — ABNORMAL HIGH (ref 0.44–1.00)
GFR, Estimated: 48 mL/min — ABNORMAL LOW (ref >60)
Glucose, Bld: 110 mg/dL — ABNORMAL HIGH (ref 70–99)
Potassium: 4.4 mmol/L (ref 3.5–5.1)
Sodium: 137 mmol/L (ref 135–145)
Total Bilirubin: 0.3 mg/dL (ref 0.0–1.2)
Total Protein: 6.9 g/dL (ref 6.5–8.1)

## 2024-07-03 LAB — CBC WITH DIFFERENTIAL/PLATELET
Basophils Absolute: 0 K/uL (ref 0.0–0.1)
Basophils Relative: 0.7 % (ref 0.0–3.0)
Eosinophils Absolute: 0.3 K/uL (ref 0.0–0.7)
Eosinophils Relative: 4.4 % (ref 0.0–5.0)
HCT: 33.1 % — ABNORMAL LOW (ref 36.0–46.0)
Hemoglobin: 11 g/dL — ABNORMAL LOW (ref 12.0–15.0)
Lymphocytes Relative: 21.8 % (ref 12.0–46.0)
Lymphs Abs: 1.3 K/uL (ref 0.7–4.0)
MCHC: 33.2 g/dL (ref 30.0–36.0)
MCV: 81.2 fl (ref 78.0–100.0)
Monocytes Absolute: 0.5 K/uL (ref 0.1–1.0)
Monocytes Relative: 7.7 % (ref 3.0–12.0)
Neutro Abs: 3.9 K/uL (ref 1.4–7.7)
Neutrophils Relative %: 65.4 % (ref 43.0–77.0)
Platelets: 277 K/uL (ref 150.0–400.0)
RBC: 4.07 Mil/uL (ref 3.87–5.11)
RDW: 15.3 % (ref 11.5–15.5)
WBC: 6 K/uL (ref 4.0–10.5)

## 2024-07-03 LAB — RETIC PANEL
Immature Retic Fract: 4.6 % (ref 2.3–15.9)
RBC.: 3.79 MIL/uL — ABNORMAL LOW (ref 3.87–5.11)
Retic Count, Absolute: 32.6 K/uL (ref 19.0–186.0)
Retic Ct Pct: 0.9 % (ref 0.4–3.1)
Reticulocyte Hemoglobin: 32 pg (ref >27.9)

## 2024-07-03 LAB — LIPID PANEL
Cholesterol: 168 mg/dL (ref 0–200)
HDL: 74.2 mg/dL (ref >39.00)
LDL Cholesterol: 71 mg/dL (ref 0–99)
NonHDL: 94.22
Total CHOL/HDL Ratio: 2
Triglycerides: 114 mg/dL (ref 0.0–149.0)
VLDL: 22.8 mg/dL (ref 0.0–40.0)

## 2024-07-03 LAB — HEMOGLOBIN A1C: Hgb A1c MFr Bld: 6.2 % (ref 4.6–6.5)

## 2024-07-03 LAB — FERRITIN: Ferritin: 82 ng/mL (ref 11–307)

## 2024-07-03 LAB — MICROALBUMIN / CREATININE URINE RATIO
Creatinine,U: 47.5 mg/dL
Microalb Creat Ratio: UNDETERMINED mg/g (ref 0.0–30.0)
Microalb, Ur: <0.7 mg/dL

## 2024-07-03 LAB — IRON AND IRON BINDING CAPACITY (CC-WL,HP ONLY)
Iron: 98 ug/dL (ref 28–170)
Saturation Ratios: 26 % (ref 10.4–31.8)
TIBC: 374 ug/dL (ref 250–450)
UIBC: 276 ug/dL

## 2024-07-03 LAB — B12 AND FOLATE PANEL
Folate: >22.9 ng/mL (ref >5.9)
Vitamin B-12: 725 pg/mL (ref 211–911)

## 2024-07-03 LAB — TSH: TSH: 0.84 u[IU]/mL (ref 0.35–5.50)

## 2024-07-03 LAB — PHOSPHORUS: Phosphorus: 4.2 mg/dL (ref 2.3–4.6)

## 2024-07-03 MED ORDER — EPOETIN ALFA-EPBX 40000 UNIT/ML IJ SOLN
40000.0000 [IU] | Freq: Once | INTRAMUSCULAR | Status: AC
  Administered 2024-07-03: 40000 [IU] via SUBCUTANEOUS
  Filled 2024-07-03: qty 1

## 2024-07-03 NOTE — Patient Instructions (Signed)

## 2024-07-03 NOTE — Patient Instructions (Addendum)
  VISIT SUMMARY: Today, we reviewed your chronic conditions and conducted your annual physical exam. We discussed your kidney disease, cardiovascular health, musculoskeletal pain, gastrointestinal issues, chronic cough, and mood. We also talked about general health maintenance, including weight management and exercise.  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 -We will order tests for PTH, phosphorus, calcium , and vitamin D  levels. -We will recheck your kidney function with a comprehensive metabolic panel (CMP), urinalysis, and microalbumin/creatinine ratio. -We will also order iron studies, B12, folate, and a complete blood count (CBC) with differential.  ATHEROSCLEROSIS OF AORTA WITH MIXED HYPERLIPIDEMIA: You have atherosclerosis of the aorta and mixed hyperlipidemia, which require regular monitoring. -Continue taking rosuvastatin  40 mg daily. -We will order a lipid panel and hemoglobin A1c test. -We will also order a TSH test to rule out thyroid  disease affecting your lipid levels.  NONRHEUMATIC AORTIC VALVE STENOSIS: You have nonrheumatic aortic valve stenosis but no current symptoms like chest pain or shortness of breath.  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 You have no history of pathological fractures.  BARRETT'S ESOPHAGUS WITHOUT DYSPLASIA: You have Barrett's esophagus, which is managed with omeprazole. No dysplasia is present. -Continue taking omeprazole 40 mg daily.  CHRONIC COUGH: You have an intermittent chronic cough, possibly due to postnasal drip or GERD. -Use Flonase   consistently, two sprays in each nostril daily. -We will order a chest x-ray to evaluate your lungs.  DEPRESSION: Your depression is managed with citalopram , and your mood is stable with occasional tiredness. -Continue taking citalopram  40 mg daily.  GENERAL HEALTH MAINTENANCE: We discussed weight management, exercise, and the potential benefits of probiotics for gastrointestinal health. -Consider joining Weight Watchers for weight management. -Continue regular exercise, including weight training and walking. -Try kombucha or increase your yogurt intake for probiotics.                      Contains text generated by Abridge.                                 Contains text generated by Abridge.

## 2024-07-03 NOTE — Progress Notes (Signed)
 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 Anemia managed with erythropoietin  through hematology. 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 Lipid levels and cardiovascular risk factors require regular monitoring. 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  Age-related osteoporosis without pathological fracture Osteoporosis managed with calcium , vitamin D , and Prolia . No history of pathological fractures.  Barrett's esophagus without dysplasia Barrett's esophagus managed with omeprazole. No dysplasia present. - Continue omeprazole 40 mg daily  Chronic cough Intermittent chronic cough possibly due to postnasal drip or GERD, persistent for a couple of years with seasonal variation. GERD managed with famotidine and omeprazole. Postnasal drip is a likely contributor. - Encourage consistent use of Flonase , two sprays in each nostril daily - Order chest x-ray to evaluate lungs  Depression Depression managed with citalopram . Mood is stable with occasional tiredness, but no significant depressive symptoms reported. - Continue citalopram  40 mg daily  General Health  Maintenance Discussion on weight management and exercise. Encouraged participation in Weight Watchers and continuation of regular exercise. Discussed potential benefits of probiotics for gastrointestinal health. - Encourage joining [employer] Watchers for [employer] management - Encourage regular exercise, including weight training and walking - Recommend trying kombucha or increasing yogurt intake for probiotics     There are no discontinued medications.  Patient Counseling(The following topics were reviewed and/or handout was given):  -Nutrition: Stressed importance of moderation in sodium/caffeine intake, saturated fat and cholesterol, caloric balance, sufficient intake of fresh fruits, vegetables, and fiber.  -Stressed the importance of regular exercise.   -Substance Abuse: Discussed cessation/primary prevention of tobacco, alcohol, or other drug use; driving or other dangerous activities under the influence; availability of treatment for abuse.   -Injury prevention: Discussed safety belts, safety helmets, smoke detector, smoking near bedding or upholstery.   -Sexuality: Discussed sexually transmitted diseases, partner selection, use of condoms, avoidance of unintended pregnancy and contraceptive alternatives.   -Dental health: Discussed importance of regular tooth brushing, flossing, and dental visits.  -Health maintenance and immunizations reviewed. Please refer to Health maintenance section.  Return in about 6 months (around 12/31/2024) for HLD, mood.        Subjective:   Encounter date: 07/03/2024  Chief Complaint  Patient presents with   Medical Management of Chronic Issues    6 month follow up. Pt is fasting today  HM due- vaccinations   chest x-ray    Pt is requesting chest x-ray due to being a former smoker     Discussed the use of AI scribe software for clinical note transcription with the patient, who gave verbal consent to proceed.  History of Present Illness Kim Grant is an 83 year old female who presents for an annual physical exam and follow-up on her chronic conditions.  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 - Follows with hematology for erythropoietin  management - Undergoes monthly blood work to monitor hemoglobin levels, typically ranging from 10.4 to 10.5 g/dL - Scheduled to see hematologist this afternoon  Cardiovascular disease and hyperlipidemia - Non-rheumatic aortic valve stenosis and aortic atherosclerosis - Takes rosuvastatin  40 mg daily for aortic atherosclerosis and mixed hyperlipidemia - No chest pain or shortness of breath - Blood pressure today 124/62 mmHg  Musculoskeletal pain and osteoporosis - Primary osteoarthritis affecting multiple joints, including right knee and left foot - Age-related osteoporosis without pathologic fracture - Takes Citracal plus D daily and receives Prolia  60 mg twice a year for osteoporosis - Exercises regularly: works out twice a week with weights and walks on a treadmill - History of displaced fracture of the surgical neck of the arm, previously treated with therapy, continues to manage with exercises  Gastroesophageal reflux and barrett's esophagus - Barrett's esophagus without dysplasia - Takes omeprazole 40 mg daily and famotidine 40 mg at night for reflux management  Chronic cough and upper airway symptoms - Chronic intermittent cough over the past few years, often associated with seasonal changes - Suspects cough may be related to postnasal drip or GERD - Uses Flonase  intermittently  Mood disturbance and fatigue - History of depression, takes citalopram  40 mg daily - Mood generally stable, occasional fatigue - Maintains regular contact with family, including granddaughter and sons  Gastrointestinal symptoms - Recent episode of diarrhea earlier in the week, resolved - Felt 'washed out' the following  day after diarrhea - Attributes loose stools to reflux medication and diet high in salads - Consumes yogurt daily for probiotics       06/06/2024    3:53 PM 05/01/2024   11:40 AM 06/18/2023   10:37 AM 01/29/2023    2:01 PM 12/27/2022   10:57 AM  Depression screen PHQ 2/9  Decreased Interest 0 0 1 0 0  Down, Depressed, Hopeless 0 0 0 0 0  PHQ - 2 Score 0 0 1 0 0  Altered sleeping 0  0  0  Tired, decreased energy 1  1  0  Change in appetite 0  3  1  Feeling bad or failure about yourself  0  0  0  Trouble concentrating 0  0  0  Moving slowly or fidgety/restless 0  0  0  Suicidal thoughts 0  0  0  PHQ-9 Score 1  5  1   Difficult doing work/chores Not difficult at all  Somewhat difficult  Not difficult at all       06/18/2023   10:38 AM 12/27/2022   10:57 AM 11/18/2020    2:04 PM  GAD 7 : Generalized Anxiety Score  Nervous, Anxious, on Edge 0 0 0  Control/stop worrying 0 0 0  Worry too much - different things 0 0 0  Trouble relaxing 0 0 0  Restless 0 0 0  Easily annoyed or irritable 0 0 0  Afraid - awful might happen 0 0 0  Total GAD 7 Score 0 0 0  Anxiety Difficulty Somewhat difficult Not difficult at all Not difficult at all    Health Maintenance Due  Topic Date Due   Zoster Vaccines- Shingrix (1 of 2) Never done   Influenza Vaccine  05/23/2024   COVID-19 Vaccine (4 - 2025-26 season) 06/23/2024      PMH:  The following were reviewed and entered/updated in epic: Past Medical History:  Diagnosis Date   Anterior tibialis  tendinitis 04/18/2016   Arthritis of knee 10/19/2020   Formatting of this note might be different from the original. 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 HPI:  Complains of stopped up Bilateral ear(s). EXAM: shows cerumen impaction. PLAN: Cerumen removed with  various instruments (curettes, suction) giving subjective relief.  External canals and tympanic membranes are otherwise normal.  Return as needed.   Chronic bronchitis (HCC) 08/04/2014   Closed fracture of nasal bone with routine healing 11/02/2021   Last Assessment & Plan:  Formatting of this note might be different from the original. Nasal fracture. Fell with facial trauma about 3 weeks ago.  CT of the face is reviewed and shows minimally displaced nasal fracture.  Denies any nasal obstruction. EXAM shows mild deviation of the nasal dorsum.  Subtle.  Intranasal exam is unremarkable. 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   Barrett's esophagus without dysplasia 11/25/2018   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HCC) 05/25/2017   Primary osteoarthritis of left foot 12/02/2015   Age-related osteoporosis without current pathological fracture 08/04/2014   Mixed  hyperlipidemia 08/04/2014   Aortic stenosis 08/04/2014   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     calcium  citrate-vitamin D  (CITRACAL+D) 315-200 MG-UNIT per tablet Take 3 tablets by mouth daily.     citalopram  (CELEXA ) 40 MG tablet TAKE 1 TABLET BY MOUTH EVERY DAY 90 tablet 3   denosumab  (PROLIA ) 60 MG/ML SOSY injection Inject 60 mg into the skin every 6 (six) months.     famotidine (PEPCID) 40 MG tablet Take 40 mg by mouth daily.     fluticasone  (FLONASE ) 50 MCG/ACT nasal spray Place 2 sprays into both nostrils daily. 16 g 6   latanoprost (XALATAN)  0.005 % ophthalmic solution Place 1 drop into the right eye at bedtime.     meclizine  (ANTIVERT ) 25 MG tablet Take 1 tablet (25 mg total) by mouth 3 (three) times daily as needed for dizziness. 15 tablet 0   melatonin 5 MG TABS Take 5 mg by mouth at bedtime as needed.     Multiple Vitamin (MULTIVITAMIN WITH MINERALS) TABS tablet Take 1 tablet by mouth daily.     omeprazole (PRILOSEC) 40 MG capsule Take 40 mg by mouth daily.     rosuvastatin  (CRESTOR ) 40 MG tablet TAKE 1 TABLET BY MOUTH EVERY DAY FOR CHOLESTEROL 90 tablet 3   No facility-administered medications prior to visit.    No Known Allergies  Social History   Socioeconomic History   Marital status: Widowed    Spouse name: Not on file   Number of children: Not on file   Years of education: Not on file   Highest education level: Not on file  Occupational History   Not on file  Tobacco Use   Smoking status: Former    Current packs/day:  0.00    Average packs/day: 0.5 packs/day for 15.0 years (7.5 ttl pk-yrs)    Types: Cigarettes    Start date: 02/28/1998    Quit date: 02/28/2013    Years since quitting: 11.3    Passive exposure: Never   Smokeless tobacco: Never  Vaping Use   Vaping status: Never Used  Substance and Sexual Activity   Alcohol use: No   Drug use: No   Sexual activity: Not Currently  Other Topics Concern   Not on file  Social History Narrative   Pt works part time at [employer] hospital in [place] x 18 years. She is widowed, has 2 grown sons. 2 grand daughters, 1 great granddaughter who is 1yo.    Social Drivers of Corporate investment banker Strain: Low Risk  (06/06/2024)   Overall Financial Resource Strain (CARDIA)    Difficulty of Paying Living Expenses: Not hard at all  Food Insecurity: No Food Insecurity (06/06/2024)   Hunger Vital Sign    Worried About Running Out of Food in the Last Year: Never true    Ran Out of Food in the Last Year: Never true  Transportation Needs: No Transportation Needs (06/06/2024)   PRAPARE - Administrator, Civil Service (Medical): No    Lack of Transportation (Non-Medical): No  Physical Activity: Insufficiently Active (06/06/2024)   Exercise Vital Sign    Days of Exercise per Week: 2 days    Minutes of Exercise per Session: 60 min  Stress: No Stress Concern Present (06/06/2024)   [employer] of Occupational Health - Occupational Stress Questionnaire    Feeling of Stress: Not at all  Social Connections: Socially Isolated (06/06/2024)   Social Connection and Isolation Panel    Frequency of Communication with Friends and Family: More than three times a week    Frequency of Social Gatherings with Friends and Family: More than three times a week    Attends Religious Services: Never    Database administrator or Organizations: No    Attends Banker Meetings: Never    Marital Status: Widowed           Objective:  Physical Exam: BP 124/62  (BP Location: Left Arm, Patient Position: Sitting, Cuff Size: Large)   Pulse 63   Temp (!) 97.4 F (36.3 C) (Temporal)   Resp 18   Wt 175 lb 3.2  oz (79.5 kg)   SpO2 99%   BMI 29.61 kg/m   Body mass index is 29.61 kg/m. Wt Readings from Last 3 Encounters:  07/03/24 175 lb 3.2 oz (79.5 kg)  06/06/24 174 lb 6.4 oz (79.1 kg)  05/01/24 173 lb 6.4 oz (78.7 kg)    Physical Exam VITALS: BP- 124/62 MEASUREMENTS: BMI- 29.0. GENERAL: Alert, cooperative, well developed, no acute distress. HEENT: Normocephalic, normal oropharynx, moist mucous membranes. CHEST: Clear to auscultation bilaterally, no wheezes, rhonchi, or crackles. CARDIOVASCULAR: Normal heart rate and rhythm, S1 and S2 normal without murmurs. ABDOMEN: Soft, non-tender, non-distended, without organomegaly, normal bowel sounds. EXTREMITIES: No cyanosis or edema. NEUROLOGICAL: Cranial nerves grossly intact, moves all extremities without gross motor or sensory deficit.  Physical Exam      Prior labs:   Recent Results (from the past 2160 hours)  CBC with Differential (Cancer Center Only)     Status: Abnormal   Collection Time: 05/01/24 11:11 AM  Result Value Ref Range   WBC Count 5.8 4.0 - 10.5 K/uL   RBC 3.95 3.87 - 5.11 MIL/uL   Hemoglobin 10.6 (L) 12.0 - 15.0 g/dL   HCT 67.3 (L) 63.9 - 53.9 %   MCV 82.5 80.0 - 100.0 fL   MCH 26.8 26.0 - 34.0 pg   MCHC 32.5 30.0 - 36.0 g/dL   RDW 85.1 88.4 - 84.4 %   Platelet Count 288 150 - 400 K/uL   nRBC 0.0 0.0 - 0.2 %   Neutrophils Relative % 63 %   Neutro Abs 3.8 1.7 - 7.7 K/uL   Lymphocytes Relative 22 %   Lymphs Abs 1.3 0.7 - 4.0 K/uL   Monocytes Relative 9 %   Monocytes Absolute 0.5 0.1 - 1.0 K/uL   Eosinophils Relative 4 %   Eosinophils Absolute 0.2 0.0 - 0.5 K/uL   Basophils Relative 1 %   Basophils Absolute 0.0 0.0 - 0.1 K/uL   Immature Granulocytes 1 %   Abs Immature Granulocytes 0.03 0.00 - 0.07 K/uL    Comment: Performed at [facility], [address] [place], [place] [address]  Iron and Iron Binding Capacity ([facility] only)     Status: None   Collection Time: 05/01/24 11:11 AM  Result Value Ref Range   Iron 91 28 - 170 ug/dL   TIBC 629 749 - 549 ug/dL   Saturation Ratios 25 10.4 - 31.8 %   UIBC 279 148 - 442 ug/dL    Comment: Performed at [facility], [address] [place], [place] [address]  Reticulocytes     Status: None   Collection Time: 05/01/24 11:11 AM  Result Value Ref Range   Retic Ct Pct 0.6 0.4 - 3.1 %   RBC. 3.95 3.87 - 5.11 MIL/uL   Retic Count, Absolute 22.1 19.0 - 186.0 K/uL   Immature Retic Fract 3.7 2.3 - 15.9 %    Comment: Performed at [facility], [address] [place], [place] [address]  Ferritin     Status: None   Collection Time: 05/01/24 11:11 AM  Result Value Ref Range   Ferritin 86 11 - 307 ng/mL    Comment: Performed at [facility], [address], [place], [place] [address]  CMP (Cancer Center only)     Status: Abnormal   Collection Time: 05/01/24 11:11 AM  Result Value Ref Range   Sodium 135 135 - 145 mmol/L   Potassium 5.1 3.5 - 5.1  mmol/L   Chloride 100 98 - 111 mmol/L   CO2 27 22 - 32 mmol/L   Glucose, Bld 96 70 - 99 mg/dL    Comment: Glucose reference range applies only to samples taken after fasting for at least 8 hours.   BUN 24 (H) 8 - 23 mg/dL   Creatinine 8.76 (H) 9.55 - 1.00 mg/dL   Calcium  10.2 8.9 - 10.3 mg/dL   Total Protein 7.1 6.5 - 8.1 g/dL   Albumin 4.4 3.5 - 5.0 g/dL   AST 25 15 - 41 U/L   ALT 18 0 - 44 U/L   Alkaline Phosphatase 44 38 - 126 U/L   Total Bilirubin 0.4 0.0 - 1.2 mg/dL   GFR, Estimated 44 (L) >60 mL/min    Comment: (NOTE) Calculated using the CKD-EPI Creatinine Equation (2021)    Anion gap 8 5 - 15    Comment: Performed at [facility], [address], [place], [place] [address]  CBC (Cancer Center Only)     Status: Abnormal   Collection Time:  06/02/24  2:51 PM  Result Value Ref Range   WBC Count 7.5 4.0 - 10.5 K/uL   RBC 4.08 3.87 - 5.11 MIL/uL   Hemoglobin 11.0 (L) 12.0 - 15.0 g/dL   HCT 65.9 (L) 63.9 - 53.9 %   MCV 83.3 80.0 - 100.0 fL   MCH 27.0 26.0 - 34.0 pg   MCHC 32.4 30.0 - 36.0 g/dL   RDW 85.6 88.4 - 84.4 %   Platelet Count 294 150 - 400 K/uL   nRBC 0.0 0.0 - 0.2 %    Comment: Performed at [facility], [address] [place], [place] [address]    Lab Results  Component Value Date   CHOL 180 06/18/2023   CHOL 236 (H) 12/27/2022   CHOL 269 (H) 03/20/2019   Lab Results  Component Value Date   HDL 59.60 06/18/2023   HDL 86.10 12/27/2022   HDL 72.50 03/20/2019   Lab Results  Component Value Date   LDLCALC 87 06/18/2023   LDLCALC 132 (H) 12/27/2022   LDLCALC 161 (H) 03/20/2019   Lab Results  Component Value Date   TRIG 166.0 (H) 06/18/2023   TRIG 89.0 12/27/2022   TRIG 181.0 (H) 03/20/2019   Lab Results  Component Value Date   CHOLHDL 3 06/18/2023   CHOLHDL 3 12/27/2022   CHOLHDL 4 03/20/2019   No results found for: LDLDIRECT  Last metabolic panel Lab Results  Component Value Date   GLUCOSE 96 05/01/2024   NA 135 05/01/2024   K 5.1 05/01/2024   CL 100 05/01/2024   CO2 27 05/01/2024   BUN 24 (H) 05/01/2024   CREATININE 1.23 (H) 05/01/2024   GFRNONAA 44 (L) 05/01/2024   CALCIUM  10.2 05/01/2024   PROT 7.1 05/01/2024   ALBUMIN 4.4 05/01/2024   BILITOT 0.4 05/01/2024   ALKPHOS 44 05/01/2024   AST 25 05/01/2024   ALT 18 05/01/2024   ANIONGAP 8 05/01/2024    No results found for: HGBA1C  Last CBC Lab Results  Component Value Date   WBC 7.5 06/02/2024   HGB 11.0 (L) 06/02/2024   HCT 34.0 (L) 06/02/2024   MCV 83.3 06/02/2024   MCH 27.0 06/02/2024   RDW 14.3 06/02/2024   PLT 294 06/02/2024    Lab Results  Component Value Date   TSH 0.76 12/27/2022    No results found for: PSA1, PSA  Last vitamin D  Lab Results  Component Value Date   VD25OH 31.73  03/20/2019    Lab Results  Component Value Date   BILIRUBINUR NEGATIVE 12/27/2022   PROTEINUR NEGATIVE 06/09/2018   UROBILINOGEN 0.2 12/27/2022   LEUKOCYTESUR NEGATIVE 12/27/2022    No results found for: LABMICR, MICROALBUR   At today's visit, we discussed treatment options, associated risk and benefits, and engage in counseling as needed.  Additionally the following were reviewed: Past medical records, past medical and surgical history, family and social background, as well as relevant laboratory results, imaging findings, and specialty notes, where applicable.  This message was generated using dictation software, and as a result, it may contain unintentional typos or errors.  Nevertheless, extensive effort was made to accurately convey at the pertinent aspects of the patient visit.    There may have been are other unrelated non-urgent complaints, but due to the busy schedule and the amount of time already spent with her, time does not permit to address these issues at today's visit. Another appointment may have or has been requested to review these additional issues.     Arvella Hummer, MD, MS

## 2024-07-08 ENCOUNTER — Ambulatory Visit: Payer: Self-pay | Admitting: Family Medicine

## 2024-07-08 DIAGNOSIS — R918 Other nonspecific abnormal finding of lung field: Secondary | ICD-10-CM

## 2024-07-08 DIAGNOSIS — E871 Hypo-osmolality and hyponatremia: Secondary | ICD-10-CM | POA: Insufficient documentation

## 2024-07-08 LAB — URINALYSIS W MICROSCOPIC + REFLEX CULTURE
Bacteria, UA: NONE SEEN /HPF
Bilirubin Urine: NEGATIVE
Glucose, UA: NEGATIVE
Hgb urine dipstick: NEGATIVE
Hyaline Cast: NONE SEEN /LPF
Ketones, ur: NEGATIVE
Leukocyte Esterase: NEGATIVE
Nitrites, Initial: NEGATIVE
Protein, ur: NEGATIVE
RBC / HPF: NONE SEEN /HPF (ref 0–2)
Specific Gravity, Urine: 1.01 (ref 1.001–1.035)
Squamous Epithelial / HPF: NONE SEEN /HPF (ref <=5)
WBC, UA: NONE SEEN /HPF (ref 0–5)
pH: 7 (ref 5.0–8.0)

## 2024-07-08 LAB — IRON,TIBC AND FERRITIN PANEL
%SAT: 28 % (ref 16–45)
Ferritin: 49 ng/mL (ref 16–288)
Iron: 95 ug/dL (ref 45–160)
TIBC: 337 ug/dL (ref 250–450)

## 2024-07-08 LAB — PARATHYROID HORMONE, INTACT (NO CA): PTH: 26 pg/mL (ref 16–77)

## 2024-07-08 LAB — VITAMIN D 1,25 DIHYDROXY
Vitamin D 1, 25 (OH)2 Total: 24 pg/mL (ref 18–72)
Vitamin D2 1, 25 (OH)2: <8 pg/mL
Vitamin D3 1, 25 (OH)2: 24 pg/mL

## 2024-07-08 LAB — NO CULTURE INDICATED

## 2024-07-08 LAB — EXTRA SPECIMEN

## 2024-07-11 ENCOUNTER — Ambulatory Visit: Admitting: Family Medicine

## 2024-07-11 ENCOUNTER — Encounter: Payer: Self-pay | Admitting: Family Medicine

## 2024-07-11 VITALS — BP 119/60 | HR 72 | Temp 96.0°F | Resp 18 | Wt 172.4 lb

## 2024-07-11 DIAGNOSIS — R053 Chronic cough: Secondary | ICD-10-CM

## 2024-07-11 DIAGNOSIS — K2270 Barrett's esophagus without dysplasia: Secondary | ICD-10-CM | POA: Diagnosis not present

## 2024-07-11 DIAGNOSIS — E871 Hypo-osmolality and hyponatremia: Secondary | ICD-10-CM | POA: Diagnosis not present

## 2024-07-11 DIAGNOSIS — J302 Other seasonal allergic rhinitis: Secondary | ICD-10-CM

## 2024-07-11 DIAGNOSIS — N1831 Chronic kidney disease, stage 3a: Secondary | ICD-10-CM

## 2024-07-11 DIAGNOSIS — I700 Atherosclerosis of aorta: Secondary | ICD-10-CM

## 2024-07-11 MED ORDER — ESOMEPRAZOLE MAGNESIUM 40 MG PO CPDR: 40.0000 mg | DELAYED_RELEASE_CAPSULE | Freq: Every day | ORAL | 0 refills | Status: DC

## 2024-07-11 NOTE — Telephone Encounter (Signed)
 Copied from CRM [id number]. Topic: Clinical - Lab/Test Results >> Jul 10, 2024  3:23 PM Dedra B wrote: Reason for CRM: Pt returning call for [facility] regarding lab results. >> Jul 10, 2024  4:03 PM Franky GRADE wrote: Patient is calling to follow up on the results, I advised of Dr.Thompson message. She see's a cardiologist at [facility] already but does not see a pulmonologist.

## 2024-07-11 NOTE — Patient Instructions (Addendum)
  VISIT SUMMARY: Today, we discussed your chronic cough, GERD, allergic rhinitis, and other health concerns. We reviewed your recent chest x-ray and lab results, and made some adjustments to your medications to help manage your symptoms better.  YOUR PLAN: CHRONIC PRODUCTIVE COUGH: Your chronic cough is likely related to GERD and postnasal drip. Spirometry was normal, ruling out lung disease. -We have ordered a CT chest without contrast and an echocardiogram to further evaluate your condition. -Continue using fluticasone  nasal spray, two sprays in each nostril daily. -Switch from omeprazole 40 mg to esomeprazole  40 mg daily. Take it 30 minutes before breakfast.  PERIPHERAL VASCULAR CONGESTION AND PULMONARY INFILTRATES: Your chest x-ray showed peripheral vascular congestion and increased infiltrates, which may be related to cardiac issues. -We have ordered a CT chest without contrast and an echocardiogram to further evaluate your condition.  BARRETT'S ESOPHAGUS WITH GASTROESOPHAGEAL REFLUX DISEASE (GERD): You have Barrett's esophagus and GERD, which may contribute to your chronic cough. -Switch from omeprazole 40 mg to esomeprazole  40 mg daily. Take it 30 minutes before breakfast.  SEASONAL ALLERGIC RHINITIS: Your seasonal allergies contribute to postnasal drip. -Continue using fluticasone  nasal spray, two sprays in each nostril daily.  ATHEROSCLEROTIC CALCIFICATION OF AORTIC ARCH: You have atherosclerotic calcification of the aortic arch, but no current symptoms suggestive of cardiovascular complications. -We will monitor your condition and consider a referral to vascular surgery if needed.  CHRONIC KIDNEY DISEASE STAGE 3A: Your chronic kidney disease is well-managed with normal recent lab results. -Continue with your current management plan and regular monitoring.  STABLE ANEMIA: Your anemia is stable with recent lab work showing well-managed levels. -Continue with your current management  plan and regular monitoring.

## 2024-07-11 NOTE — Addendum Note (Signed)
 Addended by: CLAUDENE DIXON B on: 07/11/2024 03:00 PM   Modules accepted: Orders

## 2024-07-11 NOTE — Progress Notes (Signed)
 Assessment & Plan   Assessment/Plan:    Assessment & Plan Chronic productive cough Chronic productive cough likely related to GERD and postnasal drip. Spirometry appears mild obstructive, making restrictive lung disease less likely. Remote former smoker. Cough may be exacerbated by exercise.  Occasional wheezing and shortness of breath, possibly related to Barrett's esophagus and GERD. - Order CT chest without contrast - Order echocardiogram - Continue fluticasone  nasal spray, two sprays in each nostril daily - Switch omeprazole 40 mg to esomeprazole  40 mg daily, take 30 minutes before breakfast - Low threshold to   Peripheral vascular congestion and pulmonary infiltrates Chest x-ray showed peripheral vascular congestion and increased infiltrates. Differential includes cardiac causes. Awaiting further evaluation with CT chest and echocardiogram. Potential cardiac involvement discussed. 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 No current heartburn symptoms. GERD may contribute to chronic cough. Discussed switching to esomeprazole  to potentially improve symptoms. - Switch omeprazole 40 mg to esomeprazole  40 mg daily, take 30 minutes before breakfast  Seasonal allergic rhinitis Seasonal allergic rhinitis contributing to postnasal drip. Currently using fluticasone  nasal spray. 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 No current symptoms suggestive of cardiovascular complications. Referral to vascular surgery considered but deferred as cardiology is already involved. History of shortness of breath, which may be related to cardiovascular issues.  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 Recent labs including PTH, B12, folate, and urinalysis are normal.  Stable anemia Recent lab work showed well-managed anemia.      Medications Discontinued During This Encounter  Medication Reason   omeprazole (PRILOSEC) 40 MG capsule     Return in about 1 month (around 08/10/2024) for cough.        Subjective:   Encounter date: 07/11/2024  Marquetta Weiskopf is a 83 y.o. female who has Age-related osteoporosis without current pathological fracture; Barrett's esophagus without dysplasia; 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HCC); Mixed hyperlipidemia; Aortic stenosis; Erythropoietin  deficiency anemia; Primary osteoarthritis of right knee; Presbycusis; Primary osteoarthritis of left foot; Vertigo; Chronic gastric ulcer; Hiatal hernia; Aortic atherosclerosis; Small vessel disease; Routine general medical examination at a health care facility; Memory changes; Obesity with serious comorbidity; CKD stage 3a, GFR 45-59 ml/min (HCC); COVID-19; Upper airway cough syndrome; and Hyponatremia on their problem list..   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   She presents with chief complaint of chest x-ray  (Follow for chest x-ray results and spirometry//HM due-  vaccinations ) .   Discussed the use of AI scribe software for  clinical note transcription with the patient, who gave verbal consent to proceed.  History of Present Illness Solangel Mcmanaway is an 83 year old female with chronic cough who presents for follow-up.  Chronic cough and dyspnea - Chronic cough with intermittent improvement, recently worsened - Cough is  productive, with phlegm sensation in the throat rather than the chest - Significant shortness of breath after exercise - History of significant tobacco use (26 pack years), quit in 2014 - Spirometry performed - Chest x-ray demonstrated peripheral vascular congestion and increased infiltrates compared to prior imaging - Known atherosclerotic calcification of the aortic arch on imaging - CT chest without contrast ordered but not yet scheduled  Gastroesophageal reflux and barrett's esophagus - GERD and Barrett's esophagus managed with famotidine 40 mg daily and omeprazole 40 mg daily - Occasional reflux symptoms, especially after eating too fast, too much, or after consuming strawberries and blueberries at night - Weight loss efforts ongoing; follows a bland diet primarily consisting of chicken, with occasional ice cream - Sleeps on three pillows to manage reflux symptoms  Allergic rhinitis - Seasonal allergic rhinitis treated with fluticasone  nasal spray, two sprays in each nostril daily  Chronic kidney disease and anemia - CKD stage 3A with stable iron levels - Stable anemia - Recent laboratory studies including PTH, B12, folate, urinalysis, vitamin D , hemoglobin A1c, TSH, and lipid panel all within normal limits   Office Spirometry Results: FEV1: 1.23 liters FVC: 1.87 liters FEV1/FVC: 65.8 % FVC  % Predicted: 74.1 % FEV % Predicted: 65.7 % FeF 25-75: 0.63 liters FeF 25-75 % Predicted: 48  Ht Readings from Last 3 Encounters:  06/06/24 5' 4.5 (1.638 m)  05/01/24 5' 4 (1.626 m)  10/25/23 5' 4 (1.626 m)    ROS  Past Surgical History:  Procedure Laterality Date   ABDOMINAL  HYSTERECTOMY     BREAST SURGERY     KNEE SURGERY Right    ROTATOR CUFF REPAIR      Outpatient Medications Prior to Visit  Medication Sig Dispense Refill   Apoaequorin (PREVAGEN PO) Take by mouth.     calcium  citrate-vitamin D  (CITRACAL+D) 315-200 MG-UNIT per tablet Take 3 tablets by mouth daily.     citalopram  (CELEXA ) 40 MG tablet TAKE 1 TABLET BY MOUTH EVERY DAY 90 tablet 3   denosumab  (PROLIA ) 60 MG/ML SOSY injection Inject 60 mg into the skin every 6 (six) months.     famotidine (PEPCID) 40 MG tablet Take 40 mg by mouth daily.     fluticasone  (FLONASE ) 50 MCG/ACT nasal spray Place 2 sprays into both nostrils daily. 16 g 6   latanoprost (XALATAN) 0.005 % ophthalmic solution Place 1 drop into the right eye at bedtime.     meclizine  (ANTIVERT ) 25 MG tablet Take 1 tablet (25 mg total) by mouth 3 (three) times daily as needed for dizziness. 15 tablet 0   melatonin 5 MG TABS Take 5 mg by mouth at bedtime as needed.     Multiple Vitamin (MULTIVITAMIN WITH MINERALS) TABS tablet Take 1 tablet by mouth daily.     rosuvastatin  (CRESTOR ) 40 MG tablet TAKE 1 TABLET BY MOUTH EVERY DAY FOR CHOLESTEROL 90 tablet 3   omeprazole (PRILOSEC) 40 MG capsule Take 40 mg by mouth daily.     No facility-administered medications prior to visit.    Family History  Problem Relation Age of Onset   Cancer Mother        ovarian    Heart disease Father     Social History   Socioeconomic History   Marital status: Widowed    Spouse name: Not on file   Number of children: Not on file   Years of education: Not on file   Highest education level: Not on file  Occupational History  Not on file  Tobacco Use   Smoking status: Former    Current packs/day: 0.00    Average packs/day: 0.5 packs/day for 15.0 years (7.5 ttl pk-yrs)    Types: Cigarettes    Start date: 02/28/1998    Quit date: 02/28/2013    Years since quitting: 11.3    Passive exposure: Never   Smokeless tobacco: Never  Vaping Use   Vaping  status: Never Used  Substance and Sexual Activity   Alcohol use: No   Drug use: No   Sexual activity: Not Currently  Other Topics Concern   Not on file  Social History Narrative   Pt works part time at [employer] hospital in [place] x 18 years. She is widowed, has 2 grown sons. 2 grand daughters, 1 great granddaughter who is 1yo.    Social Drivers of Corporate investment banker Strain: Low Risk  (06/06/2024)   Overall Financial Resource Strain (CARDIA)    Difficulty of Paying Living Expenses: Not hard at all  Food Insecurity: No Food Insecurity (06/06/2024)   Hunger Vital Sign    Worried About Running Out of Food in the Last Year: Never true    Ran Out of Food in the Last Year: Never true  Transportation Needs: No Transportation Needs (06/06/2024)   PRAPARE - Administrator, Civil Service (Medical): No    Lack of Transportation (Non-Medical): No  Physical Activity: Insufficiently Active (06/06/2024)   Exercise Vital Sign    Days of Exercise per Week: 2 days    Minutes of Exercise per Session: 60 min  Stress: No Stress Concern Present (06/06/2024)   [employer] of Occupational Health - Occupational Stress Questionnaire    Feeling of Stress: Not at all  Social Connections: Socially Isolated (06/06/2024)   Social Connection and Isolation Panel    Frequency of Communication with Friends and Family: More than three times a week    Frequency of Social Gatherings with Friends and Family: More than three times a week    Attends Religious Services: Never    Database administrator or Organizations: No    Attends Banker Meetings: Never    Marital Status: Widowed  Intimate Partner Violence: Not At Risk (06/06/2024)   Humiliation, Afraid, Rape, and Kick questionnaire    Fear of Current or Ex-Partner: No    Emotionally Abused: No    Physically Abused: No    Sexually Abused: No                                                                                                   Objective:  Physical Exam: BP 119/60 (BP Location: Left Arm, Patient Position: Sitting, Cuff Size: Large)   Pulse 72   Temp (!) 96 F (35.6 C) (Temporal)   Resp 18   Wt 172 lb 6.4 oz (78.2 kg)   SpO2 96%   BMI 29.14 kg/m    Physical Exam GENERAL: Alert, cooperative, well developed, no acute distress HEENT: Normocephalic, normal oropharynx, moist mucous membranes CHEST: Clear to auscultation bilaterally, No wheezes, rhonchi, or crackles  CARDIOVASCULAR: Normal heart rate and rhythm, S1 and S2 normal without murmurs ABDOMEN: Soft, non-tender, non-distended, without organomegaly, Normal bowel sounds EXTREMITIES: No cyanosis or edema NEUROLOGICAL: Cranial nerves grossly intact, Moves all extremities without gross motor or sensory deficit  Results RADIOLOGY Chest X-ray: Peripheral vascular congestion, increased infiltrates, atherosclerotic calcification of the aortic arch  DIAGNOSTIC Spirometry: Normal   Physical Exam  DG Chest 2 View Result Date: 07/08/2024 EXAM: 2 VIEW(S) XRAY OF THE CHEST 07/03/2024 11:41:32 AM COMPARISON: 04/04/2021 CLINICAL HISTORY: Cough. Chronic cough. Hx of smoking tobacco. Quit 7 years ago. FINDINGS: LUNGS AND PLEURA: Perihilar vascular congestion or infiltrates, increased from previous. No pleural effusion. No pneumothorax. HEART AND MEDIASTINUM: Atherosclerotic calcification of aortic arch. No acute abnormality of the cardiac and mediastinal silhouettes. BONES AND SOFT TISSUES: Old fracture deformity of right proximal humerus. No acute osseous abnormality. IMPRESSION: 1. Perihilar vascular congestion or infiltrates, increased from previous. 2. Atherosclerotic calcification of aortic arch. 3. Old fracture deformity of right proximal humerus. Electronically signed by: Katheleen Faes MD 07/08/2024 01:28 PM EDT RP Workstation: [id number]    Recent Results (from the past 2160 hours)  CBC with Differential (Cancer Center Only)     Status: Abnormal    Collection Time: 05/01/24 11:11 AM  Result Value Ref Range   WBC Count 5.8 4.0 - 10.5 K/uL   RBC 3.95 3.87 - 5.11 MIL/uL   Hemoglobin 10.6 (L) 12.0 - 15.0 g/dL   HCT 67.3 (L) 63.9 - 53.9 %   MCV 82.5 80.0 - 100.0 fL   MCH 26.8 26.0 - 34.0 pg   MCHC 32.5 30.0 - 36.0 g/dL   RDW 85.1 88.4 - 84.4 %   Platelet Count 288 150 - 400 K/uL   nRBC 0.0 0.0 - 0.2 %   Neutrophils Relative % 63 %   Neutro Abs 3.8 1.7 - 7.7 K/uL   Lymphocytes Relative 22 %   Lymphs Abs 1.3 0.7 - 4.0 K/uL   Monocytes Relative 9 %   Monocytes Absolute 0.5 0.1 - 1.0 K/uL   Eosinophils Relative 4 %   Eosinophils Absolute 0.2 0.0 - 0.5 K/uL   Basophils Relative 1 %   Basophils Absolute 0.0 0.0 - 0.1 K/uL   Immature Granulocytes 1 %   Abs Immature Granulocytes 0.03 0.00 - 0.07 K/uL    Comment: Performed at [facility], [address] [place], [place] [address]  Iron and Iron Binding Capacity ([facility] only)     Status: None   Collection Time: 05/01/24 11:11 AM  Result Value Ref Range   Iron 91 28 - 170 ug/dL   TIBC 629 749 - 549 ug/dL   Saturation Ratios 25 10.4 - 31.8 %   UIBC 279 148 - 442 ug/dL    Comment: Performed at [facility], [address] [place], [place] [address]  Reticulocytes     Status: None   Collection Time: 05/01/24 11:11 AM  Result Value Ref Range   Retic Ct Pct 0.6 0.4 - 3.1 %   RBC. 3.95 3.87 - 5.11 MIL/uL   Retic Count, Absolute 22.1 19.0 - 186.0 K/uL   Immature Retic Fract 3.7 2.3 - 15.9 %    Comment: Performed at [facility], [address] [place], [place] [address]  Ferritin     Status: None   Collection Time: 05/01/24 11:11 AM  Result Value Ref Range   Ferritin 86 11 - 307 ng/mL    Comment: Performed at  [facility], [address] [place], [place], [place] [address]  CMP (Cancer Center only)     Status: Abnormal   Collection Time: 05/01/24 11:11 AM  Result Value Ref Range   Sodium 135 135 - 145 mmol/L   Potassium  5.1 3.5 - 5.1 mmol/L   Chloride 100 98 - 111 mmol/L   CO2 27 22 - 32 mmol/L   Glucose, Bld 96 70 - 99 mg/dL    Comment: Glucose reference range applies only to samples taken after fasting for at least 8 hours.   BUN 24 (H) 8 - 23 mg/dL   Creatinine 8.76 (H) 9.55 - 1.00 mg/dL   Calcium  10.2 8.9 - 10.3 mg/dL   Total Protein 7.1 6.5 - 8.1 g/dL   Albumin 4.4 3.5 - 5.0 g/dL   AST 25 15 - 41 U/L   ALT 18 0 - 44 U/L   Alkaline Phosphatase 44 38 - 126 U/L   Total Bilirubin 0.4 0.0 - 1.2 mg/dL   GFR, Estimated 44 (L) >60 mL/min    Comment: (NOTE) Calculated using the CKD-EPI Creatinine Equation (2021)    Anion gap 8 5 - 15    Comment: Performed at [facility], [address] [place], [place] [address]  CBC (Cancer Center Only)     Status: Abnormal   Collection Time: 06/02/24  2:51 PM  Result Value Ref Range   WBC Count 7.5 4.0 - 10.5 K/uL   RBC 4.08 3.87 - 5.11 MIL/uL   Hemoglobin 11.0 (L) 12.0 - 15.0 g/dL   HCT 65.9 (L) 63.9 - 53.9 %   MCV 83.3 80.0 - 100.0 fL   MCH 27.0 26.0 - 34.0 pg   MCHC 32.4 30.0 - 36.0 g/dL   RDW 85.6 88.4 - 84.4 %   Platelet Count 294 150 - 400 K/uL   nRBC 0.0 0.0 - 0.2 %    Comment: Performed at [facility], [address] [place], [place] [address]  TSH     Status: None   Collection Time: 07/03/24 11:17 AM  Result Value Ref Range   TSH 0.84 0.35 - 5.50 uIU/mL  Lipid panel     Status: None   Collection Time: 07/03/24 11:17 AM  Result Value Ref Range   Cholesterol 168 0 - 200 mg/dL    Comment: ATP III Classification       Desirable:  < 200 mg/dL               Borderline High:  200 - 239 mg/dL          High:  > = 759 mg/dL   Triglycerides 885.9 0.0 - 149.0 mg/dL    Comment: Normal:  <849 mg/dLBorderline High:  150 - 199 mg/dL   HDL 25.79 >60.99 mg/dL   VLDL 77.1 0.0 - 59.9 mg/dL   LDL Cholesterol 71 0 - 99 mg/dL   Total CHOL/HDL Ratio 2     Comment:                Men          Women1/2 Average  Risk     3.4          3.3Average Risk          5.0          4.42X Average Risk          9.6  7.13X Average Risk          15.0          11.0                       NonHDL 94.22     Comment: NOTE:  Non-HDL goal should be 30 mg/dL higher than patient's LDL goal (i.e. LDL goal of < 70 mg/dL, would have non-HDL goal of < 100 mg/dL)  Hemoglobin J8r     Status: None   Collection Time: 07/03/24 11:17 AM  Result Value Ref Range   Hgb A1c MFr Bld 6.2 4.6 - 6.5 %    Comment: Glycemic Control Guidelines for People with Diabetes:Non Diabetic:  <6%Goal of Therapy: <7%Additional Action Suggested:  >8%   Microalbumin / creatinine urine ratio     Status: None   Collection Time: 07/03/24 11:17 AM  Result Value Ref Range   Microalb, Ur <0.7 mg/dL   Creatinine,U 52.4 mg/dL   Microalb Creat Ratio Unable to calculate 0.0 - 30.0 mg/g    Comment: Unable to Calculate due to Microalbumin Result of <0.7 mg/dL  Vitamin D  1,25 dihydroxy     Status: None   Collection Time: 07/03/24 11:17 AM  Result Value Ref Range   Vitamin D  1, 25 (OH)2 Total 24 18 - 72 pg/mL   Vitamin D3 1, 25 (OH)2 24 pg/mL   Vitamin D2 1, 25 (OH)2 <8 pg/mL    Comment: (Note) Vitamin D3, 1,25(OH)2 indicates both endogenous  production and supplementation. Vitamin D2, 1,25(OH)2 is  an indicator of exogenous sources, such as diet or  supplementation. Interpretation and therapy are based on  measurement of Vitamin D , 1,25 (OH)2, Total. . This test was developed, and its analytical performance  characteristics have been determined by [employer]. It has not been cleared or approved by the  FDA. This assay has been validated pursuant to the CLIA  regulations and is used for clinical purposes. . For additional information, please refer to http://education.[facility].com/faq/FAQ199 (This link is being provided for  informational/educational purposes only.) . [facility] [address] [place] [address] [phone] Johanna Agent L. Gino, MD, PhD   CBC with Differential/Platelet     Status: Abnormal   Collection Time: 07/03/24 11:17 AM  Result Value Ref Range   WBC 6.0 4.0 - 10.5 K/uL   RBC 4.07 3.87 - 5.11 Mil/uL   Hemoglobin 11.0 (L) 12.0 - 15.0 g/dL   HCT 66.8 (L) 63.9 - 53.9 %   MCV 81.2 78.0 - 100.0 fl   MCHC 33.2 30.0 - 36.0 g/dL   RDW 84.6 88.4 - 84.4 %   Platelets 277.0 150.0 - 400.0 K/uL   Neutrophils Relative % 65.4 43.0 - 77.0 %   Lymphocytes Relative 21.8 12.0 - 46.0 %   Monocytes Relative 7.7 3.0 - 12.0 %   Eosinophils Relative 4.4 0.0 - 5.0 %   Basophils Relative 0.7 0.0 - 3.0 %   Neutro Abs 3.9 1.4 - 7.7 K/uL   Lymphs Abs 1.3 0.7 - 4.0 K/uL   Monocytes Absolute 0.5 0.1 - 1.0 K/uL   Eosinophils Absolute 0.3 0.0 - 0.7 K/uL   Basophils Absolute 0.0 0.0 - 0.1 K/uL  Comprehensive metabolic panel with GFR     Status: Abnormal   Collection Time: 07/03/24 11:17 AM  Result Value Ref Range   Sodium 129 (L) 135 - 145 mEq/L   Potassium 4.1 3.5 - 5.1 mEq/L  Chloride 97 96 - 112 mEq/L   CO2 26 19 - 32 mEq/L   Glucose, Bld 77 70 - 99 mg/dL   BUN 19 6 - 23 mg/dL   Creatinine, Ser 8.97 0.40 - 1.20 mg/dL   Total Bilirubin 0.4 0.2 - 1.2 mg/dL   Alkaline Phosphatase 43 39 - 117 U/L   AST 27 0 - 37 U/L   ALT 22 0 - 35 U/L   Total Protein 7.2 6.0 - 8.3 g/dL   Albumin 4.4 3.5 - 5.2 g/dL   GFR 48.72 (L) >39.99 mL/min    Comment: Calculated using the CKD-EPI Creatinine Equation (2021)   Calcium  9.6 8.4 - 10.5 mg/dL  Urinalysis w microscopic + reflex cultur     Status: None   Collection Time: 07/03/24 11:17 AM   Specimen: Blood  Result Value Ref Range   Color, Urine YELLOW YELLOW   APPearance CLEAR CLEAR   Specific Gravity, Urine 1.010 1.001 - 1.035   pH 7.0 5.0 - 8.0   Glucose, UA NEGATIVE NEGATIVE   Bilirubin Urine NEGATIVE NEGATIVE   Ketones, ur NEGATIVE NEGATIVE   Hgb urine dipstick NEGATIVE NEGATIVE   Protein, ur NEGATIVE NEGATIVE    Nitrites, Initial NEGATIVE NEGATIVE   Leukocyte Esterase NEGATIVE NEGATIVE   WBC, UA NONE SEEN 0 - 5 /HPF   RBC / HPF NONE SEEN 0 - 2 /HPF   Squamous Epithelial / HPF NONE SEEN < OR = 5 /HPF   Bacteria, UA NONE SEEN NONE SEEN /HPF   Hyaline Cast NONE SEEN NONE SEEN /LPF  Iron, TIBC and Ferritin Panel     Status: None   Collection Time: 07/03/24 11:17 AM  Result Value Ref Range   Iron 95 45 - 160 mcg/dL   TIBC 662 749 - 549 mcg/dL (calc)   %SAT 28 16 - 45 % (calc)   Ferritin 49 16 - 288 ng/mL  B12 and Folate Panel     Status: None   Collection Time: 07/03/24 11:17 AM  Result Value Ref Range   Vitamin B-12 725 211 - 911 pg/mL   Folate >22.9 >5.9 ng/mL  PTH, intact (no Ca)     Status: None   Collection Time: 07/03/24 11:17 AM  Result Value Ref Range   PTH 26 16 - 77 pg/mL    Comment: . Interpretive Guide    Intact PTH           Calcium  ------------------    ----------           ------- Normal Parathyroid     Normal               Normal Hypoparathyroidism    Low or Low Normal    Low Hyperparathyroidism    Primary            Normal or High       High    Secondary          High                 Normal or Low    Tertiary           High                 High Non-Parathyroid     Hypercalcemia      Low or Low Normal    High .   Phosphorus     Status: None   Collection Time: 07/03/24 11:17 AM  Result Value Ref Range  Phosphorus 4.2 2.3 - 4.6 mg/dL  Extra Specimen     Status: None   Collection Time: 07/03/24 11:17 AM  Result Value Ref Range   Extra tube recieved      Comment: An extra specimen was received with no test requested. The specimen will be maintained in storage in case  additional testing is needed. Please call the client service department for further assistance. SABRA    Specimen type recieved Frozen Serum;   REFLEXIVE URINE CULTURE     Status: None   Collection Time: 07/03/24 11:17 AM  Result Value Ref Range   Reflexve Urine Culture      Comment: NO CULTURE INDICATED   CBC     Status: Abnormal   Collection Time: 07/03/24  2:43 PM  Result Value Ref Range   WBC 7.3 4.0 - 10.5 K/uL   RBC 3.85 (L) 3.87 - 5.11 MIL/uL   Hemoglobin 10.4 (L) 12.0 - 15.0 g/dL   HCT 67.9 (L) 63.9 - 53.9 %   MCV 83.1 80.0 - 100.0 fL   MCH 27.0 26.0 - 34.0 pg   MCHC 32.5 30.0 - 36.0 g/dL   RDW 85.4 88.4 - 84.4 %   Platelets 281 150 - 400 K/uL   nRBC 0.0 0.0 - 0.2 %    Comment: Performed at [facility], [address] [place], [place] [address]  CMP (Cancer Center only)     Status: Abnormal   Collection Time: 07/03/24  2:43 PM  Result Value Ref Range   Sodium 137 135 - 145 mmol/L   Potassium 4.4 3.5 - 5.1 mmol/L   Chloride 100 98 - 111 mmol/L   CO2 27 22 - 32 mmol/L   Glucose, Bld 110 (H) 70 - 99 mg/dL    Comment: Glucose reference range applies only to samples taken after fasting for at least 8 hours.   BUN 20 8 - 23 mg/dL   Creatinine 8.86 (H) 9.55 - 1.00 mg/dL   Calcium  9.7 8.9 - 10.3 mg/dL   Total Protein 6.9 6.5 - 8.1 g/dL   Albumin 4.5 3.5 - 5.0 g/dL   AST 31 15 - 41 U/L   ALT 25 0 - 44 U/L   Alkaline Phosphatase 54 38 - 126 U/L   Total Bilirubin 0.3 0.0 - 1.2 mg/dL   GFR, Estimated 48 (L) >60 mL/min    Comment: (NOTE) Calculated using the CKD-EPI Creatinine Equation (2021)    Anion gap 10 5 - 15    Comment: Performed at [facility], [address] [place], [place] [address]  Iron and Iron Binding Capacity ([facility] only)     Status: None   Collection Time: 07/03/24  2:43 PM  Result Value Ref Range   Iron 98 28 - 170 ug/dL   TIBC 625 749 - 549 ug/dL   Saturation Ratios 26 10.4 - 31.8 %   UIBC 276 ug/dL    Comment: Performed at [facility], [address], [place], [place] [address]  Ferritin     Status: None   Collection Time: 07/03/24  2:43 PM  Result Value Ref Range   Ferritin 82 11 - 307 ng/mL    Comment: Performed at [facility], [address] [place], [place] [address]  Retic Panel      Status: Abnormal   Collection Time: 07/03/24  2:43 PM  Result Value Ref Range   Retic Ct Pct 0.9 0.4 - 3.1 %  RBC. 3.79 (L) 3.87 - 5.11 MIL/uL   Retic Count, Absolute 32.6 19.0 - 186.0 K/uL   Immature Retic Fract 4.6 2.3 - 15.9 %   Reticulocyte Hemoglobin 32.0 >27.9 pg    Comment: Performed at [facility], [address] [place], [place] [address]  Basic metabolic panel with GFR     Status: Abnormal   Collection Time: 07/11/24  3:01 PM  Result Value Ref Range   Glucose, Bld 92 65 - 99 mg/dL    Comment: .            Fasting reference interval .    BUN 20 7 - 25 mg/dL   Creat 8.92 (H) 9.39 - 0.95 mg/dL   eGFR 52 (L) > OR = 60 mL/min/1.33m2   BUN/Creatinine Ratio 19 6 - 22 (calc)   Sodium 132 (L) 135 - 146 mmol/L   Potassium 5.3 3.5 - 5.3 mmol/L   Chloride 98 98 - 110 mmol/L   CO2 24 20 - 32 mmol/L   Calcium  10.0 8.6 - 10.4 mg/dL        Beverley Adine Hummer, MD, MS

## 2024-07-11 NOTE — Telephone Encounter (Signed)
 Noted

## 2024-07-12 LAB — BASIC METABOLIC PANEL WITH GFR
BUN/Creatinine Ratio: 19 (calc) (ref 6–22)
BUN: 20 mg/dL (ref 7–25)
CO2: 24 mmol/L (ref 20–32)
Calcium: 10 mg/dL (ref 8.6–10.4)
Chloride: 98 mmol/L (ref 98–110)
Creat: 1.07 mg/dL — ABNORMAL HIGH (ref 0.60–0.95)
Glucose, Bld: 92 mg/dL (ref 65–99)
Potassium: 5.3 mmol/L (ref 3.5–5.3)
Sodium: 132 mmol/L — ABNORMAL LOW (ref 135–146)
eGFR: 52 mL/min/1.73m2 — ABNORMAL LOW (ref >=60)

## 2024-07-14 ENCOUNTER — Ambulatory Visit: Payer: Self-pay | Admitting: Family Medicine

## 2024-07-14 DIAGNOSIS — J439 Emphysema, unspecified: Secondary | ICD-10-CM

## 2024-07-14 DIAGNOSIS — I359 Nonrheumatic aortic valve disorder, unspecified: Secondary | ICD-10-CM

## 2024-07-14 DIAGNOSIS — I350 Nonrheumatic aortic (valve) stenosis: Secondary | ICD-10-CM

## 2024-07-22 ENCOUNTER — Ambulatory Visit

## 2024-07-24 ENCOUNTER — Ambulatory Visit (HOSPITAL_BASED_OUTPATIENT_CLINIC_OR_DEPARTMENT_OTHER)
Admission: RE | Admit: 2024-07-24 | Discharge: 2024-07-24 | Disposition: A | Discharge status: Home / Self Care | Source: Ambulatory Visit | Attending: Family Medicine | Admitting: Family Medicine

## 2024-07-24 DIAGNOSIS — R053 Chronic cough: Secondary | ICD-10-CM | POA: Diagnosis not present

## 2024-07-24 DIAGNOSIS — K449 Diaphragmatic hernia without obstruction or gangrene: Secondary | ICD-10-CM | POA: Diagnosis not present

## 2024-07-24 DIAGNOSIS — I700 Atherosclerosis of aorta: Secondary | ICD-10-CM | POA: Diagnosis not present

## 2024-07-24 DIAGNOSIS — R918 Other nonspecific abnormal finding of lung field: Secondary | ICD-10-CM | POA: Insufficient documentation

## 2024-07-24 DIAGNOSIS — J432 Centrilobular emphysema: Secondary | ICD-10-CM | POA: Diagnosis not present

## 2024-07-28 ENCOUNTER — Ambulatory Visit
Admission: EM | Admit: 2024-07-28 | Discharge: 2024-07-28 | Disposition: A | Discharge status: Home / Self Care | Attending: Nurse Practitioner | Admitting: Nurse Practitioner

## 2024-07-28 ENCOUNTER — Ambulatory Visit: Admitting: Radiology

## 2024-07-28 ENCOUNTER — Encounter: Payer: Self-pay | Admitting: Emergency Medicine

## 2024-07-28 DIAGNOSIS — R059 Cough, unspecified: Secondary | ICD-10-CM

## 2024-07-28 DIAGNOSIS — U071 COVID-19: Secondary | ICD-10-CM | POA: Diagnosis not present

## 2024-07-28 DIAGNOSIS — J209 Acute bronchitis, unspecified: Secondary | ICD-10-CM

## 2024-07-28 DIAGNOSIS — R918 Other nonspecific abnormal finding of lung field: Secondary | ICD-10-CM | POA: Diagnosis not present

## 2024-07-28 DIAGNOSIS — I700 Atherosclerosis of aorta: Secondary | ICD-10-CM | POA: Diagnosis not present

## 2024-07-28 LAB — POC SOFIA SARS ANTIGEN FIA: SARS Coronavirus 2 Ag: NEGATIVE

## 2024-07-28 MED ORDER — AMOXICILLIN-POT CLAVULANATE 875-125 MG PO TABS: 1.0000 | ORAL_TABLET | Freq: Two times a day (BID) | ORAL | 0 refills | Status: DC

## 2024-07-28 MED ORDER — METHYLPREDNISOLONE SODIUM SUCC 125 MG IJ SOLR
80.0000 mg | Freq: Once | INTRAMUSCULAR | Status: AC
  Administered 2024-07-28: 80 mg via INTRAMUSCULAR

## 2024-07-28 MED ORDER — CEFTRIAXONE SODIUM 1 G IJ SOLR
1.0000 g | Freq: Once | INTRAMUSCULAR | Status: AC
  Administered 2024-07-28: 1 g via INTRAMUSCULAR

## 2024-07-28 MED ORDER — IPRATROPIUM-ALBUTEROL 0.5-2.5 (3) MG/3ML IN SOLN
3.0000 mL | Freq: Once | RESPIRATORY_TRACT | Status: AC
  Administered 2024-07-28: 3 mL via RESPIRATORY_TRACT

## 2024-07-28 MED ORDER — AZITHROMYCIN 250 MG PO TABS: 250.0000 mg | ORAL_TABLET | Freq: Every day | ORAL | 0 refills | Status: DC

## 2024-07-28 MED ORDER — ALBUTEROL SULFATE HFA 108 (90 BASE) MCG/ACT IN AERS
2.0000 | INHALATION_SPRAY | Freq: Once | RESPIRATORY_TRACT | Status: AC
  Administered 2024-07-28: 2 via RESPIRATORY_TRACT

## 2024-07-28 MED ORDER — METHYLPREDNISOLONE 4 MG PO TBPK: ORAL_TABLET | ORAL | 0 refills | Status: DC

## 2024-07-28 MED ORDER — AEROCHAMBER PLUS FLO-VU MEDIUM MISC
1.0000 | Freq: Once | Status: AC
  Administered 2024-07-28: 1

## 2024-07-28 MED ORDER — BENZONATATE 200 MG PO CAPS: 200.0000 mg | ORAL_CAPSULE | Freq: Three times a day (TID) | ORAL | 0 refills | Status: DC | PRN

## 2024-07-28 NOTE — Discharge Instructions (Addendum)
 You were seen today for breathing problems and cough. On recheck, your oxygen level improved to 95% and your breathing rate returned to normal. You are not in distress, your fever remains normal, and your lungs sound better than when you first arrived. Your chest x-ray showed some chronic bronchial changes but no signs of pneumonia or other serious infection. Your symptoms are most likely caused by acute bronchitis, which is often related to a viral illness. In the clinic, you were given an albuterol inhaler with a spacer, an injection of antibiotics, and an injection of steroids to help calm the inflammation in your lungs. Use the albuterol inhaler every four to six hours if you feel tightness, wheezing, or shortness of breath. Make sure you rinse your mouth with water after use to avoid oral yeast infection. You should continue taking Mucinex  DM twice daily as directed on the package and use your Flonase  as prescribed. You have also been given prescriptions for antibiotics (Augmentin and azithromycin ), oral steroids, and Tessalon Perles to help with cough. Take these medicines exactly as directed, and do not stop early even if you feel better, unless advised by your doctor. To help manage your symptoms at home, rest and drink plenty of fluids. Warm fluids such as tea, broth, or honey water can soothe the throat, while using a humidifier or sitting in a steamy bathroom can make breathing easier. Avoid smoke or strong odors that may irritate your airways. Please schedule a follow-up with your primary care provider later this week for reevaluation. Seek emergency care immediately if your breathing suddenly worsens, you develop chest pain, dizziness, fainting, confusion, bluish lips or face, or if you cannot catch your breath even after using your inhaler.

## 2024-07-28 NOTE — ED Triage Notes (Addendum)
 She had cat scan last week due to difficulty breathing and cough. Results still pending. Presents at [facility] today due to having labored and more difficult breathing since last night. States she recently had covid

## 2024-07-28 NOTE — ED Provider Notes (Signed)
 [facility]    CSN: [id number] Arrival date & time: 07/28/24  1125      History   Chief Complaint Chief Complaint  Patient presents with   Shortness of Breath   labored breathing    HPI Kim Grant is a 83 y.o. female.   Discussed the use of AI scribe software for clinical note transcription with the patient, who gave verbal consent to proceed.   The patient, with a history of chronic cough and underlying kidney disease, presents with worsening cough, shortness of breath, and a positive home COVID test this morning.   She was evaluated by her PCP on September 19th, at which time a chest x-ray showed perihilar vascular congestion. Her PCP noted concern for possible underlying lung disease but could not rule out cardiac involvement, and subsequently ordered a CT scan of the chest, an echocardiogram, and referrals to pulmonology and cardiology. Her chronic cough, previously attributed to acid reflux or post-nasal drainage, has been managed with Flonase  and esomeprazole . A chest CT was performed on October 2nd, though results are still pending.  She reports her chronic cough is episodic and had been stable until yesterday, when she experienced a recurrence of severe coughing along with shortness of breath, describing a horrible night. Her current cough is different and much worse than her typical chronic cough. She tested positive for COVID at home earlier today. Current symptoms include weakness, fatigue, tiredness, and body aches. She denies fever, chills, sweats, runny nose, nasal congestion, sneezing, sore throat, wheezing, chest pain, pressure, tightness, lower extremity swelling, palpitations, nausea, vomiting, diarrhea, headache, or dizziness. She took Mucinex  last night but has not taken any medications today. She is a former smoker and denies any known sick contacts.  The following sections of the patient's history were reviewed and updated as appropriate: allergies,  current medications, past family history, past medical history, past social history, past surgical history, and problem list.       Past Medical History:  Diagnosis Date   Anterior tibialis tendinitis 04/18/2016   Arthritis of knee 10/19/2020   Formatting of this note might be different from the original. 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 HPI:  Complains of stopped up Bilateral ear(s). EXAM: shows cerumen impaction. PLAN: Cerumen removed with various instruments (curettes, suction) giving subjective relief.  External canals and tympanic membranes are otherwise normal.  Return as needed.   Chronic bronchitis (HCC) 08/04/2014   Closed fracture of nasal bone with routine healing 11/02/2021   Last Assessment & Plan:  Formatting of this note might be different from the original. Nasal fracture. Fell with facial trauma about 3 weeks ago.  CT of the face is reviewed and shows minimally displaced nasal fracture.  Denies any nasal obstruction. EXAM shows mild deviation of the nasal dorsum.  Subtle.  Intranasal exam is unremarkable. 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    Patient Active Problem List   Diagnosis Date Noted   Hyponatremia 07/08/2024   Upper airway cough syndrome 07/03/2024   CKD  stage 3a, GFR 45-59 ml/min (HCC) 12/26/2023   COVID-19 12/26/2023   Obesity with serious comorbidity 06/18/2023   Chronic gastric ulcer 12/27/2022   Hiatal hernia 12/27/2022   Aortic atherosclerosis 12/27/2022   Small vessel disease 12/27/2022   Routine general medical examination at a health care facility 12/27/2022   Memory changes 12/27/2022   Vertigo 02/17/2022   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      Home Medications    Prior to Admission medications   Medication Sig Start Date End Date Taking? Authorizing Provider  amoxicillin-clavulanate (AUGMENTIN) 875-125 MG tablet Take 1 tablet by mouth every 12 (twelve) hours. 07/28/24  Yes Iola Lukes, FNP  azithromycin  (ZITHROMAX ) 250 MG tablet Take 1 tablet (250 mg total) by mouth daily. Take first 2 tablets together, then 1 every day until finished. 07/28/24  Yes Iola Lukes, FNP  benzonatate (TESSALON) 200 MG capsule Take 1 capsule (200 mg total) by mouth 3 (three) times daily as needed for cough. 07/28/24  Yes Iola Lukes, FNP  methylPREDNISolone (MEDROL DOSEPAK) 4 MG TBPK tablet Take as directed 07/28/24  Yes Logan Vegh, FNP  Apoaequorin (PREVAGEN PO) Take by mouth.    [provider]  calcium  citrate-vitamin D  (CITRACAL+D) 315-200 MG-UNIT per tablet Take 3 tablets by mouth daily.    [provider]  citalopram   (CELEXA ) 40 MG tablet TAKE 1 TABLET BY MOUTH EVERY DAY 12/24/23   Sebastian Beverley NOVAK, MD  denosumab  (PROLIA ) 60 MG/ML SOSY injection Inject 60 mg into the skin every 6 (six) months.    [provider]  esomeprazole  (NEXIUM ) 40 MG capsule Take 1 capsule (40 mg total) by mouth daily. 07/11/24   Sebastian Beverley NOVAK, MD  famotidine (PEPCID) 40 MG tablet Take 40 mg by mouth daily.    [provider]  fluticasone  (FLONASE ) 50 MCG/ACT nasal spray Place 2 sprays into both nostrils daily. 12/26/23   Sebastian Beverley NOVAK, MD  latanoprost (XALATAN) 0.005 % ophthalmic solution Place 1 drop into the right eye at bedtime. 04/29/24   [provider]  meclizine  (ANTIVERT ) 25 MG tablet Take 1 tablet (25 mg total) by mouth 3 (three) times daily as needed for dizziness. 04/04/14   Geroldine Berg, MD  melatonin 5 MG TABS Take 5 mg by mouth at bedtime as needed.    [provider]  Multiple Vitamin (MULTIVITAMIN WITH MINERALS) TABS tablet Take 1 tablet by mouth daily.    [provider]  rosuvastatin  (CRESTOR ) 40 MG tablet TAKE 1 TABLET BY MOUTH EVERY DAY FOR CHOLESTEROL 12/24/23   Sebastian Beverley NOVAK, MD    Family History Family History  Problem Relation Age of Onset  Cancer Mother        ovarian    Heart disease Father     Social History Social History   Tobacco Use   Smoking status: Former    Current packs/day: 0.00    Average packs/day: 0.5 packs/day for 15.0 years (7.5 ttl pk-yrs)    Types: Cigarettes    Start date: 02/28/1998    Quit date: 02/28/2013    Years since quitting: 11.4    Passive exposure: Never   Smokeless tobacco: Never  Vaping Use   Vaping status: Never Used  Substance Use Topics   Alcohol use: No   Drug use: No     Allergies   Patient has no known allergies.   Review of Systems Review of Systems  Constitutional:  Positive for fatigue. Negative for chills, diaphoresis and fever.  HENT:  Positive for congestion, rhinorrhea and sneezing. Negative  for sore throat.   Respiratory:  Positive for cough, chest tightness, shortness of breath and wheezing.   Cardiovascular:  Negative for chest pain, palpitations and leg swelling.  Gastrointestinal:  Negative for diarrhea, nausea and vomiting.  Musculoskeletal:  Positive for myalgias.  Neurological:  Positive for weakness (generalized) and headaches. Negative for dizziness.  All other systems reviewed and are negative.    Physical Exam Triage Vital Signs ED Triage Vitals [07/28/24 1130]  Encounter Vitals Group     BP 131/78     Girls Systolic BP Percentile      Girls Diastolic BP Percentile      Boys Systolic BP Percentile      Boys Diastolic BP Percentile      Pulse Rate 77     Resp (!) 25     Temp 98.4 F (36.9 C)     Temp Source Oral     SpO2 93 %     Weight      Height      Head Circumference      Peak Flow      Pain Score      Pain Loc      Pain Education      Exclude from Growth Chart    No data found.  Updated Vital Signs BP 131/78 (BP Location: Right Arm)   Pulse 77   Temp 98.4 F (36.9 C) (Oral)   Resp 18   SpO2 95%   Visual Acuity Right Eye Distance:   Left Eye Distance:   Bilateral Distance:    Right Eye Near:   Left Eye Near:    Bilateral Near:     Physical Exam Vitals reviewed.  Constitutional:      General: She is awake. She is not in acute distress.    Appearance: Normal appearance. She is well-developed and well-groomed. She is not ill-appearing, toxic-appearing or diaphoretic.  HENT:     Head: Normocephalic.     Right Ear: Hearing normal.     Left Ear: Hearing normal.     Nose: Congestion present.     Mouth/Throat:     Mouth: Mucous membranes are moist.     Pharynx: Oropharynx is clear. Uvula midline.  Eyes:     General: Vision grossly intact.     Conjunctiva/sclera: Conjunctivae normal.  Cardiovascular:     Rate and Rhythm: Normal rate and regular rhythm.     Heart sounds: Normal heart sounds.  Pulmonary:     Effort: Pulmonary  effort is normal. Tachypnea present. No accessory muscle usage or respiratory distress.     Breath  sounds: Normal air entry. Wheezing and rhonchi present.     Comments: Inspection reveals tachypnea with audible wheezing. No use of accessory muscles is observed. On auscultation, there is diffuse wheezing throughout both lung fields accompanied by lower lung rhonchi. Breath sounds are otherwise present bilaterally without focal crackles or diminished areas. The patient appears acutely ill but is not in acute distress. Musculoskeletal:        General: Normal range of motion.     Cervical back: Normal range of motion and neck supple.     Right lower leg: No edema.     Left lower leg: No edema.  Lymphadenopathy:     Cervical: No cervical adenopathy.  Skin:    General: Skin is warm and dry.  Neurological:     General: No focal deficit present.     Mental Status: She is alert and oriented to person, place, and time.  Psychiatric:        Speech: Speech normal.        Behavior: Behavior is cooperative.      UC Treatments / Results  Labs (all labs ordered are listed, but only abnormal results are displayed) Labs Reviewed  POC SOFIA SARS ANTIGEN FIA    EKG   Radiology DG Chest 2 View Result Date: 07/28/2024 CLINICAL DATA:  Cough.  COVID positive this morning at home. EXAM: CHEST - 2 VIEW COMPARISON:  07/03/2024 FINDINGS: Normal heart size and mediastinal contours. Aortic atherosclerosis. Chronic bronchial thickening. No consolidative airspace disease. No pleural effusion or pneumothorax. Chronic right proximal humeral deformity. IMPRESSION: Chronic bronchial thickening. No focal airspace disease. Electronically Signed   By: Andrea Gasman M.D.   On: 07/28/2024 13:18    Procedures Procedures (including critical care time)  Medications Ordered in UC Medications  ipratropium-albuterol (DUONEB) 0.5-2.5 (3) MG/3ML nebulizer solution 3 mL (3 mLs Nebulization Given 07/28/24 1203)   cefTRIAXone (ROCEPHIN) injection 1 g (1 g Intramuscular Given 07/28/24 1350)  methylPREDNISolone sodium succinate (SOLU-MEDROL) 125 mg/2 mL injection 80 mg (80 mg Intramuscular Given 07/28/24 1350)  albuterol (VENTOLIN HFA) 108 (90 Base) MCG/ACT inhaler 2 puff (2 puffs Inhalation Given 07/28/24 1354)  AeroChamber Plus Flo-Vu Medium MISC 1 each (1 each Other Given 07/28/24 1354)    Initial Impression / Assessment and Plan / UC Course  I have reviewed the triage vital signs and the nursing notes.  Pertinent labs & imaging results that were available during my care of the patient were reviewed by me and considered in my medical decision making (see chart for details).     The patient with a history of chronic cough and kidney disease presents with worsening cough and shortness of breath after a positive home COVID test this morning. Repeat testing in clinic was negative. On exam, she is tachypneic with diffuse wheezing and rhonchi, raising concern for acute bronchospasm and possible viral or post-infectious lower respiratory involvement. Given her recent imaging showing perihilar vascular congestion, underlying lung disease or cardiac contribution remains possible. Chest x-ray was performed today and is pending. The patient is currently receiving a DuoNeb treatment and will be reassessed afterward. Disposition, including possible emergency department transfer versus outpatient management, will be determined based on her post-nebulizer response and chest x-ray findings.  1345: On reassessment, the patient demonstrated clinical improvement with oxygen saturation increased to 95% and respiratory rate normalized to 18. She remains afebrile, in no acute distress, and reports that she feels better. Audible wheezing resolved and auscultation revealed improved lung sounds. Chest x-ray shows  chronic bronchial thickening but no evidence of focal airspace disease. Given the rapid onset of symptoms, negative in-clinic  COVID and influenza testing despite a positive home COVID test, and the clinical course, the presentation is most consistent with acute bronchitis, likely viral in origin.  Prior to discharge, she received albuterol inhaler therapy with an AeroChamber, which she was instructed to take home, along with injections of ceftriaxone and Solu-Medrol. She was advised to continue Mucinex  DM twice daily per label directions and her Flonase . Prescriptions were provided for Augmentin, azithromycin , medrol dose pack, and Tessalon Perles as needed for cough. She was instructed to follow up with her primary care provider later this week for reevaluation. Emergency department precautions were reviewed, including worsening shortness of breath, chest pain, or new systemic symptoms.  Today's evaluation has revealed no signs of a dangerous process. Discussed diagnosis with patient and/or guardian. Patient and/or guardian aware of their diagnosis, possible red flag symptoms to watch out for and need for close follow up. Patient and/or guardian understands verbal and written discharge instructions. Patient and/or guardian comfortable with plan and disposition.  Patient and/or guardian has a clear mental status at this time, good insight into illness (after discussion and teaching) and has clear judgment to make decisions regarding their care  Documentation was completed with the aid of voice recognition software. Transcription may contain typographical errors.  Final Clinical Impressions(s) / UC Diagnoses   Final diagnoses:  Cough, unspecified type  Acute bronchitis, unspecified organism     Discharge Instructions      You were seen today for breathing problems and cough. On recheck, your oxygen level improved to 95% and your breathing rate returned to normal. You are not in distress, your fever remains normal, and your lungs sound better than when you first arrived. Your chest x-ray showed some chronic bronchial  changes but no signs of pneumonia or other serious infection. Your symptoms are most likely caused by acute bronchitis, which is often related to a viral illness. In the clinic, you were given an albuterol inhaler with a spacer, an injection of antibiotics, and an injection of steroids to help calm the inflammation in your lungs. Use the albuterol inhaler every four to six hours if you feel tightness, wheezing, or shortness of breath. Make sure you rinse your mouth with water after use to avoid oral yeast infection. You should continue taking Mucinex  DM twice daily as directed on the package and use your Flonase  as prescribed. You have also been given prescriptions for antibiotics (Augmentin and azithromycin ), oral steroids, and Tessalon Perles to help with cough. Take these medicines exactly as directed, and do not stop early even if you feel better, unless advised by your doctor. To help manage your symptoms at home, rest and drink plenty of fluids. Warm fluids such as tea, broth, or honey water can soothe the throat, while using a humidifier or sitting in a steamy bathroom can make breathing easier. Avoid smoke or strong odors that may irritate your airways. Please schedule a follow-up with your primary care provider later this week for reevaluation. Seek emergency care immediately if your breathing suddenly worsens, you develop chest pain, dizziness, fainting, confusion, bluish lips or face, or if you cannot catch your breath even after using your inhaler.     ED Prescriptions     Medication Sig Dispense Auth. Provider   amoxicillin-clavulanate (AUGMENTIN) 875-125 MG tablet Take 1 tablet by mouth every 12 (twelve) hours. 14 tablet Iola Lukes, FNP  azithromycin  (ZITHROMAX ) 250 MG tablet Take 1 tablet (250 mg total) by mouth daily. Take first 2 tablets together, then 1 every day until finished. 6 tablet Iola Lukes, FNP   methylPREDNISolone (MEDROL DOSEPAK) 4 MG TBPK tablet Take as  directed 21 tablet Octavio Matheney, FNP   benzonatate (TESSALON) 200 MG capsule Take 1 capsule (200 mg total) by mouth 3 (three) times daily as needed for cough. 30 capsule Iola Lukes, FNP      PDMP not reviewed this encounter.   Iola Lukes, [place] 07/28/24 1356

## 2024-07-29 DIAGNOSIS — I359 Nonrheumatic aortic valve disorder, unspecified: Secondary | ICD-10-CM | POA: Insufficient documentation

## 2024-07-29 DIAGNOSIS — J432 Centrilobular emphysema: Secondary | ICD-10-CM | POA: Insufficient documentation

## 2024-07-29 DIAGNOSIS — J439 Emphysema, unspecified: Secondary | ICD-10-CM | POA: Insufficient documentation

## 2024-07-30 ENCOUNTER — Other Ambulatory Visit: Payer: Self-pay | Admitting: Medical Oncology

## 2024-07-30 DIAGNOSIS — D509 Iron deficiency anemia, unspecified: Secondary | ICD-10-CM

## 2024-07-30 DIAGNOSIS — I3481 Nonrheumatic mitral (valve) annulus calcification: Secondary | ICD-10-CM | POA: Diagnosis not present

## 2024-07-30 DIAGNOSIS — I350 Nonrheumatic aortic (valve) stenosis: Secondary | ICD-10-CM | POA: Diagnosis not present

## 2024-07-30 DIAGNOSIS — D631 Anemia in chronic kidney disease: Secondary | ICD-10-CM

## 2024-07-30 DIAGNOSIS — I517 Cardiomegaly: Secondary | ICD-10-CM | POA: Diagnosis not present

## 2024-07-30 DIAGNOSIS — I340 Nonrheumatic mitral (valve) insufficiency: Secondary | ICD-10-CM | POA: Diagnosis not present

## 2024-07-31 ENCOUNTER — Inpatient Hospital Stay: Admitting: Medical Oncology

## 2024-07-31 ENCOUNTER — Inpatient Hospital Stay

## 2024-08-01 ENCOUNTER — Telehealth: Payer: Self-pay

## 2024-08-01 ENCOUNTER — Encounter: Payer: Self-pay | Admitting: Nurse Practitioner

## 2024-08-01 ENCOUNTER — Ambulatory Visit (INDEPENDENT_AMBULATORY_CARE_PROVIDER_SITE_OTHER): Admitting: Nurse Practitioner

## 2024-08-01 VITALS — BP 126/82 | HR 72 | Temp 96.7°F | Ht 64.5 in | Wt 173.4 lb

## 2024-08-01 DIAGNOSIS — J439 Emphysema, unspecified: Secondary | ICD-10-CM | POA: Diagnosis not present

## 2024-08-01 DIAGNOSIS — J440 Chronic obstructive pulmonary disease with acute lower respiratory infection: Secondary | ICD-10-CM | POA: Diagnosis not present

## 2024-08-01 DIAGNOSIS — J209 Acute bronchitis, unspecified: Secondary | ICD-10-CM | POA: Diagnosis not present

## 2024-08-01 DIAGNOSIS — I350 Nonrheumatic aortic (valve) stenosis: Secondary | ICD-10-CM | POA: Diagnosis not present

## 2024-08-01 MED ORDER — TRELEGY ELLIPTA 100-62.5-25 MCG/ACT IN AEPB: 1.0000 | INHALATION_SPRAY | Freq: Every day | RESPIRATORY_TRACT | 1 refills | Status: DC

## 2024-08-01 NOTE — Progress Notes (Signed)
 Acute Office Visit  Subjective:     Patient ID: Kim Grant, female    DOB: 11/08/1940, 83 y.o.   MRN: [id number]  Chief Complaint  Patient presents with   Bronchitis    Follow up, went to urgent care on 07/28/24    HPI Discussed the use of AI scribe software for clinical note transcription with the patient, who gave verbal consent to proceed.  History of Present Illness   Kim Grant is an 83 year old female with aortic and mitral stenosis who presents with respiratory symptoms following a recent COVID-19 infection.  She tested positive for COVID-19 two to three months ago and again on Monday. She was prescribed steroids, Augmentin, and a Z-Pak, and received a nebulizer treatment at the urgent care. Her energy has improved, and she can sleep in her bed, but she still experiences shortness of breath, wheezing, and congestion. She has a sore throat but no ear pain or fever.  She has moderate to severe aortic stenosis and moderate mitral stenosis, with shortness of breath and breathing difficulties at times. A recent CT scan showed mild emphysema and airway thickening, with no infection or cancer. She uses an albuterol inhaler, Flonase , and Mucinex .  [place] concerned about her health during the trip.     ROS See pertinent positives and negatives per HPI.     Objective:    BP 126/82 (BP Location: Left Arm, Patient Position: Sitting, Cuff Size: Normal)   Pulse 72   Temp (!) 96.7 F (35.9 C)   Ht 5' 4.5 (1.638 m)   Wt 173 lb 6.4 oz (78.7 kg)   SpO2 97%   BMI 29.30 kg/m    Physical Exam Vitals and nursing note reviewed.  Constitutional:      General: She is not in acute distress.    Appearance: Normal appearance.  HENT:     Head: Normocephalic.     Right Ear: Tympanic membrane, ear canal and external ear normal.     Left Ear: Tympanic membrane, ear canal and external ear normal.     Nose: Congestion present.     Right Sinus: No  maxillary sinus tenderness or frontal sinus tenderness.     Left Sinus: No maxillary sinus tenderness or frontal sinus tenderness.     Mouth/Throat:     Pharynx: No posterior oropharyngeal erythema.  Eyes:     Conjunctiva/sclera: Conjunctivae normal.  Cardiovascular:     Rate and Rhythm: Normal rate and regular rhythm.     Pulses: Normal pulses.     Heart sounds: Normal heart sounds.  Pulmonary:     Effort: Pulmonary effort is normal.     Breath sounds: Wheezing present. No rhonchi.  Musculoskeletal:     Cervical back: Normal range of motion and neck supple. No tenderness.  Lymphadenopathy:     Cervical: No cervical adenopathy.  Skin:    General: Skin is warm.  Neurological:     General: No focal deficit present.     Mental Status: She is alert and oriented to person, place, and time.  Psychiatric:        Mood and Affect: Mood normal.        Behavior: Behavior normal.        Thought Content: Thought content normal.        Judgment: Judgment normal.      Assessment & Plan:   Problem List Items Addressed This Visit  Cardiovascular and Mediastinum   Aortic stenosis   Moderate to severe aortic stenosis and moderate mitral stenosis confirmed by echocardiogram. Advise scheduling an appointment with a cardiologist. Denies chest pain. Currently experiencing intermittent shortness of breath with lower respiratory infection.         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 Mild emphysema noted on CT scan. She is on steroids and antibiotics. Continue both medications. Prescribe Trelegy inhaler, one puff daily, and advise rinsing mouth after use. Continue albuterol inhaler every six hours as needed, along with Mucinex  and Flonase . Advise seeking medical attention [place].      Relevant Medications   Fluticasone -Umeclidin-Vilant (TRELEGY ELLIPTA)  100-62.5-25 MCG/ACT AEPB   Meds ordered this encounter  Medications   DISCONTD: Fluticasone -Umeclidin-Vilant (TRELEGY ELLIPTA) 100-62.5-25 MCG/ACT AEPB    Sig: Inhale 1 puff into the lungs daily.    Dispense:  60 each    Refill:  1   Fluticasone -Umeclidin-Vilant (TRELEGY ELLIPTA) 100-62.5-25 MCG/ACT AEPB    Sig: Inhale 1 puff into the lungs daily.    Dispense:  60 each    Refill:  1    Return if symptoms worsen or fail to improve.  Tinnie DELENA Harada, NP

## 2024-08-01 NOTE — Telephone Encounter (Signed)
 Copied from CRM [id number]. Topic: Clinical - Medication Question >> Aug 01, 2024  2:26 PM Alfonso ORN wrote: Reason for CRM: pt says copay for inhaler is $450 and wants to know is there something that could also work . Please advise >> Aug 01, 2024  4:11 PM Drema MATSU wrote: Patient called to see if pcp received message.   Pt needs to get a inhaler that is less expensive, please advise

## 2024-08-01 NOTE — Patient Instructions (Addendum)
 It was great to see you!  Start trelegy inhaler 1 puff once a day - rinse your mouth after using   Continue albuterol inhaler every 6 hours as needed for shortness of breath or wheezing   Finish the medrol and antibiotics   Drink plenty of fluids   Keep taking mucinex  and flonase   Let's follow-up if symptoms worsen or any concerns   Take care,  Tinnie Harada, NP

## 2024-08-01 NOTE — Assessment & Plan Note (Signed)
 Recent infection with wheezing, no pneumonia on x-ray. Mild emphysema noted on CT scan. She is on steroids and antibiotics. Continue both medications. Prescribe Trelegy inhaler, one puff daily, and advise rinsing mouth after use. Continue albuterol inhaler every six hours as needed, along with Mucinex  and Flonase . [place] medical attention [place]

## 2024-08-01 NOTE — Assessment & Plan Note (Addendum)
 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 Advise scheduling an appointment with a cardiologist. Denies chest pain. Currently experiencing intermittent shortness of breath with lower respiratory infection.

## 2024-08-02 MED ORDER — TIOTROPIUM BROMIDE-OLODATEROL 2.5-2.5 MCG/ACT IN AERS: 2.0000 | INHALATION_SPRAY | Freq: Every day | RESPIRATORY_TRACT | 1 refills | Status: DC

## 2024-08-02 NOTE — Telephone Encounter (Signed)
 Prescription for alternative inhaler Stiolto sent.  Please reach out to pharmacy to see if this is an insurance preferred alternative.  If not, please have pharmacy suggest their preferences.

## 2024-08-05 ENCOUNTER — Other Ambulatory Visit: Payer: Self-pay

## 2024-08-07 NOTE — Telephone Encounter (Signed)
 Called Pt to follow up and see if she received alternative inhaler; left VM for call back and sent MyChart message.

## 2024-08-12 ENCOUNTER — Ambulatory Visit: Admitting: Family Medicine

## 2024-08-13 ENCOUNTER — Ambulatory Visit: Admitting: Family Medicine

## 2024-08-13 ENCOUNTER — Encounter: Payer: Self-pay | Admitting: Family Medicine

## 2024-08-13 VITALS — BP 127/67 | HR 65 | Temp 97.2°F | Wt 174.4 lb

## 2024-08-13 DIAGNOSIS — K2270 Barrett's esophagus without dysplasia: Secondary | ICD-10-CM | POA: Diagnosis not present

## 2024-08-13 DIAGNOSIS — Z23 Encounter for immunization: Secondary | ICD-10-CM

## 2024-08-13 DIAGNOSIS — K219 Gastro-esophageal reflux disease without esophagitis: Secondary | ICD-10-CM | POA: Diagnosis not present

## 2024-08-13 DIAGNOSIS — J432 Centrilobular emphysema: Secondary | ICD-10-CM

## 2024-08-13 DIAGNOSIS — K449 Diaphragmatic hernia without obstruction or gangrene: Secondary | ICD-10-CM

## 2024-08-13 DIAGNOSIS — I342 Nonrheumatic mitral (valve) stenosis: Secondary | ICD-10-CM | POA: Diagnosis not present

## 2024-08-13 DIAGNOSIS — J301 Allergic rhinitis due to pollen: Secondary | ICD-10-CM

## 2024-08-13 DIAGNOSIS — I350 Nonrheumatic aortic (valve) stenosis: Secondary | ICD-10-CM | POA: Diagnosis not present

## 2024-08-13 NOTE — Progress Notes (Signed)
 Assessment & Plan   Assessment/Plan:    Assessment & Plan Centrilobular emphysema COPD with mild emphysema confirmed by CT chest showing mild emphysema and bilateral bronchial wall thickening. Spirometry showed mild obstructive pattern. Recent exacerbation treated with prednisone and azithromycin , and Augmentin, leading to symptom improvement. Albuterol inhaler used as needed for acute symptoms. Stiolto inhaler prescribed for maintenance but not yet started due to cost concerns. Reports significant improvement in symptoms after aggressive treatment at urgent care. 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 Symptoms to monitor include chest pain and shortness of breath. Cardiologist follow-up needed for further evaluation and management. 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 Previously on omeprazole, switched to esomeprazole  for better control of symptoms. GERD may contribute to chronic cough. - Continue esomeprazole  40 mg daily   Allergic rhinitis Allergic rhinitis contributing to postnasal drip and possibly chronic cough. Managed with fluticasone  nasal spray. - Continue fluticasone  nasal spray for allergic rhinitis  General Health Maintenance Proactive about vaccinations and plans to receive a shingles vaccine. Discussed importance of vaccinations in preventing illness. - Administer shingles vaccine      Medications Discontinued During This Encounter  Medication Reason   amoxicillin-clavulanate (AUGMENTIN) 875-125 MG tablet     azithromycin  (ZITHROMAX ) 250 MG tablet    methylPREDNISolone (MEDROL DOSEPAK) 4 MG TBPK tablet    benzonatate (TESSALON) 200 MG capsule      Return in about 3 months (around 11/13/2024) for Chronic cough.        Subjective:   Encounter date: 08/13/2024  Kim Grant is a 83 y.o. female who has Age-related osteoporosis without current pathological fracture; Barrett's esophagus without dysplasia; Episode of recurrent major depressive disorder; Mixed hyperlipidemia; Aortic stenosis; Erythropoietin  deficiency anemia; Primary osteoarthritis of right knee; Presbycusis; Primary osteoarthritis of left foot; Vertigo; Chronic gastric ulcer; Hiatal hernia; Aortic atherosclerosis; Small vessel disease; Routine general medical examination at a health care facility; Memory changes; Obesity with serious comorbidity; CKD stage 3a, GFR 45-59 ml/min (HCC); COVID-19; Upper airway cough syndrome; Hyponatremia; Aortic valve calcification; Centrilobular emphysema (HCC); Nonrheumatic mitral valve stenosis; Seasonal allergic rhinitis due to pollen; and Gastroesophageal reflux disease without esophagitis on their problem list..   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   She presents with chief complaint of Cough (1 month follow up. Pt stated she is doing well; and completed antibiotic  coarse //HM due- shingles vaccine ) .   Discussed the use of AI scribe software for clinical note transcription with the patient, who gave verbal consent to proceed.  History of Present Illness 07/11/2024  Kim Grant is an 83 year old female with chronic cough who presents for follow-up.  Chronic cough  and dyspnea - Chronic cough with intermittent improvement, recently worsened - Cough is productive, with phlegm sensation in the throat rather than the chest - Significant shortness of breath after exercise - History of significant tobacco use (26 pack years), quit in 2014 - Spirometry performed - Chest x-ray demonstrated peripheral vascular congestion and increased infiltrates compared to prior imaging - Known atherosclerotic calcification of the aortic arch on imaging - CT chest without contrast ordered but not yet scheduled  Gastroesophageal reflux and barrett's esophagus - GERD and Barrett's esophagus managed with famotidine 40 mg daily and omeprazole 40 mg daily - Occasional reflux symptoms, especially after eating too fast, too much, or after consuming strawberries and blueberries at night - Weight loss efforts ongoing; follows a bland diet primarily consisting of chicken, with occasional ice cream - Sleeps on three pillows to manage reflux symptoms  Allergic rhinitis - Seasonal allergic rhinitis treated with fluticasone  nasal spray, two sprays in each nostril daily  Chronic kidney disease and anemia - CKD stage 3A with stable iron levels - Stable anemia - Recent laboratory studies including PTH, B12, folate, urinalysis, vitamin D , hemoglobin A1c, TSH, and lipid panel all within normal limits  08/13/2024  Kim Grant is an 83 year old female with emphysema who presents with chronic cough.  Chronic cough and respiratory symptoms - Chronic cough initially severe, causing difficulty breathing and speaking - Significant improvement after treatment with prednisone and  azithromycin  at urgent care - Cough has mostly resolved; occasional coughing persists, attributed to bronchitis - Remote smoking history - Occasional wheezing and shortness of breath, particularly with exertion - Uses albuterol inhaler as needed, especially before workouts if feeling winded - Prescribed Stiolto inhaler for maintenance but has not obtained due to cost concerns  Pulmonary imaging and function - CT chest shows mild emphysema and bilateral bronchial wall thickening - Previous spirometry demonstrates mild obstructive patterns  Gastroesophageal symptoms and findings - History of GERD and Barrett's esophagus - Switched from omeprazole to esomeprazole  for management - Imaging reveals small hiatal hernia  Allergic rhinitis - History of allergies - Started on fluticasone  nasal spray  Cardiac valve disease - History of aortic stenosis and mitral valve stenosis - Recent echocardiogram indicates worsening of both conditions - Aortic valve calcifications present on imaging - Has not yet seen a cardiologist for these findings  Functional status and activity tolerance - Remains active, [place] - Resumed workouts, including biking for thirteen minutes - Feels winded with exertion but not excessively so - Motivated to maintain exercise routine   Office Spirometry Results:    Ht Readings from Last 3 Encounters:  08/01/24 5' 4.5 (1.638 m)  06/06/24 5' 4.5 (1.638 m)  05/01/24 5' 4 (1.626 m)    ROS  Past Surgical History:  Procedure Laterality Date   ABDOMINAL HYSTERECTOMY     BREAST SURGERY     KNEE SURGERY Right    ROTATOR CUFF REPAIR      Outpatient Medications Prior to Visit  Medication Sig Dispense Refill   albuterol (VENTOLIN HFA) 108 (90 Base) MCG/ACT inhaler Inhale 2 puffs into the lungs every 6 (six) hours as needed for wheezing or shortness of breath.     Apoaequorin (PREVAGEN PO) Take by mouth.     calcium   citrate-vitamin D  (CITRACAL+D) 315-200 MG-UNIT per tablet Take 3 tablets by mouth daily.     citalopram  (CELEXA ) 40 MG tablet TAKE 1 TABLET BY MOUTH EVERY DAY 90 tablet 3  denosumab  (PROLIA ) 60 MG/ML SOSY injection Inject 60 mg into the skin every 6 (six) months.     esomeprazole  (NEXIUM ) 40 MG capsule Take 1 capsule (40 mg total) by mouth daily. 90 capsule 0   famotidine (PEPCID) 40 MG tablet Take 40 mg by mouth daily.     fluticasone  (FLONASE ) 50 MCG/ACT nasal spray Place 2 sprays into both nostrils daily. 16 g 6   latanoprost (XALATAN) 0.005 % ophthalmic solution Place 1 drop into the right eye at bedtime.     melatonin 5 MG TABS Take 5 mg by mouth at bedtime as needed.     Multiple Vitamin (MULTIVITAMIN WITH MINERALS) TABS tablet Take 1 tablet by mouth daily.     rosuvastatin  (CRESTOR ) 40 MG tablet TAKE 1 TABLET BY MOUTH EVERY DAY FOR CHOLESTEROL 90 tablet 3   meclizine  (ANTIVERT ) 25 MG tablet Take 1 tablet (25 mg total) by mouth 3 (three) times daily as needed for dizziness. 15 tablet 0   Tiotropium Bromide-Olodaterol 2.5-2.5 MCG/ACT AERS Inhale 2 puffs into the lungs daily. (Patient not taking: Reported on 08/13/2024) 4 g 1   amoxicillin-clavulanate (AUGMENTIN) 875-125 MG tablet Take 1 tablet by mouth every 12 (twelve) hours. (Patient not taking: Reported on 08/13/2024) 14 tablet 0   azithromycin  (ZITHROMAX ) 250 MG tablet Take 1 tablet (250 mg total) by mouth daily. Take first 2 tablets together, then 1 every day until finished. (Patient not taking: Reported on 08/13/2024) 6 tablet 0   benzonatate (TESSALON) 200 MG capsule Take 1 capsule (200 mg total) by mouth 3 (three) times daily as needed for cough. (Patient not taking: Reported on 08/13/2024) 30 capsule 0   methylPREDNISolone (MEDROL DOSEPAK) 4 MG TBPK tablet Take as directed (Patient not taking: Reported on 08/13/2024) 21 tablet 0   No facility-administered medications prior to visit.    Family History  Problem Relation Age of Onset    Cancer Mother        ovarian    Heart disease Father     Social History   Socioeconomic History   Marital status: Widowed    Spouse name: Not on file   Number of children: Not on file   Years of education: Not on file   Highest education level: Not on file  Occupational History   Not on file  Tobacco Use   Smoking status: Former    Current packs/day: 0.00    Average packs/day: 0.5 packs/day for 15.0 years (7.5 ttl pk-yrs)    Types: Cigarettes    Start date: 02/28/1998    Quit date: 02/28/2013    Years since quitting: 11.4    Passive exposure: Never   Smokeless tobacco: Never  Vaping Use   Vaping status: Never Used  Substance and Sexual Activity   Alcohol use: No   Drug use: No   Sexual activity: Not Currently  Other Topics Concern   Not on file  Social History Narrative   Pt works part time at [employer] hospital in [place] x 18 years. She is widowed, has 2 grown sons. 2 grand daughters, 1 great granddaughter who is 1yo.    Social Drivers of Corporate investment banker Strain: Low Risk  (06/06/2024)   Overall Financial Resource Strain (CARDIA)    Difficulty of Paying Living Expenses: Not hard at all  Food Insecurity: No Food Insecurity (06/06/2024)   Hunger Vital Sign    Worried About Running Out of Food in the Last Year: Never true    Ran Out  of Food in the Last Year: Never true  Transportation Needs: No Transportation Needs (06/06/2024)   PRAPARE - Administrator, Civil Service (Medical): No    Lack of Transportation (Non-Medical): No  Physical Activity: Insufficiently Active (06/06/2024)   Exercise Vital Sign    Days of Exercise per Week: 2 days    Minutes of Exercise per Session: 60 min  Stress: No Stress Concern Present (06/06/2024)   [employer] of Occupational Health - Occupational Stress Questionnaire    Feeling of Stress: Not at all  Social Connections: Socially Isolated (06/06/2024)   Social Connection and Isolation Panel    Frequency of  Communication with Friends and Family: More than three times a week    Frequency of Social Gatherings with Friends and Family: More than three times a week    Attends Religious Services: Never    Database administrator or Organizations: No    Attends Banker Meetings: Never    Marital Status: Widowed  Intimate Partner Violence: Not At Risk (06/06/2024)   Humiliation, Afraid, Rape, and Kick questionnaire    Fear of Current or Ex-Partner: No    Emotionally Abused: No    Physically Abused: No    Sexually Abused: No                                                                                                  Objective:  Physical Exam: BP 127/67 (BP Location: Left Arm, Patient Position: Sitting, Cuff Size: Large)   Pulse 65   Temp (!) 97.2 F (36.2 C) (Temporal)   Wt 174 lb 6.4 oz (79.1 kg)   SpO2 96%   BMI 29.47 kg/m    Physical Exam GENERAL: Alert, cooperative, well developed, no acute distress HEENT: Normocephalic, normal oropharynx, moist mucous membranes CHEST: Clear to auscultation bilaterally, No wheezes, rhonchi, or crackles CARDIOVASCULAR: Normal heart rate and rhythm, S1 and S2 normal without murmurs ABDOMEN: Soft, non-tender, non-distended, without organomegaly, Normal bowel sounds EXTREMITIES: No cyanosis or edema NEUROLOGICAL: Cranial nerves grossly intact, Moves all extremities without gross motor or sensory deficit   GENERAL: Alert, cooperative, well developed, no acute distress HEENT: Normocephalic, normal oropharynx, moist mucous membranes CHEST: Clear to auscultation bilaterally, no wheezes, rhonchi, or crackles CARDIOVASCULAR: Normal heart rate and rhythm, S1 and S2 normal without murmurs ABDOMEN: Soft, non-tender, non-distended, without organomegaly, normal bowel sounds EXTREMITIES: No cyanosis or edema NEUROLOGICAL: Cranial nerves grossly intact, moves all extremities without gross motor or sensory deficit  Results RADIOLOGY Chest X-ray:  Peripheral vascular congestion, increased infiltrates, atherosclerotic calcification of the aortic arch  DIAGNOSTIC Spirometry: Mild Obstructive   RADIOLOGY CT chest: Mild emphysema and bilateral bronchial wall thickening Chest X-ray: Peripheral vascular congestion  DIAGNOSTIC Spirometry: Mildly obstructive Echocardiogram: Worsening aortic stenosis and mitral valve stenosis    Physical Exam  CT CHEST WO CONTRAST Result Date: 07/29/2024 CLINICAL DATA:  Chronic cough, obstructive lung disease, former smoker EXAM: CT CHEST WITHOUT CONTRAST TECHNIQUE: Multidetector CT imaging of the chest was performed following the standard protocol without IV contrast. RADIATION DOSE REDUCTION: This exam was performed  according to the departmental dose-optimization program which includes automated exposure control, adjustment of the mA and/or kV according to patient size and/or use of iterative reconstruction technique. COMPARISON:  Chest radiographs, 07/03/2024 FINDINGS: Cardiovascular: Aortic atherosclerosis. Aortic valve calcifications. Normal heart size. No pericardial effusion. Mediastinum/Nodes: No enlarged mediastinal, hilar, or axillary lymph nodes. Small hiatal hernia. Thyroid  gland, trachea, and esophagus demonstrate no significant findings. Lungs/Pleura: Mild centrilobular and paraseptal emphysema. Minimal diffuse bilateral bronchial wall thickening. Minimal biapical pleural-parenchymal scarring. No pleural effusion or pneumothorax. Upper Abdomen: No acute abnormality. Musculoskeletal: No chest wall abnormality. No acute osseous findings. IMPRESSION: 1. Mild emphysema and diffuse bilateral bronchial wall thickening. 2. Small hiatal hernia. 3. Aortic valve calcifications. Correlate for echocardiographic evidence of aortic valve dysfunction. Aortic Atherosclerosis (ICD10-I70.0) and Emphysema (ICD10-J43.9). Electronically Signed   By: Marolyn JONETTA Jaksch M.D.   On: 07/29/2024 10:54   DG Chest 2 View Result  Date: 07/28/2024 CLINICAL DATA:  Cough.  COVID positive this morning at home. EXAM: CHEST - 2 VIEW COMPARISON:  07/03/2024 FINDINGS: Normal heart size and mediastinal contours. Aortic atherosclerosis. Chronic bronchial thickening. No consolidative airspace disease. No pleural effusion or pneumothorax. Chronic right proximal humeral deformity. IMPRESSION: Chronic bronchial thickening. No focal airspace disease. Electronically Signed   By: Andrea Gasman M.D.   On: 07/28/2024 13:18   DG Chest 2 View Result Date: 07/08/2024 EXAM: 2 VIEW(S) XRAY OF THE CHEST 07/03/2024 11:41:32 AM COMPARISON: 04/04/2021 CLINICAL HISTORY: Cough. Chronic cough. Hx of smoking tobacco. Quit 7 years ago. FINDINGS: LUNGS AND PLEURA: Perihilar vascular congestion or infiltrates, increased from previous. No pleural effusion. No pneumothorax. HEART AND MEDIASTINUM: Atherosclerotic calcification of aortic arch. No acute abnormality of the cardiac and mediastinal silhouettes. BONES AND SOFT TISSUES: Old fracture deformity of right proximal humerus. No acute osseous abnormality. IMPRESSION: 1. Perihilar vascular congestion or infiltrates, increased from previous. 2. Atherosclerotic calcification of aortic arch. 3. Old fracture deformity of right proximal humerus. Electronically signed by: Katheleen Faes MD 07/08/2024 01:28 PM EDT RP Workstation: [id number]    Recent Results (from the past 2160 hours)  CBC (Cancer Center Only)     Status: Abnormal   Collection Time: 06/02/24  2:51 PM  Result Value Ref Range   WBC Count 7.5 4.0 - 10.5 K/uL   RBC 4.08 3.87 - 5.11 MIL/uL   Hemoglobin 11.0 (L) 12.0 - 15.0 g/dL   HCT 65.9 (L) 63.9 - 53.9 %   MCV 83.3 80.0 - 100.0 fL   MCH 27.0 26.0 - 34.0 pg   MCHC 32.4 30.0 - 36.0 g/dL   RDW 85.6 88.4 - 84.4 %   Platelet Count 294 150 - 400 K/uL   nRBC 0.0 0.0 - 0.2 %    Comment: Performed at [facility], [address] [place], [place] [address]  TSH     Status: None   Collection  Time: 07/03/24 11:17 AM  Result Value Ref Range   TSH 0.84 0.35 - 5.50 uIU/mL  Lipid panel     Status: None   Collection Time: 07/03/24 11:17 AM  Result Value Ref Range   Cholesterol 168 0 - 200 mg/dL    Comment: ATP III Classification       Desirable:  < 200 mg/dL               Borderline High:  200 - 239 mg/dL          High:  > = 759 mg/dL   Triglycerides 885.9 0.0 - 149.0 mg/dL  Comment: Normal:  <150 mg/dLBorderline High:  150 - 199 mg/dL   HDL 25.79 >60.99 mg/dL   VLDL 77.1 0.0 - 59.9 mg/dL   LDL Cholesterol 71 0 - 99 mg/dL   Total CHOL/HDL Ratio 2     Comment:                Men          Women1/2 Average Risk     3.4          3.3Average Risk          5.0          4.42X Average Risk          9.6          7.13X Average Risk          15.0          11.0                       NonHDL 94.22     Comment: NOTE:  Non-HDL goal should be 30 mg/dL higher than patient's LDL goal (i.e. LDL goal of < 70 mg/dL, would have non-HDL goal of < 100 mg/dL)  Hemoglobin J8r     Status: None   Collection Time: 07/03/24 11:17 AM  Result Value Ref Range   Hgb A1c MFr Bld 6.2 4.6 - 6.5 %    Comment: Glycemic Control Guidelines for People with Diabetes:Non Diabetic:  <6%Goal of Therapy: <7%Additional Action Suggested:  >8%   Microalbumin / creatinine urine ratio     Status: None   Collection Time: 07/03/24 11:17 AM  Result Value Ref Range   Microalb, Ur <0.7 mg/dL   Creatinine,U 52.4 mg/dL   Microalb Creat Ratio Unable to calculate 0.0 - 30.0 mg/g    Comment: Unable to Calculate due to Microalbumin Result of <0.7 mg/dL  Vitamin D  1,25 dihydroxy     Status: None   Collection Time: 07/03/24 11:17 AM  Result Value Ref Range   Vitamin D  1, 25 (OH)2 Total 24 18 - 72 pg/mL   Vitamin D3 1, 25 (OH)2 24 pg/mL   Vitamin D2 1, 25 (OH)2 <8 pg/mL    Comment: (Note) Vitamin D3, 1,25(OH)2 indicates both endogenous  production and supplementation. Vitamin D2, 1,25(OH)2 is  an indicator of exogenous sources, such as  diet or  supplementation. Interpretation and therapy are based on  measurement of Vitamin D , 1,25 (OH)2, Total. . This test was developed, and its analytical performance  characteristics have been determined by [employer]. It has not been cleared or approved by the  FDA. This assay has been validated pursuant to the CLIA  regulations and is used for clinical purposes. . For additional information, please refer to http://education.[facility].com/faq/FAQ199 (This link is being provided for  informational/educational purposes only.) . [facility] [address] [place] [address] [phone] Johanna Agent L. Gino, MD, PhD   CBC with Differential/Platelet     Status: Abnormal   Collection Time: 07/03/24 11:17 AM  Result Value Ref Range   WBC 6.0 4.0 - 10.5 K/uL   RBC 4.07 3.87 - 5.11 Mil/uL   Hemoglobin 11.0 (L) 12.0 - 15.0 g/dL   HCT 66.8 (L) 63.9 - 53.9 %   MCV 81.2 78.0 - 100.0 fl   MCHC 33.2 30.0 - 36.0 g/dL   RDW 84.6 88.4 - 84.4 %   Platelets 277.0 150.0 - 400.0 K/uL  Neutrophils Relative % 65.4 43.0 - 77.0 %   Lymphocytes Relative 21.8 12.0 - 46.0 %   Monocytes Relative 7.7 3.0 - 12.0 %   Eosinophils Relative 4.4 0.0 - 5.0 %   Basophils Relative 0.7 0.0 - 3.0 %   Neutro Abs 3.9 1.4 - 7.7 K/uL   Lymphs Abs 1.3 0.7 - 4.0 K/uL   Monocytes Absolute 0.5 0.1 - 1.0 K/uL   Eosinophils Absolute 0.3 0.0 - 0.7 K/uL   Basophils Absolute 0.0 0.0 - 0.1 K/uL  Comprehensive metabolic panel with GFR     Status: Abnormal   Collection Time: 07/03/24 11:17 AM  Result Value Ref Range   Sodium 129 (L) 135 - 145 mEq/L   Potassium 4.1 3.5 - 5.1 mEq/L   Chloride 97 96 - 112 mEq/L   CO2 26 19 - 32 mEq/L   Glucose, Bld 77 70 - 99 mg/dL   BUN 19 6 - 23 mg/dL   Creatinine, Ser 8.97 0.40 - 1.20 mg/dL   Total Bilirubin 0.4 0.2 - 1.2 mg/dL   Alkaline Phosphatase 43 39 - 117 U/L   AST 27 0 - 37 U/L   ALT 22 0 - 35 U/L   Total Protein 7.2 6.0 -  8.3 g/dL   Albumin 4.4 3.5 - 5.2 g/dL   GFR 48.72 (L) >39.99 mL/min    Comment: Calculated using the CKD-EPI Creatinine Equation (2021)   Calcium  9.6 8.4 - 10.5 mg/dL  Urinalysis w microscopic + reflex cultur     Status: None   Collection Time: 07/03/24 11:17 AM   Specimen: Blood  Result Value Ref Range   Color, Urine YELLOW YELLOW   APPearance CLEAR CLEAR   Specific Gravity, Urine 1.010 1.001 - 1.035   pH 7.0 5.0 - 8.0   Glucose, UA NEGATIVE NEGATIVE   Bilirubin Urine NEGATIVE NEGATIVE   Ketones, ur NEGATIVE NEGATIVE   Hgb urine dipstick NEGATIVE NEGATIVE   Protein, ur NEGATIVE NEGATIVE   Nitrites, Initial NEGATIVE NEGATIVE   Leukocyte Esterase NEGATIVE NEGATIVE   WBC, UA NONE SEEN 0 - 5 /HPF   RBC / HPF NONE SEEN 0 - 2 /HPF   Squamous Epithelial / HPF NONE SEEN < OR = 5 /HPF   Bacteria, UA NONE SEEN NONE SEEN /HPF   Hyaline Cast NONE SEEN NONE SEEN /LPF  Iron, TIBC and Ferritin Panel     Status: None   Collection Time: 07/03/24 11:17 AM  Result Value Ref Range   Iron 95 45 - 160 mcg/dL   TIBC 662 749 - 549 mcg/dL (calc)   %SAT 28 16 - 45 % (calc)   Ferritin 49 16 - 288 ng/mL  B12 and Folate Panel     Status: None   Collection Time: 07/03/24 11:17 AM  Result Value Ref Range   Vitamin B-12 725 211 - 911 pg/mL   Folate >22.9 >5.9 ng/mL  PTH, intact (no Ca)     Status: None   Collection Time: 07/03/24 11:17 AM  Result Value Ref Range   PTH 26 16 - 77 pg/mL    Comment: . Interpretive Guide    Intact PTH           Calcium  ------------------    ----------           ------- Normal Parathyroid     Normal               Normal Hypoparathyroidism    Low or Low Normal    Low  Hyperparathyroidism    Primary            Normal or High       High    Secondary          High                 Normal or Low    Tertiary           High                 High Non-Parathyroid     Hypercalcemia      Low or Low Normal    High .   Phosphorus     Status: None   Collection Time: 07/03/24 11:17  AM  Result Value Ref Range   Phosphorus 4.2 2.3 - 4.6 mg/dL  Extra Specimen     Status: None   Collection Time: 07/03/24 11:17 AM  Result Value Ref Range   Extra tube recieved      Comment: An extra specimen was received with no test requested. The specimen will be maintained in storage in case  additional testing is needed. Please call the client service department for further assistance. SABRA    Specimen type recieved Frozen Serum;   REFLEXIVE URINE CULTURE     Status: None   Collection Time: 07/03/24 11:17 AM  Result Value Ref Range   Reflexve Urine Culture      Comment: NO CULTURE INDICATED  CBC     Status: Abnormal   Collection Time: 07/03/24  2:43 PM  Result Value Ref Range   WBC 7.3 4.0 - 10.5 K/uL   RBC 3.85 (L) 3.87 - 5.11 MIL/uL   Hemoglobin 10.4 (L) 12.0 - 15.0 g/dL   HCT 67.9 (L) 63.9 - 53.9 %   MCV 83.1 80.0 - 100.0 fL   MCH 27.0 26.0 - 34.0 pg   MCHC 32.5 30.0 - 36.0 g/dL   RDW 85.4 88.4 - 84.4 %   Platelets 281 150 - 400 K/uL   nRBC 0.0 0.0 - 0.2 %    Comment: Performed at [facility] [facility], [address] [facility] [address] [place], [place] [address]  CMP (Cancer Center only)     Status: Abnormal   Collection Time: 07/03/24  2:43 PM  Result Value Ref Range   Sodium 137 135 - 145 mmol/L   Potassium 4.4 3.5 - 5.1 mmol/L   Chloride 100 98 - 111 mmol/L   CO2 27 22 - 32 mmol/L   Glucose, Bld 110 (H) 70 - 99 mg/dL    Comment: Glucose reference range applies only to samples taken after fasting for at least 8 hours.   BUN 20 8 - 23 mg/dL   Creatinine 8.86 (H) 9.55 - 1.00 mg/dL   Calcium  9.7 8.9 - 10.3 mg/dL   Total Protein 6.9 6.5 - 8.1 g/dL   Albumin 4.5 3.5 - 5.0 g/dL   AST 31 15 - 41 U/L   ALT 25 0 - 44 U/L   Alkaline Phosphatase 54 38 - 126 U/L   Total Bilirubin 0.3 0.0 - 1.2 mg/dL   GFR, Estimated 48 (L) >60 mL/min    Comment: (NOTE) Calculated using the CKD-EPI Creatinine Equation (2021)    Anion gap 10 5 - 15    Comment: Performed at [facility], [address]  [facility] [address] [address] [facility] [address] [place], [place] [address]  Iron and Iron Binding Capacity ([facility] only)     Status: None   Collection Time: 07/03/24  2:43 PM  Result Value Ref Range   Iron 98 28 - 170 ug/dL   TIBC 625 749 - 549 ug/dL   Saturation Ratios 26 10.4 - 31.8 %   UIBC 276 ug/dL    Comment: Performed at [facility], [address], [place], [place] [address]  Ferritin     Status: None   Collection Time: 07/03/24  2:43 PM  Result Value Ref Range   Ferritin 82 11 - 307 ng/mL    Comment: Performed at [facility], [address] [place], [place] [address]  Retic Panel     Status: Abnormal   Collection Time: 07/03/24  2:43 PM  Result Value Ref Range   Retic Ct Pct 0.9 0.4 - 3.1 %   RBC. 3.79 (L) 3.87 - 5.11 MIL/uL   Retic Count, Absolute 32.6 19.0 - 186.0 K/uL   Immature Retic Fract 4.6 2.3 - 15.9 %   Reticulocyte Hemoglobin 32.0 >27.9 pg    Comment: Performed at [facility], [address] [place], [place] [address]  Basic metabolic panel with GFR     Status: Abnormal   Collection Time: 07/11/24  3:01 PM  Result Value Ref Range   Glucose, Bld 92 65 - 99 mg/dL    Comment: .            Fasting reference interval .    BUN 20 7 - 25 mg/dL   Creat 8.92 (H) 9.39 - 0.95 mg/dL   eGFR 52 (L) > OR = 60 mL/min/1.43m2   BUN/Creatinine Ratio 19 6 - 22 (calc)   Sodium 132 (L) 135 - 146 mmol/L   Potassium 5.3 3.5 - 5.3 mmol/L   Chloride 98 98 - 110 mmol/L   CO2 24 20 - 32 mmol/L   Calcium  10.0 8.6 - 10.4 mg/dL  POC SARS Coronavirus 2 Ag     Status: None   Collection Time: 07/28/24 12:32 PM  Result Value Ref Range   SARS Coronavirus 2 Ag Negative Negative        Beverley Adine Hummer, MD, MS

## 2024-08-19 ENCOUNTER — Other Ambulatory Visit: Payer: Self-pay

## 2024-08-19 ENCOUNTER — Inpatient Hospital Stay: Attending: Hematology & Oncology

## 2024-08-19 ENCOUNTER — Inpatient Hospital Stay (HOSPITAL_BASED_OUTPATIENT_CLINIC_OR_DEPARTMENT_OTHER): Admitting: Medical Oncology

## 2024-08-19 ENCOUNTER — Inpatient Hospital Stay

## 2024-08-19 DIAGNOSIS — D631 Anemia in chronic kidney disease: Secondary | ICD-10-CM | POA: Insufficient documentation

## 2024-08-19 DIAGNOSIS — D509 Iron deficiency anemia, unspecified: Secondary | ICD-10-CM | POA: Diagnosis not present

## 2024-08-19 DIAGNOSIS — N189 Chronic kidney disease, unspecified: Secondary | ICD-10-CM

## 2024-08-19 DIAGNOSIS — N1831 Chronic kidney disease, stage 3a: Secondary | ICD-10-CM | POA: Diagnosis not present

## 2024-08-19 LAB — CBC WITH DIFFERENTIAL (CANCER CENTER ONLY)
Abs Immature Granulocytes: 0.01 K/uL (ref 0.00–0.07)
Basophils Absolute: 0 K/uL (ref 0.0–0.1)
Basophils Relative: 1 %
Eosinophils Absolute: 0.2 K/uL (ref 0.0–0.5)
Eosinophils Relative: 4 %
HCT: 30.8 % — ABNORMAL LOW (ref 36.0–46.0)
Hemoglobin: 10 g/dL — ABNORMAL LOW (ref 12.0–15.0)
Immature Granulocytes: 0 %
Lymphocytes Relative: 22 %
Lymphs Abs: 1.4 K/uL (ref 0.7–4.0)
MCH: 26.9 pg (ref 26.0–34.0)
MCHC: 32.5 g/dL (ref 30.0–36.0)
MCV: 82.8 fL (ref 80.0–100.0)
Monocytes Absolute: 0.4 K/uL (ref 0.1–1.0)
Monocytes Relative: 6 %
Neutro Abs: 4.3 K/uL (ref 1.7–7.7)
Neutrophils Relative %: 67 %
Platelet Count: 233 K/uL (ref 150–400)
RBC: 3.72 MIL/uL — ABNORMAL LOW (ref 3.87–5.11)
RDW: 15 % (ref 11.5–15.5)
WBC Count: 6.3 K/uL (ref 4.0–10.5)
nRBC: 0 % (ref 0.0–0.2)

## 2024-08-19 LAB — IRON AND IRON BINDING CAPACITY (CC-WL,HP ONLY)
Iron: 92 ug/dL (ref 28–170)
Saturation Ratios: 26 % (ref 10.4–31.8)
TIBC: 356 ug/dL (ref 250–450)
UIBC: 264 ug/dL

## 2024-08-19 LAB — CMP (CANCER CENTER ONLY)
ALT: 22 U/L (ref 0–44)
AST: 28 U/L (ref 15–41)
Albumin: 4.3 g/dL (ref 3.5–5.0)
Alkaline Phosphatase: 51 U/L (ref 38–126)
Anion gap: 10 (ref 5–15)
BUN: 18 mg/dL (ref 8–23)
CO2: 24 mmol/L (ref 22–32)
Calcium: 9.7 mg/dL (ref 8.9–10.3)
Chloride: 103 mmol/L (ref 98–111)
Creatinine: 1.13 mg/dL — ABNORMAL HIGH (ref 0.44–1.00)
GFR, Estimated: 48 mL/min — ABNORMAL LOW (ref >60)
Glucose, Bld: 117 mg/dL — ABNORMAL HIGH (ref 70–99)
Potassium: 4.3 mmol/L (ref 3.5–5.1)
Sodium: 137 mmol/L (ref 135–145)
Total Bilirubin: 0.3 mg/dL (ref 0.0–1.2)
Total Protein: 6.9 g/dL (ref 6.5–8.1)

## 2024-08-19 LAB — FERRITIN: Ferritin: 82 ng/mL (ref 11–307)

## 2024-08-19 MED ORDER — EPOETIN ALFA-EPBX 40000 UNIT/ML IJ SOLN
40000.0000 [IU] | Freq: Once | INTRAMUSCULAR | Status: AC
  Administered 2024-08-19: 40000 [IU] via SUBCUTANEOUS
  Filled 2024-08-19: qty 1

## 2024-08-19 NOTE — Patient Instructions (Signed)

## 2024-08-19 NOTE — Progress Notes (Signed)
 Hematology and Oncology Follow Up Visit  Kim Grant [id number] [birth year] 83 y.o. 08/19/2024   Principle Diagnosis:  Erythropoietin  deficiency anemia    Current Therapy:        Retacrit  40,000 units SQ for Hgb < 11               Interim History:  Kim Grant is here today for follow-up and injection.   Today she states that she has been doing well. She is recovering from bronchitis that she had back about 2 weeks ago. Due to this she missed her last Retacrit  appointment.    No blood loss, bruising or petechiae noted.  No fever, chills, n/v, cough, rash, SOB, chest pain, palpitations, abdominal pain or changes in bowel or bladder habits.  No swelling in her extremities.  No falls or syncope reported.  Appetite and hydration ar good.   Wt Readings from Last 3 Encounters:  08/19/24 (P) 176 lb (79.8 kg)  08/13/24 174 lb 6.4 oz (79.1 kg)  08/01/24 173 lb 6.4 oz (78.7 kg)   ECOG Performance Status: 1 - Symptomatic but completely ambulatory  Medications:  Allergies as of 08/19/2024   No Known Allergies      Medication List        Accurate as of August 19, 2024  3:09 PM. If you have any questions, ask your nurse or doctor.          albuterol 108 (90 Base) MCG/ACT inhaler Commonly known as: VENTOLIN HFA Inhale 2 puffs into the lungs every 6 (six) hours as needed for wheezing or shortness of breath.   calcium  citrate-vitamin D  315-200 MG-UNIT tablet Commonly known as: CITRACAL+D Take 3 tablets by mouth daily.   citalopram  40 MG tablet Commonly known as: CELEXA  TAKE 1 TABLET BY MOUTH EVERY DAY   denosumab  60 MG/ML Sosy injection Commonly known as: PROLIA  Inject 60 mg into the skin every 6 (six) months.   esomeprazole  40 MG capsule Commonly known as: NexIUM  Take 1 capsule (40 mg total) by mouth daily.   famotidine 40 MG tablet Commonly known as: PEPCID Take 40 mg by mouth daily.   fluticasone  50 MCG/ACT nasal spray Commonly known as: FLONASE  Place 2 sprays  into both nostrils daily.   latanoprost 0.005 % ophthalmic solution Commonly known as: XALATAN Place 1 drop into the right eye at bedtime.   meclizine  25 MG tablet Commonly known as: ANTIVERT  Take 1 tablet (25 mg total) by mouth 3 (three) times daily as needed for dizziness.   melatonin 5 MG Tabs Take 5 mg by mouth at bedtime as needed.   multivitamin with minerals Tabs tablet Take 1 tablet by mouth daily.   PREVAGEN PO Take by mouth.   rosuvastatin  40 MG tablet Commonly known as: CRESTOR  TAKE 1 TABLET BY MOUTH EVERY DAY FOR CHOLESTEROL   Tiotropium Bromide-Olodaterol 2.5-2.5 MCG/ACT Aers Inhale 2 puffs into the lungs daily.        Allergies: No Known Allergies  Past Medical History, Surgical history, Social history, and Family History were reviewed and updated.  Review of Systems: All other 10 point review of systems is negative.   Physical Exam:  height is 5' 4.5 (1.638 m) (pended) and weight is 176 lb (79.8 kg) (pended). Her oral temperature is 98.2 F (36.8 C) (pended). Her blood pressure is 106/57 (abnormal, pended) and her pulse is 65 (pended). Her respiration is 18 (pended) and oxygen saturation is 96% (pended).   Wt Readings from Last 3 Encounters:  08/19/24 (  P) 176 lb (79.8 kg)  08/13/24 174 lb 6.4 oz (79.1 kg)  08/01/24 173 lb 6.4 oz (78.7 kg)   Constitutional: NAD, no slurred speech  Ocular: Sclerae unicteric, pupils equal, round and reactive to light Ear-nose-throat: Oropharynx clear, dentition fair Lymphatic: No cervical or supraclavicular adenopathy Lungs no rales or rhonchi, good excursion bilaterally Heart regular rate and rhythm, no murmur appreciated Abd soft, nontender, positive bowel sounds MSK no focal spinal tenderness, no joint edema Neuro: non-focal, well-oriented, appropriate affect. Cranial nerves are intact. Normal gait.   Lab Results  Component Value Date   WBC 6.3 08/19/2024   HGB 10.0 (L) 08/19/2024   HCT 30.8 (L) 08/19/2024    MCV 82.8 08/19/2024   PLT 233 08/19/2024   Lab Results  Component Value Date   FERRITIN 82 07/03/2024   IRON 98 07/03/2024   TIBC 374 07/03/2024   UIBC 276 07/03/2024   IRONPCTSAT 26 07/03/2024   Lab Results  Component Value Date   RETICCTPCT 0.9 07/03/2024   RBC 3.72 (L) 08/19/2024   No results found for: KPAFRELGTCHN, LAMBDASER, KAPLAMBRATIO No results found for: IGGSERUM, IGA, IGMSERUM No results found for: STEPHANY CARLOTA BENSON MARKEL EARLA JOANNIE DOC VICK, SPEI   Chemistry      Component Value Date/Time   NA 137 08/19/2024 1418   K 4.3 08/19/2024 1418   CL 103 08/19/2024 1418   CO2 24 08/19/2024 1418   BUN 18 08/19/2024 1418   CREATININE 1.13 (H) 08/19/2024 1418   CREATININE 1.07 (H) 07/11/2024 1501      Component Value Date/Time   CALCIUM  9.7 08/19/2024 1418   ALKPHOS 51 08/19/2024 1418   AST 28 08/19/2024 1418   ALT 22 08/19/2024 1418   BILITOT 0.3 08/19/2024 1418     Encounter Diagnoses  Name Primary?   Erythropoietin  deficiency anemia Yes   Iron deficiency anemia, unspecified iron deficiency anemia type     Impression and Plan: Kim Grant is a very pleasant 83 yo caucasian female with erythropoietin  deficiency anemia. At times she has had iron deficiency. Most recently on 07/03/2024 her iron studies were normal.   ESA given due to her Hgb being 10.0 Iron studies pending.    Disposition ESA today RTC monthly for lab (CBC) and injection RTC 3 months APP, labs (CBC, CMP) and injection-Larned  Lauraine CHRISTELLA Dais, PA-C 10/28/20253:09 PM

## 2024-08-20 ENCOUNTER — Ambulatory Visit: Payer: Self-pay | Admitting: Medical Oncology

## 2024-08-20 ENCOUNTER — Ambulatory Visit

## 2024-08-20 VITALS — BP 116/64 | HR 68 | Temp 97.9°F | Ht 65.0 in | Wt 177.8 lb

## 2024-08-20 DIAGNOSIS — J449 Chronic obstructive pulmonary disease, unspecified: Secondary | ICD-10-CM | POA: Diagnosis not present

## 2024-08-20 DIAGNOSIS — Z87891 Personal history of nicotine dependence: Secondary | ICD-10-CM | POA: Diagnosis not present

## 2024-08-20 DIAGNOSIS — J432 Centrilobular emphysema: Secondary | ICD-10-CM

## 2024-08-20 DIAGNOSIS — J439 Emphysema, unspecified: Secondary | ICD-10-CM

## 2024-08-20 MED ORDER — TIOTROPIUM BROMIDE-OLODATEROL 2.5-2.5 MCG/ACT IN AERS: 2.0000 | INHALATION_SPRAY | Freq: Every day | RESPIRATORY_TRACT | 3 refills | Status: AC

## 2024-08-20 MED ORDER — ALBUTEROL SULFATE HFA 108 (90 BASE) MCG/ACT IN AERS: 2.0000 | INHALATION_SPRAY | Freq: Four times a day (QID) | RESPIRATORY_TRACT | 4 refills | Status: DC | PRN

## 2024-08-20 NOTE — Telephone Encounter (Signed)
 ATC patient, LVMTCB to update us  on if she rec'd her alternative inhaler

## 2024-08-20 NOTE — Patient Instructions (Signed)
 Dear Ms. Sayed Based on your smoking history, and prior pulmonary function test results, I think you have COPD.  I will recommend the follow: -Stiolto 2 puffs a day.  -Albuterol as need for shortness of breath -Pulmonary function test at the next appointment.  I will see you in 3 months.   INHALER INSTRUCTIONS: To use the inhaler you follow these steps: Prime the inhaler according to instructions (which means waste between 1-4 doses before next use).  Follow package insert Shake the inhaler before each use Exhale completely by blowing all the air out of your lungs Seal your mouth around the inhaler Press down on the canister then inhale SLOW and STEADY until your fill your lungs completely. Hold the breath for 6-10 seconds Gently exhale Wait 60 seconds then repeat steps 2-8.  Your pharmacist can also review proper technique if you have any remaining questions. Let me know if the cost is too high, your insurance may be able to recommend a more affordable option for you.

## 2024-08-20 NOTE — Progress Notes (Signed)
 New Patient Pulmonology Office Visit   Subjective:  Patient ID: Kim Grant, female    DOB: 05/26/1941  MRN: [id number]  Referred by: Sebastian Beverley NOVAK, MD  CC: No chief complaint on file.   HPI Kim Grant is a 83 y.o. female with history of smoking  (quitted 11 years ago, 1/2 ppd), bronchitis, AS, who came for evaluation of dyspnea on exertion. She recently was diagnosed with aortic stenosis with some dyspnea and dizziness.  She had a remote smoking history. Quitted at age 84 yo, 1/2ppd.  Interval History: -Patient reported to have dyspnea on exertion 4 to 5 months ago.  It is more pronounced when she walks uphill.  She is able to do her biking and the step for 15 minutes without difficulty.   -She had bronchitis 2 weeks ago and received Augmentin and azithromycin , prednisone with resolution of her symptoms.  She denied multiple flares requiring antibiotics in the past. -She has some occasional wheezing. -She does not use the albuterol rescue inhaler.   ROS as above  Allergies: Patient has no known allergies.  Current Outpatient Medications:    albuterol (VENTOLIN HFA) 108 (90 Base) MCG/ACT inhaler, Inhale 2 puffs into the lungs every 6 (six) hours as needed for wheezing or shortness of breath., Disp: , Rfl:    Apoaequorin (PREVAGEN PO), Take by mouth., Disp: , Rfl:    calcium  citrate-vitamin D  (CITRACAL+D) 315-200 MG-UNIT per tablet, Take 3 tablets by mouth daily., Disp: , Rfl:    citalopram  (CELEXA ) 40 MG tablet, TAKE 1 TABLET BY MOUTH EVERY DAY, Disp: 90 tablet, Rfl: 3   denosumab  (PROLIA ) 60 MG/ML SOSY injection, Inject 60 mg into the skin every 6 (six) months., Disp: , Rfl:    esomeprazole  (NEXIUM ) 40 MG capsule, Take 1 capsule (40 mg total) by mouth daily., Disp: 90 capsule, Rfl: 0   famotidine (PEPCID) 40 MG tablet, Take 40 mg by mouth daily., Disp: , Rfl:    fluticasone  (FLONASE ) 50 MCG/ACT nasal spray, Place 2 sprays into both nostrils daily., Disp: 16 g, Rfl: 6    latanoprost (XALATAN) 0.005 % ophthalmic solution, Place 1 drop into the right eye at bedtime., Disp: , Rfl:    meclizine  (ANTIVERT ) 25 MG tablet, Take 1 tablet (25 mg total) by mouth 3 (three) times daily as needed for dizziness., Disp: 15 tablet, Rfl: 0   melatonin 5 MG TABS, Take 5 mg by mouth at bedtime as needed., Disp: , Rfl:    Multiple Vitamin (MULTIVITAMIN WITH MINERALS) TABS tablet, Take 1 tablet by mouth daily., Disp: , Rfl:    rosuvastatin  (CRESTOR ) 40 MG tablet, TAKE 1 TABLET BY MOUTH EVERY DAY FOR CHOLESTEROL, Disp: 90 tablet, Rfl: 3   Tiotropium Bromide-Olodaterol 2.5-2.5 MCG/ACT AERS, Inhale 2 puffs into the lungs daily. 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 HPI:  Complains of stopped up Bilateral ear(s). EXAM: shows cerumen impaction. PLAN: Cerumen removed with various instruments (curettes, suction) giving subjective relief.  External canals and tympanic membranes are otherwise normal.  Return as needed.   Chronic bronchitis (HCC) 08/04/2014  Closed fracture of nasal bone with routine healing 11/02/2021   Last Assessment & Plan:  Formatting of this note might be different from the original. Nasal fracture. Fell with facial trauma about 3 weeks ago.  CT of the face is reviewed and shows minimally displaced nasal fracture.  Denies any nasal obstruction. EXAM shows mild deviation of the nasal dorsum.  Subtle.  Intranasal exam is unremarkable. PLAN: Reassured I think everything is going to be okay with   Depression    DJD (degenerative joint disease),  ankle and foot 01/10/2016   Fall 01/20/2017   Fatigue 02/04/2018   Fracture of surgical neck of right humerus 01/20/2017   Hyperlipemia    Mammographic microcalcification found on diagnostic imaging of breast 08/04/2014   Osteoporosis    Tear of left rotator cuff 07/26/2018   Formatting of this note might be different from the original. 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   Past Surgical History:  Procedure Laterality Date   ABDOMINAL HYSTERECTOMY     BREAST SURGERY     KNEE SURGERY Right    ROTATOR CUFF REPAIR     Family History  Problem Relation Age of Onset   Cancer Mother        ovarian    Heart disease Father    Social History   Socioeconomic History   Marital status: Widowed    Spouse name: Not on file   Number of children: Not on file   Years of education: Not on file   Highest education level: Not on file  Occupational History   Not on file  Tobacco Use   Smoking status: Former    Current packs/day: 0.00    Average packs/day: 0.5 packs/day for 15.0 years (7.5 ttl pk-yrs)    Types: Cigarettes    Start date: 02/28/1998    Quit date: 02/28/2013    Years since quitting: 11.4    Passive exposure: Never   Smokeless tobacco: Never  Vaping Use   Vaping status: Never Used  Substance and Sexual Activity   Alcohol use: No   Drug use: No   Sexual activity: Not Currently  Other Topics Concern   Not on file  Social History Narrative   Pt works part time at [employer] hospital in [place] x 18 years. She is widowed, has 2 grown sons. 2 grand daughters, 1 great granddaughter who is 1yo.    Social Drivers of Corporate Investment Banker Strain: Low Risk  (06/06/2024)   Overall Financial Resource Strain (CARDIA)    Difficulty of Paying Living Expenses: Not hard at all  Food Insecurity: No Food Insecurity (06/06/2024)   Hunger Vital Sign    Worried About Running Out of Food in the Last Year: Never true    Ran Out of  Food in the Last Year: Never true  Transportation Needs: No Transportation Needs (06/06/2024)   PRAPARE - Administrator, Civil Service (Medical): No    Lack of Transportation (Non-Medical): No  Physical Activity: Insufficiently Active (06/06/2024)   Exercise Vital Sign    Days of Exercise per Week: 2 days    Minutes of Exercise per Session: 60 min  Stress: No Stress Concern Present (06/06/2024)   [employer] of Occupational Health - Occupational Stress Questionnaire    Feeling of Stress: Not at all  Social Connections: Socially Isolated (06/06/2024)   Social Connection and Isolation Panel    Frequency of Communication with  Friends and Family: More than three times a week    Frequency of Social Gatherings with Friends and Family: More than three times a week    Attends Religious Services: Never    Database Administrator or Organizations: No    Attends Banker Meetings: Never    Marital Status: Widowed  Intimate Partner Violence: Not At Risk (06/06/2024)   Humiliation, Afraid, Rape, and Kick questionnaire    Fear of Current or Ex-Partner: No    Emotionally Abused: No    Physically Abused: No    Sexually Abused: No    Social hx: smoking  (quitted 11 years ago, 1/2 ppd)    Objective:  There were no vitals taken for this visit. Wt Readings from Last 3 Encounters:  08/20/24 177 lb 12.8 oz (80.6 kg)  08/19/24 (P) 176 lb (79.8 kg)  08/13/24 174 lb 6.4 oz (79.1 kg)   SpO2 Readings from Last 3 Encounters:  08/20/24 99%  08/19/24 (P) 96%  08/13/24 96%    Physical Exam Constitutional:      Appearance: Normal appearance.  HENT:     Mouth/Throat:     Mouth: Mucous membranes are dry.  Cardiovascular:     Rate and Rhythm: Normal rate and regular rhythm.  Pulmonary:     Effort: Pulmonary effort is normal.     Breath sounds: Normal breath sounds.  Musculoskeletal:        General: Normal range of motion.     Comments: No LE edema  Neurological:      General: No focal deficit present.     Mental Status: She is alert and oriented to person, place, and time.    Diagnostic Review:   CT chest wo con 07/24/2024 1. Mild emphysema and diffuse bilateral bronchial wall thickening. 2. Small hiatal hernia. 3. Aortic valve calcifications. Correlate for echocardiographic evidence of aortic valve dysfunction     Assessment & Plan:   Centrilobular emphysema 2.   COPD 3.   Former smoker 4.   AS  83 yo with dyspnea on exertion 4-5 months ago. History of COPD (on records spirometry has shown mild obstruction) without long term inhaler. She a recent bronchitis treated with antibiotics + prednisone with resolution of her symptoms.  Patient was started on Stiolto, but she has not started yet. If symptoms have not resolved she may need an evaluation of her AS.  Plan: -Start Stiolto -PFTs -Consideration for flutter valve if bronchitis continue to be a problem. -Pending cardiology evaluation for AS.  Return in about 3 months (around 11/20/2024), or schedule PFTs the same date of appt.SABRA Marny Patch, MD Pulmonary and Critical Care Medicine [facility]

## 2024-08-25 DIAGNOSIS — H6123 Impacted cerumen, bilateral: Secondary | ICD-10-CM | POA: Diagnosis not present

## 2024-09-04 DIAGNOSIS — K080 Exfoliation of teeth due to systemic causes: Secondary | ICD-10-CM | POA: Diagnosis not present

## 2024-09-10 DIAGNOSIS — K080 Exfoliation of teeth due to systemic causes: Secondary | ICD-10-CM | POA: Diagnosis not present

## 2024-09-11 ENCOUNTER — Telehealth: Payer: Self-pay

## 2024-09-11 NOTE — Telephone Encounter (Signed)
 Noted

## 2024-09-11 NOTE — Telephone Encounter (Signed)
 Copied from CRM [id number]. Topic: Clinical - Medication Question >> Sep 11, 2024 10:36 AM Franky GRADE wrote: Reason for CRM: Patient received a message to advise when she picked up the Tiotropium Bromide-Olodaterol 2.5-2.5 MCG/ACT AERS [[id number]] prescription, patient apologizes for the late response and states she has picked it up.

## 2024-09-22 ENCOUNTER — Inpatient Hospital Stay: Attending: Hematology & Oncology

## 2024-09-22 ENCOUNTER — Inpatient Hospital Stay

## 2024-09-22 VITALS — BP 121/83 | HR 78 | Temp 97.6°F | Resp 16

## 2024-09-22 DIAGNOSIS — D631 Anemia in chronic kidney disease: Secondary | ICD-10-CM | POA: Insufficient documentation

## 2024-09-22 DIAGNOSIS — N1831 Chronic kidney disease, stage 3a: Secondary | ICD-10-CM | POA: Insufficient documentation

## 2024-09-22 DIAGNOSIS — R9431 Abnormal electrocardiogram [ECG] [EKG]: Secondary | ICD-10-CM | POA: Diagnosis not present

## 2024-09-22 LAB — CMP (CANCER CENTER ONLY)
ALT: 24 U/L (ref 0–44)
AST: 32 U/L (ref 15–41)
Albumin: 4.3 g/dL (ref 3.5–5.0)
Alkaline Phosphatase: 58 U/L (ref 38–126)
Anion gap: 9 (ref 5–15)
BUN: 18 mg/dL (ref 8–23)
CO2: 27 mmol/L (ref 22–32)
Calcium: 9.2 mg/dL (ref 8.9–10.3)
Chloride: 101 mmol/L (ref 98–111)
Creatinine: 1.04 mg/dL — ABNORMAL HIGH (ref 0.44–1.00)
GFR, Estimated: 53 mL/min — ABNORMAL LOW (ref >60)
Glucose, Bld: 100 mg/dL — ABNORMAL HIGH (ref 70–99)
Potassium: 4.2 mmol/L (ref 3.5–5.1)
Sodium: 137 mmol/L (ref 135–145)
Total Bilirubin: 0.3 mg/dL (ref 0.0–1.2)
Total Protein: 6.7 g/dL (ref 6.5–8.1)

## 2024-09-22 LAB — CBC
HCT: 31.8 % — ABNORMAL LOW (ref 36.0–46.0)
Hemoglobin: 10.4 g/dL — ABNORMAL LOW (ref 12.0–15.0)
MCH: 27 pg (ref 26.0–34.0)
MCHC: 32.7 g/dL (ref 30.0–36.0)
MCV: 82.6 fL (ref 80.0–100.0)
Platelets: 245 K/uL (ref 150–400)
RBC: 3.85 MIL/uL — ABNORMAL LOW (ref 3.87–5.11)
RDW: 14.9 % (ref 11.5–15.5)
WBC: 10.5 K/uL (ref 4.0–10.5)
nRBC: 0 % (ref 0.0–0.2)

## 2024-09-22 MED ORDER — EPOETIN ALFA-EPBX 40000 UNIT/ML IJ SOLN
40000.0000 [IU] | Freq: Once | INTRAMUSCULAR | Status: AC
  Administered 2024-09-22: 40000 [IU] via SUBCUTANEOUS
  Filled 2024-09-22: qty 1

## 2024-09-22 NOTE — Patient Instructions (Signed)

## 2024-09-29 ENCOUNTER — Emergency Department (HOSPITAL_BASED_OUTPATIENT_CLINIC_OR_DEPARTMENT_OTHER)
Admission: EM | Admit: 2024-09-29 | Discharge: 2024-09-30 | Disposition: A | Attending: Emergency Medicine | Admitting: Emergency Medicine

## 2024-09-29 DIAGNOSIS — N390 Urinary tract infection, site not specified: Secondary | ICD-10-CM

## 2024-09-29 DIAGNOSIS — R509 Fever, unspecified: Secondary | ICD-10-CM

## 2024-09-29 DIAGNOSIS — R519 Headache, unspecified: Secondary | ICD-10-CM | POA: Diagnosis not present

## 2024-09-29 DIAGNOSIS — W1809XA Striking against other object with subsequent fall, initial encounter: Secondary | ICD-10-CM | POA: Diagnosis not present

## 2024-09-29 DIAGNOSIS — S0990XA Unspecified injury of head, initial encounter: Secondary | ICD-10-CM

## 2024-09-29 DIAGNOSIS — R0981 Nasal congestion: Secondary | ICD-10-CM | POA: Diagnosis not present

## 2024-09-29 DIAGNOSIS — R531 Weakness: Secondary | ICD-10-CM | POA: Diagnosis not present

## 2024-09-29 DIAGNOSIS — Y92000 Kitchen of unspecified non-institutional (private) residence as  the place of occurrence of the external cause: Secondary | ICD-10-CM | POA: Diagnosis not present

## 2024-09-29 DIAGNOSIS — R7309 Other abnormal glucose: Secondary | ICD-10-CM | POA: Diagnosis not present

## 2024-09-29 DIAGNOSIS — R059 Cough, unspecified: Secondary | ICD-10-CM | POA: Diagnosis not present

## 2024-09-29 LAB — CBG MONITORING, ED: Glucose-Capillary: 116 mg/dL — ABNORMAL HIGH (ref 70–99)

## 2024-09-29 NOTE — ED Triage Notes (Signed)
 Pt presents via EMS c/o headache, productive cough, and dizziness. Pt temp 100.0 on arrival. Pt reports productive cough with yellow sputum.

## 2024-09-30 ENCOUNTER — Emergency Department (HOSPITAL_BASED_OUTPATIENT_CLINIC_OR_DEPARTMENT_OTHER)

## 2024-09-30 ENCOUNTER — Encounter (HOSPITAL_BASED_OUTPATIENT_CLINIC_OR_DEPARTMENT_OTHER): Payer: Self-pay

## 2024-09-30 ENCOUNTER — Other Ambulatory Visit: Payer: Self-pay

## 2024-09-30 DIAGNOSIS — N1831 Chronic kidney disease, stage 3a: Secondary | ICD-10-CM | POA: Diagnosis not present

## 2024-09-30 LAB — CBC WITH DIFFERENTIAL/PLATELET
Abs Immature Granulocytes: 0.08 K/uL — ABNORMAL HIGH (ref 0.00–0.07)
Basophils Absolute: 0 K/uL (ref 0.0–0.1)
Basophils Relative: 0 %
Eosinophils Absolute: 0.1 K/uL (ref 0.0–0.5)
Eosinophils Relative: 0 %
HCT: 31.3 % — ABNORMAL LOW (ref 36.0–46.0)
Hemoglobin: 10.3 g/dL — ABNORMAL LOW (ref 12.0–15.0)
Immature Granulocytes: 1 %
Lymphocytes Relative: 4 %
Lymphs Abs: 0.8 K/uL (ref 0.7–4.0)
MCH: 27 pg (ref 26.0–34.0)
MCHC: 32.9 g/dL (ref 30.0–36.0)
MCV: 82.2 fL (ref 80.0–100.0)
Monocytes Absolute: 1.5 K/uL — ABNORMAL HIGH (ref 0.1–1.0)
Monocytes Relative: 8 %
Neutro Abs: 15.3 K/uL — ABNORMAL HIGH (ref 1.7–7.7)
Neutrophils Relative %: 87 %
Platelets: 249 K/uL (ref 150–400)
RBC: 3.81 MIL/uL — ABNORMAL LOW (ref 3.87–5.11)
RDW: 15.5 % (ref 11.5–15.5)
WBC: 17.7 K/uL — ABNORMAL HIGH (ref 4.0–10.5)
nRBC: 0 % (ref 0.0–0.2)

## 2024-09-30 LAB — COMPREHENSIVE METABOLIC PANEL WITH GFR
ALT: 18 U/L (ref 0–44)
AST: 26 U/L (ref 15–41)
Albumin: 4.2 g/dL (ref 3.5–5.0)
Alkaline Phosphatase: 53 U/L (ref 38–126)
Anion gap: 11 (ref 5–15)
BUN: 25 mg/dL — ABNORMAL HIGH (ref 8–23)
CO2: 24 mmol/L (ref 22–32)
Calcium: 9.3 mg/dL (ref 8.9–10.3)
Chloride: 100 mmol/L (ref 98–111)
Creatinine, Ser: 1.38 mg/dL — ABNORMAL HIGH (ref 0.44–1.00)
GFR, Estimated: 38 mL/min — ABNORMAL LOW (ref >60)
Glucose, Bld: 114 mg/dL — ABNORMAL HIGH (ref 70–99)
Potassium: 4.4 mmol/L (ref 3.5–5.1)
Sodium: 134 mmol/L — ABNORMAL LOW (ref 135–145)
Total Bilirubin: 0.4 mg/dL (ref 0.0–1.2)
Total Protein: 6.7 g/dL (ref 6.5–8.1)

## 2024-09-30 LAB — URINALYSIS, W/ REFLEX TO CULTURE (INFECTION SUSPECTED)
Bilirubin Urine: NEGATIVE
Glucose, UA: NEGATIVE mg/dL
Ketones, ur: NEGATIVE mg/dL
Nitrite: NEGATIVE
Protein, ur: NEGATIVE mg/dL
Specific Gravity, Urine: 1.015 (ref 1.005–1.030)
WBC, UA: >50 WBC/hpf (ref 0–5)
pH: 6 (ref 5.0–8.0)

## 2024-09-30 LAB — RESP PANEL BY RT-PCR (RSV, FLU A&B, COVID)  RVPGX2
Influenza A by PCR: NEGATIVE
Influenza B by PCR: NEGATIVE
Resp Syncytial Virus by PCR: NEGATIVE
SARS Coronavirus 2 by RT PCR: NEGATIVE

## 2024-09-30 LAB — LACTIC ACID, PLASMA: Lactic Acid, Venous: 1.2 mmol/L (ref 0.5–1.9)

## 2024-09-30 MED ORDER — HYDROCODONE-ACETAMINOPHEN 5-325 MG PO TABS
2.0000 | ORAL_TABLET | Freq: Once | ORAL | Status: AC
  Administered 2024-09-30: 2 via ORAL
  Filled 2024-09-30: qty 2

## 2024-09-30 MED ORDER — SODIUM CHLORIDE 0.9 % IV BOLUS
1000.0000 mL | Freq: Once | INTRAVENOUS | Status: AC
  Administered 2024-09-30: 1000 mL via INTRAVENOUS

## 2024-09-30 MED ORDER — CEPHALEXIN 500 MG PO CAPS: 500.0000 mg | ORAL_CAPSULE | Freq: Three times a day (TID) | ORAL | 0 refills | Status: DC

## 2024-09-30 MED ORDER — SODIUM CHLORIDE 0.9 % IV SOLN
1.0000 g | Freq: Once | INTRAVENOUS | Status: AC
  Administered 2024-09-30: 1 g via INTRAVENOUS
  Filled 2024-09-30: qty 10

## 2024-09-30 NOTE — ED Provider Notes (Addendum)
 [facility] Provider Note   CSN: [id number] Arrival date & time: 09/29/24  2351     Patient presents with: Headache and Fever   Kim Grant is a 83 y.o. female.   Patient is an 83 year old female with past medical history of hyperlipidemia, aortic valve syndrome, osteoporosis.  Patient presenting today with complaints of headache.  She reports falling in her kitchen 1 week ago and striking her head on the floor.  She experienced no loss of consciousness, but has had worsening headache throughout the week.  She denies any weakness or numbness.  No visual disturbances.  Patient arrives here with a temperature of 100 and feels fatigued.  She also describes a several week history of congestion and productive cough.       Prior to Admission medications   Medication Sig Start Date End Date Taking? Authorizing Provider  albuterol  (VENTOLIN  HFA) 108 (90 Base) MCG/ACT inhaler Inhale 2 puffs into the lungs every 6 (six) hours as needed for wheezing or shortness of breath. 08/20/24   Adrien Winfred Berke, MD  Apoaequorin (PREVAGEN PO) Take by mouth.    [provider]  calcium  citrate-vitamin D  (CITRACAL+D) 315-200 MG-UNIT per tablet Take 3 tablets by mouth daily.    [provider]  citalopram  (CELEXA ) 40 MG tablet TAKE 1 TABLET BY MOUTH EVERY DAY 12/24/23   Sebastian Beverley NOVAK, MD  denosumab  (PROLIA ) 60 MG/ML SOSY injection Inject 60 mg into the skin every 6 (six) months.    [provider]  esomeprazole  (NEXIUM ) 40 MG capsule Take 1 capsule (40 mg total) by mouth daily. 07/11/24   Sebastian Beverley NOVAK, MD  famotidine (PEPCID) 40 MG tablet Take 40 mg by mouth daily.    [provider]  fluticasone  (FLONASE ) 50 MCG/ACT nasal spray Place 2 sprays into both nostrils daily. 12/26/23   Sebastian Beverley NOVAK, MD  latanoprost (XALATAN) 0.005 % ophthalmic solution Place 1 drop into the right eye at bedtime. 04/29/24   [provider]  meclizine  (ANTIVERT ) 25 MG tablet Take 1 tablet (25 mg total) by mouth 3 (three) times daily as needed for dizziness. 04/04/14   Geroldine Berg, MD  melatonin 5 MG TABS Take 5 mg by mouth at bedtime as needed.    [provider]  Multiple Vitamin (MULTIVITAMIN WITH MINERALS) TABS tablet Take 1 tablet by mouth daily.    [provider]  rosuvastatin  (CRESTOR ) 40 MG tablet TAKE 1 TABLET BY MOUTH EVERY DAY FOR CHOLESTEROL 12/24/23   Sebastian Beverley NOVAK, MD    Allergies: Patient has no known allergies.    Review of Systems  All other systems reviewed and are negative.   Updated Vital Signs BP 117/66   Pulse 84   Temp 100 F (37.8 C) (Oral)   Resp (!) 25   SpO2 93%   Physical Exam Vitals and nursing note reviewed.  Constitutional:      General: She is not in acute distress.    Appearance: She is well-developed. She is not diaphoretic.  HENT:     Head: Normocephalic and atraumatic.  Eyes:     Extraocular Movements: Extraocular movements intact.     Pupils: Pupils are equal, round, and reactive to light.  Cardiovascular:     Rate and Rhythm: Normal rate and regular rhythm.     Heart sounds: No murmur heard.    No friction rub. No gallop.  Pulmonary:     Effort: Pulmonary effort is normal.  No respiratory distress.     Breath sounds: Normal breath sounds. No wheezing.  Abdominal:     General: Bowel sounds are normal. There is no distension.     Palpations: Abdomen is soft.     Tenderness: There is no abdominal tenderness.  Musculoskeletal:        General: Normal range of motion.     Cervical back: Normal range of motion and neck supple. No rigidity.  Lymphadenopathy:     Cervical: No cervical adenopathy.  Skin:    General: Skin is warm and dry.  Neurological:     General: No focal deficit present.     Mental Status: She is alert and oriented to person, place, and time.     Cranial Nerves: No cranial nerve deficit or facial asymmetry.     Motor:  No weakness.     Coordination: Coordination normal.     (all labs ordered are listed, but only abnormal results are displayed) Labs Reviewed  CBC WITH DIFFERENTIAL/PLATELET - Abnormal; Notable for the following components:      Result Value   WBC 17.7 (*)    RBC 3.81 (*)    Hemoglobin 10.3 (*)    HCT 31.3 (*)    Neutro Abs 15.3 (*)    Monocytes Absolute 1.5 (*)    Abs Immature Granulocytes 0.08 (*)    All other components within normal limits  CBG MONITORING, ED - Abnormal; Notable for the following components:   Glucose-Capillary 116 (*)    All other components within normal limits  CULTURE, BLOOD (ROUTINE X 2)  CULTURE, BLOOD (ROUTINE X 2)  RESP PANEL BY RT-PCR (RSV, FLU A&B, COVID)  [id number]  COMPREHENSIVE METABOLIC PANEL WITH GFR  LACTIC ACID, PLASMA  LACTIC ACID, PLASMA  URINALYSIS, W/ REFLEX TO CULTURE (INFECTION SUSPECTED)  URINALYSIS, ROUTINE W REFLEX MICROSCOPIC    EKG: None  Radiology: DG Chest Port 1 View if patient is in a treatment room. Result Date: 09/30/2024 EXAM: 1 VIEW(S) XRAY OF THE CHEST 09/30/2024 12:13:00 AM COMPARISON: 07/28/2024 CLINICAL HISTORY: Suspected Sepsis FINDINGS: LUNGS AND PLEURA: No focal pulmonary opacity. No pleural effusion. No pneumothorax. HEART AND MEDIASTINUM: Atherosclerotic calcifications. No acute abnormality of the cardiac and mediastinal silhouettes. 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 Degenerative changes of bilateral shoulders. No acute osseous abnormality. IMPRESSION: 1. No acute cardiopulmonary process. 2. Atherosclerotic calcifications. 3. 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 4. Degenerative changes of the bilateral shoulders. Electronically signed by: Dorethia Molt MD 09/30/2024 12:18 AM EST RP Workstation: [id number]     Procedures   Medications Ordered in the ED - No data to display                                  Medical Decision Making Amount and/or  Complexity of Data Reviewed Labs: ordered. Radiology: ordered.  Risk Prescription drug management.   Patient is an 83 year old female with past medical history as per HPI presenting with complaints of headache.  She was also found to be febrile upon presentation.  She also reports falling 1 week ago and striking her head.  Vital signs are otherwise stable and patient is clinically well-appearing.  She has full range of motion of her neck with no meningismus.  Laboratory studies obtained including CBC, CMP, lactate, urinalysis, and respiratory panel.  White count is 17.7 and urinalysis is consistent with UTI, otherwise laboratory studies  are unremarkable.  Chest x-ray showing no acute process and CT scan of the head showing no acute traumatic injury.  Patient was given IV fluids along with Rocephin  for her UTI.  She was also given Norco for her headache and is now feeling markedly improved.  Suspect her symptoms are likely infectious and related to UTI.  She has no meningismus and I doubt meningitis.  I did discuss admission for IV antibiotics and observation, however patient adamantly declines.  She will be discharged with Keflex  and as needed return.  Her son is here to pick her up and is comfortable with the disposition.  She has multiple family members in the area and neighbors that live close by.     Final diagnoses:  None    ED Discharge Orders     None          Geroldine Berg, MD 09/30/24 [id number]    Geroldine Berg, MD 09/30/24 [phone]

## 2024-09-30 NOTE — Discharge Instructions (Signed)
 Begin taking Keflex  as prescribed.  Take ibuprofen 600 mg every 6 hours as needed for pain.  Drink plenty of fluids and get plenty of rest.  Return to the emergency department if you develop high fever, worsening pain, difficulty breathing, or for other new and concerning symptoms.

## 2024-10-01 ENCOUNTER — Telehealth: Payer: Self-pay | Admitting: Family Medicine

## 2024-10-01 NOTE — Telephone Encounter (Signed)
 Copied from CRM #[id number]. Topic: Referral - Request for Referral >> Oct 01, 2024  3:39 PM Eva FALCON wrote: Did the patient discuss referral with their provider in the last year? No, did not want to schedule, states she is going to cancer center and Dr. Sebastian can see everything.  (If No - schedule appointment) (If Yes - send message)  Appointment offered? Yes  Type of order/referral and detailed reason for visit: Nephrology.   Preference of office, provider, location: [facility] in [place], [place] or unless Dr. Sebastian has a suggestion of where to go.    If referral order, have you been seen by this specialty before? No (If Yes, this issue or another issue? When? Where?  Can we respond through MyChart? Yes, but would prefer phone call.

## 2024-10-02 LAB — URINE CULTURE: Culture: >=100000 — AB

## 2024-10-03 ENCOUNTER — Telehealth (HOSPITAL_BASED_OUTPATIENT_CLINIC_OR_DEPARTMENT_OTHER): Payer: Self-pay | Admitting: *Deleted

## 2024-10-03 NOTE — Progress Notes (Signed)
 ED Antimicrobial Stewardship Positive Culture Follow Up   Joelly Bolanos is an 83 y.o. female who presented to [facility] on 09/29/2024 with a chief complaint of  Chief Complaint  Patient presents with   Headache   Fever    Recent Results (from the past 720 hours)  Culture, blood (Routine x 2)     Status: None (Preliminary result)   Collection Time: 09/30/24 12:00 AM   Specimen: BLOOD  Result Value Ref Range Status   Specimen Description   Final    BLOOD RIGHT ANTECUBITAL Performed at [facility], [address] [place], [place] [address]    Special Requests   Final    BOTTLES DRAWN AEROBIC AND ANAEROBIC Blood Culture adequate volume Performed at [facility], [address], [place], [place] [address]    Culture   Final    NO GROWTH 3 DAYS Performed at [facility], [address], [place], [place] [address]    Report Status PENDING  Incomplete  Culture, blood (Routine x 2)     Status: None (Preliminary result)   Collection Time: 09/30/24 12:00 AM   Specimen: BLOOD  Result Value Ref Range Status   Specimen Description   Final    BLOOD LEFT ANTECUBITAL Performed at [facility], [address] [place], [place] [address]    Special Requests   Final    BOTTLES DRAWN AEROBIC AND ANAEROBIC Blood Culture adequate volume Performed at [facility], [address] [place], [place] [address]    Culture   Final    NO GROWTH 3 DAYS Performed at [facility], [address], [place], [place] [address]    Report Status PENDING  Incomplete  Resp panel by RT-PCR (RSV, Flu A&B, Covid) Anterior Nasal Swab     Status: None   Collection Time: 09/30/24 12:06 AM   Specimen: Anterior Nasal Swab  Result Value Ref Range Status   SARS Coronavirus 2 by RT PCR NEGATIVE NEGATIVE Final    Comment: (NOTE) SARS-CoV-2 target nucleic acids are NOT DETECTED.  The SARS-CoV-2 RNA is generally detectable in upper respiratory specimens during the  acute phase of infection. The lowest concentration of SARS-CoV-2 viral copies this assay can detect is 138 copies/mL. A negative result does not preclude SARS-Cov-2 infection and should not be used as the sole basis for treatment or other patient management decisions. A negative result may occur with  improper specimen collection/handling, submission of specimen other than nasopharyngeal swab, presence of viral mutation(s) within the areas targeted by this assay, and inadequate number of viral copies(<138 copies/mL). A negative result must be combined with clinical observations, patient history, and epidemiological information. The expected result is Negative.  Fact Sheet for Patients:  bloggercourse.com  Fact Sheet for Healthcare Providers:  seriousbroker.it  This test is no t yet approved or cleared by the [place]  FDA and  has been authorized for detection and/or diagnosis of SARS-CoV-2 by FDA under an Emergency Use Authorization (EUA). This EUA will remain  in effect (meaning this test can be used) for the duration of the COVID-19 declaration under Section 564(b)(1) of the Act, 21 U.S.C.section 360bbb-3(b)(1), unless the authorization is terminated  or revoked sooner.       Influenza A by PCR NEGATIVE NEGATIVE Final   Influenza B by PCR NEGATIVE NEGATIVE Final    Comment: (NOTE) The Xpert Xpress SARS-CoV-2/FLU/RSV plus assay is intended as an aid  in the diagnosis of influenza from Nasopharyngeal swab specimens and should not be used as a sole basis for treatment. Nasal washings and aspirates are unacceptable for Xpert Xpress SARS-CoV-2/FLU/RSV testing.  Fact Sheet for Patients: bloggercourse.com  Fact Sheet for Healthcare Providers: seriousbroker.it  This test is not yet approved or cleared by the [place]  FDA and has been authorized for detection and/or  diagnosis of SARS-CoV-2 by FDA under an Emergency Use Authorization (EUA). This EUA will remain in effect (meaning this test can be used) for the duration of the COVID-19 declaration under Section 564(b)(1) of the Act, 21 U.S.C. section 360bbb-3(b)(1), unless the authorization is terminated or revoked.     Resp Syncytial Virus by PCR NEGATIVE NEGATIVE Final    Comment: (NOTE) Fact Sheet for Patients: bloggercourse.com  Fact Sheet for Healthcare Providers: seriousbroker.it  This test is not yet approved or cleared by the [place]  FDA and has been authorized for detection and/or diagnosis of SARS-CoV-2 by FDA under an Emergency Use Authorization (EUA). This EUA will remain in effect (meaning this test can be used) for the duration of the COVID-19 declaration under Section 564(b)(1) of the Act, 21 U.S.C. section 360bbb-3(b)(1), unless the authorization is terminated or revoked.  Performed at [facility], [address] [place], [place] [address]   Urine Culture     Status: Abnormal   Collection Time: 09/30/24 12:06 AM   Specimen: Urine, Random  Result Value Ref Range Status   Specimen Description   Final    URINE, RANDOM Performed at [facility], [address] [place], [place] [address]    Special Requests   Final    NONE Reflexed from [id number] Performed at [facility], [address] [place], [place] [address]    Culture >=100,000 COLONIES/mL ENTEROCOCCUS FAECALIS (A)  Final   Report Status 10/02/2024 FINAL  Final   Organism ID, Bacteria ENTEROCOCCUS FAECALIS (A)  Final      Susceptibility   Enterococcus faecalis - MIC*    AMPICILLIN <=2 SENSITIVE Sensitive     NITROFURANTOIN <=16 SENSITIVE Sensitive     VANCOMYCIN  1 SENSITIVE Sensitive     * >=100,000 COLONIES/mL ENTEROCOCCUS FAECALIS    [x]  Treated with cephalexin , organism resistant to prescribed antimicrobial []  Patient  discharged originally without antimicrobial agent and treatment is now indicated  New antibiotic prescription: amoxicillin  500mg  po BID x 10 days  ED Provider: Sherlean Carota, PA-C   Vito Ralph, PharmD, BCPS 10/03/2024, 8:17 AM Clinical Pharmacist Monday - Friday phone -  [phone] Saturday - Sunday phone - [phone]

## 2024-10-03 NOTE — Telephone Encounter (Signed)
 Post ED Visit - Positive Culture Follow-up: Unsuccessful Patient Follow-up  Culture assessed and recommendations reviewed by:  [x]  Vito Ralph, Pharm.D. []  Venetia Gully, Pharm.D., BCPS AQ-ID []  Garrel Crews, Pharm.D., BCPS []  Almarie Lunger, Pharm.D., BCPS []  [place], [place].D., BCPS, AAHIVP []  Rosaline Bihari, Pharm.D., BCPS, AAHIVP []  Massie Rigg, PharmD []  Jodie Rower, PharmD, BCPS  Positive urine culture  []  Patient discharged without antimicrobial prescription and treatment is now indicated [x]  Organism is resistant to prescribed ED discharge antimicrobial []  Patient with positive blood cultures  Plan: Stop cephalexin ; start amoxicillin  500 mg po BID x 10 days per Sherlean Carota, PA-C Unable to contact patient after 3 attempts, letter will be sent to address on file  Lorita Barnie Pereyra 10/03/2024, 4:27 PM

## 2024-10-05 LAB — CULTURE, BLOOD (ROUTINE X 2)
Culture: NO GROWTH
Culture: NO GROWTH
Special Requests: ADEQUATE
Special Requests: ADEQUATE

## 2024-10-06 ENCOUNTER — Telehealth (HOSPITAL_BASED_OUTPATIENT_CLINIC_OR_DEPARTMENT_OTHER): Payer: Self-pay | Admitting: *Deleted

## 2024-10-06 ENCOUNTER — Telehealth: Payer: Self-pay

## 2024-10-06 NOTE — Telephone Encounter (Signed)
 Patient was seen on 10/22 and her insurance will not cover for her appt. Is there anything you can do ?

## 2024-10-06 NOTE — Telephone Encounter (Signed)
 Copied from CRM [id number]. Topic: General - Other >> Oct 06, 2024 12:28 PM Aisha D wrote: Reason for CRM: Pt stated that she was seen on 10/22 for a 1 month fu for a cough. Pt stated that her insurance stated that they are unable to cover the appt due to the diagnostic code used (09249). Pt would like to this to be corrected and would like a callback with an update.

## 2024-10-06 NOTE — Telephone Encounter (Signed)
 Post ED Visit - Positive Culture Follow-up: Successful Patient Follow-Up  Culture assessed and recommendations reviewed by:  [x]  Vito Bunde.D. []  Venetia Gully, Pharm.D., BCPS AQ-ID []  Garrel Crews, Pharm.D., BCPS []  Almarie Lunger, [address].D., BCPS []  [place], [address].D., BCPS, AAHIVP []  Rosaline Bihari, Pharm.D., BCPS, AAHIVP []  Vernell Meier, PharmD, BCPS []  Latanya Hint, PharmD, BCPS []  Donald Medley, PharmD, BCPS []  Rocky Bold, PharmD  Positive urine culture  []  Patient discharged without antimicrobial prescription and treatment is now indicated [x]  Organism is resistant to prescribed ED discharge antimicrobial []  Patient with positive blood cultures  Changes discussed with ED provider: Sherlean Carota PA-C New antibiotic prescription Amoxicillin  500 mg BID x 10  days Called to [facility]   Contacted patient, date 10/06/24, time 1115   Kim Grant 10/06/2024, 11:17 AM

## 2024-10-13 NOTE — Telephone Encounter (Signed)
 Contacted patient and informed her of the balance write off. Nothing further needed.

## 2024-10-15 ENCOUNTER — Other Ambulatory Visit: Payer: Self-pay | Admitting: Medical Oncology

## 2024-10-15 DIAGNOSIS — D631 Anemia in chronic kidney disease: Secondary | ICD-10-CM

## 2024-10-20 ENCOUNTER — Inpatient Hospital Stay

## 2024-10-20 VITALS — BP 128/69 | HR 70 | Temp 97.8°F | Resp 18

## 2024-10-20 DIAGNOSIS — N1831 Chronic kidney disease, stage 3a: Secondary | ICD-10-CM | POA: Diagnosis not present

## 2024-10-20 DIAGNOSIS — D631 Anemia in chronic kidney disease: Secondary | ICD-10-CM

## 2024-10-20 LAB — CMP (CANCER CENTER ONLY)
ALT: 21 U/L (ref 0–44)
AST: 30 U/L (ref 15–41)
Albumin: 4.2 g/dL (ref 3.5–5.0)
Alkaline Phosphatase: 51 U/L (ref 38–126)
Anion gap: 10 (ref 5–15)
BUN: 18 mg/dL (ref 8–23)
CO2: 26 mmol/L (ref 22–32)
Calcium: 9.7 mg/dL (ref 8.9–10.3)
Chloride: 98 mmol/L (ref 98–111)
Creatinine: 1.17 mg/dL — ABNORMAL HIGH (ref 0.44–1.00)
GFR, Estimated: 46 mL/min — ABNORMAL LOW
Glucose, Bld: 91 mg/dL (ref 70–99)
Potassium: 5 mmol/L (ref 3.5–5.1)
Sodium: 133 mmol/L — ABNORMAL LOW (ref 135–145)
Total Bilirubin: 0.2 mg/dL (ref 0.0–1.2)
Total Protein: 6.6 g/dL (ref 6.5–8.1)

## 2024-10-20 LAB — CBC
HCT: 31.7 % — ABNORMAL LOW (ref 36.0–46.0)
Hemoglobin: 10.2 g/dL — ABNORMAL LOW (ref 12.0–15.0)
MCH: 26 pg (ref 26.0–34.0)
MCHC: 32.2 g/dL (ref 30.0–36.0)
MCV: 80.9 fL (ref 80.0–100.0)
Platelets: 320 K/uL (ref 150–400)
RBC: 3.92 MIL/uL (ref 3.87–5.11)
RDW: 15.3 % (ref 11.5–15.5)
WBC: 6.3 K/uL (ref 4.0–10.5)
nRBC: 0 % (ref 0.0–0.2)

## 2024-10-20 MED ORDER — EPOETIN ALFA-EPBX 40000 UNIT/ML IJ SOLN
40000.0000 [IU] | Freq: Once | INTRAMUSCULAR | Status: AC
  Administered 2024-10-20: 40000 [IU] via SUBCUTANEOUS
  Filled 2024-10-20: qty 1

## 2024-10-20 NOTE — Patient Instructions (Signed)

## 2024-10-27 ENCOUNTER — Other Ambulatory Visit: Payer: Self-pay | Admitting: Family Medicine

## 2024-10-27 DIAGNOSIS — R053 Chronic cough: Secondary | ICD-10-CM

## 2024-11-13 ENCOUNTER — Ambulatory Visit: Admitting: Family Medicine

## 2024-11-13 VITALS — BP 133/64 | HR 74 | Temp 97.9°F | Ht 65.0 in | Wt 176.8 lb

## 2024-11-13 DIAGNOSIS — D631 Anemia in chronic kidney disease: Secondary | ICD-10-CM | POA: Diagnosis not present

## 2024-11-13 DIAGNOSIS — N1831 Chronic kidney disease, stage 3a: Secondary | ICD-10-CM

## 2024-11-13 DIAGNOSIS — J301 Allergic rhinitis due to pollen: Secondary | ICD-10-CM

## 2024-11-13 DIAGNOSIS — J432 Centrilobular emphysema: Secondary | ICD-10-CM | POA: Diagnosis not present

## 2024-11-13 DIAGNOSIS — I350 Nonrheumatic aortic (valve) stenosis: Secondary | ICD-10-CM | POA: Diagnosis not present

## 2024-11-13 DIAGNOSIS — K2270 Barrett's esophagus without dysplasia: Secondary | ICD-10-CM

## 2024-11-13 NOTE — Patient Instructions (Addendum)
 It was very nice to see you today!  VISIT SUMMARY: Today, we discussed your chronic cough, wheezing, shortness of breath, GERD, allergic bronchitis, aortic stenosis, and chronic anemia. We reviewed your current treatments and made some recommendations to help manage your symptoms.  YOUR PLAN: CENTRILOBULAR EMPHYSEMA: Your chronic cough and wheezing are likely due to centrilobular emphysema, which worsens with cold weather and exercise. -Continue using your albuterol  inhaler as needed, especially before exercise. -Use Flonase  regularly to manage your symptoms. -Wear a scarf over your mouth in cold weather to prevent exacerbation.  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 -Continue taking esomeprazole  40 mg daily in the morning. -Continue taking famotidine 40 mg at bedtime.  ALLERGIC BRONCHITIS: Your symptoms have improved with Flonase . -Continue using Flonase , two sprays in each nostril daily.  AORTIC STENOSIS: Your aortic stenosis may be contributing to your shortness of breath. You have no chest pain or palpitations. -You are referred to cardiology for evaluation of your aortic stenosis and potential valvular disorder. -We will consider a follow-up echocardiogram if indicated by cardiology.  CHRONIC ANEMIA: Your hemoglobin levels are stable around 10.2-10.3, and you are managing it with erythropoietin  therapy. -Continue your erythropoietin  therapy.  Return in about 3 months (around 02/11/2025).   Take care, Arvella Hummer, MD, MS   PLEASE NOTE:  If you had any lab tests, please let us  know if you have not heard back within a few days. You may see your results on mychart before we have a chance to review them but we will give you a call once they are reviewed by us .   If we ordered any referrals today, please let us  know if you have not heard from their office within the next week.   If you had any  urgent prescriptions sent in today, please check with the pharmacy within an hour of our visit to make sure the prescription was transmitted appropriately.   Please try these tips to maintain a healthy lifestyle:  Eat at least 3 REAL meals and 1-2 snacks per day.  Aim for no more than 5 hours between eating.  If you eat breakfast, please do so within one hour of getting up.   Each meal should contain half fruits/vegetables, one quarter protein, and one quarter carbs (no bigger than a computer mouse)  Cut down on sweet beverages. This includes juice, soda, and sweet tea.   Drink at least 1 glass of water with each meal and aim for at least 8 glasses per day  Exercise at least 150 minutes every week.

## 2024-11-13 NOTE — Progress Notes (Signed)
 " Assessment & Plan   Assessment/Plan:     Assessment and Plan Assessment & Plan Centrilobular emphysema Chronic cough and wheezing, exacerbated by cold weather and exercise. Oxygen saturation drops during exertion. Improvement with Stiolto and albuterol  inhaler. No significant wheezing on examination.  Likely multifactorial with additional elements of upper respiratory airway cough secondary to rhinitis and uncontrolled reflux disease - Continue albuterol  inhaler as needed, especially before exercise. -Continue to follow-up with pulmonology - Use Flonase  regularly to manage symptoms. - Advised wearing a scarf over the mouth in cold weather to prevent exacerbation.  Barrett's esophagus with gastroesophageal reflux disease GERD managed with esomeprazole  and famotidine. No new symptoms reported. - Continue esomeprazole  40 mg daily in the morning. - Continue famotidine 40 mg at bedtime.  Allergic rhinitis due to pollen Symptoms improved with Flonase .  Seems to also help improve cough. - Continue Flonase  two sprays in each nostril daily.  Aortic stenosis Potentially contributing to shortness of breath. No recent cardiology follow-up. Last echocardiogram in October. No chest pain or palpitations reported. - Referred to cardiology for evaluation of aortic stenosis and potential valvular disorder. - Will consider follow-up echocardiogram if indicated by cardiology.  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 Managed with erythropoietin  therapy.  Anemia may be contributing to some of her shortness of breath.  CKD stable with GFR 46 - Continue erythropoietin  therapy. - Continue to monitor renal function and hemoglobin/hematocrit        There are no discontinued medications.  Return in about 3 months (around 02/11/2025) for cough.        Subjective:   Encounter date: 11/13/2024  Kim Grant is a 84 y.o. 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   She presents with chief complaint of Medical Management of Chronic Issues (Pt presents today for a chronic cough. Pt states she has been having her cough still. States the flonase  got worse when she stopped the flonase . Pt state she has started back the flonase  and it helps a lot ) .   Discussed the use of AI scribe software for clinical note transcription with the patient, who gave verbal consent to proceed.  History of  Present Illness Kim Grant is an 84 year old female with chronic cough who presents for follow-up.  Chronic cough and wheezing - Chronic cough persists despite use of fluticasone  nasal spray (Flonase ), though significant improvement since  resuming therapy after experiencing choking sensations - Wheezing remains present  Dyspnea and exercise tolerance - Shortness of breath, particularly with exertion such as walking uphill or exercising - Uses albuterol  inhaler, two puffs as needed, with symptomatic relief - Oxygen saturation measured at 97% during ambulation - Exercises regularly, including leg presses, cycling, and step machine - Legs feel tired but not sore during exercise - Uses inhaler first thing in the morning before exercise  Gastroesophageal reflux disease (gerd) - GERD managed with esomeprazole  40 mg daily in the morning and famotidine 40 mg at bedtime - No new or worsening GERD symptoms  Allergic bronchiolitis - Managed with fluticasone  nasal spray, two sprays in each nostril daily - Improvement in symptoms since resuming Flonase   Fatigue and anemia - Experiences fatigue, especially in the afternoons - Attributes fatigue to anemia, with stable hemoglobin levels around 10.2 to 10.3  Aortic stenosis - Aortic stenosis noted to be worsening per cardiology evaluation - No chest pain or palpitations  Recent respiratory infection - Severe episode of bronchitis in October     ROS  Past Surgical History:  Procedure Laterality Date   ABDOMINAL HYSTERECTOMY     BREAST SURGERY     KNEE SURGERY Right    ROTATOR CUFF REPAIR      Outpatient Medications Prior to Visit  Medication Sig Dispense Refill   albuterol  (VENTOLIN  HFA) 108 (90 Base) MCG/ACT inhaler Inhale 2 puffs into the lungs every 6 (six) hours as needed for wheezing or shortness of breath. 1 g 4   Apoaequorin (PREVAGEN PO) Take by mouth.     calcium  citrate-vitamin D  (CITRACAL+D) 315-200 MG-UNIT per tablet Take  3 tablets by mouth daily.     cephALEXin  (KEFLEX ) 500 MG capsule Take 1 capsule (500 mg total) by mouth 3 (three) times daily. 21 capsule 0   citalopram  (CELEXA ) 40 MG tablet TAKE 1 TABLET BY MOUTH EVERY DAY 90 tablet 3   denosumab  (PROLIA ) 60 MG/ML SOSY injection Inject 60 mg into the skin every 6 (six) months.     esomeprazole  (NEXIUM ) 40 MG capsule TAKE 1 CAPSULE BY MOUTH DAILY 90 capsule 0   famotidine (PEPCID) 40 MG tablet Take 40 mg by mouth daily.     fluticasone  (FLONASE ) 50 MCG/ACT nasal spray Place 2 sprays into both nostrils daily. 16 g 6   latanoprost (XALATAN) 0.005 % ophthalmic solution Place 1 drop into the right eye at bedtime.     meclizine  (ANTIVERT ) 25 MG tablet Take 1 tablet (25 mg total) by mouth 3 (three) times daily as needed for dizziness. 15 tablet 0   melatonin 5 MG TABS Take 5 mg by mouth at bedtime as needed.     Multiple Vitamin (MULTIVITAMIN WITH MINERALS) TABS tablet Take 1 tablet by mouth daily.     rosuvastatin  (CRESTOR ) 40 MG tablet TAKE 1 TABLET BY MOUTH EVERY DAY FOR CHOLESTEROL 90 tablet 3   No facility-administered medications prior to visit.    Family History  Problem Relation Age of Onset   Cancer Mother        ovarian    Heart disease Father     Social History   Socioeconomic History   Marital status: Widowed    Spouse name: Not on file   Number of children: Not on file   Years of education: Not on file   Highest education level: Not on file  Occupational History   Not on file  Tobacco Use   Smoking status: Former  Current packs/day: 0.00    Average packs/day: 0.5 packs/day for 15.0 years (7.5 ttl pk-yrs)    Types: Cigarettes    Start date: 02/28/1998    Quit date: 02/28/2013    Years since quitting: 11.7    Passive exposure: Never   Smokeless tobacco: Never  Vaping Use   Vaping status: Never Used  Substance and Sexual Activity   Alcohol use: No   Drug use: No   Sexual activity: Not Currently  Other Topics Concern   Not on file   Social History Narrative   Pt works part time at [employer] hospital in [place] x 18 years. She is widowed, has 2 grown sons. 2 grand daughters, 1 great granddaughter who is 1yo.    Social Drivers of Health   Tobacco Use: Medium Risk (09/30/2024)   Patient History    Smoking Tobacco Use: Former    Smokeless Tobacco Use: Never    Passive Exposure: Never  Physicist, Medical Strain: Low Risk (06/06/2024)   Overall Financial Resource Strain (CARDIA)    Difficulty of Paying Living Expenses: Not hard at all  Food Insecurity: No Food Insecurity (06/06/2024)   Epic    Worried About Programme Researcher, Broadcasting/film/video in the Last Year: Never true    Ran Out of Food in the Last Year: Never true  Transportation Needs: No Transportation Needs (06/06/2024)   Epic    Lack of Transportation (Medical): No    Lack of Transportation (Non-Medical): No  Physical Activity: Insufficiently Active (06/06/2024)   Exercise Vital Sign    Days of Exercise per Week: 2 days    Minutes of Exercise per Session: 60 min  Stress: No Stress Concern Present (06/06/2024)   [employer] of Occupational Health - Occupational Stress Questionnaire    Feeling of Stress: Not at all  Social Connections: Socially Isolated (06/06/2024)   Social Connection and Isolation Panel    Frequency of Communication with Friends and Family: More than three times a week    Frequency of Social Gatherings with Friends and Family: More than three times a week    Attends Religious Services: Never    Database Administrator or Organizations: No    Attends Banker Meetings: Never    Marital Status: Widowed  Intimate Partner Violence: Not At Risk (06/06/2024)   Epic    Fear of Current or Ex-Partner: No    Emotionally Abused: No    Physically Abused: No    Sexually Abused: No  Depression (PHQ2-9): Low Risk (10/20/2024)   Depression (PHQ2-9)    PHQ-2 Score: 0  Alcohol Screen: Low Risk (06/06/2024)   Alcohol Screen    Last Alcohol Screening Score  (AUDIT): 0  Housing: Unknown (06/06/2024)   Epic    Unable to Pay for Housing in the Last Year: No    Number of Times Moved in the Last Year: Not on file    Homeless in the Last Year: No  Utilities: Not At Risk (06/06/2024)   Epic    Threatened with loss of utilities: No  Health Literacy: Not on file  Objective:  Physical Exam: BP 133/64   Pulse 74   Temp 97.9 F (36.6 C)   Ht 5' 5 (1.651 m)   Wt 176 lb 12.8 oz (80.2 kg)   SpO2 97%   BMI 29.42 kg/m    Physical Exam GENERAL: Alert, cooperative, well developed, no acute distress HEENT: Normocephalic, normal oropharynx, moist mucous membranes CHEST: Clear to auscultation bilaterally, sounds consistent with previous exams, no wheezes, rhonchi, or crackles CARDIOVASCULAR: Normal heart rate and rhythm, S1 and S2 normal without murmurs ABDOMEN: Soft, non-tender, non-distended, without organomegaly, normal bowel sounds EXTREMITIES: No cyanosis or edema NEUROLOGICAL: Cranial nerves grossly intact, moves all extremities without gross motor or sensory deficit   Physical Exam  CT Head Wo Contrast Result Date: 09/30/2024 EXAM: CT HEAD WITHOUT CONTRAST 09/30/2024 12:54:08 AM TECHNIQUE: CT of the head was performed without the administration of intravenous contrast. Automated exposure control, iterative reconstruction, and/or weight based adjustment of the mA/kV was utilized to reduce the radiation dose to as low as reasonably achievable. COMPARISON: ct head 10/19/21 CLINICAL HISTORY: Headache, new onset (Age >= 51y) FINDINGS: BRAIN AND VENTRICLES: No acute hemorrhage. No evidence of acute infarct. No hydrocephalus. No extra-axial collection. No mass effect or midline shift. Patchy and confluent areas of decreased attenuation are noted throughout the deep and periventricular white matter of the cerebral hemispheres bilaterally suggestive of chronic  microvascular ischemic changes. Cerebral ventricle sizes are concordant with the degree of cerebral volume loss. Atherosclerotic calcifications are present within the cavernous internal carotid arteries. ORBITS: No acute abnormality. SINUSES: No acute abnormality. SOFT TISSUES AND SKULL: No acute soft tissue abnormality. No skull fracture. IMPRESSION: 1. No acute intracranial abnormality. Electronically signed by: Morgane Naveau MD 09/30/2024 01:01 AM EST RP Workstation: [id number]   DG Chest Port 1 View if patient is in a treatment room. Result Date: 09/30/2024 EXAM: 1 VIEW(S) XRAY OF THE CHEST 09/30/2024 12:13:00 AM COMPARISON: 07/28/2024 CLINICAL HISTORY: Suspected Sepsis FINDINGS: LUNGS AND PLEURA: No focal pulmonary opacity. No pleural effusion. No pneumothorax. HEART AND MEDIASTINUM: Atherosclerotic calcifications. No acute abnormality of the cardiac and mediastinal silhouettes. 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 Degenerative changes of bilateral shoulders. No acute osseous abnormality. IMPRESSION: 1. No acute cardiopulmonary process. 2. Atherosclerotic calcifications. 3. 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 4. Degenerative changes of the bilateral shoulders. Electronically signed by: Dorethia Molt MD 09/30/2024 12:18 AM EST RP Workstation: [id number]    Recent Results (from the past 2160 hours)  CBC with Differential (Cancer Center Only)     Status: Abnormal   Collection Time: 08/19/24  2:18 PM  Result Value Ref Range   WBC Count 6.3 4.0 - 10.5 K/uL   RBC 3.72 (L) 3.87 - 5.11 MIL/uL   Hemoglobin 10.0 (L) 12.0 - 15.0 g/dL   HCT 69.1 (L) 63.9 - 53.9 %   MCV 82.8 80.0 - 100.0 fL   MCH 26.9 26.0 - 34.0 pg   MCHC 32.5 30.0 - 36.0 g/dL   RDW 84.9 88.4 - 84.4 %   Platelet Count 233 150 - 400 K/uL   nRBC 0.0 0.0 - 0.2 %   Neutrophils Relative % 67 %   Neutro Abs 4.3 1.7 - 7.7 K/uL   Lymphocytes Relative 22 %   Lymphs Abs 1.4 0.7 -  4.0 K/uL   Monocytes Relative 6 %   Monocytes Absolute 0.4 0.1 - 1.0 K/uL   Eosinophils Relative 4 %   Eosinophils Absolute 0.2 0.0 - 0.5 K/uL  Basophils Relative 1 %   Basophils Absolute 0.0 0.0 - 0.1 K/uL   Immature Granulocytes 0 %   Abs Immature Granulocytes 0.01 0.00 - 0.07 K/uL    Comment: Performed at [facility], [address] [place], [place] [address]  CMP (Cancer Center only)     Status: Abnormal   Collection Time: 08/19/24  2:18 PM  Result Value Ref Range   Sodium 137 135 - 145 mmol/L   Potassium 4.3 3.5 - 5.1 mmol/L   Chloride 103 98 - 111 mmol/L   CO2 24 22 - 32 mmol/L   Glucose, Bld 117 (H) 70 - 99 mg/dL    Comment: Glucose reference range applies only to samples taken after fasting for at least 8 hours.   BUN 18 8 - 23 mg/dL   Creatinine 8.86 (H) 9.55 - 1.00 mg/dL   Calcium  9.7 8.9 - 10.3 mg/dL   Total Protein 6.9 6.5 - 8.1 g/dL   Albumin 4.3 3.5 - 5.0 g/dL   AST 28 15 - 41 U/L   ALT 22 0 - 44 U/L   Alkaline Phosphatase 51 38 - 126 U/L   Total Bilirubin 0.3 0.0 - 1.2 mg/dL   GFR, Estimated 48 (L) >60 mL/min    Comment: (NOTE) Calculated using the CKD-EPI Creatinine Equation (2021)    Anion gap 10 5 - 15    Comment: Performed at [facility], [address] [place], [place] [address]  Iron and Iron Binding Capacity ([facility] only)     Status: None   Collection Time: 08/19/24  2:18 PM  Result Value Ref Range   Iron 92 28 - 170 ug/dL   TIBC 643 749 - 549 ug/dL   Saturation Ratios 26 10.4 - 31.8 %   UIBC 264 ug/dL    Comment: Performed at [facility], [address] [place], [place] [address]  Ferritin     Status: None   Collection Time: 08/19/24  2:19 PM  Result Value Ref Range   Ferritin 82 11 - 307 ng/mL    Comment: Performed at [facility], [address] [place], [place] [address]  CBC     Status: Abnormal   Collection Time: 09/22/24  2:55 PM  Result Value Ref Range   WBC 10.5 4.0 - 10.5 K/uL    RBC 3.85 (L) 3.87 - 5.11 MIL/uL   Hemoglobin 10.4 (L) 12.0 - 15.0 g/dL   HCT 68.1 (L) 63.9 - 53.9 %   MCV 82.6 80.0 - 100.0 fL   MCH 27.0 26.0 - 34.0 pg   MCHC 32.7 30.0 - 36.0 g/dL   RDW 85.0 88.4 - 84.4 %   Platelets 245 150 - 400 K/uL   nRBC 0.0 0.0 - 0.2 %    Comment: Performed at [facility], [address] [place], [place] [address]  CMP (Cancer Center only)     Status: Abnormal   Collection Time: 09/22/24  2:55 PM  Result Value Ref Range   Sodium 137 135 - 145 mmol/L   Potassium 4.2 3.5 - 5.1 mmol/L   Chloride 101 98 - 111 mmol/L   CO2 27 22 - 32 mmol/L   Glucose, Bld 100 (H) 70 - 99 mg/dL    Comment: Glucose reference range applies only to samples taken after fasting for at least 8 hours.   BUN 18 8 - 23 mg/dL   Creatinine 8.95 (H) 9.55 - 1.00 mg/dL  Calcium  9.2 8.9 - 10.3 mg/dL   Total Protein 6.7 6.5 - 8.1 g/dL   Albumin 4.3 3.5 - 5.0 g/dL   AST 32 15 - 41 U/L   ALT 24 0 - 44 U/L   Alkaline Phosphatase 58 38 - 126 U/L   Total Bilirubin 0.3 0.0 - 1.2 mg/dL   GFR, Estimated 53 (L) >60 mL/min    Comment: (NOTE) Calculated using the CKD-EPI Creatinine Equation (2021)    Anion gap 9 5 - 15    Comment: Performed at [facility], [address] [place], [place] [address]  CBG monitoring, ED     Status: Abnormal   Collection Time: 09/29/24 11:57 PM  Result Value Ref Range   Glucose-Capillary 116 (H) 70 - 99 mg/dL    Comment: Glucose reference range applies only to samples taken after fasting for at least 8 hours.  Culture, blood (Routine x 2)     Status: None   Collection Time: 09/30/24 12:00 AM   Specimen: BLOOD  Result Value Ref Range   Specimen Description      BLOOD RIGHT ANTECUBITAL Performed at [facility], [address] [place], [place] [address]    Special Requests      BOTTLES DRAWN AEROBIC AND ANAEROBIC Blood Culture adequate volume Performed at [facility], [address] [place], [place] [address]     Culture      NO GROWTH 5 DAYS Performed at [facility], [address], [place], [place] [address]    Report Status 10/05/2024 FINAL   Culture, blood (Routine x 2)     Status: None   Collection Time: 09/30/24 12:00 AM   Specimen: BLOOD  Result Value Ref Range   Specimen Description      BLOOD LEFT ANTECUBITAL Performed at [facility], [address] [place], [place] [address]    Special Requests      BOTTLES DRAWN AEROBIC AND ANAEROBIC Blood Culture adequate volume Performed at [facility], [address] [place], [place] [address]    Culture      NO GROWTH 5 DAYS Performed at [facility], [address] [place], [place] [address]    Report Status 10/05/2024 FINAL   Comprehensive metabolic panel     Status: Abnormal   Collection Time: 09/30/24 12:03 AM  Result Value Ref Range   Sodium 134 (L) 135 - 145 mmol/L   Potassium 4.4 3.5 - 5.1 mmol/L   Chloride 100 98 - 111 mmol/L   CO2 24 22 - 32 mmol/L   Glucose, Bld 114 (H) 70 - 99 mg/dL    Comment: Glucose reference range applies only to samples taken after fasting for at least 8 hours.   BUN 25 (H) 8 - 23 mg/dL   Creatinine, Ser 8.61 (H) 0.44 - 1.00 mg/dL   Calcium  9.3 8.9 - 10.3 mg/dL   Total Protein 6.7 6.5 - 8.1 g/dL   Albumin 4.2 3.5 - 5.0 g/dL   AST 26 15 - 41 U/L   ALT 18 0 - 44 U/L   Alkaline Phosphatase 53 38 - 126 U/L   Total Bilirubin 0.4 0.0 - 1.2 mg/dL   GFR, Estimated 38 (L) >60 mL/min    Comment: (NOTE) Calculated using the CKD-EPI Creatinine Equation (2021)    Anion gap 11 5 - 15    Comment: Performed at [facility], [address] [place], [place] [address]  Lactic acid, plasma     Status: None   Collection Time: 09/30/24 12:03 AM  Result Value Ref Range   Lactic Acid, Venous 1.2 0.5 - 1.9 mmol/L    Comment: Performed at [facility], [address] [place], [place] [address]  CBC with Differential     Status: Abnormal   Collection  Time: 09/30/24 12:03 AM  Result Value Ref Range   WBC 17.7 (H) 4.0 - 10.5 K/uL   RBC 3.81 (L) 3.87 - 5.11 MIL/uL   Hemoglobin 10.3 (L) 12.0 - 15.0 g/dL   HCT 68.6 (L) 63.9 - 53.9 %   MCV 82.2 80.0 - 100.0 fL   MCH 27.0 26.0 - 34.0 pg   MCHC 32.9 30.0 - 36.0 g/dL   RDW 84.4 88.4 - 84.4 %   Platelets 249 150 - 400 K/uL   nRBC 0.0 0.0 - 0.2 %   Neutrophils Relative % 87 %   Neutro Abs 15.3 (H) 1.7 - 7.7 K/uL   Lymphocytes Relative 4 %   Lymphs Abs 0.8 0.7 - 4.0 K/uL   Monocytes Relative 8 %   Monocytes Absolute 1.5 (H) 0.1 - 1.0 K/uL   Eosinophils Relative 0 %   Eosinophils Absolute 0.1 0.0 - 0.5 K/uL   Basophils Relative 0 %   Basophils Absolute 0.0 0.0 - 0.1 K/uL   Immature Granulocytes 1 %   Abs Immature Granulocytes 0.08 (H) 0.00 - 0.07 K/uL    Comment: Performed at [facility], [address] [place], [place] [address]  Urinalysis, w/ Reflex to Culture (Infection Suspected) -Urine, Clean Catch     Status: Abnormal   Collection Time: 09/30/24 12:06 AM  Result Value Ref Range   Specimen Source URINE, CATHETERIZED    Color, Urine YELLOW YELLOW   APPearance CLOUDY (A) CLEAR   Specific Gravity, Urine 1.015 1.005 - 1.030   pH 6.0 5.0 - 8.0   Glucose, UA NEGATIVE NEGATIVE mg/dL   Hgb urine dipstick TRACE (A) NEGATIVE   Bilirubin Urine NEGATIVE NEGATIVE   Ketones, ur NEGATIVE NEGATIVE mg/dL   Protein, ur NEGATIVE NEGATIVE mg/dL   Nitrite NEGATIVE NEGATIVE   Leukocytes,Ua MODERATE (A) NEGATIVE   Squamous Epithelial / HPF 0-5 0 - 5 /HPF   WBC, UA >50 0 - 5 WBC/hpf    Comment: Reflex urine culture not performed if WBC <=10, OR if Squamous epithelial cells >5. If Squamous epithelial cells >5, suggest recollection.   RBC / HPF 0-5 0 - 5 RBC/hpf   Bacteria, UA FEW (A) NONE SEEN   Mucus PRESENT    Budding Yeast PRESENT     Comment: Performed at [facility], [address] [place], [place] [address]  Resp panel by RT-PCR (RSV, Flu A&B, Covid) Anterior Nasal  Swab     Status: None   Collection Time: 09/30/24 12:06 AM   Specimen: Anterior Nasal Swab  Result Value Ref Range   SARS Coronavirus 2 by RT PCR NEGATIVE NEGATIVE    Comment: (NOTE) SARS-CoV-2 target nucleic acids are NOT DETECTED.  The SARS-CoV-2 RNA is generally detectable in upper respiratory specimens during the acute phase of infection. The lowest concentration of SARS-CoV-2 viral copies this assay can detect is 138 copies/mL. A negative result does not preclude SARS-Cov-2 infection and should not be used as the sole basis for treatment or other patient management decisions. A negative result may occur with  improper  specimen collection/handling, submission of specimen other than nasopharyngeal swab, presence of viral mutation(s) within the areas targeted by this assay, and inadequate number of viral copies(<138 copies/mL). A negative result must be combined with clinical observations, patient history, and epidemiological information. The expected result is Negative.  Fact Sheet for Patients:  bloggercourse.com  Fact Sheet for Healthcare Providers:  seriousbroker.it  This test is no t yet approved or cleared by the [place]  FDA and  has been authorized for detection and/or diagnosis of SARS-CoV-2 by FDA under an Emergency Use Authorization (EUA). This EUA will remain  in effect (meaning this test can be used) for the duration of the COVID-19 declaration under Section 564(b)(1) of the Act, 21 U.S.C.section 360bbb-3(b)(1), unless the authorization is terminated  or revoked sooner.       Influenza A by PCR NEGATIVE NEGATIVE   Influenza B by PCR NEGATIVE NEGATIVE    Comment: (NOTE) The Xpert Xpress SARS-CoV-2/FLU/RSV plus assay is intended as an aid in the diagnosis of influenza from Nasopharyngeal swab specimens and should not be used as a sole basis for treatment. Nasal washings and aspirates are unacceptable for  Xpert Xpress SARS-CoV-2/FLU/RSV testing.  Fact Sheet for Patients: bloggercourse.com  Fact Sheet for Healthcare Providers: seriousbroker.it  This test is not yet approved or cleared by the [place]  FDA and has been authorized for detection and/or diagnosis of SARS-CoV-2 by FDA under an Emergency Use Authorization (EUA). This EUA will remain in effect (meaning this test can be used) for the duration of the COVID-19 declaration under Section 564(b)(1) of the Act, 21 U.S.C. section 360bbb-3(b)(1), unless the authorization is terminated or revoked.     Resp Syncytial Virus by PCR NEGATIVE NEGATIVE    Comment: (NOTE) Fact Sheet for Patients: bloggercourse.com  Fact Sheet for Healthcare Providers: seriousbroker.it  This test is not yet approved or cleared by the [place]  FDA and has been authorized for detection and/or diagnosis of SARS-CoV-2 by FDA under an Emergency Use Authorization (EUA). This EUA will remain in effect (meaning this test can be used) for the duration of the COVID-19 declaration under Section 564(b)(1) of the Act, 21 U.S.C. section 360bbb-3(b)(1), unless the authorization is terminated or revoked.  Performed at [facility], [address] [place], [place] [address]   Urine Culture     Status: Abnormal   Collection Time: 09/30/24 12:06 AM   Specimen: Urine, Random  Result Value Ref Range   Specimen Description      URINE, RANDOM Performed at [facility], [address] [place], [place] [address]    Special Requests      NONE Reflexed from [id number] Performed at [facility], [address] [place], [place] [address]    Culture >=100,000 COLONIES/mL ENTEROCOCCUS FAECALIS (A)    Report Status 10/02/2024 FINAL    Organism ID, Bacteria ENTEROCOCCUS FAECALIS (A)       Susceptibility   Enterococcus faecalis - MIC*     AMPICILLIN <=2 SENSITIVE Sensitive     NITROFURANTOIN <=16 SENSITIVE Sensitive     VANCOMYCIN  1 SENSITIVE Sensitive     * >=100,000 COLONIES/mL ENTEROCOCCUS FAECALIS  CBC     Status: Abnormal   Collection Time: 10/20/24  2:57 PM  Result Value Ref Range   WBC 6.3 4.0 - 10.5 K/uL   RBC 3.92 3.87 - 5.11 MIL/uL   Hemoglobin 10.2 (L) 12.0 - 15.0 g/dL   HCT 68.2 (L) 63.9 - 53.9 %  MCV 80.9 80.0 - 100.0 fL   MCH 26.0 26.0 - 34.0 pg   MCHC 32.2 30.0 - 36.0 g/dL   RDW 84.6 88.4 - 84.4 %   Platelets 320 150 - 400 K/uL   nRBC 0.0 0.0 - 0.2 %    Comment: Performed at [facility], [address] [place], [place] [address]  CMP (Cancer Center only)     Status: Abnormal   Collection Time: 10/20/24  2:57 PM  Result Value Ref Range   Sodium 133 (L) 135 - 145 mmol/L   Potassium 5.0 3.5 - 5.1 mmol/L   Chloride 98 98 - 111 mmol/L   CO2 26 22 - 32 mmol/L   Glucose, Bld 91 70 - 99 mg/dL    Comment: Glucose reference range applies only to samples taken after fasting for at least 8 hours.   BUN 18 8 - 23 mg/dL   Creatinine 8.82 (H) 9.55 - 1.00 mg/dL   Calcium  9.7 8.9 - 10.3 mg/dL   Total Protein 6.6 6.5 - 8.1 g/dL   Albumin 4.2 3.5 - 5.0 g/dL   AST 30 15 - 41 U/L   ALT 21 0 - 44 U/L   Alkaline Phosphatase 51 38 - 126 U/L   Total Bilirubin 0.2 0.0 - 1.2 mg/dL   GFR, Estimated 46 (L) >60 mL/min    Comment: (NOTE) Calculated using the CKD-EPI Creatinine Equation (2021)    Anion gap 10 5 - 15    Comment: Performed at [facility], [address] [place], [place] [address]        Beverley Adine Hummer, MD, MS "

## 2024-11-14 ENCOUNTER — Ambulatory Visit: Admitting: Family Medicine

## 2024-11-15 ENCOUNTER — Encounter: Payer: Self-pay | Admitting: Family

## 2024-11-18 ENCOUNTER — Other Ambulatory Visit: Payer: Self-pay | Admitting: Medical Oncology

## 2024-11-18 DIAGNOSIS — D631 Anemia in chronic kidney disease: Secondary | ICD-10-CM

## 2024-11-18 DIAGNOSIS — D509 Iron deficiency anemia, unspecified: Secondary | ICD-10-CM

## 2024-11-19 ENCOUNTER — Encounter: Payer: Self-pay | Admitting: Medical Oncology

## 2024-11-19 ENCOUNTER — Inpatient Hospital Stay

## 2024-11-19 ENCOUNTER — Inpatient Hospital Stay: Attending: Hematology & Oncology | Admitting: Medical Oncology

## 2024-11-19 VITALS — BP 126/63 | HR 73 | Temp 97.7°F | Resp 20 | Ht 65.0 in | Wt 178.0 lb

## 2024-11-19 DIAGNOSIS — N1831 Chronic kidney disease, stage 3a: Secondary | ICD-10-CM | POA: Diagnosis present

## 2024-11-19 DIAGNOSIS — D631 Anemia in chronic kidney disease: Secondary | ICD-10-CM | POA: Diagnosis not present

## 2024-11-19 DIAGNOSIS — D509 Iron deficiency anemia, unspecified: Secondary | ICD-10-CM | POA: Diagnosis not present

## 2024-11-19 LAB — CBC
HCT: 33.1 % — ABNORMAL LOW (ref 36.0–46.0)
Hemoglobin: 10.6 g/dL — ABNORMAL LOW (ref 12.0–15.0)
MCH: 25.8 pg — ABNORMAL LOW (ref 26.0–34.0)
MCHC: 32 g/dL (ref 30.0–36.0)
MCV: 80.5 fL (ref 80.0–100.0)
Platelets: 287 10*3/uL (ref 150–400)
RBC: 4.11 MIL/uL (ref 3.87–5.11)
RDW: 15.9 % — ABNORMAL HIGH (ref 11.5–15.5)
WBC: 7.2 10*3/uL (ref 4.0–10.5)
nRBC: 0 % (ref 0.0–0.2)

## 2024-11-19 LAB — CMP (CANCER CENTER ONLY)
ALT: 23 U/L (ref 0–44)
AST: 30 U/L (ref 15–41)
Albumin: 4.4 g/dL (ref 3.5–5.0)
Alkaline Phosphatase: 47 U/L (ref 38–126)
Anion gap: 10 (ref 5–15)
BUN: 23 mg/dL (ref 8–23)
CO2: 25 mmol/L (ref 22–32)
Calcium: 9.8 mg/dL (ref 8.9–10.3)
Chloride: 99 mmol/L (ref 98–111)
Creatinine: 1.15 mg/dL — ABNORMAL HIGH (ref 0.44–1.00)
GFR, Estimated: 47 mL/min — ABNORMAL LOW
Glucose, Bld: 97 mg/dL (ref 70–99)
Potassium: 5 mmol/L (ref 3.5–5.1)
Sodium: 134 mmol/L — ABNORMAL LOW (ref 135–145)
Total Bilirubin: 0.3 mg/dL (ref 0.0–1.2)
Total Protein: 7 g/dL (ref 6.5–8.1)

## 2024-11-19 MED ORDER — EPOETIN ALFA-EPBX 40000 UNIT/ML IJ SOLN
40000.0000 [IU] | Freq: Once | INTRAMUSCULAR | Status: AC
  Administered 2024-11-19: 40000 [IU] via SUBCUTANEOUS
  Filled 2024-11-19: qty 1

## 2024-11-19 NOTE — Progress Notes (Signed)
 "  New Patient Pulmonology Office Visit   Subjective:  Patient ID: Kim Grant, female    DOB: [birth year]  MRN: [id number]  Referred by: Adrien Guan, Tamala, MD  CC: No chief complaint on file.   HPI Kim Grant is a 84 y.o. female with history of smoking  (quitted 11 years ago, 1/2 ppd), bronchitis, AS, who came for evaluation of dyspnea on exertion. She has aortic stenosis with some dyspnea and dizziness.  She had a remote smoking history. Quitted at age 41 yo, 1/2ppd.  Interval History: -Patient reported to feel well since she started using the stiolto.   Patient reported to have dyspnea on exertion 4 to 5 months ago.  It is more pronounced when she walks uphill.  She is able to do her biking and the step for 15 minutes without difficulty.   -She had bronchitis 2 weeks ago and received Augmentin  and azithromycin , prednisone with resolution of her symptoms.  She denied multiple flares requiring antibiotics in the past. -She has some occasional wheezing. -She does not use the albuterol  rescue inhaler.   ROS as above  Allergies: Patient has no known allergies.  Current Outpatient Medications:    albuterol  (VENTOLIN  HFA) 108 (90 Base) MCG/ACT inhaler, Inhale 2 puffs into the lungs every 6 (six) hours as needed for wheezing or shortness of breath., Disp: 1 g, Rfl: 4   Apoaequorin (PREVAGEN PO), Take by mouth., Disp: , Rfl:    calcium  citrate-vitamin D  (CITRACAL+D) 315-200 MG-UNIT per tablet, Take 3 tablets by mouth daily., Disp: , Rfl:    cephALEXin  (KEFLEX ) 500 MG capsule, Take 1 capsule (500 mg total) by mouth 3 (three) times daily., Disp: 21 capsule, Rfl: 0   citalopram  (CELEXA ) 40 MG tablet, TAKE 1 TABLET BY MOUTH EVERY DAY, Disp: 90 tablet, Rfl: 3   denosumab  (PROLIA ) 60 MG/ML SOSY injection, Inject 60 mg into the skin every 6 (six) months., Disp: , Rfl:    esomeprazole  (NEXIUM ) 40 MG capsule, TAKE 1 CAPSULE BY MOUTH DAILY, Disp: 90 capsule, Rfl: 0   famotidine (PEPCID) 40 MG  tablet, Take 40 mg by mouth daily., Disp: , Rfl:    fluticasone  (FLONASE ) 50 MCG/ACT nasal spray, Place 2 sprays into both nostrils daily., Disp: 16 g, Rfl: 6   latanoprost (XALATAN) 0.005 % ophthalmic solution, Place 1 drop into the right eye at bedtime., Disp: , Rfl:    meclizine  (ANTIVERT ) 25 MG tablet, Take 1 tablet (25 mg total) by mouth 3 (three) times daily as needed for dizziness., Disp: 15 tablet, Rfl: 0   melatonin 5 MG TABS, Take 5 mg by mouth at bedtime as needed., Disp: , Rfl:    Multiple Vitamin (MULTIVITAMIN WITH MINERALS) TABS tablet, Take 1 tablet by mouth daily., Disp: , Rfl:    rosuvastatin  (CRESTOR ) 40 MG tablet, TAKE 1 TABLET BY MOUTH EVERY DAY FOR CHOLESTEROL, Disp: 90 tablet, Rfl: 3 No current facility-administered medications for this visit.  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 HPI:  Complains of stopped  up Bilateral ear(s). EXAM: shows cerumen impaction. PLAN: Cerumen removed with various instruments (curettes, suction) giving subjective relief.  External canals and tympanic membranes are otherwise normal.  Return as needed.   Chronic bronchitis (HCC) 08/04/2014   Closed fracture of nasal bone with routine healing 11/02/2021   Last Assessment & Plan:  Formatting of this note might be different from the original. Nasal fracture. Fell with facial trauma about 3 weeks ago.  CT of the face is reviewed and shows minimally displaced nasal  fracture.  Denies any nasal obstruction. EXAM shows mild deviation of the nasal dorsum.  Subtle.  Intranasal exam is unremarkable. 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   Past Surgical History:  Procedure Laterality Date   ABDOMINAL HYSTERECTOMY     BREAST SURGERY     KNEE SURGERY Right    ROTATOR CUFF REPAIR     Family History  Problem Relation Age of Onset   Cancer Mother        ovarian    Heart disease Father    Social History   Socioeconomic History   Marital status: Widowed    Spouse name: Not on file   Number of children: Not on file   Years of education: Not on file   Highest education level: Not on file  Occupational History   Not on file  Tobacco Use   Smoking status: Former    Current packs/day: 0.00    Average packs/day: 0.5 packs/day for 15.0 years (7.5 ttl pk-yrs)    Types: Cigarettes    Start date: 02/28/1998    Quit date: 02/28/2013    Years since quitting: 11.7    Passive exposure: Never   Smokeless tobacco: Never  Vaping Use   Vaping status: Never Used  Substance and Sexual Activity   Alcohol use: No   Drug use: No   Sexual activity: Not Currently  Other Topics Concern   Not on file  Social History Narrative   Pt works part time at [employer] hospital in [place] x 18 years. She is widowed, has 2 grown sons. 2 grand daughters, 1 great granddaughter who is 1yo.    Social Drivers of Health   Tobacco Use: Medium Risk (11/19/2024)   Patient History     Smoking Tobacco Use: Former    Smokeless Tobacco Use: Never    Passive Exposure: Never  Physicist, Medical Strain: Low Risk (06/06/2024)   Overall Financial Resource Strain (CARDIA)    Difficulty of Paying Living Expenses: Not hard at all  Food Insecurity: No Food Insecurity (06/06/2024)   Epic    Worried About Programme Researcher, Broadcasting/film/video in the Last Year: Never true    Ran Out of Food in the Last Year: Never true  Transportation Needs: No Transportation Needs (06/06/2024)   Epic    Lack of Transportation (Medical): No    Lack of Transportation (Non-Medical): No  Physical Activity: Insufficiently Active (06/06/2024)   Exercise Vital Sign  Days of Exercise per Week: 2 days    Minutes of Exercise per Session: 60 min  Stress: No Stress Concern Present (06/06/2024)   [employer] of Occupational Health - Occupational Stress Questionnaire    Feeling of Stress: Not at all  Social Connections: Socially Isolated (06/06/2024)   Social Connection and Isolation Panel    Frequency of Communication with Friends and Family: More than three times a week    Frequency of Social Gatherings with Friends and Family: More than three times a week    Attends Religious Services: Never    Database Administrator or Organizations: No    Attends Banker Meetings: Never    Marital Status: Widowed  Intimate Partner Violence: Not At Risk (06/06/2024)   Epic    Fear of Current or Ex-Partner: No    Emotionally Abused: No    Physically Abused: No    Sexually Abused: No  Depression (PHQ2-9): Low Risk (10/20/2024)   Depression (PHQ2-9)    PHQ-2 Score: 0  Alcohol Screen: Low Risk (06/06/2024)   Alcohol Screen    Last Alcohol Screening Score (AUDIT): 0  Housing: Unknown (06/06/2024)   Epic    Unable to Pay for Housing in the Last Year: No    Number of Times Moved in the Last Year: Not on file    Homeless in the Last Year: No  Utilities: Not At Risk (06/06/2024)   Epic    Threatened with loss of  utilities: No  Health Literacy: Not on file    Social hx: smoking  (quitted 11 years ago, 1/2 ppd)    Objective:  There were no vitals taken for this visit. Wt Readings from Last 3 Encounters:  11/19/24 178 lb (80.7 kg)  11/13/24 176 lb 12.8 oz (80.2 kg)  08/20/24 177 lb 12.8 oz (80.6 kg)   SpO2 Readings from Last 3 Encounters:  11/19/24 94%  11/13/24 97%  10/20/24 96%    Physical Exam Constitutional:      Appearance: Normal appearance.  HENT:     Mouth/Throat:     Mouth: Mucous membranes are dry.  Cardiovascular:     Rate and Rhythm: Normal rate and regular rhythm.  Pulmonary:     Effort: Pulmonary effort is normal.     Breath sounds: Normal breath sounds.  Musculoskeletal:        General: Normal range of motion.     Comments: No LE edema  Neurological:     General: No focal deficit present.     Mental Status: She is alert and oriented to person, place, and time.    Diagnostic Review:   CT chest wo con 07/24/2024 1. Mild emphysema and diffuse bilateral bronchial wall thickening. 2. Small hiatal hernia. 3. Aortic valve calcifications. Correlate for echocardiographic evidence of aortic valve dysfunction     Assessment & Plan:   Centrilobular emphysema 2.   COPD 3.   Former smoker 4.   AS  84 yo with dyspnea on exertion 4-5 months ago. History of COPD (on records spirometry has shown mild obstruction) without long term inhaler. She a recent bronchitis treated with antibiotics + prednisone with resolution of her symptoms.  Patient was started on Stiolto, but she has not started yet. If symptoms have not resolved she may need an evaluation of her AS.  Plan: -Start Stiolto -PFTs -Consideration for flutter valve if bronchitis continue to be a problem. -Pending cardiology evaluation for AS.  No follow-ups on file.  Marny Patch, MD Pulmonary and Critical Care Medicine [facility]  "

## 2024-11-19 NOTE — Patient Instructions (Signed)

## 2024-11-19 NOTE — Progress Notes (Signed)
 " Hematology and Oncology Follow Up Visit  Kim Grant [id number] Jun 27, 1941 84 y.o. 11/19/2024   Principle Diagnosis:  Erythropoietin  deficiency anemia    Current Therapy:        Retacrit  40,000 units SQ for Hgb < 11               Interim History:  Kim Grant is here today for follow-up and injection.   She reports doing well. She has no concerns. She is seeing pulmonology tomorrow to discuss her mild COPD.  No blood loss, bruising or petechiae noted.  No fever, chills, n/v, cough, rash, SOB, chest pain, palpitations, abdominal pain or changes in bowel or bladder habits.  No swelling in her extremities.  No falls or syncope reported.  Appetite and hydration ar good.   Wt Readings from Last 3 Encounters:  11/19/24 178 lb (80.7 kg)  11/13/24 176 lb 12.8 oz (80.2 kg)  08/20/24 177 lb 12.8 oz (80.6 kg)   ECOG Performance Status: 1 - Symptomatic but completely ambulatory  Medications:  Allergies as of 11/19/2024   No Known Allergies      Medication List        Accurate as of November 19, 2024  2:45 PM. If you have any questions, ask your nurse or doctor.          albuterol  108 (90 Base) MCG/ACT inhaler Commonly known as: VENTOLIN  HFA Inhale 2 puffs into the lungs every 6 (six) hours as needed for wheezing or shortness of breath.   calcium  citrate-vitamin D  315-200 MG-UNIT tablet Commonly known as: CITRACAL+D Take 3 tablets by mouth daily.   cephALEXin  500 MG capsule Commonly known as: KEFLEX  Take 1 capsule (500 mg total) by mouth 3 (three) times daily.   citalopram  40 MG tablet Commonly known as: CELEXA  TAKE 1 TABLET BY MOUTH EVERY DAY   denosumab  60 MG/ML Sosy injection Commonly known as: PROLIA  Inject 60 mg into the skin every 6 (six) months.   esomeprazole  40 MG capsule Commonly known as: NEXIUM  TAKE 1 CAPSULE BY MOUTH DAILY   famotidine 40 MG tablet Commonly known as: PEPCID Take 40 mg by mouth daily.   fluticasone  50 MCG/ACT nasal spray Commonly  known as: FLONASE  Place 2 sprays into both nostrils daily.   latanoprost 0.005 % ophthalmic solution Commonly known as: XALATAN Place 1 drop into the right eye at bedtime.   meclizine  25 MG tablet Commonly known as: ANTIVERT  Take 1 tablet (25 mg total) by mouth 3 (three) times daily as needed for dizziness.   melatonin 5 MG Tabs Take 5 mg by mouth at bedtime as needed.   multivitamin with minerals Tabs tablet Take 1 tablet by mouth daily.   PREVAGEN PO Take by mouth.   rosuvastatin  40 MG tablet Commonly known as: CRESTOR  TAKE 1 TABLET BY MOUTH EVERY DAY FOR CHOLESTEROL        Allergies: No Known Allergies  Past Medical History, Surgical history, Social history, and Family History were reviewed and updated.  Review of Systems: All other 10 point review of systems is negative.   Physical Exam:  height is 5' 5 (1.651 m) and weight is 178 lb (80.7 kg). Her oral temperature is 97.7 F (36.5 C). Her blood pressure is 126/63 and her pulse is 73. Her respiration is 20 and oxygen saturation is 94%.   Wt Readings from Last 3 Encounters:  11/19/24 178 lb (80.7 kg)  11/13/24 176 lb 12.8 oz (80.2 kg)  08/20/24 177 lb 12.8 oz (  80.6 kg)   Constitutional: NAD, no slurred speech  Ocular: Sclerae unicteric, pupils equal, round and reactive to light Ear-nose-throat: Oropharynx clear, dentition fair Lymphatic: No cervical or supraclavicular adenopathy Lungs no rales or rhonchi, good excursion bilaterally Heart regular rate and rhythm, no murmur appreciated Abd soft, nontender, positive bowel sounds MSK no focal spinal tenderness, no joint edema Neuro: non-focal, well-oriented, appropriate affect. Cranial nerves are intact. Normal gait.   Lab Results  Component Value Date   WBC 7.2 11/19/2024   HGB 10.6 (L) 11/19/2024   HCT 33.1 (L) 11/19/2024   MCV 80.5 11/19/2024   PLT 287 11/19/2024   Lab Results  Component Value Date   FERRITIN 82 08/19/2024   IRON 92 08/19/2024    TIBC 356 08/19/2024   UIBC 264 08/19/2024   IRONPCTSAT 26 08/19/2024   Lab Results  Component Value Date   RETICCTPCT 0.9 07/03/2024   RBC 4.11 11/19/2024   No results found for: KPAFRELGTCHN, LAMBDASER, KAPLAMBRATIO No results found for: IGGSERUM, IGA, IGMSERUM No results found for: STEPHANY CARLOTA BENSON MARKEL EARLA JOANNIE DOC, MSPIKE, SPEI   Chemistry      Component Value Date/Time   NA 133 (L) 10/20/2024 1457   K 5.0 10/20/2024 1457   CL 98 10/20/2024 1457   CO2 26 10/20/2024 1457   BUN 18 10/20/2024 1457   CREATININE 1.17 (H) 10/20/2024 1457   CREATININE 1.07 (H) 07/11/2024 1501      Component Value Date/Time   CALCIUM  9.7 10/20/2024 1457   ALKPHOS 51 10/20/2024 1457   AST 30 10/20/2024 1457   ALT 21 10/20/2024 1457   BILITOT 0.2 10/20/2024 1457     Encounter Diagnoses  Name Primary?   Erythropoietin  deficiency anemia Yes   Iron deficiency anemia, unspecified iron deficiency anemia type    Impression and Plan: Kim Grant is a very pleasant 84 yo caucasian female with erythropoietin  deficiency anemia. At times she has had iron deficiency. Most recently on 07/03/2024 her iron studies were normal.   ESA given due to her Hgb being 10.6 Iron studies pending.    Disposition ESA today RTC monthly for lab (CBC) and injection RTC 3 months APP, labs (CBC, CMP) and injection-[place]  Kim Grant, [place] 1/28/20262:45 PM "

## 2024-11-21 ENCOUNTER — Ambulatory Visit

## 2024-11-21 VITALS — BP 130/60 | HR 76 | Temp 97.3°F | Ht 65.0 in

## 2024-11-21 DIAGNOSIS — J432 Centrilobular emphysema: Secondary | ICD-10-CM | POA: Diagnosis not present

## 2024-11-21 DIAGNOSIS — J209 Acute bronchitis, unspecified: Secondary | ICD-10-CM

## 2024-11-21 DIAGNOSIS — Z87891 Personal history of nicotine dependence: Secondary | ICD-10-CM | POA: Diagnosis not present

## 2024-11-21 DIAGNOSIS — J439 Emphysema, unspecified: Secondary | ICD-10-CM

## 2024-11-21 DIAGNOSIS — J449 Chronic obstructive pulmonary disease, unspecified: Secondary | ICD-10-CM | POA: Diagnosis not present

## 2024-11-21 DIAGNOSIS — J440 Chronic obstructive pulmonary disease with acute lower respiratory infection: Secondary | ICD-10-CM

## 2024-11-21 LAB — PULMONARY FUNCTION TEST
DL/VA % pred: 88 %
DL/VA: 3.55 ml/min/mmHg/L
DLCO cor % pred: 69 %
DLCO cor: 13.45 ml/min/mmHg
DLCO unc % pred: 62 %
DLCO unc: 12.13 ml/min/mmHg
FEF 25-75 Post: 1.18 L/s
FEF 25-75 Pre: 0.81 L/s
FEF2575-%Change-Post: 45 %
FEF2575-%Pred-Post: 86 %
FEF2575-%Pred-Pre: 59 %
FEV1-%Change-Post: 9 %
FEV1-%Pred-Post: 80 %
FEV1-%Pred-Pre: 73 %
FEV1-Post: 1.57 L
FEV1-Pre: 1.44 L
FEV1FVC-%Change-Post: 1 %
FEV1FVC-%Pred-Pre: 92 %
FEV6-%Change-Post: 8 %
FEV6-%Pred-Post: 90 %
FEV6-%Pred-Pre: 83 %
FEV6-Post: 2.25 L
FEV6-Pre: 2.08 L
FEV6FVC-%Change-Post: 0 %
FEV6FVC-%Pred-Post: 104 %
FEV6FVC-%Pred-Pre: 103 %
FVC-%Change-Post: 7 %
FVC-%Pred-Post: 86 %
FVC-%Pred-Pre: 80 %
FVC-Post: 2.29 L
FVC-Pre: 2.12 L
Post FEV1/FVC ratio: 69 %
Post FEV6/FVC ratio: 98 %
Pre FEV1/FVC ratio: 68 %
Pre FEV6/FVC Ratio: 98 %
RV % pred: 108 %
RV: 2.7 L
TLC % pred: 101 %
TLC: 5.29 L

## 2024-11-21 MED ORDER — ALBUTEROL SULFATE HFA 108 (90 BASE) MCG/ACT IN AERS: 1.0000 | INHALATION_SPRAY | Freq: Four times a day (QID) | RESPIRATORY_TRACT | 12 refills | Status: AC | PRN

## 2024-11-21 NOTE — Progress Notes (Signed)
 Full pft performed today

## 2024-11-21 NOTE — Patient Instructions (Signed)
 Full pft performed today

## 2024-12-17 ENCOUNTER — Inpatient Hospital Stay

## 2025-01-14 ENCOUNTER — Inpatient Hospital Stay

## 2025-02-11 ENCOUNTER — Ambulatory Visit: Admitting: Family Medicine

## 2025-02-12 ENCOUNTER — Inpatient Hospital Stay

## 2025-02-12 ENCOUNTER — Inpatient Hospital Stay: Admitting: Medical Oncology

## 2025-06-08 ENCOUNTER — Ambulatory Visit
